# Patient Record
Sex: Female | Born: 1955
Health system: Southern US, Community
[De-identification: ages and names within clinical notes are randomized; demographics above are authoritative.]

## PROBLEM LIST (undated history)

## (undated) DIAGNOSIS — M109 Gout, unspecified: Secondary | ICD-10-CM

## (undated) DIAGNOSIS — D759 Disease of blood and blood-forming organs, unspecified: Secondary | ICD-10-CM

## (undated) DIAGNOSIS — G4730 Sleep apnea, unspecified: Secondary | ICD-10-CM

## (undated) DIAGNOSIS — M25559 Pain in unspecified hip: Secondary | ICD-10-CM

## (undated) DIAGNOSIS — N390 Urinary tract infection, site not specified: Secondary | ICD-10-CM

## (undated) DIAGNOSIS — I10 Essential (primary) hypertension: Secondary | ICD-10-CM

## (undated) DIAGNOSIS — E669 Obesity, unspecified: Secondary | ICD-10-CM

## (undated) DIAGNOSIS — E119 Type 2 diabetes mellitus without complications: Secondary | ICD-10-CM

## (undated) DIAGNOSIS — Z9989 Dependence on other enabling machines and devices: Secondary | ICD-10-CM

## (undated) DIAGNOSIS — M25569 Pain in unspecified knee: Secondary | ICD-10-CM

## (undated) DIAGNOSIS — J189 Pneumonia, unspecified organism: Secondary | ICD-10-CM

## (undated) DIAGNOSIS — M1990 Unspecified osteoarthritis, unspecified site: Secondary | ICD-10-CM

## (undated) DIAGNOSIS — T7840XA Allergy, unspecified, initial encounter: Secondary | ICD-10-CM

## (undated) DIAGNOSIS — K760 Fatty (change of) liver, not elsewhere classified: Secondary | ICD-10-CM

## (undated) HISTORY — DX: Pain in unspecified knee: M25.569

## (undated) HISTORY — DX: Allergy, unspecified, initial encounter: T78.40XA

## (undated) HISTORY — DX: Pain in unspecified hip: M25.559

## (undated) HISTORY — DX: Urinary tract infection, site not specified: N39.0

## (undated) HISTORY — PX: DILATION AND CURETTAGE OF UTERUS: SHX78

## (undated) HISTORY — DX: Essential (primary) hypertension: I10

## (undated) HISTORY — DX: Fatty (change of) liver, not elsewhere classified: K76.0

## (undated) HISTORY — DX: Type 2 diabetes mellitus without complications: E11.9

## (undated) HISTORY — DX: Dependence on other enabling machines and devices: Z99.89

## (undated) HISTORY — DX: Obesity, unspecified: E66.9

## (undated) HISTORY — DX: Gout, unspecified: M10.9

## (undated) HISTORY — DX: Unspecified osteoarthritis, unspecified site: M19.90

---

## 1975-02-07 HISTORY — PX: APPENDECTOMY: SHX54

## 1975-02-07 HISTORY — PX: CHOLECYSTECTOMY: SHX55

## 1983-02-07 HISTORY — PX: REDUCTION MAMMAPLASTY: SUR839

## 1983-02-07 HISTORY — PX: OTHER SURGICAL HISTORY: SHX169

## 2001-05-09 ENCOUNTER — Encounter: Payer: Self-pay | Admitting: Family Medicine

## 2006-05-08 ENCOUNTER — Encounter: Payer: Self-pay | Admitting: Family Medicine

## 2006-05-08 DIAGNOSIS — K760 Fatty (change of) liver, not elsewhere classified: Secondary | ICD-10-CM

## 2006-05-08 HISTORY — DX: Fatty (change of) liver, not elsewhere classified: K76.0

## 2006-05-08 LAB — CONVERTED CEMR LAB
ALT: 41 units/L
AST: 49 units/L
Hep A IgM: NEGATIVE
Hep B C IgM: NEGATIVE
Hepatitis B Surface Ag: NEGATIVE
Hgb A1c MFr Bld: 9 %

## 2006-05-24 ENCOUNTER — Encounter: Payer: Self-pay | Admitting: Family Medicine

## 2006-09-05 ENCOUNTER — Ambulatory Visit: Payer: Self-pay | Admitting: Family Medicine

## 2006-09-05 DIAGNOSIS — E7800 Pure hypercholesterolemia, unspecified: Secondary | ICD-10-CM | POA: Insufficient documentation

## 2006-09-05 DIAGNOSIS — D682 Hereditary deficiency of other clotting factors: Secondary | ICD-10-CM | POA: Insufficient documentation

## 2006-09-07 ENCOUNTER — Encounter: Payer: Self-pay | Admitting: Family Medicine

## 2006-09-07 ENCOUNTER — Telehealth (INDEPENDENT_AMBULATORY_CARE_PROVIDER_SITE_OTHER): Payer: Self-pay | Admitting: *Deleted

## 2006-09-07 LAB — CONVERTED CEMR LAB
ALT: 27 units/L (ref 0–35)
Bilirubin, Direct: 0.1 mg/dL (ref 0.0–0.3)
Cholesterol, target level: 200 mg/dL
Cholesterol: 156 mg/dL (ref 0–200)
LDL Goal: 100 mg/dL
Total CHOL/HDL Ratio: 4.3
Total Protein: 7.2 g/dL (ref 6.0–8.3)

## 2006-09-10 ENCOUNTER — Encounter: Payer: Self-pay | Admitting: Family Medicine

## 2006-09-11 ENCOUNTER — Encounter: Payer: Self-pay | Admitting: Family Medicine

## 2006-09-11 ENCOUNTER — Ambulatory Visit: Payer: Self-pay | Admitting: Internal Medicine

## 2006-09-30 ENCOUNTER — Emergency Department (HOSPITAL_COMMUNITY): Admission: EM | Admit: 2006-09-30 | Discharge: 2006-09-30 | Payer: Self-pay | Admitting: Emergency Medicine

## 2006-10-18 ENCOUNTER — Ambulatory Visit: Payer: Self-pay | Admitting: Internal Medicine

## 2006-10-18 ENCOUNTER — Encounter: Payer: Self-pay | Admitting: Family Medicine

## 2006-10-18 DIAGNOSIS — K5730 Diverticulosis of large intestine without perforation or abscess without bleeding: Secondary | ICD-10-CM | POA: Insufficient documentation

## 2006-10-18 LAB — HM COLONOSCOPY: HM Colonoscopy: NORMAL

## 2006-11-06 ENCOUNTER — Emergency Department (HOSPITAL_COMMUNITY): Admission: EM | Admit: 2006-11-06 | Discharge: 2006-11-06 | Payer: Self-pay | Admitting: Emergency Medicine

## 2006-11-12 ENCOUNTER — Encounter: Payer: Self-pay | Admitting: Family Medicine

## 2006-11-13 ENCOUNTER — Ambulatory Visit: Payer: Self-pay | Admitting: Family Medicine

## 2006-11-13 LAB — CONVERTED CEMR LAB
Glucose, Urine, Semiquant: NEGATIVE
pH: 5.5

## 2007-04-12 ENCOUNTER — Ambulatory Visit: Payer: Self-pay | Admitting: Family Medicine

## 2007-04-12 ENCOUNTER — Encounter: Admission: RE | Admit: 2007-04-12 | Discharge: 2007-04-12 | Payer: Self-pay | Admitting: Family Medicine

## 2007-04-12 LAB — CONVERTED CEMR LAB
Nitrite: POSITIVE
Protein, U semiquant: 30
Specific Gravity, Urine: 1.025
WBC Urine, dipstick: NEGATIVE

## 2007-04-12 IMAGING — MG MM DIGITAL SCREENING
5 series · 5 of 5 positions shown · non-contrast
Comparison: none

DG SCREEN MAMMOGRAM BILATERAL
Bilateral CC and MLO view(s) were taken.
Technologist: LEI

DIGITAL SCREENING MAMMOGRAM WITH CAD:
There are scattered fibroglandular densities.  No masses or malignant type calcifications are 
identified.

[R CC]
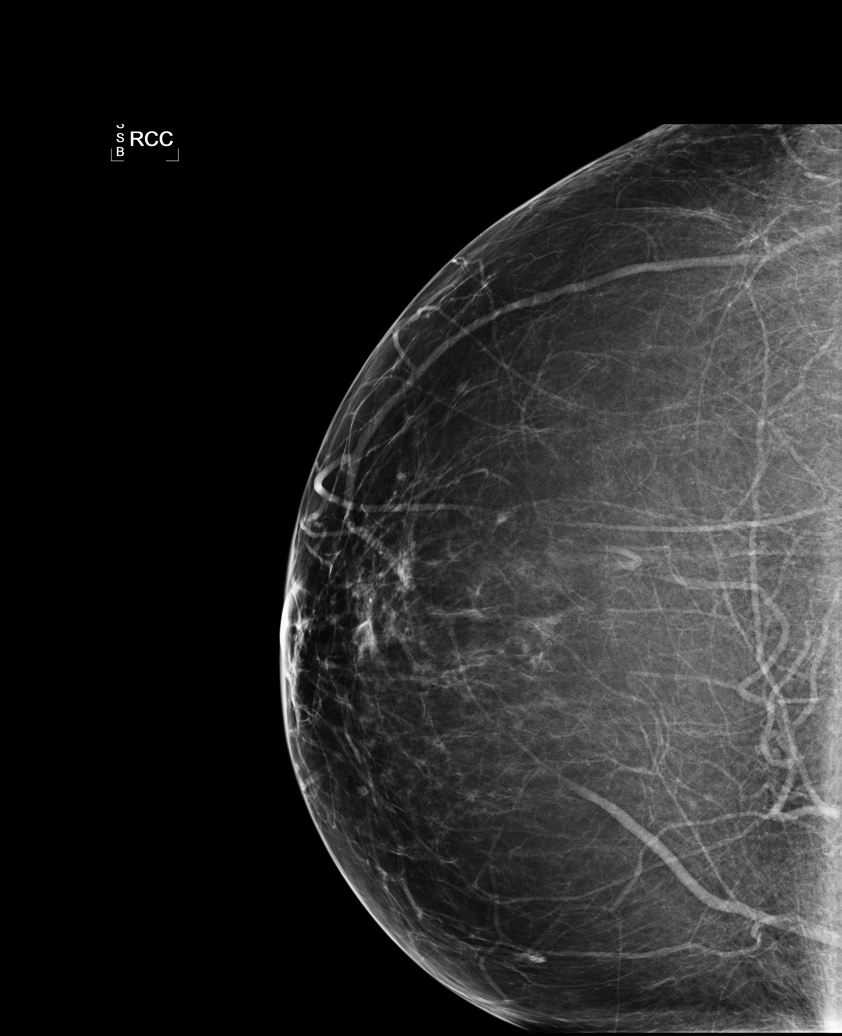

[L CC]
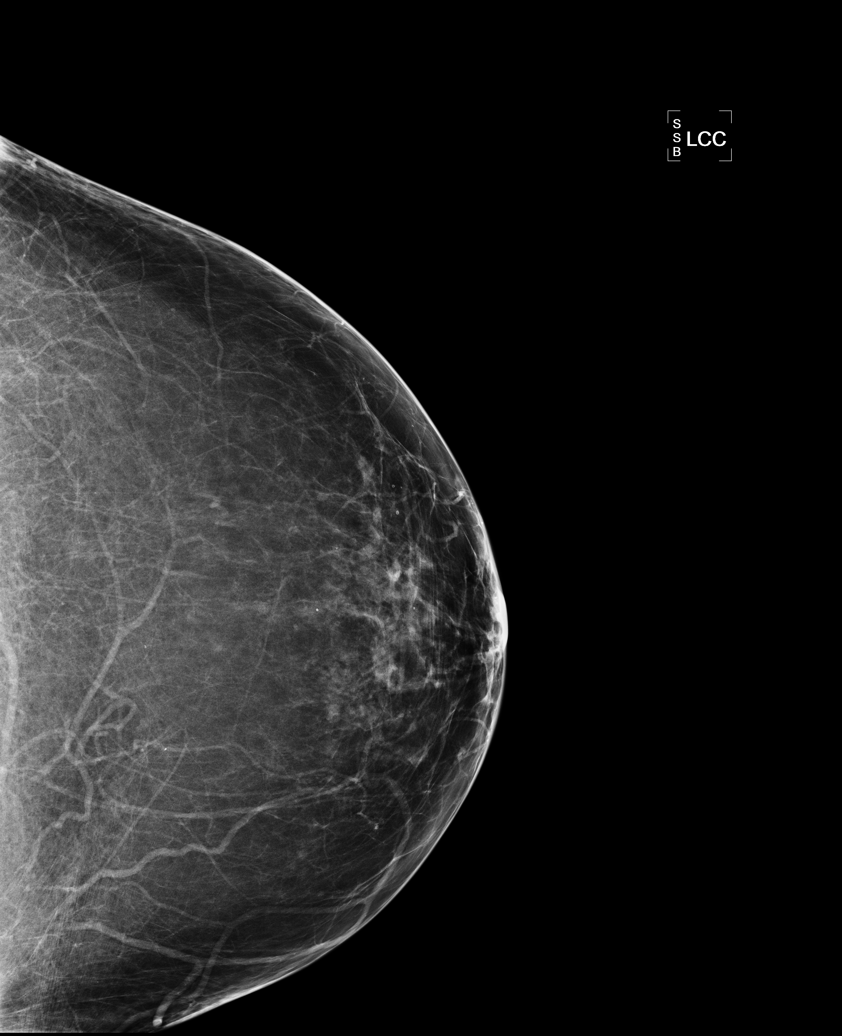

[L MLO]
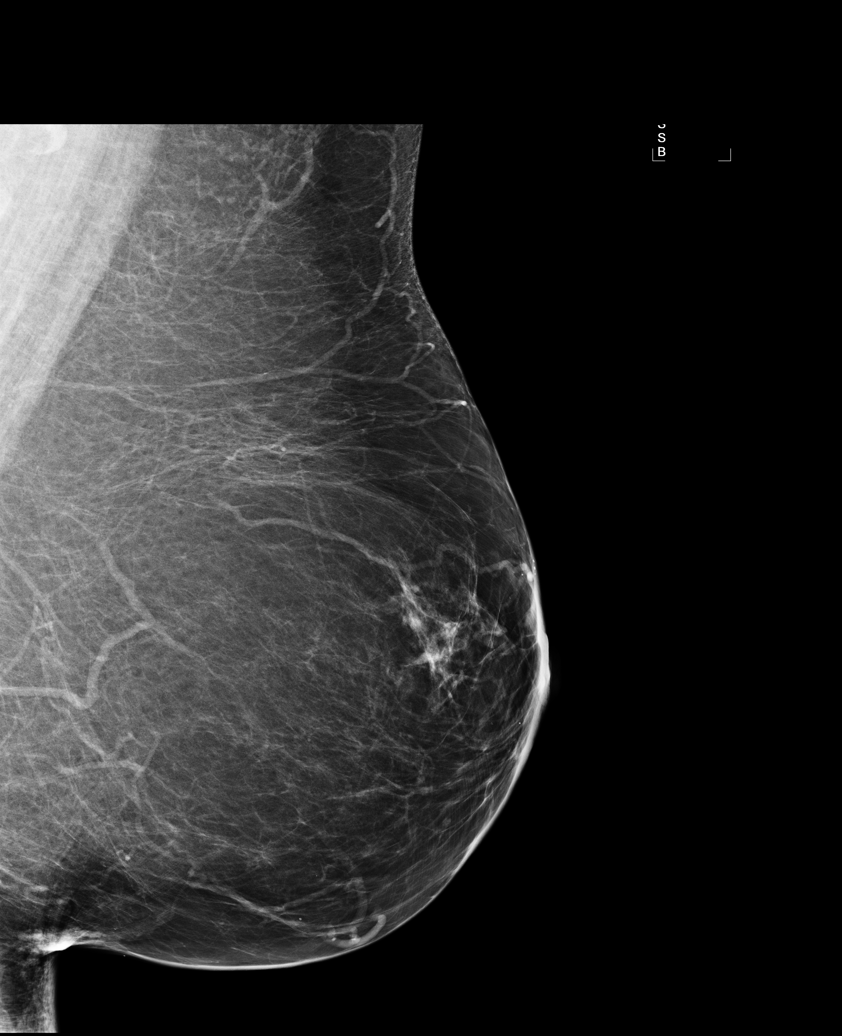

[R MLO]
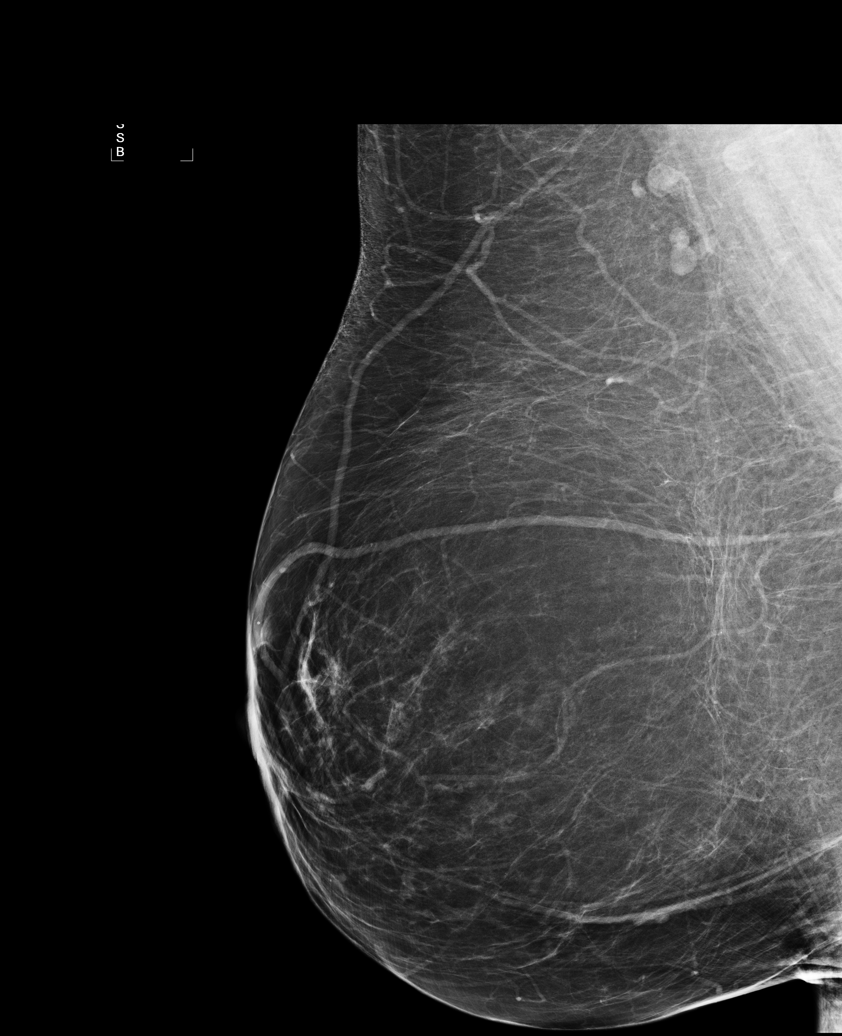

[R CV]
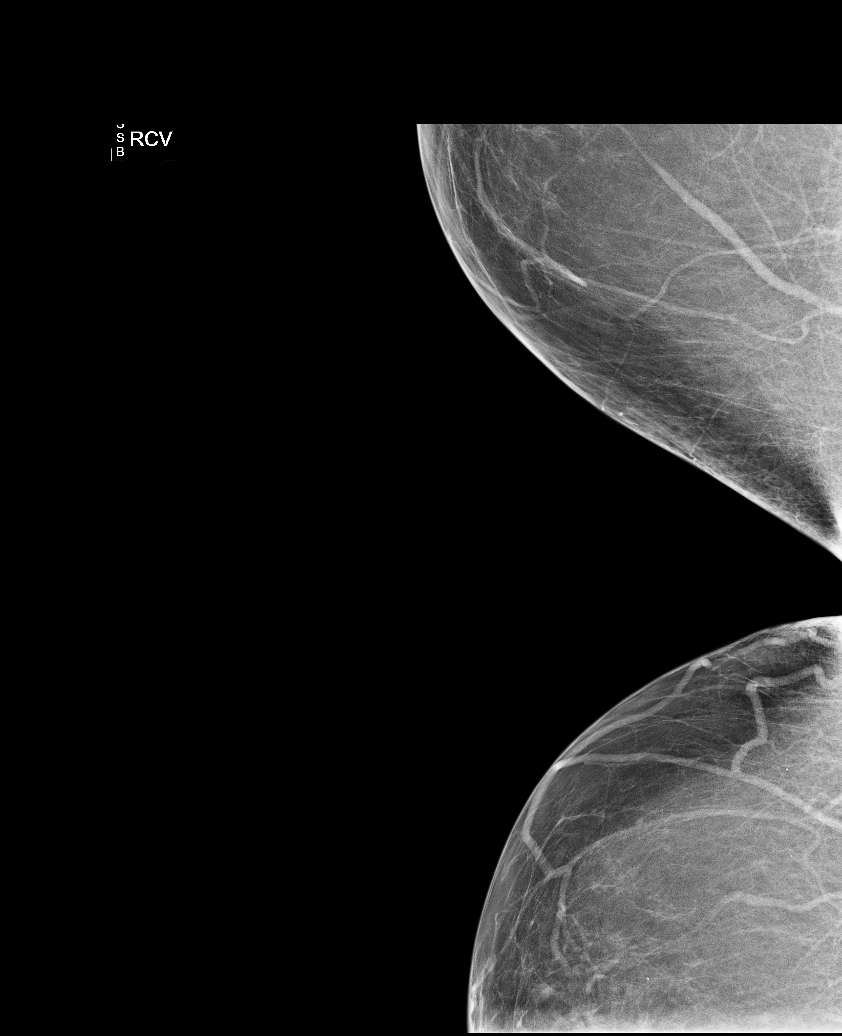

[5 of 5 positions shown; findings below may reference images not displayed]

IMPRESSION: No specific mammographic evidence of malignancy.  Next screening mammogram is recommended in one 
year.

ASSESSMENT: Negative - BI-RADS 1

Screening mammogram in 1 year.
THIS WAS ANALAYZED BY COMPUTER AIDED DETECTION. , THIS PROCEDURE WAS A DIGITAL MAMMOGRAM.

## 2007-04-15 LAB — HM MAMMOGRAPHY: HM Mammogram: NORMAL

## 2007-04-19 ENCOUNTER — Encounter: Payer: Self-pay | Admitting: Family Medicine

## 2007-04-22 LAB — CONVERTED CEMR LAB
Albumin: 4.1 g/dL (ref 3.5–5.2)
Alkaline Phosphatase: 56 units/L (ref 39–117)
CO2: 21 meq/L (ref 19–32)
Calcium: 9.4 mg/dL (ref 8.4–10.5)
Chloride: 105 meq/L (ref 96–112)
HDL: 46 mg/dL (ref >39)
LDL Cholesterol: 98 mg/dL (ref 0–99)
Sodium: 144 meq/L (ref 135–145)
Total Bilirubin: 0.5 mg/dL (ref 0.3–1.2)
Total CHOL/HDL Ratio: 3.9
Triglycerides: 185 mg/dL — ABNORMAL HIGH (ref <150)

## 2007-04-26 ENCOUNTER — Ambulatory Visit: Payer: Self-pay | Admitting: Family Medicine

## 2007-05-16 DIAGNOSIS — I10 Essential (primary) hypertension: Secondary | ICD-10-CM | POA: Insufficient documentation

## 2007-05-16 DIAGNOSIS — M129 Arthropathy, unspecified: Secondary | ICD-10-CM | POA: Insufficient documentation

## 2007-06-08 ENCOUNTER — Emergency Department (HOSPITAL_COMMUNITY): Admission: EM | Admit: 2007-06-08 | Discharge: 2007-06-08 | Payer: Self-pay | Admitting: Family Medicine

## 2007-06-14 ENCOUNTER — Encounter: Payer: Self-pay | Admitting: Family Medicine

## 2007-06-14 ENCOUNTER — Telehealth: Payer: Self-pay | Admitting: Family Medicine

## 2007-06-17 ENCOUNTER — Encounter: Payer: Self-pay | Admitting: Family Medicine

## 2007-06-17 ENCOUNTER — Telehealth (INDEPENDENT_AMBULATORY_CARE_PROVIDER_SITE_OTHER): Payer: Self-pay | Admitting: *Deleted

## 2007-06-17 LAB — CONVERTED CEMR LAB
CO2: 26 meq/L (ref 19–32)
Creatinine, Ser: 1.37 mg/dL — ABNORMAL HIGH (ref 0.40–1.20)
Glucose, Bld: 101 mg/dL — ABNORMAL HIGH (ref 70–99)
HCT: 37.2 % (ref 36.0–46.0)
MCV: 91 fL (ref 78.0–100.0)
RBC: 4.09 M/uL (ref 3.87–5.11)
Total Bilirubin: 0.5 mg/dL (ref 0.3–1.2)
Vitamin B-12: 493 pg/mL (ref 211–911)
WBC: 11.6 10*3/uL — ABNORMAL HIGH (ref 4.0–10.5)

## 2007-06-18 LAB — CONVERTED CEMR LAB
HCV Ab: NEGATIVE
Hep A IgM: NEGATIVE
Hep B C IgM: NEGATIVE
Hepatitis B Surface Ag: NEGATIVE

## 2007-07-04 ENCOUNTER — Telehealth: Payer: Self-pay | Admitting: Family Medicine

## 2007-09-12 ENCOUNTER — Encounter: Payer: Self-pay | Admitting: Family Medicine

## 2007-09-12 ENCOUNTER — Ambulatory Visit: Payer: Self-pay | Admitting: Family Medicine

## 2007-09-12 ENCOUNTER — Other Ambulatory Visit: Admission: RE | Admit: 2007-09-12 | Discharge: 2007-09-12 | Payer: Self-pay | Admitting: Family Medicine

## 2007-09-12 DIAGNOSIS — K7689 Other specified diseases of liver: Secondary | ICD-10-CM | POA: Insufficient documentation

## 2007-09-12 LAB — CONVERTED CEMR LAB
Nitrite: NEGATIVE
Specific Gravity, Urine: 1.025

## 2007-09-16 ENCOUNTER — Encounter: Payer: Self-pay | Admitting: Family Medicine

## 2007-09-20 ENCOUNTER — Encounter: Payer: Self-pay | Admitting: Family Medicine

## 2007-09-23 ENCOUNTER — Encounter: Payer: Self-pay | Admitting: Family Medicine

## 2007-10-18 ENCOUNTER — Encounter: Payer: Self-pay | Admitting: Family Medicine

## 2007-10-25 DIAGNOSIS — G4730 Sleep apnea, unspecified: Secondary | ICD-10-CM | POA: Insufficient documentation

## 2007-11-28 ENCOUNTER — Encounter: Payer: Self-pay | Admitting: Family Medicine

## 2007-12-18 ENCOUNTER — Telehealth: Payer: Self-pay | Admitting: Family Medicine

## 2008-03-03 ENCOUNTER — Ambulatory Visit: Payer: Self-pay | Admitting: Occupational Medicine

## 2008-03-03 LAB — CONVERTED CEMR LAB
Ketones, urine, test strip: NEGATIVE
Specific Gravity, Urine: 1.02

## 2008-06-18 ENCOUNTER — Ambulatory Visit: Payer: Self-pay | Admitting: Family Medicine

## 2008-07-07 ENCOUNTER — Ambulatory Visit: Payer: Self-pay | Admitting: Family Medicine

## 2008-07-08 ENCOUNTER — Encounter: Payer: Self-pay | Admitting: Family Medicine

## 2008-07-08 LAB — CONVERTED CEMR LAB
HCT: 39 % (ref 36.0–46.0)
MCV: 89 fL (ref 78.0–100.0)
Platelets: 256 10*3/uL (ref 150–400)
WBC: 13.1 10*3/uL — ABNORMAL HIGH (ref 4.0–10.5)

## 2008-09-11 ENCOUNTER — Telehealth (INDEPENDENT_AMBULATORY_CARE_PROVIDER_SITE_OTHER): Payer: Self-pay | Admitting: *Deleted

## 2008-12-07 ENCOUNTER — Ambulatory Visit: Payer: Self-pay | Admitting: Family Medicine

## 2008-12-07 LAB — CONVERTED CEMR LAB
Creatinine,U: 200 mg/dL
Microalbumin U total vol: 30 mg/L

## 2008-12-11 LAB — CONVERTED CEMR LAB
ALT: 60 units/L — ABNORMAL HIGH (ref 0–35)
AST: 62 units/L — ABNORMAL HIGH (ref 0–37)
Albumin: 4.4 g/dL (ref 3.5–5.2)
Alkaline Phosphatase: 55 units/L (ref 39–117)
BUN: 20 mg/dL (ref 6–23)
CO2: 22 meq/L (ref 19–32)
Calcium: 10.4 mg/dL (ref 8.4–10.5)
Chloride: 105 meq/L (ref 96–112)
Cholesterol: 179 mg/dL (ref 0–200)
Creatinine, Ser: 1.04 mg/dL (ref 0.40–1.20)
Glucose, Bld: 147 mg/dL — ABNORMAL HIGH (ref 70–99)
HDL: 43 mg/dL (ref >39)
LDL Cholesterol: 78 mg/dL (ref 0–99)
Potassium: 4.6 meq/L (ref 3.5–5.3)
Sodium: 141 meq/L (ref 135–145)
Total Bilirubin: 0.3 mg/dL (ref 0.3–1.2)
Total CHOL/HDL Ratio: 4.2
Total Protein: 7.4 g/dL (ref 6.0–8.3)
Triglycerides: 292 mg/dL — ABNORMAL HIGH (ref <150)
VLDL: 58 mg/dL — ABNORMAL HIGH (ref 0–40)

## 2009-02-08 ENCOUNTER — Telehealth: Payer: Self-pay | Admitting: Family Medicine

## 2009-02-15 ENCOUNTER — Encounter: Payer: Self-pay | Admitting: Family Medicine

## 2009-02-15 LAB — HM DIABETES EYE EXAM: HM Diabetic Eye Exam: NORMAL

## 2009-04-01 ENCOUNTER — Ambulatory Visit: Payer: Self-pay | Admitting: Family Medicine

## 2009-04-01 LAB — CONVERTED CEMR LAB: Hgb A1c MFr Bld: 7.4 %

## 2009-05-04 ENCOUNTER — Telehealth: Payer: Self-pay | Admitting: Family Medicine

## 2009-05-27 ENCOUNTER — Telehealth: Payer: Self-pay | Admitting: Family Medicine

## 2009-06-21 ENCOUNTER — Ambulatory Visit: Payer: Self-pay | Admitting: Family Medicine

## 2009-06-21 DIAGNOSIS — K5900 Constipation, unspecified: Secondary | ICD-10-CM | POA: Insufficient documentation

## 2009-06-21 LAB — HM DIABETES FOOT EXAM

## 2009-07-17 ENCOUNTER — Ambulatory Visit: Payer: Self-pay | Admitting: Family Medicine

## 2009-07-17 DIAGNOSIS — R252 Cramp and spasm: Secondary | ICD-10-CM | POA: Insufficient documentation

## 2009-07-17 DIAGNOSIS — N3000 Acute cystitis without hematuria: Secondary | ICD-10-CM | POA: Insufficient documentation

## 2009-07-17 LAB — CONVERTED CEMR LAB
Calcium: 10 mg/dL (ref 8.4–10.5)
Glucose, Urine, Semiquant: NEGATIVE
Nitrite: POSITIVE
Potassium: 4.7 meq/L (ref 3.5–5.3)
Sodium: 140 meq/L (ref 135–145)
TSH: 1.364 microintl units/mL (ref 0.350–4.500)
Urobilinogen, UA: 1

## 2009-07-19 ENCOUNTER — Encounter: Payer: Self-pay | Admitting: Family Medicine

## 2009-10-26 ENCOUNTER — Ambulatory Visit: Payer: Self-pay | Admitting: Family Medicine

## 2009-10-26 LAB — CONVERTED CEMR LAB
Bilirubin Urine: NEGATIVE
Glucose, Urine, Semiquant: NEGATIVE
Nitrite: POSITIVE
Urobilinogen, UA: 0.2
pH: 5

## 2009-10-28 ENCOUNTER — Encounter: Payer: Self-pay | Admitting: Family Medicine

## 2009-11-11 ENCOUNTER — Ambulatory Visit: Payer: Self-pay | Admitting: Family Medicine

## 2009-11-11 LAB — CONVERTED CEMR LAB
Bilirubin Urine: NEGATIVE
Blood in Urine, dipstick: NEGATIVE
Nitrite: NEGATIVE
Specific Gravity, Urine: 1.025
pH: 5

## 2009-11-19 ENCOUNTER — Encounter: Payer: Self-pay | Admitting: Family Medicine

## 2010-01-05 ENCOUNTER — Encounter: Payer: Self-pay | Admitting: Family Medicine

## 2010-03-09 NOTE — Assessment & Plan Note (Signed)
Summary: FREQUENT URINATION/TJ   Vital Signs:  Patient Profile:   55 Years Old Female CC:      Polyuria x 2 weeks worse last 2 days, constipation x 1 month Height:     68 inches Weight:      280 pounds O2 Sat:      95 % O2 treatment:    Room Air Temp:     97.9 degrees F oral Pulse rate:   81 / minute Pulse rhythm:   regular Resp:     18 per minute BP sitting:   125 / 70  (right arm) Cuff size:   large  Vitals Entered By: Emilio Math (July 17, 2009 12:40 PM)                  Current Allergies (reviewed today): ! PCN ! MORPHINE ! SULFAHistory of Present Illness Chief Complaint: Polyuria x 2 weeks worse last 2 days, constipation x 1 month History of Present Illness: Subjective:  Patient presents with three complaints: 1)  Increased urinary urgency, frequency for one month now worse for one week with development of nocturia.  She has also developed a mild low back ache and lower abdominal pressure.  No fevers, chills, and sweats.   2)  She complains of constipation for about a month, with stools harder and less frequent despite taking Colace.  She had a negative colonoscopy about 2 years ago. 3)  Recurring lower leg cramps for about 2 weeks.  No pain with walking.  Current Meds METFORMIN HCL 1000 MG TABS (METFORMIN HCL) Take 1 tablet by mouth two times a day CELEBREX 200 MG  CAPS (CELECOXIB) Take one tablet by mouth once aday AMLODIPINE BESYLATE 5 MG TABS (AMLODIPINE BESYLATE) Take 1 tablet by mouth once a day HYDROCHLOROTHIAZIDE 25 MG  TABS (HYDROCHLOROTHIAZIDE) Tak eone tablet by mouth once a day LANTUS 100 UNIT/ML  SOLN (INSULIN GLARGINE) Inject 80  units Subcutaneously in the evenings ASPIRIN 325 MG  TBEC (ASPIRIN) Take one tablet by mouth once a day * INSULIN SYRINGES 100UNITS SIZE, TEST STRIPS, LANCETS, NEEDLE Uses Freestyle machine Tests 3x a day ALLOPURINOL 100 MG TABS (ALLOPURINOL) Take 1 tablet by mouth once a day COLCHICINE 0.6 MG TABS (COLCHICINE) 2 tabs by mouth  at onset of pain, then one tabs 1 hour later, then daily until gout attack resolves. TRICOR 145 MG TABS (FENOFIBRATE) Take 1 tablet by mouth once a day at bedtime CENTRUM SILVER ULTRA WOMENS  TABS (MULTIPLE VITAMINS-MINERALS) take one tab by mouth once daily BENAZEPRIL HCL 20 MG TABS (BENAZEPRIL HCL) Take 1 tablet by mouth once a day ONGLYZA 5 MG TABS (SAXAGLIPTIN HCL) Take 1 tablet by mouth once a day * TEST STRIPS FOR GLUCOMETER. Dx 250.00 Tests two times a day MACROBID 100 MG CAPS (NITROFURANTOIN MONOHYD MACRO) 1 by mouth q12hr with food  REVIEW OF SYSTEMS Constitutional Symptoms      Denies fever, chills, night sweats, weight loss, weight gain, and fatigue.  Eyes       Denies change in vision, eye pain, eye discharge, glasses, contact lenses, and eye surgery. Ear/Nose/Throat/Mouth       Denies hearing loss/aids, change in hearing, ear pain, ear discharge, dizziness, frequent runny nose, frequent nose bleeds, sinus problems, sore throat, hoarseness, and tooth pain or bleeding.  Respiratory       Denies dry cough, productive cough, wheezing, shortness of breath, asthma, bronchitis, and emphysema/COPD.  Cardiovascular       Denies murmurs, chest pain, and  tires easily with exhertion.    Gastrointestinal       Complains of constipation.      Denies stomach pain, nausea/vomiting, diarrhea, blood in bowel movements, and indigestion. Genitourniary       Complains of painful urination.      Denies kidney stones and loss of urinary control. Neurological       Denies paralysis, seizures, and fainting/blackouts. Musculoskeletal       Denies muscle pain, joint pain, joint stiffness, decreased range of motion, redness, swelling, muscle weakness, and gout.  Skin       Denies bruising, unusual mles/lumps or sores, and hair/skin or nail changes.  Psych       Denies mood changes, temper/anger issues, anxiety/stress, speech problems, depression, and sleep problems.  Past History:  Past Medical  History: Reviewed history from 06/21/2009 and no changes required. Recurrent UTI Fatty Liver by [place] on 05-2006 Factor V Leiden: single mutation [id number] mutation identified (heterozygote) 05-16-06 CPAP 8 cm water with heated humidifier.  Obese Knee/ hip pain  Past Surgical History: Reviewed history from 05/16/2007 and no changes required. reduction mammoplasty (1985) D & C (1987, 1988) c/sec (1989) Cholecystectomy (1977) Appendectomy (1977)  Family History: Reviewed history from 12/07/2008 and no changes required. MGF alcoholism Father MI, HTN, arthiritis and parkinson's, gout.  Mother DM< HTN, Factor V Leiden & Lynnell Chad, partial mastectomy for breast Ca in her 85s  Social History: Reviewed history from 09/12/2007 and no changes required. 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  Married to [place] with 5 kids (1 set of twins).  Mother lives in [place], [place] Former Smoker Alcohol use-no Drug use-no Regular exercise-no   Objective:  Obese middle aged female in no distress Mouth:  moist mucous membranes  Neck:  No adenopathy or thyromegaly Lungs:  Clear to auscultation.  Breath sounds are equal.  Heart:  Regular rate and rhythm without murmurs, rubs, or gallops.  Abdomen:  Nontender without masses or hepatosplenomegaly.  Bowel sounds are present.  No CVA or flank tenderness.  urinalysis (dipstick):  small blood, positive nit, large leuks Assessment New Problems: LEG CRAMPS (ICD-729.82) CONSTIPATION, CHRONIC (ICD-564.09) ACUTE CYSTITIS (ICD-595.0)   Plan New Medications/Changes: MACROBID 100 MG CAPS (NITROFURANTOIN MONOHYD MACRO) 1 by mouth q12hr with food  #14 x 0, 07/17/2009, Donna Christen MD  New Orders: Urinalysis [81003-65000] T-Culture, Urine [[id number]] T-Basic Metabolic Panel [[id number]] T-TSH [[id number]] Est. Patient Level IV [10272] Planning Comments:   Urine culture pending.  Begin Macrobid. For history of constipation, check TSH.  Recommend adding  fiber product such as Citrucel. For history of leg cramps, check BMP Follow-up with PCP   The patient and/or caregiver has been counseled thoroughly with regard to medications prescribed including dosage, schedule, interactions, rationale for use, and possible side effects and they verbalize understanding.  Diagnoses and expected course of recovery discussed and will return if not improved as expected or if the condition worsens. Patient and/or caregiver verbalized understanding.  Prescriptions: MACROBID 100 MG CAPS (NITROFURANTOIN MONOHYD MACRO) 1 by mouth q12hr with food  #14 x 0   Entered and Authorized by:   Donna Christen MD   Signed by:   Donna Christen MD on 07/17/2009   Method used:   Print then Give to Patient   RxID:   [id number]   Orders Added: 1)  Urinalysis [81003-65000] 2)  T-Culture, Urine [[id number]] 3)  T-Basic Metabolic Panel [[id number]] 4)  T-TSH [30160-10932] 5)  Est. Patient Level IV [35573]  Laboratory Results   Urine  Tests  Date/Time Received: July 17, 2009 12:50 PM  Date/Time Reported: July 17, 2009 12:50 PM   Routine Urinalysis   Color: orange Appearance: Hazy Glucose: negative   (Normal Range: Negative) Bilirubin: negative   (Normal Range: Negative) Ketone: negative   (Normal Range: Negative) Spec. Gravity: 1.015   (Normal Range: 1.003-1.035) Blood: small   (Normal Range: Negative) pH: 5.0   (Normal Range: 5.0-8.0) Protein: 30   (Normal Range: Negative) Urobilinogen: 1.0   (Normal Range: 0-1) Nitrite: positive   (Normal Range: Negative) Leukocyte Esterace: large   (Normal Range: Negative)

## 2010-03-09 NOTE — Assessment & Plan Note (Signed)
Summary: FU UTI, constipation   Vital Signs:  Patient profile:   55 year old female Height:      68 inches Weight:      273 pounds Pulse rate:   83 / minute BP sitting:   123 / 68  (right arm) Cuff size:   large  Vitals Entered By: Avon Gully CMA, Duncan Dull) (November 11, 2009 2:18 PM) CC: f/u still hasd some constipation issues,still has uti sx   Primary Care [place] Pruden:  Nani Gasser MD  CC:  f/u still hasd some constipation issues and still has uti sx.  History of Present Illness: f/u still hasd some constipation issues,still has uti signs. Still noticing some dysuria, and frequency. Did feel better for a few days on the Cipro. Still having urinary incontinence. Says always feels like has a low grade UTI.  Also notices that if leans in different directions while urinating that she will actually empty more urine.  Using miralax two times a day and still having BM every 3 days but not impacted. Stools are moving much more easily.   Current Medications (verified): 1)  Metformin Hcl 1000 Mg Tabs (Metformin Hcl) .... Take 1 Tablet By Mouth Two Times A Day 2)  Celebrex 200 Mg  Caps (Celecoxib) .... Take One Tablet By Mouth Once Aday 3)  Amlodipine Besylate 5 Mg Tabs (Amlodipine Besylate) .... Take 1 Tablet By Mouth Once A Day 4)  Hydrochlorothiazide 25 Mg  Tabs (Hydrochlorothiazide) .... Tak Eone Tablet By Mouth Once A Day 5)  Lantus 100 Unit/ml  Soln (Insulin Glargine) .... Inject 80  Units Subcutaneously in The Evenings 6)  Aspirin 325 Mg  Tbec (Aspirin) .... Take One Tablet By Mouth Once A Day 7)  Insulin Syringes 100units Size, Test Strips, Lancets, Needle .... Uses Freestyle Machine Tests 3x A Day 8)  Allopurinol 100 Mg Tabs (Allopurinol) .... Take 1 Tablet By Mouth Once A Day 9)  Colchicine 0.6 Mg Tabs (Colchicine) .... 2 Tabs By Mouth At Onset of Pain, Then One Tabs 1 Hour Later, Then Daily Until Gout Attack Resolves. 10)  Tricor 145 Mg Tabs (Fenofibrate) .... Take 1 Tablet  By Mouth Once A Day At Bedtime 11)  Centrum Silver Ultra Womens  Tabs (Multiple Vitamins-Minerals) .... Take One Tab By Mouth Once Daily 12)  Benazepril Hcl 20 Mg Tabs (Benazepril Hcl) .... Take 1 Tablet By Mouth Once A Day 13)  Onglyza 5 Mg Tabs (Saxagliptin Hcl) .... Take 1 Tablet By Mouth Once A Day 14)  Test Strips For Glucometer. .... Dx 250.00 Tests Two Times A Day  Allergies (verified): 1)  ! Pcn 2)  ! Morphine 3)  ! Sulfa  Comments:  Nurse/Medical Assistant: The patient's medications and allergies were reviewed with the patient and were updated in the Medication and Allergy Lists. Avon Gully CMA, Duncan Dull) (November 11, 2009 2:22 PM)  Physical Exam  General:  Well-developed,well-nourished,in no acute distress; alert,appropriate and cooperative throughout examination   Impression & Recommendations:  Problem # 1:  ACUTE CYSTITIS (ICD-595.0)  UA today looks good. Will refer to Urology for further evaluation of her recurrent infection and for her incontinence. She may have prolapse which may be contributing to her sxs.   The following medications were removed from the medication list:    Cipro 500 Mg Tabs (Ciprofloxacin hcl) .Marland Kitchen... Take 1 tablet by mouth two times a day for 3 days.  Orders: UA Dipstick w/o Micro (automated)  (81003)  Problem # 2:  CONSTIPATION,  MILD (ICD-564.00) Continue the miralax. The trilipix certainly makes her constipation worse so could consider going to [place]. She says she has taken it in the past. Her highest TG with me is 437, not over 500. Though she thinks in the past her TG have been over 500. This is important for insurance to cover the med. Conisder chaning in Jan to [place] and see if better coverage.   Complete Medication List: 1)  Metformin Hcl 1000 Mg Tabs (Metformin hcl) .... Take 1 tablet by mouth two times a day 2)  Celebrex 200 Mg Caps (Celecoxib) .... Take one tablet by mouth once aday 3)  Amlodipine Besylate 5 Mg Tabs (Amlodipine  besylate) .... Take 1 tablet by mouth once a day 4)  Hydrochlorothiazide 25 Mg Tabs (Hydrochlorothiazide) .... Tak eone tablet by mouth once a day 5)  Lantus 100 Unit/ml Soln (Insulin glargine) .... Inject 80  units subcutaneously in the evenings 6)  Aspirin 325 Mg Tbec (Aspirin) .... Take one tablet by mouth once a day 7)  Insulin Syringes 100units Size, Test Strips, Lancets, Needle  .... Uses freestyle machine tests 3x a day 8)  Allopurinol 100 Mg Tabs (Allopurinol) .... Take 1 tablet by mouth once a day 9)  Colchicine 0.6 Mg Tabs (Colchicine) .... 2 tabs by mouth at onset of pain, then one tabs 1 hour later, then daily until gout attack resolves. 10)  Tricor 145 Mg Tabs (Fenofibrate) .... Take 1 tablet by mouth once a day at bedtime 11)  Centrum Silver Ultra Womens Tabs (Multiple vitamins-minerals) .... Take one tab by mouth once daily 12)  Benazepril Hcl 20 Mg Tabs (Benazepril hcl) .... Take 1 tablet by mouth once a day 13)  Onglyza 5 Mg Tabs (Saxagliptin hcl) .... Take 1 tablet by mouth once a day 14)  Test Strips For Glucometer.  .... Dx 250.00 tests two times a day  Other Orders: Urology Referral (Urology)  Patient Instructions: 1)  We will call you with the Urology referral.   Laboratory Results   Urine Tests  Date/Time Received: 11/11/09 Date/Time Reported: 11/11/09  Routine Urinalysis   Color: yellow Appearance: Clear Glucose: negative   (Normal Range: Negative) Bilirubin: negative   (Normal Range: Negative) Ketone: negative   (Normal Range: Negative) Spec. Gravity: 1.025   (Normal Range: 1.003-1.035) Blood: negative   (Normal Range: Negative) pH: 5.0   (Normal Range: 5.0-8.0) Protein: negative   (Normal Range: Negative) Urobilinogen: 0.2   (Normal Range: 0-1) Nitrite: negative   (Normal Range: Negative) Leukocyte Esterace: negative   (Normal Range: Negative)

## 2010-03-09 NOTE — Progress Notes (Signed)
Summary: [employer] fast weight loss program  Phone Note Call from Patient Call back at Home Phone [phone]   Caller: Patient Call For: Nani Gasser MD Summary of Call: Pt called and said she wanted to do the [employer] fast weight loss program for 4 months just to jump start then go over to weight watchers. [place] if doctor recommends it thinks insurance will help pay for the program. Plans to start in April and has appt with you in February and wanted to know if you would do this for her Initial call taken by: Kathlene November,  February 08, 2009 12:41 PM  Follow-up for Phone Call        That sounds good. I will be happy to do her Exam for [facility] if she brings in her paperwork. As far as insuranc... I really don't know. She will have to call her plan for specifics on coverage.  Follow-up by: Nani Gasser MD,  February 08, 2009 12:52 PM  Additional Follow-up for Phone Call Additional follow up Details #1::        Pt notified of above  Additional Follow-up by: Kathlene November,  February 08, 2009 1:19 PM

## 2010-03-09 NOTE — Assessment & Plan Note (Signed)
Summary: 4 mo. f/u DM, constipation, bursitis   Vital Signs:  Patient profile:   55 year old female Height:      68 inches Weight:      273 pounds Pulse rate:   87 / minute BP sitting:   128 / 79  (right arm) Cuff size:   large  Vitals Entered By: Avon Gully CMA, Duncan Dull) (October 26, 2009 4:47 PM) CC: f/u DM, UTI sx   Primary Care Provider:  Nani Gasser MD  CC:  f/u DM and UTI sx.  History of Present Illness: Has lost about 7 lbs. Working long hours.   Hx of bursitis and put on celebrex a few years ago. She is still taking that and does feel it helps, but now having pain in her right outer hip. Feels like a knife poking in that area that is worse wiht going up steps. BEtter at rest. Has been applying heat.  Says she knows her weight is contributing.   Feels has a chronic UTI. Going frequently. Waking up every 2 hours at night to uriante. + dysuria. No hematuria. .    Used to have more loose stools. Lately has had more chronic constipation for several months.  Started since she begain the trilipix and the onglyza.  Occ having to disimpact herself. Using 3 colace a day ans still having to strain.  Feels her Memory Argue sxs have been worse she has been constipated.    Diabetes Management History:      The patient is a 55 years old female who comes in for evaluation of DM Type 2.  She is (or has been) enrolled in the "Diabetic Education Program".  She is not checking home blood sugars.  She says that she is not exercising regularly.    Allergies: 1)  ! Pcn 2)  ! Morphine 3)  ! Sulfa  Family History: MGF alcoholism Father MI, HTN, arthiritis and parkinson's, gout.  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  Married to [place] with 5 kids (1 set of twins).  Mother lives in [place], [place] Former Smoker Alcohol use-no Drug use-no Regular  exercise-no  Physical Exam  General:  Well-developed,well-nourished,in no acute distress; alert,appropriate and cooperative throughout examination Head:  Normocephalic and atraumatic without obvious abnormalities. No apparent alopecia or balding. Lungs:  Normal respiratory effort, chest expands symmetrically. Lungs are clear to auscultation, no crackles or wheezes. Heart:  Normal rate and regular rhythm. S1 and S2 normal without gallop, murmur, click, rub or other extra sounds. Abdomen:  Bowel sounds positive,abdomen soft and non-tender without masses, organomegaly or hernias noted. Msk:  Right hip with NROM. Tender over teh greater trochanter.  Strength 5/5.  Extremities:  No LE edema.  Neurologic:  alert & oriented X3.   Skin:  no rashes.   Psych:  Cognition and judgment appear intact. Alert and cooperative with normal attention span and concentration. No apparent delusions, illusions, hallucinations   Impression & Recommendations:  Problem # 1:  DIABETES-TYPE 2 (ICD-250.00) Assessment Improved Improved. Looks gret today. BP well controlled. Tolerating the onglyza well.   Congratulated her on her weight loss. Strongly encourage her to start an exercise routine.  Will get flu vac at work.  Her updated medication list for this problem includes:    Metformin Hcl 1000 Mg Tabs (Metformin hcl) .Marland Kitchen... Take 1 tablet by mouth two times a day  Lantus 100 Unit/ml Soln (Insulin glargine) ..... Inject 80  units subcutaneously in the evenings    Aspirin 325 Mg Tbec (Aspirin) .Marland Kitchen... Take one tablet by mouth once a day    Benazepril Hcl 20 Mg Tabs (Benazepril hcl) .Marland Kitchen... Take 1 tablet by mouth once a day    Onglyza 5 Mg Tabs (Saxagliptin hcl) .Marland Kitchen... Take 1 tablet by mouth once a day  Labs Reviewed: Creat: 1.43 (07/17/2009)   Microalbumin: 30 (10/26/2009)  Last Eye Exam: normal (02/15/2009) Reviewed HgBA1c results: 7.4 (04/01/2009)  6.9 (09/12/2007)  Problem # 2:  CONSTIPATION, MILD  (ICD-564.00) Likely from teh trilipix. Add miralax to her regime two times a day adn then dec to once a day once BMs are consistant.  With weight loss may be able to discontinue the trilipix.    Problem # 3:  ACUTE CYSTITIS (ICD-595.0) UA looks +. will tx with cipro adn send a culture.  Will call with results. Consider possible dx of IC. Want to see if any improvement of her sxs as her constipatoin improves.  If not will refer to Urology for IC evaluation.  The following medications were removed from the medication list:    Macrobid 100 Mg Caps (Nitrofurantoin monohyd macro) .Marland Kitchen... 1 by mouth q12hr with food Her updated medication list for this problem includes:    Cipro 500 Mg Tabs (Ciprofloxacin hcl) .Marland Kitchen... Take 1 tablet by mouth two times a day for 3 days.  Orders: Creatinine  (16109) UA Dipstick w/o Micro (manual) (81002) Urine Microalbumin (60454) T-Urine Culture (Spectrum Order) [phone])  Problem # 4:  HYPERTENSION (ICD-401.9) Assessment: Improved  Her updated medication list for this problem includes:    Amlodipine Besylate 5 Mg Tabs (Amlodipine besylate) .Marland Kitchen... Take 1 tablet by mouth once a day    Hydrochlorothiazide 25 Mg Tabs (Hydrochlorothiazide) .Marland Kitchen... Tak eone tablet by mouth once a day    Benazepril Hcl 20 Mg Tabs (Benazepril hcl) .Marland Kitchen... Take 1 tablet by mouth once a day  BP today: 128/79 Prior BP: 125/70 (07/17/2009)  Prior 10 Yr Risk Heart Disease: Not enough information (09/07/2006)  Labs Reviewed: K+: 4.7 (07/17/2009) Creat: : 1.43 (07/17/2009)   Chol: 179 (12/07/2008)   HDL: 43 (12/07/2008)   LDL: 78 (12/07/2008)   TG: 292 (12/07/2008)  Problem # 5:  TROCHANTERIC BURSITIS (ICD-726.5) Discussed dx.  H.O given on trochanteric bursitis.  ICe the area. If nto improving in the next 3 weeks then consider PT or an injection.   Complete Medication List: 1)  Metformin Hcl 1000 Mg Tabs (Metformin hcl) .... Take 1 tablet by mouth two times a day 2)  Celebrex 200 Mg  Caps (Celecoxib) .... Take one tablet by mouth once aday 3)  Amlodipine Besylate 5 Mg Tabs (Amlodipine besylate) .... Take 1 tablet by mouth once a day 4)  Hydrochlorothiazide 25 Mg Tabs (Hydrochlorothiazide) .... Tak eone tablet by mouth once a day 5)  Lantus 100 Unit/ml Soln (Insulin glargine) .... Inject 80  units subcutaneously in the evenings 6)  Aspirin 325 Mg Tbec (Aspirin) .... Take one tablet by mouth once a day 7)  Insulin Syringes 100units Size, Test Strips, Lancets, Needle  .... Uses freestyle machine tests 3x a day 8)  Allopurinol 100 Mg Tabs (Allopurinol) .... Take 1 tablet by mouth once a day 9)  Colchicine 0.6 Mg Tabs (Colchicine) .... 2 tabs by mouth at onset of pain, then one tabs 1 hour later, then daily until gout attack resolves. 10)  Tricor 145 Mg Tabs (  Fenofibrate) .... Take 1 tablet by mouth once a day at bedtime 11)  Centrum Silver Ultra Womens Tabs (Multiple vitamins-minerals) .... Take one tab by mouth once daily 12)  Benazepril Hcl 20 Mg Tabs (Benazepril hcl) .... Take 1 tablet by mouth once a day 13)  Onglyza 5 Mg Tabs (Saxagliptin hcl) .... Take 1 tablet by mouth once a day 14)  Test Strips For Glucometer.  .... Dx 250.00 tests two times a day 15)  Cipro 500 Mg Tabs (Ciprofloxacin hcl) .... Take 1 tablet by mouth two times a day for 3 days.  Diabetes Management Assessment/Plan:      The following lipid goals have been established for the patient: Total cholesterol goal of 200; LDL cholesterol goal of 100; HDL cholesterol goal of 40; Triglyceride goal of 150.  Her blood pressure goal is < 130/80.    Contraindications/Deferment of Procedures/Staging:    Test/Procedure: FLU VAX    Reason for deferment: patient declined   Patient Instructions: 1)  Miralax two times a day and then drop to once day once getting normal bowel movements.  2)  Please schedule a follow-up appointment in 2-3 weeks.  Prescriptions: CIPRO 500 MG TABS (CIPROFLOXACIN HCL) Take 1 tablet by  mouth two times a day for 3 days.  #6 x 0   Entered and Authorized by:   Nani Gasser MD   Signed by:   Nani Gasser MD on 10/26/2009   Method used:   Electronically to        [facility]* (retail)       [address] [place], [place]  [address]       Ph: [phone]       Fax: [phone]   RxID:   [id number]   Laboratory Results   Urine Tests  Date/Time Received: 10/26/09 Date/Time Reported: 10/26/09  Routine Urinalysis   Color: yellow Appearance: Clear Glucose: negative   (Normal Range: Negative) Bilirubin: negative   (Normal Range: Negative) Ketone: negative   (Normal Range: Negative) Spec. Gravity: 1.025   (Normal Range: 1.003-1.035) Blood: moderate   (Normal Range: Negative) pH: 5.0   (Normal Range: 5.0-8.0) Protein: negative   (Normal Range: Negative) Urobilinogen: 0.2   (Normal Range: 0-1) Nitrite: positive   (Normal Range: Negative) Leukocyte Esterace: large   (Normal Range: Negative)  Microalbumin (urine): 30 mg/L Creatinine: 300mg /dL  A:C Ratio 30mg /g

## 2010-03-09 NOTE — Letter (Signed)
Summary: [facility]  [facility]   Imported By: Lanelle Bal 03/06/2009 11:18:30  _____________________________________________________________________  External Attachment:    Type:   Image     Comment:   External Document

## 2010-03-09 NOTE — Progress Notes (Signed)
Summary: FBS readings and med refill  Phone Note Call from Patient Call back at Home Phone [phone]   Caller: Patient Call For: Amanda Gasser MD Summary of Call: Pt calls and given samples of Ongliza at last office visit. FBS have been in range of 124-135- pt doesn't feel it has helped much but either needs rx called into [facility] or if you want to change send the changed med and let her know Initial call taken by: Kathlene November,  May 04, 2009 9:53 AM  Follow-up for Phone Call        Lets stay on this for now. Rx sent to pharm. REally work on exercise and diet until f/u.  Follow-up by: Amanda Gasser MD,  May 04, 2009 11:20 AM    New/Updated Medications: ONGLYZA 5 MG TABS (SAXAGLIPTIN HCL) Take 1 tablet by mouth once a day Prescriptions: ONGLYZA 5 MG TABS (SAXAGLIPTIN HCL) Take 1 tablet by mouth once a day  #90 x 1   Entered by:   Kathlene November   Authorized by:   Amanda Gasser MD   Signed by:   Kathlene November on 05/04/2009   Method used:   Electronically to        [facility]* (retail)       [address]       [address] Shipping/mailing       [place], [place]  [address]       Ph: [phone]       Fax: [phone]   RxID:   [id number] ONGLYZA 5 MG TABS (SAXAGLIPTIN HCL) Take 1 tablet by mouth once a day  #30 x 1   Entered and Authorized by:   Amanda Gasser MD   Signed by:   Amanda Gasser MD on 05/04/2009   Method used:   Electronically to        [facility]* (retail)       [address]       [address] Shipping/mailing       [place], [place]  [address]       Ph: [phone]       Fax: [phone]   RxID:   [id number]

## 2010-03-09 NOTE — Consult Note (Signed)
[facility]   Imported By: Lanelle Bal 12/02/2009 10:02:26  _____________________________________________________________________  External Attachment:    Type:   Image     Comment:   External Document

## 2010-03-09 NOTE — Letter (Signed)
[facility]: [facility]   Imported By: Maryln Gottron 01/13/2010 15:55:32  _____________________________________________________________________  External Attachment:    Type:   Image     Comment:   External Document

## 2010-03-09 NOTE — Assessment & Plan Note (Signed)
Summary: FU DM, HTN   Vital Signs:  Patient profile:   55 year old female Weight:      280 pounds Temp:     98.1 degrees F oral BP sitting:   134 / 88  (left arm) Cuff size:   large  Vitals Entered By: Kern Reap CMA Duncan Dull) (April 01, 2009 8:20 AM)  Reason for Visit folllow up office visit CC: follow-up visit DM Is Patient Diabetic? Yes Did you bring your meter with you today? No   Primary Care Provider:  Linford Arnold, C  CC:  follow-up visit DM.  History of Present Illness: Has lost 7 pounds since November. Starting back to weight watcher next week.   Diabetes Management History:      The patient is a 55 years old female who comes in for evaluation of DM Type 2.  She is (or has been) enrolled in the "Diabetic Education Program".  She states understanding of dietary principles and is following her diet appropriately.  She is not checking home blood sugars.  She says that she is not exercising regularly.        Frequency of hypoglycemic symptoms are reported to be occasionally.  No hyperglycemic symptoms are reported.  Other comments include: using between 40-100 units of lantus. .        There are no symptoms to suggest diabetic complications.  Since her last visit, no infections have occurred.  No changes have been made to her treatment plan since last visit.    Allergies: 1)  ! Pcn 2)  ! Morphine 3)  ! Sulfa  Physical Exam  General:  Well-developed,well-nourished,in no acute distress; alert,appropriate and cooperative throughout examination Lungs:  Normal respiratory effort, chest expands symmetrically. Lungs are clear to auscultation, no crackles or wheezes. Heart:  Normal rate and regular rhythm. S1 and S2 normal without gallop, murmur, click, rub or other extra sounds. Skin:  no rashes.   Psych:  Cognition and judgment appear intact. Alert and cooperative with normal attention span and concentration. No apparent delusions, illusions, hallucinations   Impression &  Recommendations:  Problem # 1:  DIABETES-TYPE 2 (ICD-250.00)  Will D/C glipizie because of weight concerns and change to onglyza. Given sample  for 3 weeks to see if improve her post meal sugars, helps with weight loss, and may even lowe her need for Lantus.  Overall she is doing well.  Just has a hard time when she is unable to eat at work.l  Her updated medication list for this problem includes:    Metformin Hcl 1000 Mg Tabs (Metformin hcl) .Marland Kitchen... Take 1 tablet by mouth two times a day    Lantus 100 Unit/ml Soln (Insulin glargine) ..... Inject 80  units subcutaneously in the evenings    Aspirin 325 Mg Tbec (Aspirin) .Marland Kitchen... Take one tablet by mouth once a day    Benazepril Hcl 20 Mg Tabs (Benazepril hcl) .Marland Kitchen... Take 1 tablet by mouth once a day    Onglyza 5 Mg Tabs (Saxagliptin hcl) .Marland Kitchen... Take 1 tablet by mouth once a day  Orders: Fingerstick (36416) Hemoglobin A1C (78295)  Problem # 2:  HYPERTENSION (ICD-401.9)  Will separate out the amlipidine and benzapril since can get these for free if separate.   Her updated medication list for this problem includes:    Amlodipine Besylate 5 Mg Tabs (Amlodipine besylate) .Marland Kitchen... Take 1 tablet by mouth once a day    Hydrochlorothiazide 25 Mg Tabs (Hydrochlorothiazide) .Marland Kitchen... Tak eone tablet by mouth once  a day    Benazepril Hcl 20 Mg Tabs (Benazepril hcl) .Marland Kitchen... Take 1 tablet by mouth once a day  BP today: 134/88 Prior BP: 123/75 (12/07/2008)  Prior 10 Yr Risk Heart Disease: Not enough information (09/07/2006)  Labs Reviewed: K+: 4.6 (12/07/2008) Creat: : 1.04 (12/07/2008)   Chol: 179 (12/07/2008)   HDL: 43 (12/07/2008)   LDL: 78 (12/07/2008)   TG: 292 (12/07/2008)  Complete Medication List: 1)  Metformin Hcl 1000 Mg Tabs (Metformin hcl) .... Take 1 tablet by mouth two times a day 2)  Celebrex 200 Mg Caps (Celecoxib) .... Take one tablet by mouth once aday 3)  Amlodipine Besylate 5 Mg Tabs (Amlodipine besylate) .... Take 1 tablet by mouth once a  day 4)  Hydrochlorothiazide 25 Mg Tabs (Hydrochlorothiazide) .... Tak eone tablet by mouth once a day 5)  Lantus 100 Unit/ml Soln (Insulin glargine) .... Inject 80  units subcutaneously in the evenings 6)  Aspirin 325 Mg Tbec (Aspirin) .... Take one tablet by mouth once a day 7)  Insulin Syringes 100units Size, Test Strips, Lancets, Needle  .... Uses freestyle machine tests 3x a day 8)  Allopurinol 100 Mg Tabs (Allopurinol) .... Take 1 tablet by mouth once a day 9)  Colchicine 0.6 Mg Tabs (Colchicine) .... 2 tabs by mouth at onset of pain, then one tabs 1 hour later, then daily until gout attack resolves. 10)  Tricor 145 Mg Tabs (Fenofibrate) .... Take 1 tablet by mouth once a day at bedtime 11)  Centrum Silver Ultra Womens Tabs (Multiple vitamins-minerals) .... Take one tab by mouth once daily 12)  Benazepril Hcl 20 Mg Tabs (Benazepril hcl) .... Take 1 tablet by mouth once a day 13)  Onglyza 5 Mg Tabs (Saxagliptin hcl) .... Take 1 tablet by mouth once a day  Diabetes Management Assessment/Plan:      The following lipid goals have been established for the patient: Total cholesterol goal of 200; LDL cholesterol goal of 100; HDL cholesterol goal of 40; Triglyceride goal of 150.  Her blood pressure goal is < 130/80.    Patient Instructions: 1)  Please schedule a follow-up appointment in 3 months .  Prescriptions: METFORMIN HCL 1000 MG TABS (METFORMIN HCL) Take 1 tablet by mouth two times a day  #180 x 1   Entered and Authorized by:   Nani Gasser MD   Signed by:   Nani Gasser MD on 04/01/2009   Method used:   Electronically to        [facility]* (retail)       [address]       [address] Shipping/mailing       [place], [place]  [address]       Ph: [phone]       Fax: [phone]   RxID:   [id number] BENAZEPRIL HCL 20 MG TABS (BENAZEPRIL HCL) Take 1 tablet by mouth once a day  #90 x 3   Entered and Authorized by:   Nani Gasser MD    Signed by:   Nani Gasser MD on 04/01/2009   Method used:   Electronically to        [facility]* (retail)       [address]       [address] Shipping/mailing       [place], [place]  [address]       Ph: [phone]       Fax: [phone]   RxID:  [id number] AMLODIPINE BESYLATE 5 MG TABS (AMLODIPINE BESYLATE) Take 1 tablet by mouth once a day  #90 x 3   Entered and Authorized by:   Nani Gasser MD   Signed by:   Nani Gasser MD on 04/01/2009   Method used:   Electronically to        [facility]* (retail)       [address]       [address] Shipping/mailing       [place], [place]  [address]       Ph: [phone]       Fax: [phone]   RxID:   [id number] METFORMIN HCL 500 MG TABS (METFORMIN HCL) Take 1 tablet by mouth two times a day  #180 x 1   Entered and Authorized by:   Nani Gasser MD   Signed by:   Nani Gasser MD on 04/01/2009   Method used:   Electronically to        [facility]* (retail)       [address]       [address] Shipping/mailing       [place], [place]  [address]       Ph: [phone]       Fax: [phone]   RxID:   [id number]     Laboratory Results   Blood Tests     HGBA1C: 7.4%   (Normal Range: Non-Diabetic - 3-6%   Control Diabetic - 6-8%)

## 2010-03-09 NOTE — Assessment & Plan Note (Signed)
Summary: f/u T2DM   Vital Signs:  Patient profile:   55 year old female Height:      68 inches Weight:      273 pounds BMI:     41.66 O2 Sat:      97 % on Room air Pulse rate:   77 / minute BP sitting:   112 / 66  (left arm) Cuff size:   large  Vitals Entered By: Payton Spark CMA (Jun 21, 2009 8:40 AM)  O2 Flow:  Room air CC: F/U DM. Sugars have been 81-110 AM fasting.   Primary Care Provider:  Nani Gasser MD  CC:  F/U DM. Sugars have been 81-110 AM fasting.Marland Kitchen  History of Present Illness: 55 yo WF presents for f/u T2DM.  She lost 7 lbs in the last 2 mos with [employer].  She is not exercising.  She is on Lantus 100 units in the evening.  She also takes Metformin and Onglyza.  Her AM fastings are running 80s to 120s.  She is not checking postprandials.  She feels 'low' at lunch if she is late to eat.  Her labs, Umicro and eye exam are UTD.  Monofilament exam is due.    She did have a colonoscopy at 50.  She is having more constipation.  Used to have more diarrhea.  She is eating Activia Light and eating more salad.  Denies rectal bleeding, nightsweats or abd pain.      Current Medications (verified): 1)  Metformin Hcl 1000 Mg Tabs (Metformin Hcl) .... Take 1 Tablet By Mouth Two Times A Day 2)  Celebrex 200 Mg  Caps (Celecoxib) .... Take One Tablet By Mouth Once Aday 3)  Amlodipine Besylate 5 Mg Tabs (Amlodipine Besylate) .... Take 1 Tablet By Mouth Once A Day 4)  Hydrochlorothiazide 25 Mg  Tabs (Hydrochlorothiazide) .... Tak Eone Tablet By Mouth Once A Day 5)  Lantus 100 Unit/ml  Soln (Insulin Glargine) .... Inject 80  Units Subcutaneously in The Evenings 6)  Aspirin 325 Mg  Tbec (Aspirin) .... Take One Tablet By Mouth Once A Day 7)  Insulin Syringes 100units Size, Test Strips, Lancets, Needle .... Uses Freestyle Machine Tests 3x A Day 8)  Allopurinol 100 Mg Tabs (Allopurinol) .... Take 1 Tablet By Mouth Once A Day 9)  Colchicine 0.6 Mg Tabs (Colchicine) .... 2 Tabs By  Mouth At Onset of Pain, Then One Tabs 1 Hour Later, Then Daily Until Gout Attack Resolves. 10)  Tricor 145 Mg Tabs (Fenofibrate) .... Take 1 Tablet By Mouth Once A Day At Bedtime 11)  Centrum Silver Ultra Womens  Tabs (Multiple Vitamins-Minerals) .... Take One Tab By Mouth Once Daily 12)  Benazepril Hcl 20 Mg Tabs (Benazepril Hcl) .... Take 1 Tablet By Mouth Once A Day 13)  Onglyza 5 Mg Tabs (Saxagliptin Hcl) .... Take 1 Tablet By Mouth Once A Day 14)  Test Strips For Glucometer. .... Dx 250.00 Tests Two Times A Day  Allergies (verified): 1)  ! Pcn 2)  ! Morphine 3)  ! Sulfa  Past History:  Past Medical History: Recurrent UTI Fatty Liver by [place] on 05-2006 Factor V Leiden: single mutation [id number] mutation identified (heterozygote) 05-16-06 CPAP 8 cm water with heated humidifier.  Obese Knee/ hip pain  Past Surgical History: Reviewed history from 05/16/2007 and no changes required. reduction mammoplasty (1985) D & C (1987, 1988) c/sec (1989) Cholecystectomy (1610) Appendectomy (1977)  Social History: Reviewed history from 09/12/2007 and no changes required. 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  Married to Trezevant with 5 kids (1 set of twins).  Mother lives in [place], [place] Former Smoker Alcohol use-no Drug use-no Regular exercise-no  Review of Systems      See HPI  Physical Exam  General:  alert, well-developed, well-nourished, and well-hydrated.   Head:  normocephalic and atraumatic.   Eyes:  PERRLA Mouth:  good dentition and pharynx pink and moist.   Neck:  no masses.   Lungs:  Normal respiratory effort, chest expands symmetrically. Lungs are clear to auscultation, no crackles or wheezes. Heart:  Normal rate and regular rhythm. S1 and S2 normal without gallop, murmur, click, rub or other extra sounds. Extremities:  trace ankle edema bilat Skin:  no rashes.  vitilgo Psych:  good eye contact, not anxious appearing, and not depressed appearing.     Impression &  Recommendations:  Problem # 1:  DIABETES-TYPE 2 (ICD-250.00) Not due for A1C today.  Lab order printed to do later this month.  Continue current meds.  Needs to work on improving her diet, exercise and wt loss.  Home sugars at goal.  Monofilament normal today.  Umicro and eye exam are UTD. Her updated medication list for this problem includes:    Metformin Hcl 1000 Mg Tabs (Metformin hcl) .Marland Kitchen... Take 1 tablet by mouth two times a day    Lantus 100 Unit/ml Soln (Insulin glargine) ..... Inject 80  units subcutaneously in the evenings    Aspirin 325 Mg Tbec (Aspirin) .Marland Kitchen... Take one tablet by mouth once a day    Benazepril Hcl 20 Mg Tabs (Benazepril hcl) .Marland Kitchen... Take 1 tablet by mouth once a day    Onglyza 5 Mg Tabs (Saxagliptin hcl) .Marland Kitchen... Take 1 tablet by mouth once a day  Orders: T-Hemoglobin A1C (57846)  Problem # 2:  HYPERTENSION (ICD-401.9) BP at goal.  Continue current meds.  Labs are UTD. Her updated medication list for this problem includes:    Amlodipine Besylate 5 Mg Tabs (Amlodipine besylate) .Marland Kitchen... Take 1 tablet by mouth once a day    Hydrochlorothiazide 25 Mg Tabs (Hydrochlorothiazide) .Marland Kitchen... Tak eone tablet by mouth once a day    Benazepril Hcl 20 Mg Tabs (Benazepril hcl) .Marland Kitchen... Take 1 tablet by mouth once a day  BP today: 112/66 Prior BP: 134/88 (04/01/2009)  Prior 10 Yr Risk Heart Disease: Not enough information (09/07/2006)  Labs Reviewed: K+: 4.6 (12/07/2008) Creat: : 1.04 (12/07/2008)   Chol: 179 (12/07/2008)   HDL: 43 (12/07/2008)   LDL: 78 (12/07/2008)   TG: 292 (12/07/2008)  Problem # 3:  PAIN IN JOINT, MULTIPLE SITES (ICD-719.49) Having hip and knee pain, weight related iwth BMI 41 c/w class III obesity.  Continue Celebrex.  Ultimately needs wt loss.  Problem # 4:  CONSTIPATION, MILD (ICD-564.00) Was having diarrhea, now having constipation with ? previous dx of IBS.  She is under stress since planning to change jobs soon. [employer] updated < 3 yrs ago.  Suggested use  of Benefiber daily, high fiber diet, Align.  Complete Medication List: 1)  Metformin Hcl 1000 Mg Tabs (Metformin hcl) .... Take 1 tablet by mouth two times a day 2)  Celebrex 200 Mg Caps (Celecoxib) .... Take one tablet by mouth once aday 3)  Amlodipine Besylate 5 Mg Tabs (Amlodipine besylate) .... Take 1 tablet by mouth once a day 4)  Hydrochlorothiazide 25 Mg Tabs (Hydrochlorothiazide) .... Tak eone tablet by mouth once a day 5)  Lantus 100 Unit/ml Soln (Insulin glargine) .... Inject  80  units subcutaneously in the evenings 6)  Aspirin 325 Mg Tbec (Aspirin) .... Take one tablet by mouth once a day 7)  Insulin Syringes 100units Size, Test Strips, Lancets, Needle  .... Uses freestyle machine tests 3x a day 8)  Allopurinol 100 Mg Tabs (Allopurinol) .... Take 1 tablet by mouth once a day 9)  Colchicine 0.6 Mg Tabs (Colchicine) .... 2 tabs by mouth at onset of pain, then one tabs 1 hour later, then daily until gout attack resolves. 10)  Tricor 145 Mg Tabs (Fenofibrate) .... Take 1 tablet by mouth once a day at bedtime 11)  Centrum Silver Ultra Womens Tabs (Multiple vitamins-minerals) .... Take one tab by mouth once daily 12)  Benazepril Hcl 20 Mg Tabs (Benazepril hcl) .... Take 1 tablet by mouth once a day 13)  Onglyza 5 Mg Tabs (Saxagliptin hcl) .... Take 1 tablet by mouth once a day 14)  Test Strips For Glucometer.  .... Dx 250.00 tests two times a day  Patient Instructions: 1)  Stay on current meds. 2)  Keep up the good work with [employer]. 3)  Add regular exercise 30+ min most days/ wk. 4)  Update A1C after 5-24. 5)  Will call you w/ results. 6)  Take Activia daily + Benefiber daily to regulate bowels. 7)  REturn for f/u in 4 mos. Prescriptions: TEST STRIPS FOR GLUCOMETER. Dx 250.00 Tests two times a day  #60 x PRN   Entered and Authorized by:   Seymour Bars DO   Signed by:   Seymour Bars DO on 06/21/2009   Method used:   Printed then faxed to ...       [facility]* (retail)       [address]       [address] Shipping/mailing       [place], [place]  [address]       Ph: [phone]       Fax: [phone]   RxID:   [id number] ONGLYZA 5 MG TABS (SAXAGLIPTIN HCL) Take 1 tablet by mouth once a day  #90 x 1   Entered and Authorized by:   Seymour Bars DO   Signed by:   Seymour Bars DO on 06/21/2009   Method used:   Electronically to        [facility]* (retail)       [address]       [address] [place] [address] Shipping/mailing       [place], [place]  [address]       Ph: [phone]       Fax: [phone]   RxID:   [id number] BENAZEPRIL HCL 20 MG TABS (BENAZEPRIL HCL) Take 1 tablet by mouth once a day  #90 x 1   Entered and Authorized by:   Seymour Bars DO   Signed by:   Seymour Bars DO on 06/21/2009   Method used:   Electronically to        [facility]* (retail)       [address]       [address] Shipping/mailing       [place], [place]  [address]       Ph: [phone]       Fax: [phone]   RxID:   [id number] TRICOR 145 MG TABS (FENOFIBRATE) Take 1 tablet by mouth once a day at bedtime  #90 x 1   Entered and Authorized by:  Seymour Bars DO   Signed by:   Seymour Bars DO on 06/21/2009   Method used:   Electronically to        [facility]* (retail)       [address]       [address] Shipping/mailing       [place], [place]  [address]       Ph: [phone]       Fax: [phone]   RxID:   [id number] ALLOPURINOL 100 MG TABS (ALLOPURINOL) Take 1 tablet by mouth once a day  #90 Tablet x 1   Entered and Authorized by:   Seymour Bars DO   Signed by:   Seymour Bars DO on 06/21/2009   Method used:   Electronically to        [facility]* (retail)       [address]       [address] Shipping/mailing       [place], [place]  [address]       Ph: [phone]       Fax: [phone]   RxID:   [id number] INSULIN SYRINGES 100UNITS SIZE, TEST  STRIPS, LANCETS, NEEDLE Uses Freestyle machine Tests 3x a day  #90 day suppl x PRN   Entered and Authorized by:   Seymour Bars DO   Signed by:   Seymour Bars DO on 06/21/2009   Method used:   Printed then faxed to ...       [facility]* (retail)       [address]       [address] Shipping/mailing       [place], [place]  [address]       Ph: [phone]       Fax: [phone]   RxID:   [id number] LANTUS 100 UNIT/ML  SOLN (INSULIN GLARGINE) Inject 80  units Subcutaneously in the evenings  #6 vials x 1   Entered and Authorized by:   Seymour Bars DO   Signed by:   Seymour Bars DO on 06/21/2009   Method used:   Electronically to        [facility]* (retail)       [address]       [address] Shipping/mailing       [place], [place]  [address]       Ph: [phone]       Fax: [phone]   RxID:   [id number] HYDROCHLOROTHIAZIDE 25 MG  TABS (HYDROCHLOROTHIAZIDE) Tak eone tablet by mouth once a day  #90 x 1   Entered and Authorized by:   Seymour Bars DO   Signed by:   Seymour Bars DO on 06/21/2009   Method used:   Electronically to        [facility]* (retail)       [address]       [address] Shipping/mailing       [place], [place]  [address]       Ph: [phone]       Fax: [phone]   RxID:   [id number] AMLODIPINE BESYLATE 5 MG TABS (AMLODIPINE BESYLATE) Take 1 tablet by mouth once a day  #90 x 1   Entered and Authorized by:   Seymour Bars DO   Signed by:   Seymour Bars DO on 06/21/2009   Method used:   Electronically to  [facility]* (retail)       [address]       [address] Shipping/mailing       [place], [place]  [address]       Ph: [phone]       Fax: [phone]   RxID:   [id number] CELEBREX 200 MG  CAPS (CELECOXIB) Take one tablet by mouth once aday  #90 x 1   Entered and Authorized by:   Seymour Bars DO   Signed by:   Seymour Bars DO on 06/21/2009    Method used:   Electronically to        [facility]* (retail)       [address]       [address] Shipping/mailing       [place], [place]  [address]       Ph: [phone]       Fax: [phone]   RxID:   [id number] METFORMIN HCL 1000 MG TABS (METFORMIN HCL) Take 1 tablet by mouth two times a day  #180 x 1   Entered and Authorized by:   Seymour Bars DO   Signed by:   Seymour Bars DO on 06/21/2009   Method used:   Electronically to        [facility]* (retail)       [address]       [address] Shipping/mailing       [place], [place]  [address]       Ph: [phone]       Fax: [phone]   RxID:   [id number]   Appended Document: f/u T2DM      Allergies: 1)  ! Pcn 2)  ! Morphine 3)  ! Sulfa  Diabetes Management Exam:    Foot Exam (with socks and/or shoes not present):       Sensory-Pinprick/Light touch:          Left medial foot (L-4): normal          Left dorsal foot (L-5): normal          Left lateral foot (S-1): normal          Right medial foot (L-4): normal          Right dorsal foot (L-5): normal          Right lateral foot (S-1): normal       Sensory-Monofilament:          Left foot: normal          Right foot: normal       Inspection:          Left foot: normal          Right foot: normal       Nails:          Left foot: normal          Right foot: normal   Complete Medication List: 1)  Metformin Hcl 1000 Mg Tabs (Metformin hcl) .... Take 1 tablet by mouth two times a day 2)  Celebrex 200 Mg Caps (Celecoxib) .... Take one tablet by mouth once aday 3)  Amlodipine Besylate 5 Mg Tabs (Amlodipine besylate) .... Take 1 tablet by mouth once a day 4)  Hydrochlorothiazide 25 Mg Tabs (Hydrochlorothiazide) .... Tak eone tablet by mouth once a day 5)  Lantus 100 Unit/ml Soln (Insulin glargine) .... Inject 80  units subcutaneously in the evenings  6)  Aspirin 325 Mg Tbec (Aspirin) .... Take one tablet by mouth  once a day 7)  Insulin Syringes 100units Size, Test Strips, Lancets, Needle  .... Uses freestyle machine tests 3x a day 8)  Allopurinol 100 Mg Tabs (Allopurinol) .... Take 1 tablet by mouth once a day 9)  Colchicine 0.6 Mg Tabs (Colchicine) .... 2 tabs by mouth at onset of pain, then one tabs 1 hour later, then daily until gout attack resolves. 10)  Tricor 145 Mg Tabs (Fenofibrate) .... Take 1 tablet by mouth once a day at bedtime 11)  Centrum Silver Ultra Womens Tabs (Multiple vitamins-minerals) .... Take one tab by mouth once daily 12)  Benazepril Hcl 20 Mg Tabs (Benazepril hcl) .... Take 1 tablet by mouth once a day 13)  Onglyza 5 Mg Tabs (Saxagliptin hcl) .... Take 1 tablet by mouth once a day 14)  Test Strips For Glucometer.  .... Dx 250.00 tests two times a day

## 2010-03-09 NOTE — Progress Notes (Signed)
Summary: UTI   med. and needs CBG test Strips called in  Phone Note Call from Patient   Caller: Patient Summary of Call: Pt. is sure she has a UTI and would like for you to call her in something for that to [facility] and also she needs some CBG test strips called in. Any questions call patient at [phone] Initial call taken by: Michaelle Copas,  May 27, 2009 1:11 PM  Follow-up for Phone Call        Has to have appt. We don't call in ABX without being seen. Can put on the nurse schedule to check a urine. Will send rx for glucometer strips Follow-up by: Nani Gasser MD,  May 27, 2009 1:16 PM  Additional Follow-up for Phone Call Additional follow up Details #1::        Left message on machine to return call.  Mervin Kung CMA  May 27, 2009 1:33 PM   Advised pt. strips sent to pharmacy and she would need to be seen for UTI symptoms. Pt states she is at work today. I advised pt. she could go to our urgent care this weekend. Pt voices understanding.  Nicki Guadalajara Fergerson CMA  May 27, 2009 3:10 PM     New/Updated Medications: * TEST STRIPS FOR GLUCOMETER. Dx 250.00 Tests two times a day Prescriptions: TEST STRIPS FOR GLUCOMETER. Dx 250.00 Tests two times a day  #60 x PRN   Entered and Authorized by:   Nani Gasser MD   Signed by:   Nani Gasser MD on 05/27/2009   Method used:   Printed then faxed to ...       [facility]* (retail)       [address]       [address] Shipping/mailing       [place], [place]  [address]       Ph: [phone]       Fax: [phone]   RxID:   [id number]

## 2010-03-30 ENCOUNTER — Telehealth: Payer: Self-pay | Admitting: Family Medicine

## 2010-04-05 NOTE — Progress Notes (Signed)
Summary: [facility]-New Glucometer  Phone Note Call from Patient Call back at Home Phone [phone]   Caller: Patient Call For: Nani Gasser MD Summary of Call: Pt's glucometer died and she needs new machine.  Message left for pt to call me witht he name of her machine so we can call in new rx. Initial call taken by: Francee Piccolo CMA Duncan Dull),  March 30, 2010 9:19 AM  Follow-up for Phone Call        RC from pt.  She is using a [employer].  She will need meter and strips.  She is OK on lancets.  [facility] Follow-up by: Francee Piccolo CMA Duncan Dull),  March 30, 2010 10:17 AM  Additional Follow-up for Phone Call Additional follow up Details #1::        OK to put in and fax over.  Additional Follow-up by: Nani Gasser MD,  March 30, 2010 12:15 PM    Additional Follow-up for Phone Call Additional follow up Details #2::    RX telephoned to [facility] [place].  Pt notified. Follow-up by: Francee Piccolo CMA Duncan Dull),  March 31, 2010 4:42 PM  New/Updated Medications: * FREESTYLE LITE GLLUCOMETER DX 250.00 USE AS DIRECTED Prescriptions: FREESTYLE LITE GLLUCOMETER DX 250.00 USE AS DIRECTED  #1 x 0   Entered by:   Francee Piccolo CMA (AAMA)   Authorized by:   Nani Gasser MD   Signed by:   Francee Piccolo CMA (AAMA) on 03/31/2010   Method used:   Telephoned to ...       [facility]* (retail)       [address] [place], [place]  [address]       Ph: [phone]       Fax: [phone]   RxID:   [id number] TEST STRIPS FOR GLUCOMETER. Dx 250.00 Tests two times a day  #60 x prn   Entered by:   Francee Piccolo CMA (AAMA)   Authorized by:   Nani Gasser MD   Signed by:   Francee Piccolo CMA (AAMA) on 03/31/2010   Method used:   Telephoned to ...       [facility]* (retail)       [address] [place], [place]  [address]       Ph: [phone]       Fax: [phone]   RxID:   [id number]

## 2010-05-09 ENCOUNTER — Other Ambulatory Visit: Payer: Self-pay | Admitting: Family Medicine

## 2010-05-20 ENCOUNTER — Encounter: Payer: Self-pay | Admitting: Family Medicine

## 2010-05-23 ENCOUNTER — Ambulatory Visit (INDEPENDENT_AMBULATORY_CARE_PROVIDER_SITE_OTHER): Payer: Commercial Indemnity | Admitting: Family Medicine

## 2010-05-23 ENCOUNTER — Encounter: Payer: Self-pay | Admitting: Family Medicine

## 2010-05-23 DIAGNOSIS — E785 Hyperlipidemia, unspecified: Secondary | ICD-10-CM

## 2010-05-23 DIAGNOSIS — E669 Obesity, unspecified: Secondary | ICD-10-CM

## 2010-05-23 DIAGNOSIS — E119 Type 2 diabetes mellitus without complications: Secondary | ICD-10-CM

## 2010-05-23 DIAGNOSIS — E7800 Pure hypercholesterolemia, unspecified: Secondary | ICD-10-CM

## 2010-05-23 LAB — COMPLETE METABOLIC PANEL WITH GFR
BUN: 22 mg/dL (ref 6–23)
CO2: 24 mEq/L (ref 19–32)
Creat: 1.16 mg/dL (ref 0.40–1.20)
GFR, Est African American: 59 mL/min — ABNORMAL LOW (ref >60)
GFR, Est Non African American: 49 mL/min — ABNORMAL LOW (ref >60)
Glucose, Bld: 186 mg/dL — ABNORMAL HIGH (ref 70–99)
Total Bilirubin: 0.4 mg/dL (ref 0.3–1.2)
Total Protein: 7.3 g/dL (ref 6.0–8.3)

## 2010-05-23 LAB — LIPID PANEL
Cholesterol: 159 mg/dL (ref 0–200)
HDL: 39 mg/dL — ABNORMAL LOW (ref >39)
Total CHOL/HDL Ratio: 4.1 Ratio
Triglycerides: 389 mg/dL — ABNORMAL HIGH (ref <150)

## 2010-05-23 LAB — POCT GLYCOSYLATED HEMOGLOBIN (HGB A1C): Hemoglobin A1C: 7.4

## 2010-05-23 MED ORDER — AMLODIPINE BESYLATE 5 MG PO TABS: 5.0000 mg | ORAL_TABLET | Freq: Every day | ORAL | Status: DC

## 2010-05-23 MED ORDER — ALLOPURINOL 100 MG PO TABS: 100.0000 mg | ORAL_TABLET | Freq: Every day | ORAL | Status: DC

## 2010-05-23 MED ORDER — FREESTYLE LANCETS MISC: Status: AC

## 2010-05-23 MED ORDER — CELECOXIB 200 MG PO CAPS: 200.0000 mg | ORAL_CAPSULE | Freq: Every day | ORAL | Status: DC

## 2010-05-23 MED ORDER — BENAZEPRIL HCL 20 MG PO TABS: 20.0000 mg | ORAL_TABLET | Freq: Every day | ORAL | Status: DC

## 2010-05-23 MED ORDER — GLUCOSE BLOOD VI STRP: ORAL_STRIP | Status: DC

## 2010-05-23 MED ORDER — HYDROCHLOROTHIAZIDE 25 MG PO TABS: 25.0000 mg | ORAL_TABLET | Freq: Every day | ORAL | Status: DC

## 2010-05-23 MED ORDER — COLCHICINE 0.6 MG PO TABS: 0.6000 mg | ORAL_TABLET | ORAL | Status: DC

## 2010-05-23 MED ORDER — "INSULIN SYRINGE-NEEDLE U-100 28G X 1/2"" 1 ML MISC": Status: DC

## 2010-05-23 MED ORDER — INSULIN GLARGINE 100 UNIT/ML ~~LOC~~ SOLN: 100.0000 [IU] | Freq: Every day | SUBCUTANEOUS | Status: DC

## 2010-05-23 MED ORDER — SAXAGLIPTIN-METFORMIN ER 5-1000 MG PO TB24: 5.0000 mg | ORAL_TABLET | Freq: Every day | ORAL | Status: DC

## 2010-05-23 NOTE — Progress Notes (Signed)
Addended by: Nani Gasser on: 05/23/2010 12:25 PM   Modules accepted: Orders

## 2010-05-23 NOTE — Progress Notes (Signed)
  Subjective:    Patient ID: Amanda [name], female    DOB: [birth year], 55 y.o.   MRN: [id number]  Diabetes She presents for her follow-up diabetic visit. She has type 2 diabetes mellitus. There are no hypoglycemic associated symptoms. Pertinent negatives for diabetes include no blurred vision, no fatigue, no foot paresthesias, no polydipsia and no polyuria. There are no hypoglycemic complications. Symptoms are worsening. There are no diabetic complications. Her home blood glucose trend is fluctuating minimally. Her breakfast blood glucose range is generally 110-130 mg/dl. An ACE inhibitor/angiotensin II receptor blocker is being taken. Eye exam is current.    She quit taking the  TriCor as it was causing severe constipation.  Review of Systems  Constitutional: Negative for fatigue.  Eyes: Negative for blurred vision.  Genitourinary: Negative for polyuria.  Hematological: Negative for polydipsia.       Objective:   Physical Exam  Constitutional: She appears well-developed and well-nourished.       Obese  HENT:  Head: Normocephalic.  Neck:       No carotid bruits  Cardiovascular: Normal rate, regular rhythm and normal heart sounds.   Pulmonary/Chest: Effort normal and breath sounds normal.  Skin: Skin is warm and dry.  Psychiatric: She has a normal mood and affect.          Assessment & Plan:

## 2010-05-23 NOTE — Assessment & Plan Note (Signed)
She is actually considering weight loss surgery. I recommended that she could with the seminars helpless and took her last surgery to learn more about it. The seminars are free. She is aware of how to call and get registered for the seminar. We discussed some pros and cons. We also discussed the importance of being motivated to exercise and still control her diet even post surgery.

## 2010-05-23 NOTE — Assessment & Plan Note (Signed)
We are due to recheck her labs. She has not made any significant changes in her diet or her exercise level. She is off the TriCor to 2 constipation.

## 2010-05-23 NOTE — Assessment & Plan Note (Signed)
We had a discussion of the options for her diabetes. At this point I would recommend adding mealtime insulin for her largest meal of the day. She is at 7.4 with her A1c today. She says she really wants to work on diet and exercise for the next 3 months and see if she can get this down without having to add a second insulin injection. That note she is up to 100 units of Lantus. I had that she previously was on 80 units. Also she discussed considering weight loss surgery. Certainly this could be the very helpful for controlling her diabetes.

## 2010-05-24 ENCOUNTER — Telehealth: Payer: Self-pay | Admitting: Family Medicine

## 2010-05-24 MED ORDER — OMEGA-3-ACID ETHYL ESTERS 1 G PO CAPS: 2.0000 g | ORAL_CAPSULE | Freq: Two times a day (BID) | ORAL | Status: DC

## 2010-05-24 NOTE — Telephone Encounter (Signed)
LMOM

## 2010-05-24 NOTE — Telephone Encounter (Signed)
Call patient: Triglycerides significantly elevated. We definitely need to consider something since she didn't tolerate the TriCor. Your HDL has actually dropped down it actually looked better at the last check up. Kidney function is a little bit better than last time but it is still persistently elevated which shows that there is some decline in kidney function but otherwise is stable. Vitamin D is still borderline low at 30 today. I recommend taking vitamin D 2000 IUs once a day. We can recheck vitamin D in 4 months. Liver enzymes are actually little bit better at this time. I would like to see if we could get Lovaza covered by your insurance.

## 2010-05-31 ENCOUNTER — Telehealth: Payer: Self-pay | Admitting: *Deleted

## 2010-06-07 NOTE — Telephone Encounter (Signed)
Error

## 2010-06-21 NOTE — Assessment & Plan Note (Signed)
[facility]                         GASTROENTEROLOGY OFFICE NOTE   NAME:CROWLEYAscencion, Stegner                       MRN:          [id number]  DATE:09/11/2006                            DOB:          1955-12-20    REASON FOR CONSULTATION:  Abdominal cramping, loose stools, and  colonoscopy.   HISTORY:  This is a pleasant 55 year old white female with a history of  hypertension, type 2 diabetes mellitus, obesity, sleep apnea, factor V  Leiden deficiency, and remote cholecystectomy. She is referred through  the courtesy of Dr. Linford Arnold regarding chronic abdominal complaints,  diarrhea, and the need for colonoscopy. The patient reports a many year  history of intermittent problems with post prandial abdominal cramping,  urgency, and loose stools. These symptoms have been worse over the past  6 months. Her pattern typically was to avoid breakfast. She would eat  lunch and 30 minutes after lunch develop severe lower abdominal cramping  associated with urgency and explosive diarrhea, needing to use the  bathroom between 2 and 4 times before symptoms resolved. She seemed to  be ok after supper. Symptoms would occur generally for 3 to 7 days, then  resolve for 3 to 7 days. Her symptoms were predictably improved after  defecation. No nocturnal component. No nausea, weight loss, or bleeding.  She has made some dietary changes recently which she thinks has been  helpful. She has noticed some superficial blood on the tissue paper  only. She recently turned 50 and is interested in screening colonoscopy.  No prior history of GI problems, though she has had an upper endoscopy  remotely because of some reflux symptoms.   PAST MEDICAL HISTORY:  1. Hypertension.  2. Type 2 diabetes mellitus.  3. Hyperlipidemia.  4. Osteoarthritis.  5. Sleep apnea.  6. Factor V Leiden deficiency.   PAST SURGICAL HISTORY:  1. Cholecystectomy.  2. Breast reduction surgery.  3.  Appendectomy.   ALLERGIES:  1. PENICILLIN.  2. SULFA.  3. MORPHINE.   CURRENT MEDICATIONS:  1. Lantus insulin 32 units subcu at bedtime.  2. Aspirin 325 mg daily.  3. Amlodipine/benazepril 5/20 mg daily.  4. Hydrochlorothiazide 25 daily.  5. Glipizide/metformin 5/500 two b.i.d.  6. Celebrex 200 mg q.a.m.  7. Fish oil supplements 1000 mg b.i.d.   FAMILY HISTORY:  No family history of gastrointestinal malignancy,  mother with breast cancer and diabetes, as well as factor V deficiency.   SOCIAL HISTORY:  Patient is married with 5 children. 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 She and her family relocated from [place] to  [place] about 2 and a half years ago. She does not smoke or use  alcohol.   REVIEW OF SYSTEMS:  Per diagnostic evaluation form.   PHYSICAL EXAMINATION:  Well-appearing female in no acute distress. Blood  pressure is 90/66, heart rate is 86, weight is 274.6 pounds, she is 5  feet 8 inches in height.  HEENT: Sclera are anicteric. Conjunctivae are pink. Oral mucosa is  intact. There is no adenopathy.  LUNGS: Clear.  HEART: Regular.  ABDOMEN: Soft, nontender, nondistended with good bowel sounds. No  organomegaly, mass, or hernia. Previous surgical incision is well-  healed.  EXTREMITIES: Without edema.   IMPRESSION:  1. Chronic intermittent lower abdominal complaints, manifested by      postprandial cramping, urgency, and loose stools most consistent      with irritable bowel syndrome.  2. Screening colonoscopy. The patient is an appropriate candidate      without contraindication.   RECOMMENDATIONS:  Colonoscopy, with polypectomy if needed. The nature of  the procedure as well as the risks, benefits, and alternatives have been  reviewed. She understood and agreed to proceed. With regards to her  diabetes, we have asked her to take one half of her Lantus insulin the  evening prior  to the exam and hold her oral diabetic medications the day  of the exam.     Wilhemina Bonito. Marina Goodell, MD  Electronically Signed    JNP/MedQ  DD: 09/11/2006  DT: 09/11/2006  Job #: [id number]   cc:   Nani Gasser, M.D.

## 2010-07-05 ENCOUNTER — Telehealth: Payer: Self-pay | Admitting: *Deleted

## 2010-07-05 NOTE — Telephone Encounter (Addendum)
Pt called and states she is not sure how to take her diabetes medication. It seems to her that according to what was rx'ed to her at the last visit her medication has been reduced. She used to take metformin 1000 bid and now takes one pill for the metformin. Wanted to know if she is to continue with the onglyza.

## 2010-07-05 NOTE — Telephone Encounter (Signed)
Pt.notified

## 2010-07-05 NOTE — Telephone Encounter (Signed)
The combo med she is on is an extended release version so even though it says 1000 it is the same as taking 1000 bid.

## 2010-08-24 ENCOUNTER — Ambulatory Visit (INDEPENDENT_AMBULATORY_CARE_PROVIDER_SITE_OTHER): Payer: Commercial Indemnity | Admitting: Family Medicine

## 2010-08-24 ENCOUNTER — Encounter: Payer: Self-pay | Admitting: Family Medicine

## 2010-08-24 DIAGNOSIS — E119 Type 2 diabetes mellitus without complications: Secondary | ICD-10-CM

## 2010-08-24 DIAGNOSIS — E781 Pure hyperglyceridemia: Secondary | ICD-10-CM

## 2010-08-24 DIAGNOSIS — I10 Essential (primary) hypertension: Secondary | ICD-10-CM

## 2010-08-24 MED ORDER — OLMESARTAN-AMLODIPINE-HCTZ 40-5-25 MG PO TABS: 1.0000 | ORAL_TABLET | Freq: Every day | ORAL | Status: DC

## 2010-08-24 NOTE — Progress Notes (Signed)
  Subjective:    Patient ID: Amanda [name], female    DOB: 03-Apr-1955, 55 y.o.   MRN: [id number]  Diabetes She presents for her follow-up diabetic visit. She has type 2 diabetes mellitus. Her disease course has been stable. There are no hypoglycemic associated symptoms. Pertinent negatives for diabetes include no blurred vision, no chest pain, no foot ulcerations, no polydipsia and no polyuria. There are no hypoglycemic complications. Symptoms are stable. There are no diabetic complications. Risk factors for coronary artery disease include hypertension. Her home blood glucose trend is increasing steadily.  Hypertension This is a chronic problem. The current episode started more than 1 year ago. The problem is unchanged. The problem is controlled. Pertinent negatives include no blurred vision or chest pain. Agents associated with hypertension include NSAIDs. Risk factors for coronary artery disease include no known risk factors. Past treatments include diuretics, ACE inhibitors and calcium channel blockers.  Has had a couple of UTI since her last OV.      Review of Systems  Eyes: Negative for blurred vision.  Cardiovascular: Negative for chest pain.  Genitourinary: Negative for polyuria.  Hematological: Negative for polydipsia.       Objective:   Physical Exam  Constitutional: She is oriented to person, place, and time. She appears well-developed and well-nourished.       She is obese.  HENT:  Head: Normocephalic and atraumatic.  Cardiovascular: Normal rate, regular rhythm and normal heart sounds.   Pulmonary/Chest: Effort normal and breath sounds normal.  Neurological: She is alert and oriented to person, place, and time.  Skin: Skin is warm and dry.  Psychiatric: She has a normal mood and affect. Her behavior is normal.          Assessment & Plan:

## 2010-08-24 NOTE — Assessment & Plan Note (Addendum)
Elevated today. Increase medication. Followup in 6 weeks. Continue with exercise and diet and hopefully weight loss. We will change to Tribenzor to simplify her medication regimen.

## 2010-08-24 NOTE — Assessment & Plan Note (Addendum)
Her A1c is still not at goal at 7.5. She recently started an exercise program and has been working out every day for the last week. At this point in time I will plan on adjusting her medication and we will give her one month to continue to work out and make some changes. The nuclear portion was will come down. Followup in one month. She says she is due for eye exam this summer and so she will call to make an appointment.

## 2010-08-25 ENCOUNTER — Other Ambulatory Visit: Payer: Self-pay | Admitting: Family Medicine

## 2010-09-27 ENCOUNTER — Encounter: Payer: Self-pay | Admitting: Family Medicine

## 2010-10-07 ENCOUNTER — Encounter: Payer: Self-pay | Admitting: Family Medicine

## 2010-10-11 ENCOUNTER — Other Ambulatory Visit: Payer: Self-pay | Admitting: Family Medicine

## 2010-10-12 ENCOUNTER — Telehealth: Payer: Self-pay | Admitting: Family Medicine

## 2010-10-12 DIAGNOSIS — I10 Essential (primary) hypertension: Secondary | ICD-10-CM

## 2010-10-12 LAB — COMPLETE METABOLIC PANEL WITH GFR
ALT: 38 U/L — ABNORMAL HIGH (ref 0–35)
Alkaline Phosphatase: 52 U/L (ref 39–117)
CO2: 26 mEq/L (ref 19–32)
Creat: 1.42 mg/dL — ABNORMAL HIGH (ref 0.50–1.10)
GFR, Est African American: 47 mL/min — ABNORMAL LOW (ref >60)
GFR, Est Non African American: 39 mL/min — ABNORMAL LOW (ref >60)
Total Bilirubin: 0.5 mg/dL (ref 0.3–1.2)

## 2010-10-12 MED ORDER — OLMESARTAN-AMLODIPINE-HCTZ 40-10-12.5 MG PO TABS: 1.0000 | ORAL_TABLET | Freq: Every day | ORAL | Status: DC

## 2010-10-12 MED ORDER — METOPROLOL SUCCINATE ER 25 MG PO TB24: 25.0000 mg | ORAL_TABLET | Freq: Every day | ORAL | Status: DC

## 2010-10-12 NOTE — Telephone Encounter (Signed)
Called patient: Triglycerides are still elevated but are much better than last time. Her kidneys appeared to dry. That means we need to lower her diuretic dose in her Tribenzor. I will send a new prescription for Tribenzor and I will add Toprol-XL once a day in place of the higher dose fluid pill to help better control her blood pressure. Keep scheduled followup appointment in 2-3 months or sooner.

## 2010-10-12 NOTE — Telephone Encounter (Signed)
Pt notified pt has appt tomorrow

## 2010-10-13 ENCOUNTER — Ambulatory Visit (INDEPENDENT_AMBULATORY_CARE_PROVIDER_SITE_OTHER): Payer: Commercial Indemnity | Admitting: Family Medicine

## 2010-10-13 ENCOUNTER — Encounter: Payer: Self-pay | Admitting: Family Medicine

## 2010-10-13 VITALS — BP 107/72 | HR 73 | Wt 266.0 lb

## 2010-10-13 DIAGNOSIS — E119 Type 2 diabetes mellitus without complications: Secondary | ICD-10-CM

## 2010-10-13 DIAGNOSIS — M79673 Pain in unspecified foot: Secondary | ICD-10-CM

## 2010-10-13 DIAGNOSIS — R5383 Other fatigue: Secondary | ICD-10-CM

## 2010-10-13 DIAGNOSIS — N390 Urinary tract infection, site not specified: Secondary | ICD-10-CM

## 2010-10-13 DIAGNOSIS — I10 Essential (primary) hypertension: Secondary | ICD-10-CM

## 2010-10-13 DIAGNOSIS — R5381 Other malaise: Secondary | ICD-10-CM

## 2010-10-13 DIAGNOSIS — M79609 Pain in unspecified limb: Secondary | ICD-10-CM

## 2010-10-13 LAB — POCT URINALYSIS DIPSTICK
Ketones, UA: NEGATIVE
Leukocytes, UA: NEGATIVE
Protein, UA: NEGATIVE
Urobilinogen, UA: 0.2

## 2010-10-13 MED ORDER — OLMESARTAN-AMLODIPINE-HCTZ 40-10-12.5 MG PO TABS: 1.0000 | ORAL_TABLET | Freq: Every day | ORAL | Status: DC

## 2010-10-13 NOTE — Progress Notes (Signed)
  Subjective:    Patient ID: Amanda [name], female    DOB: 1955-11-09, 55 y.o.   MRN: [id number]  HPI  Her bp is a little low and feeling some washed out. She denies any other SE.  We checked her labs earlier this week. Call in new med for lower diuretic for her trienzor to her pharm.   Had recent UTI - Given 5 days of cipro.  Finished it yesterday AM.  Still having some urgency and dysuria. Sees urologist. Taking OTC pyridium. No fever with it.    DM- Sugars have been 83-103 at home. She is doing much better.  Highest post meal is 121.   Review of Systems     Objective:   Physical Exam  Constitutional: She is oriented to person, place, and time. She appears well-developed and well-nourished.  HENT:  Head: Normocephalic and atraumatic.  Cardiovascular: Normal rate, regular rhythm and normal heart sounds.   Pulmonary/Chest: Effort normal and breath sounds normal.  Neurological: She is alert and oriented to person, place, and time.  Skin: Skin is warm and dry.  Psychiatric: She has a normal mood and affect. Her behavior is normal.          Assessment & Plan:  Dysuria. UA is neg. Will send for culture. Jut completed cipro yesterday.   DM- Her home BPs have been well controlled. Keep up the good work. Has done a fantastic job on weight loss. Has lost 8 lbs in the last 8 weeks.     HTN- Start new tribenzor with lower dose diuretic. Dont' fill the metoprolol that was sent over to the pharmacy.    Also c/o of weird sensation in the left foot last 2 digits. Denies numbness. Started recently. Will will check a Vit B12 level since has been low in the past and she is on metformin which does put her at risk .  Will check lab today.

## 2010-10-13 NOTE — Patient Instructions (Signed)
Don't buy the metoprolol.

## 2010-10-14 ENCOUNTER — Telehealth: Payer: Self-pay | Admitting: Family Medicine

## 2010-10-14 LAB — VITAMIN B12: Vitamin B-12: 593 pg/mL (ref 211–911)

## 2010-10-14 NOTE — Telephone Encounter (Signed)
Call pt: B12 looks great!

## 2010-10-14 NOTE — Telephone Encounter (Signed)
Pt aware.

## 2010-10-16 LAB — URINE CULTURE
Colony Count: NO GROWTH
Organism ID, Bacteria: NO GROWTH

## 2010-10-17 ENCOUNTER — Telehealth: Payer: Self-pay | Admitting: Family Medicine

## 2010-10-17 NOTE — Telephone Encounter (Signed)
Pt.notified

## 2010-10-17 NOTE — Telephone Encounter (Signed)
Call patient: Urine culture was negative. This means the original infection should have cleared.

## 2010-10-24 ENCOUNTER — Telehealth: Payer: Self-pay | Admitting: Family Medicine

## 2010-10-24 NOTE — Telephone Encounter (Signed)
Call pt: Please call patient. Normal mammogram.  Repeat in 1 year.

## 2010-10-24 NOTE — Telephone Encounter (Signed)
Pt notififed of results. KJ LPN

## 2010-12-13 ENCOUNTER — Ambulatory Visit: Payer: Commercial Indemnity | Admitting: Family Medicine

## 2010-12-27 ENCOUNTER — Encounter: Payer: Self-pay | Admitting: Family Medicine

## 2010-12-27 ENCOUNTER — Ambulatory Visit (INDEPENDENT_AMBULATORY_CARE_PROVIDER_SITE_OTHER): Payer: Commercial Indemnity | Admitting: Family Medicine

## 2010-12-27 ENCOUNTER — Telehealth: Payer: Self-pay | Admitting: Family Medicine

## 2010-12-27 DIAGNOSIS — R209 Unspecified disturbances of skin sensation: Secondary | ICD-10-CM

## 2010-12-27 DIAGNOSIS — E119 Type 2 diabetes mellitus without complications: Secondary | ICD-10-CM

## 2010-12-27 DIAGNOSIS — R200 Anesthesia of skin: Secondary | ICD-10-CM

## 2010-12-27 DIAGNOSIS — D649 Anemia, unspecified: Secondary | ICD-10-CM

## 2010-12-27 NOTE — Progress Notes (Signed)
  Subjective:    Patient ID: Amanda [name], female    DOB: 12-20-1955, 55 y.o.   MRN: [id number]  HPI DM- Went to see Dr. Cathey Endow for bariatrics.  She has decreased her Lantus to 80-90units. Has gained some weight though. Has had a lot of pain in both hips iwht her bursitis. She recently got bilateral injections. She has been trying to make some dietary changes. Doing well on the kombiglyze. She recently made an appointment to see Dr. Cathey Endow he is now doing bariatric medicine to help her lose weight. She denies any hypoglycemic events. Her blood pressures well controlled she does take an ARB. She also takes her Kombiglyze regularly. She notes she has started getting a little bit of decreased sensation in her left foot. Is not completely known but she notices slight different compared to her right.  Review of Systems     Objective:   Physical Exam  Constitutional: She is oriented to person, place, and time. She appears well-developed and well-nourished.  HENT:  Head: Normocephalic and atraumatic.  Cardiovascular: Normal rate, regular rhythm and normal heart sounds.   Pulmonary/Chest: Effort normal and breath sounds normal.  Musculoskeletal: She exhibits no edema.  Neurological: She is alert and oriented to person, place, and time.  Skin: Skin is warm and dry.  Psychiatric: She has a normal mood and affect. Her behavior is normal.          Assessment & Plan:  DM- Looks great today.  Due for foot exam today-it is normal..  She has already dec her lanuts to 80-90 units on her own. She is doing well. She has started seeing a bariatrician and making changes. Plans on starting to exercise again. We discussed considering Byetta or Victoza. Urine microbial and is negative today. Since she is noticing decreased sensation in one of her feet this is extremely poor and to control her A1c well. Also consider testing for vitamin B12 deficiency which can sometimes happen with metformin. Lab Results    Component Value Date   HGBA1C 6.3 12/27/2010

## 2010-12-27 NOTE — Telephone Encounter (Signed)
Please call her and let her know that I think it might be worthwhile checking her vitamin B12 level she complained of some decreased sensation of one of her feet. Sometimes being on metformin for a long period can cause B12 deficiency. I can send a lab slip downstairs and she can do any time she would like.

## 2010-12-27 NOTE — Patient Instructions (Signed)
We can consider starting Victoza or Byetta.

## 2010-12-28 NOTE — Telephone Encounter (Signed)
LMOM of instructions. Instructed pt to call back if wants to get done. KJ LPN

## 2010-12-28 NOTE — Telephone Encounter (Signed)
Pt called back and will get B12 drawn. Please fax order down to lab

## 2011-01-01 ENCOUNTER — Other Ambulatory Visit: Payer: Self-pay | Admitting: Family Medicine

## 2011-01-02 ENCOUNTER — Other Ambulatory Visit: Payer: Self-pay | Admitting: Family Medicine

## 2011-04-03 ENCOUNTER — Other Ambulatory Visit: Payer: Self-pay | Admitting: Family Medicine

## 2011-07-02 ENCOUNTER — Other Ambulatory Visit: Payer: Self-pay | Admitting: Family Medicine

## 2011-08-24 ENCOUNTER — Other Ambulatory Visit: Payer: Self-pay | Admitting: Family Medicine

## 2011-09-01 ENCOUNTER — Other Ambulatory Visit: Payer: Self-pay | Admitting: Family Medicine

## 2011-09-06 ENCOUNTER — Other Ambulatory Visit: Payer: Self-pay | Admitting: Family Medicine

## 2011-10-15 ENCOUNTER — Other Ambulatory Visit: Payer: Self-pay | Admitting: Family Medicine

## 2012-02-07 HISTORY — PX: ROUX-EN-Y GASTRIC BYPASS: SHX1104

## 2012-08-15 ENCOUNTER — Other Ambulatory Visit: Payer: Self-pay

## 2016-11-13 ENCOUNTER — Encounter: Payer: Self-pay | Admitting: Internal Medicine

## 2017-03-26 ENCOUNTER — Encounter: Payer: Self-pay | Admitting: Internal Medicine

## 2017-03-26 DIAGNOSIS — E041 Nontoxic single thyroid nodule: Secondary | ICD-10-CM | POA: Diagnosis not present

## 2017-03-26 DIAGNOSIS — E119 Type 2 diabetes mellitus without complications: Secondary | ICD-10-CM | POA: Diagnosis not present

## 2017-03-26 DIAGNOSIS — M810 Age-related osteoporosis without current pathological fracture: Secondary | ICD-10-CM | POA: Diagnosis not present

## 2017-03-26 DIAGNOSIS — I10 Essential (primary) hypertension: Secondary | ICD-10-CM | POA: Diagnosis not present

## 2017-03-26 DIAGNOSIS — E210 Primary hyperparathyroidism: Secondary | ICD-10-CM | POA: Diagnosis not present

## 2017-03-26 DIAGNOSIS — M109 Gout, unspecified: Secondary | ICD-10-CM | POA: Diagnosis not present

## 2017-03-26 DIAGNOSIS — Z1211 Encounter for screening for malignant neoplasm of colon: Secondary | ICD-10-CM | POA: Diagnosis not present

## 2017-04-09 DIAGNOSIS — N289 Disorder of kidney and ureter, unspecified: Secondary | ICD-10-CM | POA: Diagnosis not present

## 2017-04-09 DIAGNOSIS — E870 Hyperosmolality and hypernatremia: Secondary | ICD-10-CM | POA: Diagnosis not present

## 2017-04-23 DIAGNOSIS — H5213 Myopia, bilateral: Secondary | ICD-10-CM | POA: Diagnosis not present

## 2017-04-23 DIAGNOSIS — H524 Presbyopia: Secondary | ICD-10-CM | POA: Diagnosis not present

## 2017-04-24 MED FILL — SPIRONOLACTONE 25 MG TABLET: 25 | 90 days supply | Qty: 90 | Fill #0

## 2017-04-24 MED FILL — LOSARTAN POTASSIUM 50 MG TA: 50 | 90 days supply | Qty: 180 | Fill #0

## 2017-05-04 ENCOUNTER — Encounter: Payer: Self-pay | Admitting: Internal Medicine

## 2017-05-04 ENCOUNTER — Ambulatory Visit (AMBULATORY_SURGERY_CENTER): Payer: Self-pay

## 2017-05-04 VITALS — Ht 68.0 in | Wt 203.4 lb

## 2017-05-04 DIAGNOSIS — Z1211 Encounter for screening for malignant neoplasm of colon: Secondary | ICD-10-CM

## 2017-05-04 MED ORDER — NA SULFATE-K SULFATE-MG SULF 17.5-3.13-1.6 GM/177ML PO SOLN: 1.0000 | Freq: Once | ORAL | 0 refills | Status: AC

## 2017-05-04 MED FILL — SUPREP BOWEL PREP KIT: 17.5-3.13-1 | 1 days supply | Qty: 354 | Fill #0

## 2017-05-04 NOTE — Progress Notes (Signed)
Per pt, no allergies to soy or egg products.Pt not taking any weight loss meds or using  O2 at home.  Pt refused emmi video. 

## 2017-05-07 DIAGNOSIS — J310 Chronic rhinitis: Secondary | ICD-10-CM | POA: Diagnosis not present

## 2017-05-07 MED FILL — FLUTICASONE PROP 50 MCG SPR: 50 | 30 days supply | Qty: 16 | Fill #0

## 2017-05-11 ENCOUNTER — Telehealth: Payer: Self-pay | Admitting: Internal Medicine

## 2017-05-11 NOTE — Telephone Encounter (Signed)
Noted  

## 2017-05-14 ENCOUNTER — Encounter: Payer: Commercial Indemnity | Admitting: Internal Medicine

## 2017-05-16 ENCOUNTER — Encounter: Payer: Commercial Indemnity | Admitting: Internal Medicine

## 2017-05-28 MED FILL — ALLOPURINOL 100 MG TABS: 100 | 90 days supply | Qty: 180 | Fill #0

## 2017-05-28 MED FILL — FERREX 150 CAPSULE: 150 | 90 days supply | Qty: 90 | Fill #0

## 2017-07-05 DIAGNOSIS — M545 Low back pain: Secondary | ICD-10-CM | POA: Diagnosis not present

## 2017-07-05 DIAGNOSIS — M25551 Pain in right hip: Secondary | ICD-10-CM | POA: Diagnosis not present

## 2017-07-16 ENCOUNTER — Ambulatory Visit: Payer: 59 | Admitting: Rehabilitative and Restorative Service Providers"

## 2017-07-20 ENCOUNTER — Encounter: Payer: Self-pay | Admitting: Rehabilitative and Restorative Service Providers"

## 2017-07-20 ENCOUNTER — Ambulatory Visit: Payer: 59 | Admitting: Rehabilitative and Restorative Service Providers"

## 2017-07-20 DIAGNOSIS — M6281 Muscle weakness (generalized): Secondary | ICD-10-CM | POA: Diagnosis not present

## 2017-07-20 DIAGNOSIS — R293 Abnormal posture: Secondary | ICD-10-CM | POA: Diagnosis not present

## 2017-07-20 DIAGNOSIS — J300 Vasomotor rhinitis: Secondary | ICD-10-CM | POA: Diagnosis not present

## 2017-07-20 DIAGNOSIS — M545 Low back pain: Secondary | ICD-10-CM | POA: Diagnosis not present

## 2017-07-20 DIAGNOSIS — M25551 Pain in right hip: Secondary | ICD-10-CM

## 2017-07-20 DIAGNOSIS — G8929 Other chronic pain: Secondary | ICD-10-CM | POA: Diagnosis not present

## 2017-07-20 DIAGNOSIS — E210 Primary hyperparathyroidism: Secondary | ICD-10-CM | POA: Diagnosis not present

## 2017-07-20 DIAGNOSIS — R29898 Other symptoms and signs involving the musculoskeletal system: Secondary | ICD-10-CM

## 2017-07-20 MED FILL — IPRATROPIUM 0.06% SPRAY: 0.06 | 13 days supply | Qty: 15 | Fill #0

## 2017-07-20 MED FILL — SHIPPING COST: 1 days supply | Qty: 1 | Fill #0

## 2017-07-20 NOTE — Therapy (Signed)
[facility] [place], [place], [address] Phone: [phone]   Fax:  [phone]  Physical Therapy Evaluation  Patient Details  Name: Amanda [name] MRN: [id number] Date of Birth: 04/02/1955 Referring Provider: Danae Orleans PA-C   Encounter Date: 07/20/2017  PT End of Session - 07/20/17 1340    Visit Number  1    Number of Visits  12    Date for PT Re-Evaluation  08/31/17    PT Start Time  1340    PT Stop Time  1442    PT Time Calculation (min)  62 min    Activity Tolerance  Patient tolerated treatment well       Past Medical History:  Diagnosis Date  . Allergy    seasonal  . Arthritis    all over  . CPAP (continuous positive airway pressure) dependence    8 cm water w/ heated humidifier  . Fatty liver 4-08   by [place]  . Gout   . Hip pain   . Hypertension   . Knee pain   . Obesity   . Recurrent UTI     Past Surgical History:  Procedure Laterality Date  . APPENDECTOMY  1977  . [place]   one  . CHOLECYSTECTOMY  1977  . [place]  . reduction mammoplasty  1985  . ROUX-EN-Y GASTRIC BYPASS  2014    There were no vitals filed for this visit.   Subjective Assessment - 07/20/17 1350    Subjective  Patient reports LBP for ~ 10 years with episodic flare ups on a frequent basis - weekly lasting 2-3 days in the past year.     Pertinent History  arthritis; knee pain; hip pain; spine; gastric bypass 5 yrs ago; gall bladder 1976; c-sectioin 1989; brest reduction 1989; D&C in the 1980's; fx Lt patella 6/18 adn fx Rt wrist 4/1`7    How long can you sit comfortably?  no limit    How long can you stand comfortably?  1-2 min     How long can you walk comfortably?  20 min     Diagnostic tests  xrays     Patient Stated Goals  avoid surgery; strengthen core    Currently in Pain?  Yes    Pain Score  4     Pain Location  Back    Pain Orientation   Right;Lower    Pain Descriptors / Indicators  Dull;Aching    Pain Type  Chronic pain    Pain Radiating Towards  into the Rt hip - starts in the hip and moves to the back     Pain Onset  More than a month ago    Pain Frequency  Intermittent    Aggravating Factors   prolonged standing; lifting; pulling patients up in bed     Pain Relieving Factors  heat; OTC meds     Multiple Pain Sites  Yes    Pain Score  8 when painful     Pain Location  Hip    Pain Orientation  Right    Pain Descriptors / Indicators  Sharp    Pain Type  Chronic pain    Pain Radiating Towards  into the back     Pain Onset  More than a month ago    Pain Frequency  Intermittent    Aggravating Factors   lifting    Pain Relieving Factors  rest; time; OTC meds          [facility] PT Assessment - 07/20/17 0001      Assessment   Medical Diagnosis  LBP; Rt hip pain     Referring Provider  Danae Orleans PA-C    Onset Date/Surgical Date  07/10/17    Hand Dominance  Right    Next MD Visit  as needed     Prior Therapy  chiropractic care ~ 10 yrs ago       Precautions   Precautions  None      Balance Screen   Has the patient fallen in the past 6 months  No    Has the patient had a decrease in activity level because of a fear of falling?   No    Is the patient reluctant to leave their home because of a fear of falling?   No      Home Environment   Additional Comments  single level home       Prior Function   Level of Independence  Independent    Vocation  Full time employment    Loss adjuster, chartered weekends - direct pt care - lifting; bending; pulling; pushing; etc      Leisure  cares for 20 mo old granddaughter; household chores; cooking otherwise sedentary       Observation/Other Assessments   Focus on Therapeutic Outcomes (FOTO)   44% limitation       Sensation   Additional Comments  WFL's per pt report       Posture/Postural Control   Posture Comments  head forward; shoudlers rounded; flexed forward  at hips; Rt hemipelvis elevatd; Rt knee flexed       AROM   Right/Left Hip  -- tight hip extension bilat     Right Knee Extension  -10    Right Knee Flexion  123    Left Knee Extension  0    Left Knee Flexion  123    Lumbar Flexion  60%    Lumbar Extension  45%    Lumbar - Right Side Bend  65%    Lumbar - Left Side Bend  60%    Lumbar - Right Rotation  45%    Lumbar - Left Rotation  45%      Strength   Right Hip Extension  4+/5    Right Hip ABduction  4+/5    Left Hip Extension  4+/5    Right/Left Knee  -- 5/5 bilat knee flex/ext       Flexibility   Hamstrings  tight bilat ~ 60-65 deg     Quadriceps  tight bilat at 90 deg     ITB  tight Rt > Lt     Piriformis  tight Rt > Lt       Palpation   Spinal mobility  hypomobiliity lumbar spine with CPA mobs     SI assessment   elevated Rt hemipelvis in standing     Palpation comment  muscular tightness Rt QL; piriformis; hip abductors                 Objective measurements completed on examination: See above findings.      [facility] Adult PT Treatment/Exercise - 07/20/17 0001      Self-Care   Self-Care  -- added 3/8 in heel lift Lt shoe       Lumbar Exercises: Stretches   Passive Hamstring Stretch  Right;Left;2 reps;30 seconds supine with strap  Hip Flexor Stretch  Right;Left;2 reps;30 seconds supine thomas x 2  - sitting x 2 each    Quad Stretch  Right;Left;2 reps;30 seconds prone with strap     ITB Stretch  Right;Left;2 reps;30 seconds supine with strap       Moist Heat Therapy   Number Minutes Moist Heat  20 Minutes    Moist Heat Location  Lumbar Spine;Hip Rt LB and Rt hip       Electrical Stimulation   Electrical Stimulation Location  Rt QL; Rt piriformis    Electrical Stimulation Action  IFC    Electrical Stimulation Parameters  to tolerance    Electrical Stimulation Goals  Pain;Tone             PT Education - 07/20/17 1438    Education Details  HEP TENS     Person(s) Educated  Patient     Methods  Explanation;Demonstration;Tactile cues;Verbal cues;Handout    Comprehension  Verbalized understanding;Returned demonstration;Verbal cues required;Tactile cues required          PT Long Term Goals - 07/20/17 1626      PT LONG TERM GOAL #1   Title  Improve Rt knee extension to (-) 3 to 5 degrees 08/31/17    Time  6    Period  Weeks    Status  New      PT LONG TERM GOAL #2   Title  Increase strength bilat hips to 5/5 in hip extension and abduction 08/31/17    Time  6    Period  Weeks    Status  New      PT LONG TERM GOAL #3   Title  Increase core stability allowing patient to preform functional activities and appropriate exercise program with minimal pain (no more than 2/10 to 3/10) 08/31/17    Time  6    Period  Weeks    Status  New      PT LONG TERM GOAL #4   Title  Independent in HEP 08/31/17    Time  6    Period  Weeks    Status  New      PT LONG TERM GOAL #5   Title  Improve FOTO to </= 36% limitation 08/31/17    Time  6    Period  Weeks    Status  New             Plan - 07/20/17 1618    Clinical Impression Statement  Denice Paradise presents with chronic Rt LB and hip pain over the past 10 years with no known injury. Pain is episodic in nature usually lasting 2-3 days and has occurred more frequently in the past year - since she has been caring for her 40 month old granddaughter 3 days a week. Patient has poor posture in standing with asymmetry noted through the pelvis - higher on right woth Rt knee in flexion in standing. She has (-) 10 degrees Rt knee extension and stands with Lt > Rt knees in valgus position. Patient has limited ROM in lumbar spine and LE's; LE weakness; muscular tightness to palpatioin and pain with functional activities. Patient will bennefit from PT to address problems identified.    Clinical Presentation  Evolving    Clinical Decision Making  Low    Rehab Potential  Good    Clinical Impairments Affecting Rehab Potential  chronic nature of pain;  sedentary lifestyle; pelvic asymmtry; Rt knee flexion contraction; LE weakness; pain with functional activities  PT Frequency  2x / week    PT Duration  6 weeks    PT Treatment/Interventions  Patient/family education;ADLs/Self Care Home Management;Cryotherapy;Electrical Stimulation;Iontophoresis 4mg /ml Dexamethasone;Moist Heat;Ultrasound;Dry needling;Manual techniques;Neuromuscular re-education;Therapeutic activities;Therapeutic exercise    PT Next Visit Plan  review HEP; deep tissue work vs DN Rt posterior hip; myofacial ball release posterior hip; begin core stabilization; stretch Rt knee into extension; assess response to heel lift Lt shoe; modalities as indicated     Consulted and Agree with Plan of Care  Patient       Patient will benefit from skilled therapeutic intervention in order to improve the following deficits and impairments:  Postural dysfunction, Improper body mechanics, Pain, Increased fascial restricitons, Increased muscle spasms, Hypomobility, Decreased mobility, Decreased range of motion, Decreased strength, Decreased activity tolerance  Visit Diagnosis: Chronic left-sided low back pain, with sciatica presence unspecified - Plan: PT plan of care cert/re-cert  Pain in right hip - Plan: PT plan of care cert/re-cert  Muscle weakness (generalized) - Plan: PT plan of care cert/re-cert  Other symptoms and signs involving the musculoskeletal system - Plan: PT plan of care cert/re-cert  Abnormal posture - Plan: PT plan of care cert/re-cert     Problem List Patient Active Problem List   Diagnosis Date Noted  . Obesity 05/23/2010  . LEG CRAMPS 07/17/2009  . CONSTIPATION, MILD 06/21/2009  . SLEEP APNEA 10/25/2007  . FATTY LIVER DISEASE 09/12/2007  . HYPERTENSION 05/16/2007  . ARTHRITIS 05/16/2007  . DIVERTICULOSIS, COLON 10/18/2006  . DIABETES-TYPE 2 09/05/2006  . HYPERCHOLESTEROLEMIA 09/05/2006  . FACTOR V DEFICIENCY 09/05/2006    Locke Barrell Nilda Simmer PT, MPH   07/20/2017, 4:31 PM  [facility] [place] [address] [place], [place], [address] Phone: [phone]   Fax:  [phone]  Name: Aerin Delany MRN: [id number] Date of Birth: February 09, 1955

## 2017-07-20 NOTE — Patient Instructions (Addendum)
Stretch out strap   HIP: Hamstrings - Supine  Place strap around foot. Raise leg up, keeping knee straight.  Bend opposite knee to protect back if indicated. Hold 30 seconds. 3 reps per set, 2-3 sets per day  Outer Hip Stretch: Reclined IT Band Stretch (Strap)   Strap around one foot, pull leg across body until you feel a pull or stretch in the outside of your hip, with shoulders on mat. Hold for 30 seconds. Repeat 3 times each leg. 2-3 times/day.  Piriformis Stretch   Lying on back, pull right knee toward opposite shoulder. Hold 30 seconds. Repeat 3 times. Do 2-3 sessions per day.   Quads / HF, Supine   Lie near edge of bed, pull both knees up toward chest. Hold one knee as you drop the other leg off the edge of the bed.  Relax hanging knee/can bend knee back if indicated. Hold 30 seconds. Repeat 3 times per session. Do 2-3 sessions per day.  Quads / HF, Prone KNEE: Quadriceps - Prone    Place strap around ankle. Bring ankle toward buttocks. Press hip into surface. Hold 30 seconds. Repeat 3 times per session. Do 2-3 sessions per day.    TENS UNIT: This is helpful for muscle pain and spasm.   Search and Purchase a TENS 7000 2nd edition at www.tenspros.com. It should be less than $30.     TENS unit instructions: Do not shower or bathe with the unit on Turn the unit off before removing electrodes or batteries If the electrodes lose stickiness add a drop of water to the electrodes after they are disconnected from the unit and place on plastic sheet. If you continued to have difficulty, call the TENS unit company to purchase more electrodes. Do not apply lotion on the skin area prior to use. Make sure the skin is clean and dry as this will help prolong the life of the electrodes. After use, always check skin for unusual red areas, rash or other skin difficulties. If there are any skin problems, does not apply electrodes to the same area. Never remove the electrodes from  the unit by pulling the wires. Do not use the TENS unit or electrodes other than as directed. Do not change electrode placement without consultating your therapist or physician. Keep 2 fingers with between each electrode.    Trigger Point Dry Needling  . What is Trigger Point Dry Needling (DN)? o DN is a physical therapy technique used to treat muscle pain and dysfunction. Specifically, DN helps deactivate muscle trigger points (muscle knots).  o A thin filiform needle is used to penetrate the skin and stimulate the underlying trigger point. The goal is for a local twitch response (LTR) to occur and for the trigger point to relax. No medication of any kind is injected during the procedure.   . What Does Trigger Point Dry Needling Feel Like?  o The procedure feels different for each individual patient. Some patients report that they do not actually feel the needle enter the skin and overall the process is not painful. Very mild bleeding may occur. However, many patients feel a deep cramping in the muscle in which the needle was inserted. This is the local twitch response.   Marland Kitchen How Will I feel after the treatment? o Soreness is normal, and the onset of soreness may not occur for a few hours. Typically this soreness does not last longer than two days.  o Bruising is uncommon, however; ice can be used  to decrease any possible bruising.  o In rare cases feeling tired or nauseous after the treatment is normal. In addition, your symptoms may get worse before they get better, this period will typically not last longer than 24 hours.   . What Can I do After My Treatment? o Increase your hydration by drinking more water for the next 24 hours. o You may place ice or heat on the areas treated that have become sore, however, do not use heat on inflamed or bruised areas. Heat often brings more relief post needling. o You can continue your regular activities, but vigorous activity is not recommended  initially after the treatment for 24 hours. o DN is best combined with other physical therapy such as strengthening, stretching, and other therapies.

## 2017-07-24 ENCOUNTER — Encounter: Payer: Self-pay | Admitting: Internal Medicine

## 2017-07-24 MED FILL — SHIPPING COST: 1 days supply | Qty: 1 | Fill #1

## 2017-07-24 MED FILL — SPIRONOLACTONE 25 MG TABLET: 25 | 90 days supply | Qty: 90 | Fill #0

## 2017-07-25 ENCOUNTER — Ambulatory Visit: Payer: 59 | Admitting: Rehabilitative and Restorative Service Providers"

## 2017-07-25 ENCOUNTER — Encounter: Payer: Self-pay | Admitting: Rehabilitative and Restorative Service Providers"

## 2017-07-25 DIAGNOSIS — G8929 Other chronic pain: Secondary | ICD-10-CM

## 2017-07-25 DIAGNOSIS — R29898 Other symptoms and signs involving the musculoskeletal system: Secondary | ICD-10-CM | POA: Diagnosis not present

## 2017-07-25 DIAGNOSIS — M6281 Muscle weakness (generalized): Secondary | ICD-10-CM

## 2017-07-25 DIAGNOSIS — M25551 Pain in right hip: Secondary | ICD-10-CM

## 2017-07-25 DIAGNOSIS — M545 Low back pain: Secondary | ICD-10-CM

## 2017-07-25 DIAGNOSIS — R293 Abnormal posture: Secondary | ICD-10-CM | POA: Diagnosis not present

## 2017-07-25 NOTE — Patient Instructions (Addendum)
Abdominal Bracing With Pelvic Floor (Hook-Lying)    With neutral spine, tighten pelvic floor and abdominals sucking belly button to back bone; tighten muscles in the low back at waist.  Can exhale slowly to kick in the diaphragm.  Hold 10 sec  Repeat _10 __ times. Do __several_ times a day. Progress to do this in sitting standing walking and with functional activities   Pelvic floor specialist - [facility]    Extremity Flexion (Hook-Lying)    Tighten stomach and slowly lower right arm over head until back begins to arch. Keep trunk rigid. Repeat __10__ times per set. Do __1-3__ sets per session. Do _1-2___ sessions per day.     Bent Leg Lift (Hook-Lying)    Tighten core and slowly raise right leg __8-10__ inches from floor. Keep trunk rigid. Hold __1-2__ seconds. Repeat __10__ times per set. Do __1-3__ sets per session. Do __1-2__ sessions per day.

## 2017-07-25 NOTE — Therapy (Addendum)
[facility] [address] [place] [place], [place], [address] Phone: [phone]   Fax:  [phone]  Physical Therapy Treatment  Patient Details  Name: Amanda [name] MRN: [id number] Date of Birth: 1955/12/04 Referring Provider: Danae Orleans PA-C   Encounter Date: 07/25/2017  PT End of Session - 07/25/17 1410    Visit Number  2    Number of Visits  12    Date for PT Re-Evaluation  08/31/17    PT Start Time  1402    PT Stop Time  1504    PT Time Calculation (min)  62 min    Activity Tolerance  Patient tolerated treatment well       Past Medical History:  Diagnosis Date  . Allergy    seasonal  . Arthritis    all over  . CPAP (continuous positive airway pressure) dependence    8 cm water w/ heated humidifier  . Fatty liver 4-08   by [place]  . Gout   . Hip pain   . Hypertension   . Knee pain   . Obesity   . Recurrent UTI     Past Surgical History:  Procedure Laterality Date  . APPENDECTOMY  1977  . [place]   one  . CHOLECYSTECTOMY  1977  . [place]  . reduction mammoplasty  1985  . ROUX-EN-Y GASTRIC BYPASS  2014    There were no vitals filed for this visit.  Subjective Assessment - 07/25/17 1411    Subjective  Thinks the LB and hip feel a little better. She had less pain in the LB and hip at work. Heel lift Lt shoe did seem to help at work. Notices some pain in the lateral Rt knee area this week up to 7/10 pain at times. Working on her exercises at home has a dog leash to use for stretches     Currently in Pain?  Yes    Pain Score  0-No pain    Pain Location  Back    Pain Orientation  Right;Lower    Pain Score  0                       [facility] Adult PT Treatment/Exercise - 07/25/17 0001      Lumbar Exercises: Stretches   Passive Hamstring Stretch  Right;Left;2 reps;30 seconds supine with strap    Hip Flexor Stretch  Right;Left;2 reps;30  seconds sitting x 2 each    Quad Stretch  Right;Left;2 reps;30 seconds prone with strap     ITB Stretch  Right;Left;2 reps;30 seconds supine with strap       Lumbar Exercises: Supine   Bent Knee Raise  10 reps with core engaged    Other Supine Lumbar Exercises  3 part core 10 sec x 10 reps     Other Supine Lumbar Exercises  alternate shoulder flexioin with core engaged x 10 reps each UE       Moist Heat Therapy   Number Minutes Moist Heat  20 Minutes    Moist Heat Location  Lumbar Spine;Hip Rt LB and Rt hip       Electrical Stimulation   Electrical Stimulation Location  Bilat QL; bilat piriformis    Electrical Stimulation Action  IFC    Electrical Stimulation Parameters  to tolerance    Electrical Stimulation Goals  Pain;Tone      Manual Therapy   Manual  therapy comments  pt prone     Joint Mobilization  PA glides bilat hips     Soft tissue mobilization  deep tissue work through bilat lumbar and posterior hip musculature     Myofascial Release  lumbar spine              PT Education - 07/25/17 1443    Education Details  HEP     Person(s) Educated  Patient    Methods  Explanation;Demonstration;Tactile cues;Verbal cues;Handout    Comprehension  Verbalized understanding;Returned demonstration;Verbal cues required;Tactile cues required          PT Long Term Goals - 07/25/17 1417      PT LONG TERM GOAL #1   Title  Improve Rt knee extension to (-) 3 to 5 degrees 08/31/17    Time  6    Period  Weeks    Status  On-going      PT LONG TERM GOAL #2   Title  Increase strength bilat hips to 5/5 in hip extension and abduction 08/31/17    Time  6    Period  Weeks    Status  On-going      PT LONG TERM GOAL #3   Title  Increase core stability allowing patient to preform functional activities and appropriate exercise program with minimal pain (no more than 2/10 to 3/10) 08/31/17    Time  6    Period  Weeks    Status  On-going      PT LONG TERM GOAL #4   Title  Independent  in HEP 08/31/17    Time  6    Period  Weeks    Status  On-going      PT LONG TERM GOAL #5   Title  Improve FOTO to </= 36% limitation 08/31/17    Time  6    Period  Weeks    Status  On-going            Plan - 07/25/17 1418    Clinical Impression Statement  Good response to initial treatment with less back and hip pain reported. Denice Paradise does have some pain in the lateral Rt knee area which is likely related to efforts to get Rt knee into better extension. Reviewed exercises with some correction. Added core stabilization exercise today without difficulty. Some progress toward goals of decreasing pain.     Rehab Potential  Good    PT Frequency  2x / week    PT Duration  6 weeks    PT Treatment/Interventions  Patient/family education;ADLs/Self Care Home Management;Cryotherapy;Electrical Stimulation;Iontophoresis 57m/ml Dexamethasone;Moist Heat;Ultrasound;Dry needling;Manual techniques;Neuromuscular re-education;Therapeutic activities;Therapeutic exercise    PT Next Visit Plan  review HEP; deep tissue work vs DN Rt posterior hip; myofacial ball release posterior hip; progress with core stabilization; stretch Rt knee into extension; continue heel lift Lt shoe; modalities as indicated     Consulted and Agree with Plan of Care  Patient       Patient will benefit from skilled therapeutic intervention in order to improve the following deficits and impairments:  Postural dysfunction, Improper body mechanics, Pain, Increased fascial restricitons, Increased muscle spasms, Hypomobility, Decreased mobility, Decreased range of motion, Decreased strength, Decreased activity tolerance  Visit Diagnosis: Chronic left-sided low back pain, with sciatica presence unspecified  Pain in right hip  Muscle weakness (generalized)  Other symptoms and signs involving the musculoskeletal system  Abnormal posture     Problem List Patient Active Problem List   Diagnosis Date Noted  .  Obesity 05/23/2010  .  LEG CRAMPS 07/17/2009  . CONSTIPATION, MILD 06/21/2009  . SLEEP APNEA 10/25/2007  . FATTY LIVER DISEASE 09/12/2007  . HYPERTENSION 05/16/2007  . ARTHRITIS 05/16/2007  . DIVERTICULOSIS, COLON 10/18/2006  . DIABETES-TYPE 2 09/05/2006  . HYPERCHOLESTEROLEMIA 09/05/2006  . FACTOR V DEFICIENCY 09/05/2006    Anicia Leuthold Nilda Simmer PT, MPH 07/25/2017, 3:09 PM  [facility] [address] [place], [place], [address] Phone: [phone]   Fax:  [phone]  Name: Coni Homesley MRN: [id number] Date of Birth: 12/01/1955  PHYSICAL THERAPY DISCHARGE SUMMARY  Visits from Start of Care: 2  Current functional level related to goals / functional outcomes: See progress note for discharge status    Remaining deficits: Unknown    Education / Equipment: HEP  Plan: Patient agrees to discharge.  Patient goals were not met. Patient is being discharged due to not returning since the last visit.  ?????     Kianni Lheureux P. Helene Kelp PT, MPH 09/13/17 4:02 PM

## 2017-07-27 ENCOUNTER — Encounter: Payer: 59 | Admitting: Physical Therapy

## 2017-08-01 MED FILL — LOSARTAN POTASSIUM 50 MG TA: 50 | 60 days supply | Qty: 120 | Fill #1

## 2017-08-01 MED FILL — SHIPPING COST: 1 days supply | Qty: 1 | Fill #2

## 2017-08-30 MED FILL — ALLOPURINOL 100 MG TABLET: 100 | 90 days supply | Qty: 180 | Fill #1

## 2017-08-30 MED FILL — FERREX 150 CAPSULE: 150 | 90 days supply | Qty: 90 | Fill #1

## 2017-08-30 MED FILL — SHIPPING COST: 1 days supply | Qty: 1 | Fill #3

## 2017-09-04 ENCOUNTER — Ambulatory Visit (AMBULATORY_SURGERY_CENTER): Payer: Self-pay

## 2017-09-04 VITALS — Ht 69.0 in | Wt 206.2 lb

## 2017-09-04 DIAGNOSIS — Z1211 Encounter for screening for malignant neoplasm of colon: Secondary | ICD-10-CM

## 2017-09-04 MED ORDER — NA SULFATE-K SULFATE-MG SULF 17.5-3.13-1.6 GM/177ML PO SOLN: 1.0000 | Freq: Once | ORAL | 0 refills | Status: AC

## 2017-09-04 NOTE — Progress Notes (Signed)
Denies allergies to eggs or soy products. Denies complication of anesthesia or sedation. Denies use of weight loss medication. Denies use of O2.   Emmi instructions declined.   Patient already has her suprep bowel prep. Patient had to reschedule.

## 2017-09-07 ENCOUNTER — Encounter: Payer: Self-pay | Admitting: Internal Medicine

## 2017-09-18 ENCOUNTER — Encounter: Payer: Self-pay | Admitting: Internal Medicine

## 2017-09-18 ENCOUNTER — Ambulatory Visit (AMBULATORY_SURGERY_CENTER): Payer: 59 | Admitting: Internal Medicine

## 2017-09-18 VITALS — BP 148/70 | HR 60 | Temp 97.7°F | Resp 12 | Ht 69.0 in | Wt 206.0 lb

## 2017-09-18 DIAGNOSIS — K635 Polyp of colon: Secondary | ICD-10-CM

## 2017-09-18 DIAGNOSIS — Z1211 Encounter for screening for malignant neoplasm of colon: Secondary | ICD-10-CM

## 2017-09-18 DIAGNOSIS — K219 Gastro-esophageal reflux disease without esophagitis: Secondary | ICD-10-CM | POA: Diagnosis not present

## 2017-09-18 DIAGNOSIS — D123 Benign neoplasm of transverse colon: Secondary | ICD-10-CM

## 2017-09-18 DIAGNOSIS — R197 Diarrhea, unspecified: Secondary | ICD-10-CM | POA: Diagnosis not present

## 2017-09-18 DIAGNOSIS — I10 Essential (primary) hypertension: Secondary | ICD-10-CM | POA: Diagnosis not present

## 2017-09-18 MED ORDER — SODIUM CHLORIDE 0.9 % IV SOLN: 500.0000 mL | Freq: Once | INTRAVENOUS | Status: DC

## 2017-09-18 NOTE — Progress Notes (Signed)
Called to room to assist during endoscopic procedure.  Patient ID and intended procedure confirmed with present staff. Received instructions for my participation in the procedure from the performing physician.  

## 2017-09-18 NOTE — Progress Notes (Signed)
Pt's states no medical or surgical changes since previsit or office visit. 

## 2017-09-18 NOTE — Patient Instructions (Signed)
Handouts given on polyps and diverticulosis. Random biopsies were taken today.  YOU HAD AN ENDOSCOPIC PROCEDURE TODAY AT [facility]:   Refer to the procedure report that was given to you for any specific questions about what was found during the examination.  If the procedure report does not answer your questions, please call your gastroenterologist to clarify.  If you requested that your care partner not be given the details of your procedure findings, then the procedure report has been included in a sealed envelope for you to review at your convenience later.  YOU SHOULD EXPECT: Some feelings of bloating in the abdomen. Passage of more gas than usual.  Walking can help get rid of the air that was put into your GI tract during the procedure and reduce the bloating. If you had a lower endoscopy (such as a colonoscopy or flexible sigmoidoscopy) you may notice spotting of blood in your stool or on the toilet paper. If you underwent a bowel prep for your procedure, you may not have a normal bowel movement for a few days.  Please Note:  You might notice some irritation and congestion in your nose or some drainage.  This is from the oxygen used during your procedure.  There is no need for concern and it should clear up in a day or so.  SYMPTOMS TO REPORT IMMEDIATELY:   Following lower endoscopy (colonoscopy or flexible sigmoidoscopy):  Excessive amounts of blood in the stool  Significant tenderness or worsening of abdominal pains  Swelling of the abdomen that is new, acute  Fever of 100F or higher   For urgent or emergent issues, a gastroenterologist can be reached at any hour by calling [phone].   DIET:  We do recommend a small meal at first, but then you may proceed to your regular diet.  Drink plenty of fluids but you should avoid alcoholic beverages for 24 hours.  ACTIVITY:  You should plan to take it easy for the rest of today and you should NOT DRIVE or use heavy  machinery until tomorrow (because of the sedation medicines used during the test).    FOLLOW UP: Our staff will call the number listed on your records the next business day following your procedure to check on you and address any questions or concerns that you may have regarding the information given to you following your procedure. If we do not reach you, we will leave a message.  However, if you are feeling well and you are not experiencing any problems, there is no need to return our call.  We will assume that you have returned to your regular daily activities without incident.  If any biopsies were taken you will be contacted by phone or by letter within the next 1-3 weeks.  Please call us at [phone] if you have not heard about the biopsies in 3 weeks.    SIGNATURES/CONFIDENTIALITY: You and/or your care partner have signed paperwork which will be entered into your electronic medical record.  These signatures attest to the fact that that the information above on your After Visit Summary has been reviewed and is understood.  Full responsibility of the confidentiality of this discharge information lies with you and/or your care-partner.

## 2017-09-18 NOTE — Progress Notes (Signed)
Report to PACU, RN, vss, BBS= Clear.  

## 2017-09-18 NOTE — Op Note (Signed)
[facility] Patient Name: Amanda [name] Procedure Date: 09/18/2017 2:57 PM MRN: [id number] Endoscopist: Docia Chuck. Henrene Pastor , MD Age: 62 Referring MD:  Date of Birth: 1955/10/31 Gender: Female Account #: [id number] Procedure:                Colonoscopy, with biopsies and cold snare                            polypectomy x 1 Indications:              Screening for colorectal malignant neoplasm,                            Incidental diarrhea noted. Negative index exam 2008 Medicines:                Monitored Anesthesia Care Procedure:                Pre-Anesthesia Assessment:                           - Prior to the procedure, a History and Physical                            was performed, and patient medications and                            allergies were reviewed. The patient's tolerance of                            previous anesthesia was also reviewed. The risks                            and benefits of the procedure and the sedation                            options and risks were discussed with the patient.                            All questions were answered, and informed consent                            was obtained. Prior Anticoagulants: The patient has                            taken no previous anticoagulant or antiplatelet                            agents. ASA Grade Assessment: II - A patient with                            mild systemic disease. After reviewing the risks                            and benefits, the patient was deemed in  satisfactory condition to undergo the procedure.                           After obtaining informed consent, the colonoscope                            was passed under direct vision. Throughout the                            procedure, the patient's blood pressure, pulse, and                            oxygen saturations were monitored continuously. The                            Model CF-HQ190L  [id number]) scope was introduced                            through the anus and advanced to the the cecum,                            identified by appendiceal orifice and ileocecal                            valve. The ileocecal valve, appendiceal orifice,                            and rectum were photographed. The quality of the                            bowel preparation was excellent. The colonoscopy                            was performed without difficulty. The patient                            tolerated the procedure well. The bowel preparation                            used was SUPREP. Scope In: 3:06:12 PM Scope Out: 3:19:39 PM Scope Withdrawal Time: 0 hours 8 minutes 47 seconds  Total Procedure Duration: 0 hours 13 minutes 27 seconds  Findings:                 A 4 mm polyp was found in the transverse colon. The                            polyp was removed with a cold snare. Resection and                            retrieval were complete.                           Diverticula were found in the left colon.  The entire examined colon appeared otherwise normal                            on direct and retroflexion views. Biopsies for                            histology were taken with a cold forceps from the                            entire colon for evaluation of microscopic colitis. Complications:            No immediate complications. Estimated blood loss:                            None. Estimated Blood Loss:     Estimated blood loss: none. Impression:               - One 4 mm polyp in the transverse colon, removed                            with a cold snare. Resected and retrieved.                           - Diverticulosis in the left colon.                           - The entire examined colon is otherwise normal on                            direct and retroflexion views. Recommendation:           - Repeat colonoscopy in 5 or 10 years for                             surveillance, based on final pathology result.                           - Patient has a contact number available for                            emergencies. The signs and symptoms of potential                            delayed complications were discussed with the                            patient. Return to normal activities tomorrow.                            Written discharge instructions were provided to the                            patient.                           -  Resume previous diet.                           - Continue present medications.                           - Await pathology results. Docia Chuck. Henrene Pastor, MD 09/18/2017 3:24:25 PM This report has been signed electronically.

## 2017-09-19 ENCOUNTER — Telehealth: Payer: Self-pay

## 2017-09-19 NOTE — Telephone Encounter (Signed)
  Follow up Call-  Call back number 09/18/2017  Post procedure Call Back phone  # [phone] cell  Permission to leave phone message Yes  Some recent data might be hidden     Patient questions:  Do you have a fever, pain , or abdominal swelling? No. Pain Score  0 *  Have you tolerated food without any problems? Yes.    Have you been able to return to your normal activities? Yes.    Do you have any questions about your discharge instructions: Diet   No. Medications  No. Follow up visit  No.  Do you have questions or concerns about your Care? No.  Actions: * If pain score is 4 or above: No action needed, pain <4.  Patient states she passed a large amount of gas and some mucous in the night, but otherwise is doing well.  Advised that this is normal.

## 2017-09-25 ENCOUNTER — Encounter: Payer: Self-pay | Admitting: Internal Medicine

## 2017-10-01 DIAGNOSIS — E119 Type 2 diabetes mellitus without complications: Secondary | ICD-10-CM | POA: Diagnosis not present

## 2017-10-01 DIAGNOSIS — R82998 Other abnormal findings in urine: Secondary | ICD-10-CM | POA: Diagnosis not present

## 2017-10-01 DIAGNOSIS — Z1231 Encounter for screening mammogram for malignant neoplasm of breast: Secondary | ICD-10-CM | POA: Diagnosis not present

## 2017-10-01 DIAGNOSIS — Z0000 Encounter for general adult medical examination without abnormal findings: Secondary | ICD-10-CM | POA: Diagnosis not present

## 2017-10-01 DIAGNOSIS — I10 Essential (primary) hypertension: Secondary | ICD-10-CM | POA: Diagnosis not present

## 2017-10-01 DIAGNOSIS — R300 Dysuria: Secondary | ICD-10-CM | POA: Diagnosis not present

## 2017-10-01 DIAGNOSIS — Z79899 Other long term (current) drug therapy: Secondary | ICD-10-CM | POA: Diagnosis not present

## 2017-10-09 MED FILL — SHIPPING COST: 1 days supply | Qty: 1 | Fill #4

## 2017-10-09 MED FILL — LOSARTAN POTASSIUM 50 MG TA: 50 | 30 days supply | Qty: 60 | Fill #0

## 2017-10-09 MED FILL — IPRATROPIUM 0.06% SPRAY: 0.06 | 13 days supply | Qty: 15 | Fill #1

## 2017-10-18 DIAGNOSIS — N289 Disorder of kidney and ureter, unspecified: Secondary | ICD-10-CM | POA: Diagnosis not present

## 2017-10-18 DIAGNOSIS — R82998 Other abnormal findings in urine: Secondary | ICD-10-CM | POA: Diagnosis not present

## 2017-10-18 DIAGNOSIS — D649 Anemia, unspecified: Secondary | ICD-10-CM | POA: Diagnosis not present

## 2017-10-26 MED FILL — SHIPPING COST: 1 days supply | Qty: 1 | Fill #5

## 2017-10-26 MED FILL — SPIRONOLACTONE 25 MG TABS: 25 | 90 days supply | Qty: 90 | Fill #0

## 2017-11-01 DIAGNOSIS — Z1231 Encounter for screening mammogram for malignant neoplasm of breast: Secondary | ICD-10-CM | POA: Diagnosis not present

## 2017-11-12 MED FILL — SHIPPING COST: 1 days supply | Qty: 1 | Fill #6

## 2017-11-12 MED FILL — LOSARTAN POTASSIUM 50 MG TA: 50 | 60 days supply | Qty: 120 | Fill #1

## 2017-12-04 MED FILL — ALLOPURINOL 100 MG TABLET: 100 | 90 days supply | Qty: 180 | Fill #0

## 2017-12-04 MED FILL — SHIPPING COST: 1 days supply | Qty: 1 | Fill #7

## 2018-01-11 MED FILL — LOSARTAN POTASSIUM 50 MG TA: 50 | 90 days supply | Qty: 180 | Fill #0

## 2018-01-11 MED FILL — SHIPPING COST: 1 days supply | Qty: 1 | Fill #8

## 2018-02-01 DIAGNOSIS — M170 Bilateral primary osteoarthritis of knee: Secondary | ICD-10-CM | POA: Diagnosis not present

## 2018-02-04 MED FILL — SHIPPING COST: 1 days supply | Qty: 1 | Fill #9

## 2018-02-04 MED FILL — SPIRONOLACTONE 25 MG TABS: 25 | 90 days supply | Qty: 90 | Fill #1

## 2018-03-04 ENCOUNTER — Encounter: Payer: Self-pay | Admitting: Physician Assistant

## 2018-03-04 ENCOUNTER — Ambulatory Visit (INDEPENDENT_AMBULATORY_CARE_PROVIDER_SITE_OTHER): Payer: No Typology Code available for payment source | Admitting: Physician Assistant

## 2018-03-04 VITALS — BP 109/68 | HR 75 | Temp 97.9°F | Wt 219.0 lb

## 2018-03-04 DIAGNOSIS — I10 Essential (primary) hypertension: Secondary | ICD-10-CM

## 2018-03-04 DIAGNOSIS — M170 Bilateral primary osteoarthritis of knee: Secondary | ICD-10-CM | POA: Diagnosis not present

## 2018-03-04 DIAGNOSIS — G8929 Other chronic pain: Secondary | ICD-10-CM

## 2018-03-04 DIAGNOSIS — M25561 Pain in right knee: Secondary | ICD-10-CM | POA: Diagnosis not present

## 2018-03-04 DIAGNOSIS — M25562 Pain in left knee: Secondary | ICD-10-CM

## 2018-03-04 DIAGNOSIS — Z9884 Bariatric surgery status: Secondary | ICD-10-CM

## 2018-03-04 DIAGNOSIS — M1A4720 Other secondary chronic gout, left ankle and foot, without tophus (tophi): Secondary | ICD-10-CM

## 2018-03-04 MED ORDER — ALLOPURINOL 100 MG PO TABS: 100.0000 mg | ORAL_TABLET | Freq: Every day | ORAL | 4 refills | Status: DC

## 2018-03-04 MED FILL — SHIPPING COST: 1 days supply | Qty: 1 | Fill #10

## 2018-03-04 MED FILL — ALLOPURINOL 100 MG TABS: 100 | 90 days supply | Qty: 90 | Fill #0

## 2018-03-04 NOTE — Progress Notes (Signed)
New Patient Office Visit  Subjective:  Patient ID: Amanda [name], female    DOB: 1955-12-16  Age: 63 y.o. MRN: [id number]  CC:  Chief Complaint  Patient presents with  . Establish Care    HPI Amanda [name] presents to establish care and get referrals through the focus plan.   Pt does not need refills today except on allopurinol.   She sees [facility] and needs referral for the new year due to her bilateral chronic knee pain due to OA. Her last injection was December 27th. Her next appt is feb 14th. Cannot take NSAID due to gastric bypass. Tried voltaren gel and did not help.     Past Medical History:  Diagnosis Date  . Allergy    seasonal  . Arthritis    all over  . CPAP (continuous positive airway pressure) dependence    8 cm water w/ heated humidifier  . Diabetes mellitus without complication ([place])    Pre surgery - Gastric sx 2014 Roun-Y no diabetes since  . Fatty liver 4-08   by [place]  . Gout   . Hip pain   . Hypertension   . Knee pain   . Obesity   . Recurrent UTI     Past Surgical History:  Procedure Laterality Date  . APPENDECTOMY  1977  . [place]   one  . CHOLECYSTECTOMY  1977  . [facility]  . reduction mammoplasty  1985  . ROUX-EN-Y GASTRIC BYPASS  2014    Family History  Problem Relation Age of Onset  . Diabetes Mother   . Hypertension Mother   . Cancer Mother        breast- partial mastectomy- in her 34's  . Heart attack Father   . Hypertension Father   . Arthritis Father   . Gout Father   . Parkinsonism Father   . Alcohol abuse Maternal Grandfather   . Colon cancer Neg Hx   . Esophageal cancer Neg Hx   . Stomach cancer Neg Hx   . Rectal cancer Neg Hx     Social History   Socioeconomic History  . Marital status: Married    Spouse name: Not on file  . Number of children: Not on file  . Years of education: Not on file  . Highest education level: Not on file  Occupational  History  . Not on file  Social Needs  . Financial resource strain: Not on file  . Food insecurity:    Worry: Not on file    Inability: Not on file  . Transportation needs:    Medical: Not on file    Non-medical: Not on file  Tobacco Use  . Smoking status: Former Smoker    Types: Cigarettes    Last attempt to quit: 2001    Years since quitting: 19.0  . Smokeless tobacco: Never Used  Substance and Sexual Activity  . Alcohol use: Yes    Comment: occasional  . Drug use: No  . Sexual activity: Not on file  Lifestyle  . Physical activity:    Days per week: Not on file    Minutes per session: Not on file  . Stress: Not on file  Relationships  . Social connections:    Talks on phone: Not on file    Gets together: Not on file    Attends religious service: Not on file    Active member of club or  organization: Not on file    Attends meetings of clubs or organizations: Not on file    Relationship status: Not on file  . Intimate partner violence:    Fear of current or ex partner: Not on file    Emotionally abused: Not on file    Physically abused: Not on file    Forced sexual activity: Not on file  Other Topics Concern  . Not on file  Social History Narrative  . Not on file    ROS Review of Systems  Objective:   Today's Vitals: BP 109/68   Pulse 75   Temp 97.9 F (36.6 C) (Oral)   Wt 219 lb (99.3 kg)   BMI 32.34 kg/m   Physical Exam Vitals signs reviewed.  Constitutional:      Appearance: Normal appearance.  HENT:     Head: Normocephalic and atraumatic.  Cardiovascular:     Rate and Rhythm: Normal rate and regular rhythm.     Pulses: Normal pulses.  Pulmonary:     Effort: Pulmonary effort is normal.     Breath sounds: Normal breath sounds.  Neurological:     General: No focal deficit present.     Mental Status: She is alert and oriented to person, place, and time.  Psychiatric:        Mood and Affect: Mood normal.        Behavior: Behavior normal.      Assessment & Plan:   Problem List Items Addressed This Visit      Unprioritized   Essential hypertension - Primary   Status post laparoscopic sleeve gastrectomy    Other Visit Diagnoses    Chronic pain of both knees          Outpatient Encounter Medications as of 03/04/2018  Medication Sig  . allopurinol (ZYLOPRIM) 100 MG tablet Take 1 tablet (100 mg total) by mouth daily.  . Biotin 10000 MCG TABS Take by mouth daily.  . calcium citrate (CALCITRATE - DOSED IN MG ELEMENTAL CALCIUM) 950 MG tablet Take 200 mg of elemental calcium by mouth daily.  . cholecalciferol (VITAMIN D) 1000 units tablet Take 2,000 Units by mouth daily.  Marland Kitchen ibuprofen (ADVIL,MOTRIN) 200 MG tablet Take 200 mg by mouth as needed.  Marland Kitchen losartan (COZAAR) 50 MG tablet Take 50 mg by mouth 2 (two) times daily.  . Multiple Vitamins-Minerals (CENTRUM SILVER ULTRA WOMENS) TABS Take 1 tablet by mouth 2 (two) times daily.   Marland Kitchen omeprazole (PRILOSEC) 20 MG capsule Take 20 mg by mouth 2 (two) times daily before a meal.  . Polysaccharide Iron Complex (POLY-IRON 150 PO) Take by mouth daily at 6 (six) AM.  . spironolactone (ALDACTONE) 25 MG tablet Take 25 mg by mouth daily.  . [DISCONTINUED] allopurinol (ZYLOPRIM) 100 MG tablet TAKE 1 TABLET BY MOUTH ONCE DAILY   No facility-administered encounter medications on file as of 03/04/2018.    Marland KitchenMechele [name] was seen today for establish care.  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   Pt has up to date labs.  Consider sample of pennsaid and see if helps  better than voltaren. If so can get rx.   Need  to get copies of Tdap and mammogram/pap.   Follow-up: Return in about 6 months (around 09/02/2018), or if symptoms worsen or fail to improve, for Needs CPE.   Iran Planas, PA-C

## 2018-03-05 ENCOUNTER — Telehealth: Payer: Self-pay | Admitting: Physician Assistant

## 2018-03-05 DIAGNOSIS — M25561 Pain in right knee: Secondary | ICD-10-CM

## 2018-03-05 DIAGNOSIS — M109 Gout, unspecified: Secondary | ICD-10-CM | POA: Insufficient documentation

## 2018-03-05 DIAGNOSIS — M25562 Pain in left knee: Secondary | ICD-10-CM

## 2018-03-05 DIAGNOSIS — M170 Bilateral primary osteoarthritis of knee: Secondary | ICD-10-CM | POA: Insufficient documentation

## 2018-03-05 DIAGNOSIS — G8929 Other chronic pain: Secondary | ICD-10-CM | POA: Insufficient documentation

## 2018-03-05 NOTE — Telephone Encounter (Signed)
I don't think I gave her sample of pennsaid. Can call and see if she wants to pick up one box. Check first to see if T has one box left at his desk. If she like it we could get her some.

## 2018-03-05 NOTE — Telephone Encounter (Signed)
Pt interested in samples.   2 boxes placed up front for pt.

## 2018-04-17 ENCOUNTER — Other Ambulatory Visit: Payer: Self-pay

## 2018-04-17 MED ORDER — LOSARTAN POTASSIUM 50 MG PO TABS: 50.0000 mg | ORAL_TABLET | Freq: Two times a day (BID) | ORAL | 0 refills | Status: DC

## 2018-04-17 MED FILL — LOSARTAN POTASSIUM 50 MG TA: 50 | 30 days supply | Qty: 60 | Fill #0

## 2018-04-17 MED FILL — SHIPPING COST: 1 days supply | Qty: 1 | Fill #11

## 2018-04-17 NOTE — Telephone Encounter (Signed)
Pharmacy requesting RF be sent for:  Losartan Potassium 50 mg tabs, 1 tab PO BID   RX previously written by Dr Glenda Chroman pended for provider review. Please send if appropriate

## 2018-05-01 MED FILL — SPIRONOLACTONE 25 MG TABLET: 25 | 90 days supply | Qty: 90 | Fill #0

## 2018-05-16 ENCOUNTER — Other Ambulatory Visit: Payer: Self-pay | Admitting: Physician Assistant

## 2018-05-16 MED FILL — LOSARTAN POTASSIUM 50 MG TA: 50 | 30 days supply | Qty: 60 | Fill #0

## 2018-06-07 ENCOUNTER — Telehealth: Payer: Self-pay | Admitting: Physician Assistant

## 2018-06-07 ENCOUNTER — Encounter: Payer: Self-pay | Admitting: Physician Assistant

## 2018-06-07 ENCOUNTER — Ambulatory Visit (INDEPENDENT_AMBULATORY_CARE_PROVIDER_SITE_OTHER): Payer: No Typology Code available for payment source | Admitting: Physician Assistant

## 2018-06-07 VITALS — BP 137/76 | HR 66 | Wt 202.0 lb

## 2018-06-07 DIAGNOSIS — R5383 Other fatigue: Secondary | ICD-10-CM

## 2018-06-07 DIAGNOSIS — R531 Weakness: Secondary | ICD-10-CM

## 2018-06-07 NOTE — Telephone Encounter (Signed)
Can we triage this? See if she has a BP cuff at home. She if she is eating and drinking enough? New meds started etc. I have some openings this morning.

## 2018-06-07 NOTE — Progress Notes (Signed)
Patient ID: Amanda [name], female   DOB: [birth year], 63 y.o.   MRN: [id number] .Marland KitchenVirtual Visit via Telephone Note  I connected with Dione Plover on 06/07/18 at 11:30 AM EDT by telephone and verified that I am speaking with the correct person using two identifiers.  Location: Patient: home Provider: home   I discussed the limitations, risks, security and privacy concerns of performing an evaluation and management service by telephone and the availability of in person appointments. I also discussed with the patient that there may be a patient responsible charge related to this service. The patient expressed understanding and agreed to proceed.   History of Present Illness: Pt is a 63 yo female with HTN and status post gastric bypass rolux En Y years ago who calls into the clinic to discuss fatigue and weakness. She has noticed this for the last 2 weeks. She denies any depression. Denies any sOB, cough, swelling. 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 Fatigue and weakness is not constant but rather intermittent. She has had no medication changes but did start melatonin 3 months ago to help with sleep. She is sleeping great. No fever, chills, body aches.   .. Active Ambulatory Problems    Diagnosis Date Noted  . HYPERCHOLESTEROLEMIA 09/05/2006  . FACTOR V DEFICIENCY 09/05/2006  . Essential hypertension 05/16/2007  . DIVERTICULOSIS, COLON 10/18/2006  . CONSTIPATION, MILD 06/21/2009  . FATTY LIVER DISEASE 09/12/2007  . ARTHRITIS 05/16/2007  . LEG CRAMPS 07/17/2009  . Obesity 05/23/2010  . Status post laparoscopic sleeve gastrectomy 03/04/2018  . Bilateral primary osteoarthritis of knee 03/05/2018  . Gout 03/05/2018  . Chronic pain of both knees 03/05/2018   Resolved Ambulatory Problems    Diagnosis Date Noted  . Acute cystitis 07/17/2009  . SLEEP APNEA 10/25/2007   Past Medical History:  Diagnosis Date  .  Allergy   . Arthritis   . CPAP (continuous positive airway pressure) dependence   . Diabetes mellitus without complication ([place])   . Fatty liver 4-08  . Hip pain   . Hypertension   . Knee pain   . Recurrent UTI    Reviewed med, allergy, problem list.    Observations/Objective: No acute distress.  Normal mood.  Normal breathing.   .. Today's Vitals   06/07/18 1135  BP: 137/76  Pulse: 66  Weight: 202 lb (91.6 kg)   Body mass index is 29.83 kg/m.   Assessment and Plan: Marland KitchenMarland KitchenDiagnoses and all orders for this visit:  Weakness -     TSH -     COMPLETE METABOLIC PANEL WITH GFR -     CBC with Differential/Platelet -     Ferritin -     B12 and Folate Panel -     VITAMIN D 25 Hydroxy (Vit-D Deficiency, Fractures)  No energy -     TSH -     COMPLETE METABOLIC PANEL WITH GFR -     CBC with Differential/Platelet -     Ferritin -     B12 and Folate Panel -     VITAMIN D 25 Hydroxy (Vit-D Deficiency, Fractures)   Will start with labs to look at fatigue panel. Unclear etiology at this point but seems to be fairly sudden. Does not appear to be mood related like depression. Certainly vitamins could be off due to gastric bypass. Will follow up after labs. No signs of symptoms of infection.   Follow Up  Instructions:    I discussed the assessment and treatment plan with the patient. The patient was provided an opportunity to ask questions and all were answered. The patient agreed with the plan and demonstrated an understanding of the instructions.   The patient was advised to call back or seek an in-person evaluation if the symptoms worsen or if the condition fails to improve as anticipated.  I provided 25 minutes of non-face-to-face time during this encounter.   Iran Planas, PA-C

## 2018-06-07 NOTE — Telephone Encounter (Signed)
Pt called. She's been experiencing some incontinence but more importanly she has no energy and when she standing up to wash dishes she feels light headed and dizzy

## 2018-06-07 NOTE — Telephone Encounter (Signed)
SX started 1-2 weeks ago  Lethargic, fatigues, hot/cold flashes but no fever  Lightheaded when standing up, in past this was due to low iron  Has gained about 50 pounds over last couple years and want to discuss with her  Hard time sleeping, added Melatonin QHS  Patient has been added today for virtual visit with Jade. See appt for additional details

## 2018-06-07 NOTE — Progress Notes (Signed)
SX started 1-2 weeks ago  Lethargic, fatigues, hot/cold flashes but no fever  Lightheaded when standing up, in past this was due to low iron  Has gained about 50 pounds over last couple years and want to discuss with her  Hard time sleeping, added Melatonin QHS

## 2018-06-08 LAB — COMPLETE METABOLIC PANEL WITH GFR
AG Ratio: 1.5 (calc) (ref 1.0–2.5)
ALT: 16 U/L (ref 6–29)
AST: 16 U/L (ref 10–35)
Albumin: 4.3 g/dL (ref 3.6–5.1)
Alkaline phosphatase (APISO): 79 U/L (ref 37–153)
BUN: 21 mg/dL (ref 7–25)
CO2: 28 mmol/L (ref 20–32)
Calcium: 10.7 mg/dL — ABNORMAL HIGH (ref 8.6–10.4)
Chloride: 104 mmol/L (ref 98–110)
Creat: 0.97 mg/dL (ref 0.50–0.99)
GFR, Est African American: 73 mL/min/{1.73_m2} (ref >=60)
GFR, Est Non African American: 63 mL/min/{1.73_m2} (ref >=60)
Globulin: 2.9 g/dL (calc) (ref 1.9–3.7)
Glucose, Bld: 98 mg/dL (ref 65–99)
Potassium: 5.1 mmol/L (ref 3.5–5.3)
Sodium: 140 mmol/L (ref 135–146)
Total Bilirubin: 0.5 mg/dL (ref 0.2–1.2)
Total Protein: 7.2 g/dL (ref 6.1–8.1)

## 2018-06-08 LAB — CBC WITH DIFFERENTIAL/PLATELET
Absolute Monocytes: 791 cells/uL (ref 200–950)
Basophils Absolute: 80 cells/uL (ref 0–200)
Basophils Relative: 0.6 %
Eosinophils Absolute: 94 cells/uL (ref 15–500)
Eosinophils Relative: 0.7 %
HCT: 42.1 % (ref 35.0–45.0)
Hemoglobin: 14.3 g/dL (ref 11.7–15.5)
Lymphs Abs: 5695 cells/uL — ABNORMAL HIGH (ref 850–3900)
MCH: 31.3 pg (ref 27.0–33.0)
MCHC: 34 g/dL (ref 32.0–36.0)
MCV: 92.1 fL (ref 80.0–100.0)
MPV: 10.8 fL (ref 7.5–12.5)
Monocytes Relative: 5.9 %
Neutro Abs: 6740 cells/uL (ref 1500–7800)
Neutrophils Relative %: 50.3 %
Platelets: 245 10*3/uL (ref 140–400)
RBC: 4.57 10*6/uL (ref 3.80–5.10)
RDW: 12.8 % (ref 11.0–15.0)
Total Lymphocyte: 42.5 %
WBC: 13.4 10*3/uL — ABNORMAL HIGH (ref 3.8–10.8)

## 2018-06-08 LAB — VITAMIN D 25 HYDROXY (VIT D DEFICIENCY, FRACTURES): Vit D, 25-Hydroxy: 34 ng/mL (ref 30–100)

## 2018-06-08 LAB — B12 AND FOLATE PANEL
Folate: >24 ng/mL
Vitamin B-12: 1054 pg/mL (ref 200–1100)

## 2018-06-08 LAB — FERRITIN: Ferritin: 43 ng/mL (ref 16–288)

## 2018-06-08 LAB — TSH: TSH: 1.19 mIU/L (ref 0.40–4.50)

## 2018-06-10 ENCOUNTER — Encounter: Payer: Self-pay | Admitting: Physician Assistant

## 2018-06-10 NOTE — Progress Notes (Signed)
Call pt: thyroid is perfect. Sugars great. Kidney function wonderful. Calcium the same and stable. Liver enzymes great. Iron stores good. b12 wonderful. Vitamin D normal range and good. WBC a little high with elevated lymphocytes. You could be fighting a viral infection causing ongoing fatigue. No anemia or other causes for fatigue. Labs look really good. Would you like to do testing for CMV and EBV for further work up?

## 2018-06-13 MED FILL — ALLOPURINOL 100 MG TABS: 100 | 90 days supply | Qty: 90 | Fill #1

## 2018-06-19 ENCOUNTER — Encounter: Payer: Self-pay | Admitting: Physician Assistant

## 2018-06-27 ENCOUNTER — Encounter: Payer: Self-pay | Admitting: Physician Assistant

## 2018-06-28 MED ORDER — CLARITHROMYCIN 500 MG PO TABS: 500.0000 mg | ORAL_TABLET | Freq: Two times a day (BID) | ORAL | 0 refills | Status: DC

## 2018-06-28 MED ORDER — OMEPRAZOLE 40 MG PO CPDR: 40.0000 mg | DELAYED_RELEASE_CAPSULE | Freq: Every day | ORAL | 0 refills | Status: DC

## 2018-06-28 MED ORDER — METRONIDAZOLE 500 MG PO TABS: 500.0000 mg | ORAL_TABLET | Freq: Two times a day (BID) | ORAL | 0 refills | Status: DC

## 2018-06-28 MED FILL — CLARITHROMYCIN 500 MG TAB: 500 | 14 days supply | Qty: 28 | Fill #0

## 2018-06-28 MED FILL — OMEPRAZOLE 40 MG CPDR: 40 | 28 days supply | Qty: 28 | Fill #0

## 2018-06-28 MED FILL — METRONIDAZOLE 500 MG TABS: 500 | 14 days supply | Qty: 28 | Fill #0

## 2018-07-04 MED FILL — LOSARTAN POTASSIUM 50 MG TA: 50 | 30 days supply | Qty: 60 | Fill #1

## 2018-07-12 MED FILL — SPIRONOLACTONE 25 MG TABS: 25 | 90 days supply | Qty: 90 | Fill #1

## 2018-07-22 ENCOUNTER — Ambulatory Visit (INDEPENDENT_AMBULATORY_CARE_PROVIDER_SITE_OTHER): Payer: No Typology Code available for payment source | Admitting: Physician Assistant

## 2018-07-22 ENCOUNTER — Encounter: Payer: Self-pay | Admitting: Physician Assistant

## 2018-07-22 VITALS — BP 114/72 | HR 68 | Temp 97.7°F | Ht 67.0 in | Wt 207.0 lb

## 2018-07-22 DIAGNOSIS — B351 Tinea unguium: Secondary | ICD-10-CM | POA: Diagnosis not present

## 2018-07-22 DIAGNOSIS — Z23 Encounter for immunization: Secondary | ICD-10-CM

## 2018-07-22 DIAGNOSIS — R1013 Epigastric pain: Secondary | ICD-10-CM

## 2018-07-22 DIAGNOSIS — M2042 Other hammer toe(s) (acquired), left foot: Secondary | ICD-10-CM | POA: Diagnosis not present

## 2018-07-22 MED ORDER — CICLOPIROX 8 % EX SOLN: Freq: Every day | CUTANEOUS | 0 refills | Status: DC

## 2018-07-22 NOTE — Progress Notes (Signed)
Subjective:    Patient ID: Amanda [name], female    DOB: 20-Nov-1955, 63 y.o.   MRN: [id number]  HPI  Pt is a 63 yo female hx of gastric sleeve surgery presents to the clinic to follow up on epigastric pain. She called in virtually during pandemic. Due to sudden severe nature treated empirically for h.pylori. she does not have a hx of GERD. After treatment most all of her symptoms have resolved. hgb and lipase were both good when checked. She denies any melena or hematochezia. No nausea. Denies any increase in alcohol. She has been using NSAIDs a little more regularly with her back hurting more recently. She denies any family or personal hx of esophageal, pancreatic, stomach cancer. She has been off all medications for the last 12 days.   She does mention her left second toe bothering her more and more over the last 6 months. She has noticed her ugly hammer toe for years.no new trauma. She mentions her bilateral great toenail thick and yellow.    Pt request letter with my suggestion of her working with COVID patients due to her close exposure with husband.   .. Active Ambulatory Problems    Diagnosis Date Noted  . HYPERCHOLESTEROLEMIA 09/05/2006  . FACTOR V DEFICIENCY 09/05/2006  . Essential hypertension 05/16/2007  . DIVERTICULOSIS, COLON 10/18/2006  . CONSTIPATION, MILD 06/21/2009  . FATTY LIVER DISEASE 09/12/2007  . ARTHRITIS 05/16/2007  . LEG CRAMPS 07/17/2009  . Obesity 05/23/2010  . Status post laparoscopic sleeve gastrectomy 03/04/2018  . Bilateral primary osteoarthritis of knee 03/05/2018  . Gout 03/05/2018  . Chronic pain of both knees 03/05/2018  . Hammer toe of left foot 07/23/2018  . Epigastric pain 07/23/2018  . Onychomycosis 07/23/2018   Resolved Ambulatory Problems    Diagnosis Date Noted  . Acute cystitis 07/17/2009  . SLEEP APNEA 10/25/2007   Past Medical History:  Diagnosis Date  . Allergy   . Arthritis   . CPAP (continuous positive airway pressure)  dependence   . Diabetes mellitus without complication ([place])   . Fatty liver 4-08  . Hip pain   . Hypertension   . Knee pain   . Recurrent UTI     Review of Systems See HPI.     Objective:   Physical Exam Vitals signs reviewed.  Constitutional:      Appearance: Normal appearance.  Neck:     Musculoskeletal: Normal range of motion.  Cardiovascular:     Rate and Rhythm: Normal rate and regular rhythm.  Pulmonary:     Effort: Pulmonary effort is normal.     Breath sounds: Normal breath sounds.  Abdominal:     General: There is no distension.     Palpations: Abdomen is soft.     Tenderness: There is abdominal tenderness. There is no guarding.     Hernia: No hernia is present.     Comments: Very mild tenderness over epigastric area.   Musculoskeletal:     Comments: Left 2nd hammer toe.  Bilateral great toenails thick, yellow and area where appears to be pulling from nailbed.   Neurological:     General: No focal deficit present.     Mental Status: She is alert and oriented to person, place, and time.  Psychiatric:        Mood and Affect: Mood normal.        Behavior: Behavior normal.           Assessment & Plan:  .Marland KitchenCatarina was  seen today for gastroesophageal reflux.  Diagnoses and all orders for this visit:  Epigastric pain -     H. pylori breath test  Hammer toe of left foot -     Ambulatory referral to Podiatry  Need for vaccine for Td (tetanus-diphtheria) -     Tdap vaccine greater than or equal to 7yo IM  Need for shingles vaccine -     Varicella-zoster vaccine IM (Shingrix)  Onychomycosis -     ciclopirox (PENLAC) 8 % solution; Apply topically at bedtime. Apply over nail and surrounding skin. Apply daily over previous coat. After seven (7) days, may remove with alcohol and continue cycle.   Most of epigastric pain has resolved. She has been off PPI for almost 2 weeks. Will confirm no h.pylori after treatment. If GERD/epigastric pain persist suggest CT  of abdomen and/or referral to GI and going back on PPI. Avoid NSAID use. Use tylenol instead.   Referral placed for podiatry. Start penlac for toenails.   Vaccines given today.  Follow up in 2 months for last shingrix.

## 2018-07-23 ENCOUNTER — Encounter: Payer: Self-pay | Admitting: Physician Assistant

## 2018-07-23 DIAGNOSIS — M2042 Other hammer toe(s) (acquired), left foot: Secondary | ICD-10-CM | POA: Insufficient documentation

## 2018-07-23 DIAGNOSIS — B351 Tinea unguium: Secondary | ICD-10-CM | POA: Insufficient documentation

## 2018-07-23 DIAGNOSIS — R1013 Epigastric pain: Secondary | ICD-10-CM | POA: Insufficient documentation

## 2018-07-23 LAB — H. PYLORI BREATH TEST: H. pylori Breath Test: NOT DETECTED

## 2018-07-23 NOTE — Progress Notes (Signed)
Breath test negative for h.pylori.

## 2018-07-29 ENCOUNTER — Encounter: Payer: Self-pay | Admitting: Physician Assistant

## 2018-07-30 NOTE — Telephone Encounter (Signed)
Printed for patient, she will come pick up tomorrow

## 2018-08-01 ENCOUNTER — Ambulatory Visit (INDEPENDENT_AMBULATORY_CARE_PROVIDER_SITE_OTHER): Payer: No Typology Code available for payment source

## 2018-08-01 ENCOUNTER — Other Ambulatory Visit: Payer: Self-pay

## 2018-08-01 ENCOUNTER — Ambulatory Visit: Payer: No Typology Code available for payment source | Admitting: Podiatry

## 2018-08-01 VITALS — Temp 97.8°F

## 2018-08-01 DIAGNOSIS — M2042 Other hammer toe(s) (acquired), left foot: Secondary | ICD-10-CM

## 2018-08-01 DIAGNOSIS — M21962 Unspecified acquired deformity of left lower leg: Secondary | ICD-10-CM | POA: Diagnosis not present

## 2018-08-01 DIAGNOSIS — M7752 Other enthesopathy of left foot: Secondary | ICD-10-CM

## 2018-08-01 DIAGNOSIS — M205X2 Other deformities of toe(s) (acquired), left foot: Secondary | ICD-10-CM | POA: Diagnosis not present

## 2018-08-01 NOTE — Patient Instructions (Signed)
Pre-Operative Instructions  Congratulations, you have decided to take an important step towards improving your quality of life.  You can be assured that the doctors and staff at [facility] will be with you every step of the way.  Here are some important things you should know:  1. Plan to be at the surgery center/hospital at least 1 (one) hour prior to your scheduled time, unless otherwise directed by the surgical center/hospital staff.  You must have a responsible adult accompany you, remain during the surgery and drive you home.  Make sure you have directions to the surgical center/hospital to ensure you arrive on time. 2. If you are having surgery at [facility] or [facility] hospitals, you will need a copy of your medical history and physical form from your family physician within one month prior to the date of surgery. We will give you a form for your primary physician to complete.  3. We make every effort to accommodate the date you request for surgery.  However, there are times where surgery dates or times have to be moved.  We will contact you as soon as possible if a change in schedule is required.   4. No aspirin/ibuprofen for one week before surgery.  If you are on aspirin, any non-steroidal anti-inflammatory medications (Mobic, Aleve, Ibuprofen) should not be taken seven (7) days prior to your surgery.  You make take Tylenol for pain prior to surgery.  5. Medications - If you are taking daily heart and blood pressure medications, seizure, reflux, allergy, asthma, anxiety, pain or diabetes medications, make sure you notify the surgery center/hospital before the day of surgery so they can tell you which medications you should take or avoid the day of surgery. 6. No food or drink after midnight the night before surgery unless directed otherwise by surgical center/hospital staff. 7. No alcoholic beverages 24-hours prior to surgery.  No smoking 24-hours prior or 24-hours after  surgery. 8. Wear loose pants or shorts. They should be loose enough to fit over bandages, boots, and casts. 9. Don't wear slip-on shoes. Sneakers are preferred. 10. Bring your boot with you to the surgery center/hospital.  Also bring crutches or a walker if your physician has prescribed it for you.  If you do not have this equipment, it will be provided for you after surgery. 11. If you have not been contacted by the surgery center/hospital by the day before your surgery, call to confirm the date and time of your surgery. 12. Leave-time from work may vary depending on the type of surgery you have.  Appropriate arrangements should be made prior to surgery with your employer. 13. Prescriptions will be provided immediately following surgery by your doctor.  Fill these as soon as possible after surgery and take the medication as directed. Pain medications will not be refilled on weekends and must be approved by the doctor. 14. Remove nail polish on the operative foot and avoid getting pedicures prior to surgery. 15. Wash the night before surgery.  The night before surgery wash the foot and leg well with water and the antibacterial soap provided. Be sure to pay special attention to beneath the toenails and in between the toes.  Wash for at least three (3) minutes. Rinse thoroughly with water and dry well with a towel.  Perform this wash unless told not to do so by your physician.  Enclosed: 1 Ice pack (please put in freezer the night before surgery)   1 Hibiclens skin cleaner     Pre-op instructions  If you have any questions regarding the instructions, please do not hesitate to call our office.  [place]: [address], [place], [place] [address] -- [phone]  [place]: [address] [place], [place] [address] -- [phone]  [place]: [address]  [place], [place] [address] -- [phone]  [place]: [address], [address], [place],  [address] -- [phone]  Website:  [email or web address] 

## 2018-08-01 NOTE — Progress Notes (Signed)
Subjective:  Patient ID: Amanda [name], female    DOB: 29-Dec-1955,  MRN: [id number]  Chief Complaint  Patient presents with  . Hammer Toe    Pt states left foot hammertoe 2,3,4, pain mostly in 2nd digit, rubs on shoes. Pt also states plantar forefoot pain underneath digits 2,3,4.  . Nail Problem    Bilateral 1st nails discolored and detached from nail bed. Pt states 3 yr duration, occurred after using a product called "baby soft feet"    63 y.o. female presents with the above complaint. Hx as above.  Review of Systems: Negative except as noted in the HPI. Denies N/V/F/Ch.  Past Medical History:  Diagnosis Date  . Allergy    seasonal  . Arthritis    all over  . CPAP (continuous positive airway pressure) dependence    8 cm water w/ heated humidifier  . Diabetes mellitus without complication ([place])    Pre surgery - Gastric sx 2014 Roun-Y no diabetes since  . Fatty liver 4-08   by [place]  . Gout   . Hip pain   . Hypertension   . Knee pain   . Obesity   . Recurrent UTI     Current Outpatient Medications:  .  allopurinol (ZYLOPRIM) 100 MG tablet, Take 1 tablet (100 mg total) by mouth daily., Disp: 90 tablet, Rfl: 4 .  Biotin 10000 MCG TABS, Take by mouth daily., Disp: , Rfl:  .  calcium citrate (CALCITRATE - DOSED IN MG ELEMENTAL CALCIUM) 950 MG tablet, Take 200 mg of elemental calcium by mouth daily., Disp: , Rfl:  .  cholecalciferol (VITAMIN D) 1000 units tablet, Take 2,000 Units by mouth daily., Disp: , Rfl:  .  ciclopirox (PENLAC) 8 % solution, Apply topically at bedtime. Apply over nail and surrounding skin. Apply daily over previous coat. After seven (7) days, may remove with alcohol and continue cycle., Disp: 6.6 mL, Rfl: 0 .  ibuprofen (ADVIL,MOTRIN) 200 MG tablet, Take 200 mg by mouth as needed., Disp: , Rfl:  .  losartan (COZAAR) 50 MG tablet, Take 1 tablet (50 mg total) by mouth 2 (two) times daily., Disp: 180 tablet, Rfl: 0 .  Multiple Vitamins-Minerals (CENTRUM  SILVER ULTRA WOMENS) TABS, Take 1 tablet by mouth 2 (two) times daily. , Disp: , Rfl:  .  omeprazole (PRILOSEC) 40 MG capsule, Take 1 capsule (40 mg total) by mouth daily. For 14 days., Disp: 28 capsule, Rfl: 0 .  Polysaccharide Iron Complex (POLY-IRON 150 PO), Take by mouth daily at 6 (six) AM., Disp: , Rfl:  .  spironolactone (ALDACTONE) 25 MG tablet, Take 25 mg by mouth daily., Disp: , Rfl:   Social History   Tobacco Use  Smoking Status Former Smoker  . Types: Cigarettes  . Quit date: 2001  . Years since quitting: 19.5  Smokeless Tobacco Never Used    Allergies  Allergen Reactions  . Fenofibrate Other (See Comments)    Constipation  . Morphine     REACTION: vomiting  . Penicillins     Unsure of reaction  . Sulfonamide Derivatives     Unsure of reaction   Objective:   Vitals:   08/01/18 1416  Temp: 97.8 F (36.6 C)   There is no height or weight on file to calculate BMI. Constitutional Well developed. Well nourished.  Vascular Dorsalis pedis pulses palpable bilaterally. Posterior tibial pulses palpable bilaterally. Capillary refill normal to all digits.  No cyanosis or clubbing noted. Pedal hair growth normal.  Neurologic  Normal speech. Oriented to person, place, and time. Epicritic sensation to light touch grossly present bilaterally.  Dermatologic Nails well groomed and normal in appearance. No open wounds. No skin lesions.  Orthopedic: Hammertoe formation 2nd/3rd toes, POP 2nd/3rd PIPJ, 2nd/3rd MPJ, prominent extensor tendon. Hallux interphalangeus deformity.   Radiographs: Taken and reviewed severe digital contracutures, halux interphalangeus, elongated 2nd/3rd metatarsals. No acute fractures. Assessment:   1. Hammertoe of left foot   2. Contracture of toe, left   3. Deformity of metatarsal bone of left foot   4. Capsulitis of metatarsophalangeal (MTP) joint of left foot    Plan:  Patient was evaluated and treated and all questions answered.   Hammertoes Left Foot, Extensor Tendon Contracture, Metatarsal Deformity -XR reviewed as above -Discussed shoe gear changes, padding. Crest pads dispensed. Discused surgical intervention should these fail. Discussed surgical plan as below.  -Surgical consent forms reviewed with patient. All risks, benefits, and alternatives discussed with patient. No guarantees given. Consent reviewed and signed by patient. -Planned procedures: Left 2nd/3rd toes correction of hammertoes, 2nd and 3rd metatarsal shortening osteotomies, possible tenotomy and capsulotomy/ capsulotendon balancing, possible great toe interphalangeal fusion.  No follow-ups on file.

## 2018-08-09 ENCOUNTER — Other Ambulatory Visit: Payer: Self-pay | Admitting: Physician Assistant

## 2018-08-09 MED FILL — LOSARTAN POTASSIUM 50 MG TA: 50 | 30 days supply | Qty: 60 | Fill #2

## 2018-08-12 MED FILL — OMEPRAZOLE 40 MG CPDR: 40 | 14 days supply | Qty: 14 | Fill #0

## 2018-09-24 ENCOUNTER — Ambulatory Visit: Payer: No Typology Code available for payment source

## 2018-10-02 ENCOUNTER — Other Ambulatory Visit: Payer: Self-pay

## 2018-10-02 ENCOUNTER — Ambulatory Visit (INDEPENDENT_AMBULATORY_CARE_PROVIDER_SITE_OTHER): Payer: No Typology Code available for payment source | Admitting: Physician Assistant

## 2018-10-02 VITALS — BP 117/77 | HR 63 | Temp 98.2°F | Ht 68.0 in | Wt 207.0 lb

## 2018-10-02 DIAGNOSIS — Z23 Encounter for immunization: Secondary | ICD-10-CM

## 2018-10-02 NOTE — Progress Notes (Signed)
Acute Office Visit  Subjective:    Patient ID: Amanda [name], female    DOB: 11-18-1955, 63 y.o.   MRN: [id number]  Chief Complaint  Patient presents with  . Immunizations    HPI Patient is in today for] shingles vaccine.   Past Medical History:  Diagnosis Date  . Allergy    seasonal  . Arthritis    all over  . CPAP (continuous positive airway pressure) dependence    8 cm water w/ heated humidifier  . Diabetes mellitus without complication ([place])    Pre surgery - Gastric sx 2014 Roun-Y no diabetes since  . Fatty liver 4-08   by [place]  . Gout   . Hip pain   . Hypertension   . Knee pain   . Obesity   . Recurrent UTI     Past Surgical History:  Procedure Laterality Date  . APPENDECTOMY  1977  . Kite   one  . CHOLECYSTECTOMY  1977  . [place]  . reduction mammoplasty  1985  . ROUX-EN-Y GASTRIC BYPASS  2014    Family History  Problem Relation Age of Onset  . Diabetes Mother   . Hypertension Mother   . Cancer Mother        breast- partial mastectomy- in her 42's  . Heart attack Father   . Hypertension Father   . Arthritis Father   . Gout Father   . Parkinsonism Father   . Alcohol abuse Maternal Grandfather   . Colon cancer Neg Hx   . Esophageal cancer Neg Hx   . Stomach cancer Neg Hx   . Rectal cancer Neg Hx     Social History   Socioeconomic History  . Marital status: Married    Spouse name: Not on file  . Number of children: Not on file  . Years of education: Not on file  . Highest education level: Not on file  Occupational History  . Not on file  Social Needs  . Financial resource strain: Not on file  . Food insecurity    Worry: Not on file    Inability: Not on file  . Transportation needs    Medical: Not on file    Non-medical: Not on file  Tobacco Use  . Smoking status: Former Smoker    Types: Cigarettes    Quit date: 2001    Years since quitting: 19.6  . Smokeless tobacco:  Never Used  Substance and Sexual Activity  . Alcohol use: Yes    Comment: occasional  . Drug use: No  . Sexual activity: Not on file  Lifestyle  . Physical activity    Days per week: Not on file    Minutes per session: Not on file  . Stress: Not on file  Relationships  . Social Herbalist on phone: Not on file    Gets together: Not on file    Attends religious service: Not on file    Active member of club or organization: Not on file    Attends meetings of clubs or organizations: Not on file    Relationship status: Not on file  . Intimate partner violence    Fear of current or ex partner: Not on file    Emotionally abused: Not on file    Physically abused: Not on file    Forced sexual activity: Not on file  Other Topics Concern  . Not  on file  Social History Narrative  . Not on file    Outpatient Medications Prior to Visit  Medication Sig Dispense Refill  . allopurinol (ZYLOPRIM) 100 MG tablet Take 1 tablet (100 mg total) by mouth daily. 90 tablet 4  . Biotin 10000 MCG TABS Take by mouth daily.    . calcium citrate (CALCITRATE - DOSED IN MG ELEMENTAL CALCIUM) 950 MG tablet Take 200 mg of elemental calcium by mouth daily.    . cholecalciferol (VITAMIN D) 1000 units tablet Take 2,000 Units by mouth daily.    . ciclopirox (PENLAC) 8 % solution Apply topically at bedtime. Apply over nail and surrounding skin. Apply daily over previous coat. After seven (7) days, may remove with alcohol and continue cycle. 6.6 mL 0  . ibuprofen (ADVIL,MOTRIN) 200 MG tablet Take 200 mg by mouth as needed.    Marland Kitchen losartan (COZAAR) 50 MG tablet Take 1 tablet (50 mg total) by mouth 2 (two) times daily. 180 tablet 0  . Multiple Vitamins-Minerals (CENTRUM SILVER ULTRA WOMENS) TABS Take 1 tablet by mouth 2 (two) times daily.     Marland Kitchen omeprazole (PRILOSEC) 40 MG capsule TAKE 1 CAPSULE BY MOUTH DAILY FOR 14 DAYS. 28 capsule 1  . Polysaccharide Iron Complex (POLY-IRON 150 PO) Take by mouth daily at 6  (six) AM.    . spironolactone (ALDACTONE) 25 MG tablet Take 25 mg by mouth daily.     No facility-administered medications prior to visit.     Allergies  Allergen Reactions  . Fenofibrate Other (See Comments)    Constipation  . Morphine     REACTION: vomiting  . Penicillins     Unsure of reaction  . Sulfonamide Derivatives     Unsure of reaction    ROS     Objective:    Physical Exam  BP 117/77   Pulse 63   Temp 98.2 F (36.8 C) (Oral)   Ht 5\' 8"  (1.727 m)   Wt 207 lb (93.9 kg)   BMI 31.47 kg/m  Wt Readings from Last 3 Encounters:  10/02/18 207 lb (93.9 kg)  07/22/18 207 lb (93.9 kg)  06/07/18 202 lb (91.6 kg)    Health Maintenance Due  Topic Date Due  . PAP SMEAR-Modifier  06/29/2015  . INFLUENZA VACCINE  09/07/2018    There are no preventive care reminders to display for this patient.   Lab Results  Component Value Date   TSH 1.19 06/07/2018   Lab Results  Component Value Date   WBC 13.4 (H) 06/07/2018   HGB 14.3 06/07/2018   HCT 42.1 06/07/2018   MCV 92.1 06/07/2018   PLT 245 06/07/2018   Lab Results  Component Value Date   NA 140 06/07/2018   K 5.1 06/07/2018   CO2 28 06/07/2018   GLUCOSE 98 06/07/2018   BUN 21 06/07/2018   CREATININE 0.97 06/07/2018   BILITOT 0.5 06/07/2018   ALKPHOS 52 10/11/2010   AST 16 06/07/2018   ALT 16 06/07/2018   PROT 7.2 06/07/2018   ALBUMIN 4.4 10/11/2010   CALCIUM 10.7 (H) 06/07/2018   Lab Results  Component Value Date   CHOL 159 05/23/2010   Lab Results  Component Value Date   HDL 39 (L) 05/23/2010   Lab Results  Component Value Date   LDLCALC 42 05/23/2010   Lab Results  Component Value Date   TRIG 226 (H) 10/11/2010   Lab Results  Component Value Date   CHOLHDL 4.1 05/23/2010   Lab  Results  Component Value Date   HGBA1C 6.3 12/27/2010       Assessment & Plan:   Problem List Items Addressed This Visit    None    Visit Diagnoses    Need for shingles vaccine    -  Primary      Patient tolerated injection without any complications.  No orders of the defined types were placed in this encounter.    Bo Mcclintock, [place]

## 2018-10-15 ENCOUNTER — Other Ambulatory Visit: Payer: Self-pay | Admitting: Physician Assistant

## 2018-10-15 MED FILL — LOSARTAN POTASSIUM 50 MG TA: 50 | 30 days supply | Qty: 60 | Fill #0

## 2018-10-15 MED FILL — SPIRONOLACTONE 25 MG TABS: 25 | 90 days supply | Qty: 90 | Fill #0

## 2018-12-18 MED FILL — ALLOPURINOL 100 MG TABS: 100 | 90 days supply | Qty: 90 | Fill #2

## 2018-12-18 MED FILL — LOSARTAN POTASSIUM 50 MG TA: 50 | 30 days supply | Qty: 60 | Fill #1

## 2019-01-15 MED FILL — SPIRONOLACTONE 25 MG TABS: 25 | 90 days supply | Qty: 90 | Fill #1

## 2019-01-22 ENCOUNTER — Ambulatory Visit (INDEPENDENT_AMBULATORY_CARE_PROVIDER_SITE_OTHER): Payer: No Typology Code available for payment source | Admitting: Physician Assistant

## 2019-01-22 VITALS — BP 120/78 | Temp 98.6°F | Ht 68.0 in | Wt 207.0 lb

## 2019-01-22 DIAGNOSIS — Z79891 Long term (current) use of opiate analgesic: Secondary | ICD-10-CM

## 2019-01-22 DIAGNOSIS — G8929 Other chronic pain: Secondary | ICD-10-CM

## 2019-01-22 DIAGNOSIS — M170 Bilateral primary osteoarthritis of knee: Secondary | ICD-10-CM | POA: Diagnosis not present

## 2019-01-22 MED ORDER — TRAMADOL HCL 50 MG PO TABS: ORAL_TABLET | ORAL | 0 refills | Status: DC

## 2019-01-22 MED FILL — traMADol HCL 50 MG TABS: 50 | 5 days supply | Qty: 40 | Fill #0

## 2019-01-22 NOTE — Progress Notes (Deleted)
3-4 months has gotten worse: Low back pain - just right of midline, doesn't travel down legs Bilateral knee pain Broke knee cap (left) 2 years ago Left over tramadol from injury only thing that helps pain Asking for refill of medication  Has appt to see Dr. Wynelle Link for injections in knees 02/20/2019

## 2019-01-22 NOTE — Progress Notes (Signed)
Patient ID: Amanda [name], female   DOB: 03/24/1955, 63 y.o.   MRN: [id number] .Marland KitchenVirtual Visit via Telephone Note  I connected with Amanda [name] on 01/22/19 at  8:50 AM EST by telephone and verified that I am speaking with the correct person using two identifiers.  Location: Patient: home Provider: home   I discussed the limitations, risks, security and privacy concerns of performing an evaluation and management service by telephone and the availability of in person appointments. I also discussed with the patient that there may be a patient responsible charge related to this service. The patient expressed understanding and agreed to proceed.   History of Present Illness: Patient is a 63 year old female with bilateral osteoarthritis of both knees and chronic knee pain who comes into the clinic for pain medication.  She is currently under the care of an orthopedic has been given her intermittent steroid injections.  Insurance would not pay for the viscous injections. She knows she needs knee replacement.  She is taking ibuprofen regularly for her knees which does help.  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  She would like to discuss intermittent FMLA so that she can miss days if she absolutely had to.  She would also like a refill of tramadol.  Is the only thing that helps her knees when they get real bad.  She only uses this sparingly.  She does report that she will be on continous FMLA to help take care of her daughters twins in a few weeks. She will not need intermittent until that runs out.   .. Active Ambulatory Problems    Diagnosis Date Noted  . HYPERCHOLESTEROLEMIA 09/05/2006  . FACTOR V DEFICIENCY 09/05/2006  . Essential hypertension 05/16/2007  . DIVERTICULOSIS, COLON 10/18/2006  . CONSTIPATION, MILD 06/21/2009  . FATTY LIVER DISEASE 09/12/2007  . ARTHRITIS 05/16/2007  . LEG CRAMPS 07/17/2009  . Obesity 05/23/2010  . Status post laparoscopic sleeve  gastrectomy 03/04/2018  . Bilateral primary osteoarthritis of knee 03/05/2018  . Gout 03/05/2018  . Chronic pain of both knees 03/05/2018  . Hammer toe of left foot 07/23/2018  . Epigastric pain 07/23/2018  . Onychomycosis 07/23/2018   Resolved Ambulatory Problems    Diagnosis Date Noted  . Acute cystitis 07/17/2009  . SLEEP APNEA 10/25/2007   Past Medical History:  Diagnosis Date  . Allergy   . Arthritis   . CPAP (continuous positive airway pressure) dependence   . Diabetes mellitus without complication ([place])   . Fatty liver 4-08  . Hip pain   . Hypertension   . Knee pain   . Recurrent UTI    Reviewed med, allergy, problem list.     Observations/Objective: No acute distress.  .. Today's Vitals   01/22/19 0838  BP: 120/78  Temp: 98.6 F (37 C)  TempSrc: Oral  Weight: 207 lb (93.9 kg)  Height: 5\' 8"  (1.727 m)   Body mass index is 31.47 kg/m.    Assessment and Plan: .Marland KitchenLovene was seen today for knee pain.  Diagnoses and all orders for this visit:  Chronic pain of both knees -     traMADol (ULTRAM) 50 MG tablet; Take 1-2 tablet every 6 hours for moderate to severe bilateral knee pain.  Bilateral primary osteoarthritis of knee   Will get continuous FMLA while she is helping her daughter with the new babies. If she needs intermittent leave for worsening OA and pain of both knees she can follow up  before continuous leave ends.  Marland KitchenMarland KitchenPDMP reviewed during this encounter. No concerns.  Refilled tramadol.  Discussed dependency with overuse. Follow up in 3 months or as needed.  Continue to keep follow up with orthopedics.     Follow Up Instructions:    I discussed the assessment and treatment plan with the patient. The patient was provided an opportunity to ask questions and all were answered. The patient agreed with the plan and demonstrated an understanding of the instructions.   The patient was advised to call back or seek an in-person evaluation if the  symptoms worsen or if the condition fails to improve as anticipated.  I provided 64minutes of non-face-to-face time during this encounter for current care and reviewing chart and treatment plan.    Iran Planas, PA-C

## 2019-01-27 ENCOUNTER — Encounter: Payer: Self-pay | Admitting: Physician Assistant

## 2019-02-13 MED FILL — LOSARTAN POTASSIUM 50 MG TA: 50 | 30 days supply | Qty: 60 | Fill #2

## 2019-03-27 ENCOUNTER — Other Ambulatory Visit: Payer: Self-pay | Admitting: Physician Assistant

## 2019-03-27 DIAGNOSIS — M1A4720 Other secondary chronic gout, left ankle and foot, without tophus (tophi): Secondary | ICD-10-CM

## 2019-03-27 MED FILL — ALLOPURINOL 100 MG TABS: 100 | 90 days supply | Qty: 90 | Fill #0

## 2019-04-19 MED FILL — SPIRONOLACTONE 25 MG TABS: 25 | 90 days supply | Qty: 90 | Fill #0

## 2019-04-22 ENCOUNTER — Encounter: Payer: Self-pay | Admitting: Physician Assistant

## 2019-04-22 ENCOUNTER — Telehealth (INDEPENDENT_AMBULATORY_CARE_PROVIDER_SITE_OTHER): Payer: No Typology Code available for payment source | Admitting: Physician Assistant

## 2019-04-22 VITALS — BP 120/77 | Temp 97.5°F | Ht 68.0 in | Wt 207.0 lb

## 2019-04-22 DIAGNOSIS — I10 Essential (primary) hypertension: Secondary | ICD-10-CM | POA: Diagnosis not present

## 2019-04-22 DIAGNOSIS — M1A4720 Other secondary chronic gout, left ankle and foot, without tophus (tophi): Secondary | ICD-10-CM

## 2019-04-22 DIAGNOSIS — M170 Bilateral primary osteoarthritis of knee: Secondary | ICD-10-CM | POA: Diagnosis not present

## 2019-04-22 DIAGNOSIS — M25562 Pain in left knee: Secondary | ICD-10-CM

## 2019-04-22 DIAGNOSIS — M25561 Pain in right knee: Secondary | ICD-10-CM | POA: Diagnosis not present

## 2019-04-22 DIAGNOSIS — Z13220 Encounter for screening for lipoid disorders: Secondary | ICD-10-CM

## 2019-04-22 DIAGNOSIS — R7303 Prediabetes: Secondary | ICD-10-CM

## 2019-04-22 DIAGNOSIS — G8929 Other chronic pain: Secondary | ICD-10-CM

## 2019-04-22 MED ORDER — SPIRONOLACTONE 25 MG PO TABS: 25.0000 mg | ORAL_TABLET | Freq: Every day | ORAL | 1 refills | Status: DC

## 2019-04-22 MED ORDER — ALLOPURINOL 100 MG PO TABS: 100.0000 mg | ORAL_TABLET | Freq: Every day | ORAL | 1 refills | Status: DC

## 2019-04-22 MED ORDER — LOSARTAN POTASSIUM 50 MG PO TABS: 50.0000 mg | ORAL_TABLET | Freq: Two times a day (BID) | ORAL | 1 refills | Status: DC

## 2019-04-22 MED ORDER — TRAMADOL HCL 50 MG PO TABS: ORAL_TABLET | ORAL | 0 refills | Status: DC

## 2019-04-22 MED FILL — traMADol HCL 50 MG TABS: 50 | 5 days supply | Qty: 40 | Fill #0

## 2019-04-22 MED FILL — LOSARTAN POTASSIUM 50 MG TA: 50 | 90 days supply | Qty: 180 | Fill #0

## 2019-04-22 NOTE — Progress Notes (Signed)
Patient ID: Amanda [name], female   DOB: 1955/10/18, 64 y.o.   MRN: [id number] .Marland KitchenVirtual Visit via Video Note  I connected with Amanda [name] on 04/22/19 at  8:50 AM EDT by a video enabled telemedicine application and verified that I am speaking with the correct person using two identifiers.  Location: Patient: home Provider: clinic   I discussed the limitations of evaluation and management by telemedicine and the availability of in person appointments. The patient expressed understanding and agreed to proceed.  History of Present Illness: Pt is a 64 yo female with bilateral OA of both knees, gout, HTN who calls in for medication refill.   She is doing pretty good. She denies any CP, palpitations, headaches or vision changes. She is taking her BP medication daily.   She has not had any gout attacks on allopurinol.   She takes tramadol as needed for primary OA pain. She has orthopedist. She hopes to have surgery in June on one knee.   .. Active Ambulatory Problems    Diagnosis Date Noted  . HYPERCHOLESTEROLEMIA 09/05/2006  . FACTOR V DEFICIENCY 09/05/2006  . Essential hypertension 05/16/2007  . DIVERTICULOSIS, COLON 10/18/2006  . CONSTIPATION, MILD 06/21/2009  . FATTY LIVER DISEASE 09/12/2007  . ARTHRITIS 05/16/2007  . LEG CRAMPS 07/17/2009  . Obesity 05/23/2010  . Status post laparoscopic sleeve gastrectomy 03/04/2018  . Bilateral primary osteoarthritis of knee 03/05/2018  . Gout 03/05/2018  . Chronic pain of both knees 03/05/2018  . Hammer toe of left foot 07/23/2018  . Epigastric pain 07/23/2018  . Onychomycosis 07/23/2018   Resolved Ambulatory Problems    Diagnosis Date Noted  . Acute cystitis 07/17/2009  . SLEEP APNEA 10/25/2007   Past Medical History:  Diagnosis Date  . Allergy   . Arthritis   . CPAP (continuous positive airway pressure) dependence   . Diabetes mellitus without complication ([place])   . Fatty liver 4-08  . Hip pain   . Hypertension   .  Knee pain   . Recurrent UTI    Reviewed med, allergies and problem list.     Observations/Objective: No acute distress.  Normal mood and appearance.   .. Today's Vitals   04/22/19 0839  BP: 120/77  Temp: (!) 97.5 F (36.4 C)  TempSrc: Oral  Weight: 207 lb (93.9 kg)  Height: 5\' 8"  (1.727 m)   Body mass index is 31.47 kg/m.     Assessment and Plan: Marland KitchenMarland KitchenDiagnoses and all orders for this visit:  Essential hypertension -     losartan (COZAAR) 50 MG tablet; Take 1 tablet (50 mg total) by mouth 2 (two) times daily. -     spironolactone (ALDACTONE) 25 MG tablet; Take 1 tablet (25 mg total) by mouth daily. -     COMPLETE METABOLIC PANEL WITH GFR -     CBC  Chronic pain of both knees -     traMADol (ULTRAM) 50 MG tablet; Take 1-2 tablet every 6 hours for moderate to severe bilateral knee pain. 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  Bilateral primary osteoarthritis of knee  Screening for lipid disorders -     Lipid Panel w/reflex Direct LDL  Pre-diabetes -     Hemoglobin A1c   Need for labs. Ordered today.  Refilled medications.  BP to goal.  .Marland Kitchen  PDMP reviewed during this encounter. No concerns.  Tramadol refilled.   Follow up in 6 months.    Follow Up Instructions:    I discussed the assessment and treatment plan with the patient. The patient was provided an opportunity to ask questions and all were answered. The patient agreed with the plan and demonstrated an understanding of the instructions.   The patient was advised to call back or seek an in-person evaluation if the symptoms worsen or if the condition fails to improve as anticipated.  I provided 20 minutes of non-face-to-face time during this encounter.   Iran Planas, PA-C

## 2019-04-22 NOTE — Progress Notes (Signed)
Seeing Amanda [name] about knee and may have possible surgery in June  Needs refills of pended medications

## 2019-05-14 ENCOUNTER — Encounter: Payer: Self-pay | Admitting: Physician Assistant

## 2019-05-26 ENCOUNTER — Telehealth: Payer: Self-pay | Admitting: Neurology

## 2019-05-26 NOTE — Telephone Encounter (Signed)
Left message on machine for patient to call back about surgical clearance. Needs appt. Awaiting call back to discuss if wants to wait for Elainah Rhyne to get back in the office.

## 2019-05-26 NOTE — Telephone Encounter (Signed)
Didn't want to wait for Amanda [name] to come back. Her surgery is scheduled in June. Appt made for next week.

## 2019-06-04 ENCOUNTER — Ambulatory Visit: Payer: No Typology Code available for payment source | Admitting: Medical-Surgical

## 2019-06-06 ENCOUNTER — Encounter: Payer: Self-pay | Admitting: Medical-Surgical

## 2019-06-06 ENCOUNTER — Other Ambulatory Visit: Payer: Self-pay

## 2019-06-06 ENCOUNTER — Ambulatory Visit (INDEPENDENT_AMBULATORY_CARE_PROVIDER_SITE_OTHER): Payer: No Typology Code available for payment source | Admitting: Medical-Surgical

## 2019-06-06 VITALS — BP 126/78 | HR 69 | Temp 97.8°F | Ht 68.0 in | Wt 212.0 lb

## 2019-06-06 DIAGNOSIS — I10 Essential (primary) hypertension: Secondary | ICD-10-CM | POA: Diagnosis not present

## 2019-06-06 DIAGNOSIS — R1013 Epigastric pain: Secondary | ICD-10-CM

## 2019-06-06 DIAGNOSIS — M1A4720 Other secondary chronic gout, left ankle and foot, without tophus (tophi): Secondary | ICD-10-CM

## 2019-06-06 DIAGNOSIS — D682 Hereditary deficiency of other clotting factors: Secondary | ICD-10-CM | POA: Diagnosis not present

## 2019-06-06 DIAGNOSIS — M170 Bilateral primary osteoarthritis of knee: Secondary | ICD-10-CM

## 2019-06-06 DIAGNOSIS — Z01818 Encounter for other preprocedural examination: Secondary | ICD-10-CM | POA: Diagnosis not present

## 2019-06-06 DIAGNOSIS — K5900 Constipation, unspecified: Secondary | ICD-10-CM

## 2019-06-06 NOTE — Progress Notes (Signed)
HPI: Amanda [name] is a 64 y.o. female who  has a past medical history of Allergy, Arthritis, CPAP (continuous positive airway pressure) dependence, Diabetes mellitus without complication ([place]), Fatty liver (4-08), Gout, Hip pain, Hypertension, Knee pain, Obesity, and Recurrent UTI.  she presents to [facility] today, 06/06/19,  for chief complaint of: Preoperative clearance  Essential hypertension-taking losartan 50 mg twice daily and spironolactone 25 mg daily.  Blood pressure well controlled at 126/78 today.  Denies chest pain, shortness of breath, lower extremity edema, headaches, dizziness, and palpitations.  Denies exercise intolerance or chest pain with cardiovascular exercise.  Factor V deficiency-reports family history of this and prior testing as a child where she was positive.  She reports she has never had any symptoms and does not have any problem with prolonged bleeding.  Epigastric pain-has had some intermittent epigastric/umbilical pain recently.  History of gastric sleeve surgery.  After surgery was told to avoid coffee but has increased her and take of this lately.  She feels that the coffee is a trigger for the discomfort and has since stopped drinking it.  Noted improvement in epigastric pain since stopping coffee.  Also has started taking omeprazole daily to help with symptoms.  Gout-taking allopurinol 100 mg daily.  No gout flares for approximately 6 or 7 years.  OA of bilateral knees-bilateral OA of knees hindering ability to do physical exercise due to pain.  Planning to have right total knee replacement in June.  Constipation-chronic constipation for years.  Reports that she has had some new onset incontinence after relieving constipation over the last several weeks.  She reports having stool incontinence 3 times while at work.  Episodes usually follow having a bowel movement after being constipated a couple of days. Past medical,  surgical, social and family history reviewed:  Patient Active Problem List   Diagnosis Date Noted  . Hammer toe of left foot 07/23/2018  . Epigastric pain 07/23/2018  . Onychomycosis 07/23/2018  . Bilateral primary osteoarthritis of knee 03/05/2018  . Gout 03/05/2018  . Chronic pain of both knees 03/05/2018  . Status post laparoscopic sleeve gastrectomy 03/04/2018  . Obesity 05/23/2010  . LEG CRAMPS 07/17/2009  . Constipation 06/21/2009  . FATTY LIVER DISEASE 09/12/2007  . Essential hypertension 05/16/2007  . ARTHRITIS 05/16/2007  . DIVERTICULOSIS, COLON 10/18/2006  . HYPERCHOLESTEROLEMIA 09/05/2006  . Factor V deficiency ([place]) 09/05/2006    Past Surgical History:  Procedure Laterality Date  . APPENDECTOMY  1977  . [place]   one  . CHOLECYSTECTOMY  1977  . [place]  . reduction mammoplasty  1985  . ROUX-EN-Y GASTRIC BYPASS  2014    Social History   Tobacco Use  . Smoking status: Former Smoker    Types: Cigarettes    Quit date: 2001    Years since quitting: 20.3  . Smokeless tobacco: Never Used  Substance Use Topics  . Alcohol use: Yes    Comment: occasional    Family History  Problem Relation Age of Onset  . Diabetes Mother   . Hypertension Mother   . Cancer Mother        breast- partial mastectomy- in her 48's  . Heart attack Father   . Hypertension Father   . Arthritis Father   . Gout Father   . Parkinsonism Father   . Alcohol abuse Maternal Grandfather   . Colon cancer Neg Hx   . Esophageal  cancer Neg Hx   . Stomach cancer Neg Hx   . Rectal cancer Neg Hx      Current medication list and allergy/intolerance information reviewed:    Current Outpatient Medications  Medication Sig Dispense Refill  . allopurinol (ZYLOPRIM) 100 MG tablet Take 1 tablet (100 mg total) by mouth daily. 90 tablet 1  . Biotin 10 MG CAPS Take by mouth.    Marland Kitchen CALCIUM PO Take 600 mg by mouth daily.     . cholecalciferol  (VITAMIN D3) 25 MCG (1000 UNIT) tablet Take 5,000 Units by mouth daily.    . ferrous sulfate 324 MG TBEC Take 324 mg by mouth.    . Fexofenadine HCl (ALLERGY 24-HR PO) Take 10 mg by mouth daily.    Marland Kitchen ibuprofen (ADVIL,MOTRIN) 200 MG tablet Take 200 mg by mouth as needed.    Marland Kitchen losartan (COZAAR) 50 MG tablet Take 1 tablet (50 mg total) by mouth 2 (two) times daily. 180 tablet 1  . Multiple Vitamins-Minerals (CENTRUM SILVER ULTRA WOMENS) TABS Take 1 tablet by mouth 2 (two) times daily.     Marland Kitchen spironolactone (ALDACTONE) 25 MG tablet Take 1 tablet (25 mg total) by mouth daily. 90 tablet 1  . traMADol (ULTRAM) 50 MG tablet Take 1-2 tablet every 6 hours for moderate to severe bilateral knee pain. 40 tablet 0   No current facility-administered medications for this visit.    Allergies  Allergen Reactions  . Fenofibrate Other (See Comments)    Constipation  . Morphine     REACTION: vomiting  . Penicillins     Unsure of reaction  . Sulfonamide Derivatives     Unsure of reaction      Review of Systems:  Constitutional:  No  fever, no chills, No recent illness, No unintentional weight changes. No significant fatigue.   HEENT: No  headache, no vision change, no hearing change, No sore throat, No  sinus pressure  Cardiac: No  chest pain, No  pressure, No palpitations, No  Orthopnea  Respiratory:  No  shortness of breath. No  Cough  Gastrointestinal: + intermittent epigastric abdominal pain, No  nausea, No  vomiting,  No  blood in stool, No  Diarrhea, + intermittent  constipation  Musculoskeletal: No new myalgia/arthralgia  Skin: No  Rash, No other wounds/concerning lesions  Genitourinary: + incontinence, No  abnormal genital bleeding, No abnormal genital discharge  Hem/Onc: + easy bruising, No  abnormal lymph node  Endocrine: No cold intolerance,  No heat intolerance. No polyuria/polydipsia/polyphagia   Neurologic: No  weakness, No  dizziness, No  slurred speech/focal weakness/facial  droop  Psychiatric: No  concerns with depression, No  concerns with anxiety, No sleep problems, No mood problems  Exam:  BP 126/78   Pulse 69   Temp 97.8 F (36.6 C) (Oral)   Ht 5\' 8"  (1.727 m)   Wt 212 lb (96.2 kg)   SpO2 96%   BMI 32.23 kg/m   Constitutional: VS see above. General Appearance: alert, well-developed, well-nourished, NAD  Eyes: Normal lids and conjunctive, non-icteric sclera  Ears, Nose, Mouth, Throat: MMM, Normal external inspection ears/nares/mouth/lips/gums. TM normal bilaterally. Pharynx/tonsils no erythema, no exudate. Nasal mucosa normal.   Neck: No masses, trachea midline. No thyroid enlargement. No tenderness/mass appreciated. No lymphadenopathy  Respiratory: Normal respiratory effort. no wheeze, no rhonchi, no rales  Cardiovascular: S1/S2 normal, no murmur, no rub/gallop auscultated. RRR. No lower extremity edema. Pedal pulse II/IV bilaterally DP and PT. No carotid bruit or JVD. No  abdominal aortic bruit.  Gastrointestinal: Nontender, no masses. No hepatomegaly, no splenomegaly. No hernia appreciated. Bowel sounds normal. Rectal exam deferred.   Musculoskeletal: Gait normal. No clubbing/cyanosis of digits.   Neurological: Normal balance/coordination. No tremor. No cranial nerve deficit on limited exam. Motor and sensation intact and symmetric. Cerebellar reflexes intact.   Skin: warm, dry, intact. No rash/ulcer. No concerning nevi or subq nodules on limited exam.    Psychiatric: Normal judgment/insight. Normal mood and affect. Oriented x3.   EKG performed in office-heart rate 64, normal sinus rhythm, left axis deviation  No results found for this or any previous visit (from the past 72 hour(s)).  No results found.   ASSESSMENT/PLAN:   1. Preoperative clearance EKG performed in office.  Obtaining CBC, CMP, albumin, and PT/INR, and hemoglobin A1c today per preoperative clearance form. - EKG 12-Lead - CBC - COMPLETE METABOLIC PANEL WITH GFR -  Albumin - INR/PT - Hemoglobin A1c  2. Essential hypertension Continue losartan 50 mg twice daily and spironolactone 25 mg daily.  3. Factor V deficiency ([place]) No symptoms or prolonged bleeding a factor V deficiency.  Do not feel this will affect tolerance of surgery based on patient's history.  Checking PT/INR today.  4. Epigastric pain Continue omeprazole.  Avoid dietary triggers including coffee as much as possible.  If no improvement in symptoms may benefit from evaluation for gastric ulcer.  5. 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  6. Bilateral primary osteoarthritis of knee Completing presurgical evaluation for total knee replacement in June.  7. Constipation, unspecified constipation type Discussed constipation prevention including starting a stool softener or using MiraLAX as needed.  Suspect fecal incontinence related to increased pressure related to constipation. Preventing constipation will likely help prevent the fecal incontinence episodes that she has been experiencing.   Orders Placed This Encounter  Procedures  . CBC  . COMPLETE METABOLIC PANEL WITH GFR  . Albumin  . INR/PT  . Hemoglobin A1c  . EKG 12-Lead    No orders of the defined types were placed in this encounter.   There are no Patient Instructions on file for this visit.  Follow-up plan: Return if symptoms worsen or fail to improve.  Clearnce Sorrel, DNP, APRN, FNP-BC [facility]

## 2019-06-07 LAB — CBC
HCT: 37.3 % (ref 35.0–45.0)
Hemoglobin: 12.4 g/dL (ref 11.7–15.5)
MCH: 32.7 pg (ref 27.0–33.0)
MCHC: 33.2 g/dL (ref 32.0–36.0)
MCV: 98.4 fL (ref 80.0–100.0)
MPV: 10.7 fL (ref 7.5–12.5)
Platelets: 179 10*3/uL (ref 140–400)
RBC: 3.79 10*6/uL — ABNORMAL LOW (ref 3.80–5.10)
RDW: 12.8 % (ref 11.0–15.0)
WBC: 10.1 10*3/uL (ref 3.8–10.8)

## 2019-06-07 LAB — COMPLETE METABOLIC PANEL WITH GFR
AG Ratio: 1.8 (calc) (ref 1.0–2.5)
ALT: 14 U/L (ref 6–29)
AST: 16 U/L (ref 10–35)
Albumin: 4.2 g/dL (ref 3.6–5.1)
Alkaline phosphatase (APISO): 78 U/L (ref 37–153)
BUN/Creatinine Ratio: 20 (calc) (ref 6–22)
BUN: 21 mg/dL (ref 7–25)
CO2: 24 mmol/L (ref 20–32)
Calcium: 9.8 mg/dL (ref 8.6–10.4)
Chloride: 108 mmol/L (ref 98–110)
Creat: 1.05 mg/dL — ABNORMAL HIGH (ref 0.50–0.99)
GFR, Est African American: 65 mL/min/{1.73_m2} (ref >=60)
GFR, Est Non African American: 56 mL/min/{1.73_m2} — ABNORMAL LOW (ref >=60)
Globulin: 2.3 g/dL (calc) (ref 1.9–3.7)
Glucose, Bld: 96 mg/dL (ref 65–99)
Potassium: 5.1 mmol/L (ref 3.5–5.3)
Sodium: 141 mmol/L (ref 135–146)
Total Bilirubin: 0.3 mg/dL (ref 0.2–1.2)
Total Protein: 6.5 g/dL (ref 6.1–8.1)

## 2019-06-07 LAB — PROTIME-INR
INR: 1
Prothrombin Time: 11 s (ref 9.0–11.5)

## 2019-06-07 LAB — HEMOGLOBIN A1C
Hgb A1c MFr Bld: 6 % of total Hgb — ABNORMAL HIGH (ref <5.7)
Mean Plasma Glucose: 126 (calc)
eAG (mmol/L): 7 (calc)

## 2019-06-19 ENCOUNTER — Other Ambulatory Visit: Payer: Self-pay

## 2019-06-19 ENCOUNTER — Other Ambulatory Visit: Payer: Self-pay | Admitting: Medical-Surgical

## 2019-06-19 DIAGNOSIS — Z01818 Encounter for other preprocedural examination: Secondary | ICD-10-CM

## 2019-06-19 DIAGNOSIS — N3000 Acute cystitis without hematuria: Secondary | ICD-10-CM

## 2019-06-19 LAB — POCT URINALYSIS DIP (CLINITEK)
Bilirubin, UA: NEGATIVE
Blood, UA: NEGATIVE
Glucose, UA: NEGATIVE mg/dL
Ketones, POC UA: NEGATIVE mg/dL
Nitrite, UA: POSITIVE — AB
POC PROTEIN,UA: NEGATIVE
Spec Grav, UA: 1.025 (ref 1.010–1.025)
Urobilinogen, UA: 0.2 E.U./dL
pH, UA: 5 (ref 5.0–8.0)

## 2019-06-19 MED ORDER — NITROFURANTOIN MONOHYD MACRO 100 MG PO CAPS: 100.0000 mg | ORAL_CAPSULE | Freq: Two times a day (BID) | ORAL | 0 refills | Status: DC

## 2019-06-20 ENCOUNTER — Other Ambulatory Visit: Payer: Self-pay

## 2019-06-20 DIAGNOSIS — N3000 Acute cystitis without hematuria: Secondary | ICD-10-CM

## 2019-06-21 LAB — URINE CULTURE
MICRO NUMBER:: 10475048
SPECIMEN QUALITY:: ADEQUATE

## 2019-06-22 ENCOUNTER — Other Ambulatory Visit: Payer: Self-pay | Admitting: Medical-Surgical

## 2019-06-22 MED ORDER — CIPROFLOXACIN HCL 500 MG PO TABS: 500.0000 mg | ORAL_TABLET | Freq: Two times a day (BID) | ORAL | 0 refills | Status: DC

## 2019-06-23 ENCOUNTER — Other Ambulatory Visit: Payer: Self-pay

## 2019-06-23 DIAGNOSIS — N3000 Acute cystitis without hematuria: Secondary | ICD-10-CM

## 2019-07-02 ENCOUNTER — Encounter (HOSPITAL_COMMUNITY): Payer: Self-pay

## 2019-07-02 ENCOUNTER — Other Ambulatory Visit: Payer: Self-pay

## 2019-07-02 DIAGNOSIS — N3000 Acute cystitis without hematuria: Secondary | ICD-10-CM

## 2019-07-02 NOTE — Patient Instructions (Addendum)
DUE TO COVID-19 ONLY ONE VISITOR IS ALLOWED TO COME WITH YOU AND STAY IN THE WAITING ROOM ONLY DURING PRE OP AND PROCEDURE DAY OF SURGERY. Two  VISITOR MAY VISIT WITH YOU AFTER SURGERY IN YOUR PRIVATE ROOM DURING VISITING HOURS ONLY!  YOU NEED TO HAVE A COVID 19 TEST ON___6-10-21____ @__8 :25_____, THIS TEST MUST BE DONE BEFORE SURGERY, COME  [place]  , [id number].  ([place]) ONCE YOUR COVID TEST IS COMPLETED, PLEASE BEGIN THE QUARANTINE INSTRUCTIONS AS OUTLINED IN YOUR HANDOUT.                Amanda [name]  07/02/2019   Your procedure is scheduled on: 07-21-19   Report to [facility] Main  Entrance   Report to admitting at       1220  PM     Call this number if you have problems the morning of surgery [phone]    Remember: NO SOLID FOOD AFTER MIDNIGHT THE NIGHT PRIOR TO SURGERY. NOTHING BY MOUTH EXCEPT CLEAR LIQUIDS UNTIL  11:50am . PLEASE FINISH G2  DRINK PER SURGEON ORDER  WHICH NEEDS TO BE COMPLETED AT    1150 am then nothing by mouth.    CLEAR LIQUID DIET until 1150 am then nothing by mouth   Foods Allowed                                                                         Foods Excluded  Coffee and tea, regular and decaf   No creamer                          liquids that you cannot  Plain Jell-O any favor except red or purple                                           see through such as: Fruit ices (not with fruit pulp)                                                    milk, soups, orange juice  Iced Popsicles                                                     All solid food Carbonated beverages, regular and diet                                    Cranberry, grape and apple juices Sports drinks like Gatorade Lightly seasoned clear broth or consume(fat free) Sugar, honey syrup   _____________________________________________________________________     BRUSH YOUR TEETH MORNING OF SURGERY AND RINSE YOUR MOUTH OUT,  NO CHEWING GUM CANDY OR MINTS.  Take these medicines the morning of surgery with A SIP OF WATER: fexofenadine, allopurinol                                 You may not have any metal on your body including hair pins and              piercings  Do not wear jewelry, make-up, lotions, powders or perfumes, deodorant             Do not wear nail polish on your fingernails.  Do not shave  48 hours prior to surgery.            Do not bring valuables to the hospital. [place].  Contacts, dentures or bridgework may not be worn into surgery.      Patients discharged the day of surgery will not be allowed to drive home. IF YOU ARE HAVING SURGERY AND GOING HOME THE SAME DAY, YOU MUST HAVE AN ADULT TO DRIVE YOU HOME AND BE WITH YOU FOR 24 HOURS. YOU MAY GO HOME BY TAXI OR UBER OR ORTHERWISE, BUT AN ADULT MUST ACCOMPANY YOU HOME AND STAY WITH YOU FOR 24 HOURS.  Name and phone number of your driver:  Special Instructions: N/A              Please read over the following fact sheets you were given: _____________________________________________________________________             [facility] - Preparing for Surgery Before surgery, you can play an important role.  Because skin is not sterile, your skin needs to be as free of germs as possible.  You can reduce the number of germs on your skin by washing with CHG (chlorahexidine gluconate) soap before surgery.  CHG is an antiseptic cleaner which kills germs and bonds with the skin to continue killing germs even after washing. Please DO NOT use if you have an allergy to CHG or antibacterial soaps.  If your skin becomes reddened/irritated stop using the CHG and inform your nurse when you arrive at Short Stay. Do not shave (including legs and underarms) for at least 48 hours prior to the first CHG shower.  You may shave your face/neck. Please follow these instructions carefully:  1.  Shower with CHG Soap  the night before surgery and the  morning of Surgery.  2.  If you choose to wash your hair, wash your hair first as usual with your  normal  shampoo.  3.  After you shampoo, rinse your hair and body thoroughly to remove the  shampoo.                           4.  Use CHG as you would any other liquid soap.  You can apply chg directly  to the skin and wash                       Gently with a scrungie or clean washcloth.  5.  Apply the CHG Soap to your body ONLY FROM THE NECK DOWN.   Do not use on face/ open  Wound or open sores. Avoid contact with eyes, ears mouth and genitals (private parts).                       Wash face,  Genitals (private parts) with your normal soap.             6.  Wash thoroughly, paying special attention to the area where your surgery  will be performed.  7.  Thoroughly rinse your body with warm water from the neck down.  8.  DO NOT shower/wash with your normal soap after using and rinsing off  the CHG Soap.                9.  Pat yourself dry with a clean towel.            10.  Wear clean pajamas.            11.  Place clean sheets on your bed the night of your first shower and do not  sleep with pets. Day of Surgery : Do not apply any lotions/deodorants the morning of surgery.  Please wear clean clothes to the hospital/surgery center.  FAILURE TO FOLLOW THESE INSTRUCTIONS MAY RESULT IN THE CANCELLATION OF YOUR SURGERY PATIENT SIGNATURE_________________________________  NURSE SIGNATURE__________________________________  ________________________________________________________________________   Amanda [name]  An incentive spirometer is a tool that can help keep your lungs clear and active. This tool measures how well you are filling your lungs with each breath. Taking long deep breaths may help reverse or decrease the chance of developing breathing (pulmonary) problems (especially infection) following:  A long period of time when  you are unable to move or be active. BEFORE THE PROCEDURE   If the spirometer includes an indicator to show your best effort, your nurse or respiratory therapist will set it to a desired goal.  If possible, sit up straight or lean slightly forward. Try not to slouch.  Hold the incentive spirometer in an upright position. INSTRUCTIONS FOR USE  1. Sit on the edge of your bed if possible, or sit up as far as you can in bed or on a chair. 2. Hold the incentive spirometer in an upright position. 3. Breathe out normally. 4. Place the mouthpiece in your mouth and seal your lips tightly around it. 5. Breathe in slowly and as deeply as possible, raising the piston or the ball toward the top of the column. 6. Hold your breath for 3-5 seconds or for as long as possible. Allow the piston or ball to fall to the bottom of the column. 7. Remove the mouthpiece from your mouth and breathe out normally. 8. Rest for a few seconds and repeat Steps 1 through 7 at least 10 times every 1-2 hours when you are awake. Take your time and take a few normal breaths between deep breaths. 9. The spirometer may include an indicator to show your best effort. Use the indicator as a goal to work toward during each repetition. 10. After each set of 10 deep breaths, practice coughing to be sure your lungs are clear. If you have an incision (the cut made at the time of surgery), support your incision when coughing by placing a pillow or rolled up towels firmly against it. Once you are able to get out of bed, walk around indoors and cough well. You may stop using the incentive spirometer when instructed by your caregiver.  RISKS AND COMPLICATIONS  Take your time so you do not get  dizzy or light-headed.  If you are in pain, you may need to take or ask for pain medication before doing incentive spirometry. It is harder to take a deep breath if you are having pain. AFTER USE  Rest and breathe slowly and easily.  It can be  helpful to keep track of a log of your progress. Your caregiver can provide you with a simple table to help with this. If you are using the spirometer at home, follow these instructions: [place] IF:   You are having difficultly using the spirometer.  You have trouble using the spirometer as often as instructed.  Your pain medication is not giving enough relief while using the spirometer.  You develop fever of 100.5 F (38.1 C) or higher. SEEK IMMEDIATE MEDICAL CARE IF:   You cough up bloody sputum that had not been present before.  You develop fever of 102 F (38.9 C) or greater.  You develop worsening pain at or near the incision site. MAKE SURE YOU:   Understand these instructions.  Will watch your condition.  Will get help right away if you are not doing well or get worse. Document Released: 06/05/2006 Document Revised: 04/17/2011 Document Reviewed: 08/06/2006 [facility] Patient Information 2014 [place], [place].   ________________________________________________________________________

## 2019-07-02 NOTE — Progress Notes (Addendum)
PCP - Samuel Bouche lov 06-06-19 epic for  Clearance  Clearance form on chart  Cardiologist -   Chest x-ray -  EKG - 06-06-19 epic Stress Test -  ECHO -  Cardiac Cath -  [id number] 06-06-19 6.0 epic Sleep Study -  CPAP - no longer needed since roux en y surgery  Fasting Blood Sugar -  Checks Blood Sugar _____ times a day  Blood Thinner Instructions: Aspirin Instructions: Last Dose:  Anesthesia review: urine culture negative, DM no meds ,htn  Patient denies shortness of breath, fever, cough and chest pain at PAT appointment   none   Patient verbalized understanding of instructions that were given to them at the PAT appointment. Patient was also instructed that they will need to review over the PAT instructions again at home before surgery.

## 2019-07-04 ENCOUNTER — Other Ambulatory Visit: Payer: Self-pay | Admitting: Medical-Surgical

## 2019-07-04 LAB — URINE CULTURE
MICRO NUMBER:: 10524015
SPECIMEN QUALITY:: ADEQUATE

## 2019-07-04 MED ORDER — NITROFURANTOIN MONOHYD MACRO 100 MG PO CAPS: 100.0000 mg | ORAL_CAPSULE | Freq: Two times a day (BID) | ORAL | 0 refills | Status: DC

## 2019-07-04 MED FILL — NITROFURANTOIN MONO-MCR 100: 100 | 7 days supply | Qty: 14 | Fill #0

## 2019-07-09 ENCOUNTER — Encounter (HOSPITAL_COMMUNITY)
Admission: RE | Admit: 2019-07-09 | Discharge: 2019-07-09 | Disposition: A | Discharge status: Home / Self Care | Payer: No Typology Code available for payment source | Source: Ambulatory Visit | Attending: Orthopedic Surgery | Admitting: Orthopedic Surgery

## 2019-07-09 ENCOUNTER — Encounter (HOSPITAL_COMMUNITY): Payer: Self-pay

## 2019-07-09 ENCOUNTER — Telehealth: Payer: Self-pay | Admitting: Physician Assistant

## 2019-07-09 ENCOUNTER — Other Ambulatory Visit: Payer: Self-pay

## 2019-07-09 DIAGNOSIS — Z01812 Encounter for preprocedural laboratory examination: Secondary | ICD-10-CM | POA: Insufficient documentation

## 2019-07-09 HISTORY — DX: Sleep apnea, unspecified: G47.30

## 2019-07-09 HISTORY — DX: Pneumonia, unspecified organism: J18.9

## 2019-07-09 HISTORY — DX: Disease of blood and blood-forming organs, unspecified: D75.9

## 2019-07-09 LAB — CBC
HCT: 39.8 % (ref 36.0–46.0)
Hemoglobin: 12.4 g/dL (ref 12.0–15.0)
MCH: 32.7 pg (ref 26.0–34.0)
MCHC: 31.2 g/dL (ref 30.0–36.0)
MCV: 105 fL — ABNORMAL HIGH (ref 80.0–100.0)
Platelets: 167 10*3/uL (ref 150–400)
RBC: 3.79 MIL/uL — ABNORMAL LOW (ref 3.87–5.11)
RDW: 13.2 % (ref 11.5–15.5)
WBC: 7.4 10*3/uL (ref 4.0–10.5)
nRBC: 0 % (ref 0.0–0.2)

## 2019-07-09 LAB — ABO/RH: ABO/RH(D): O POS

## 2019-07-09 LAB — SURGICAL PCR SCREEN
MRSA, PCR: NEGATIVE
Staphylococcus aureus: NEGATIVE

## 2019-07-09 LAB — COMPREHENSIVE METABOLIC PANEL
ALT: 19 U/L (ref 0–44)
AST: 21 U/L (ref 15–41)
Albumin: 4.3 g/dL (ref 3.5–5.0)
Alkaline Phosphatase: 72 U/L (ref 38–126)
Anion gap: 9 (ref 5–15)
BUN: 24 mg/dL — ABNORMAL HIGH (ref 8–23)
CO2: 26 mmol/L (ref 22–32)
Calcium: 9.9 mg/dL (ref 8.9–10.3)
Chloride: 109 mmol/L (ref 98–111)
Creatinine, Ser: 1.09 mg/dL — ABNORMAL HIGH (ref 0.44–1.00)
GFR calc Af Amer: >60 mL/min (ref >60)
GFR calc non Af Amer: 54 mL/min — ABNORMAL LOW (ref >60)
Glucose, Bld: 101 mg/dL — ABNORMAL HIGH (ref 70–99)
Potassium: 4.8 mmol/L (ref 3.5–5.1)
Sodium: 144 mmol/L (ref 135–145)
Total Bilirubin: 0.7 mg/dL (ref 0.3–1.2)
Total Protein: 7.1 g/dL (ref 6.5–8.1)

## 2019-07-09 LAB — PROTIME-INR
INR: 1.1 (ref 0.8–1.2)
Prothrombin Time: 13.5 seconds (ref 11.4–15.2)

## 2019-07-09 LAB — APTT: aPTT: 27 seconds (ref 24–36)

## 2019-07-09 LAB — GLUCOSE, CAPILLARY: Glucose-Capillary: 143 mg/dL — ABNORMAL HIGH (ref 70–99)

## 2019-07-09 NOTE — Telephone Encounter (Signed)
PT needs an updated urine sample for pre surgery. She works at [employer] and wants to know if the sample can be collected on that hospital side. Both tests have been done in our office before. Due to time restraints she isn't able to make it over there.   I asked the PT if it was through [facility] or a 3rd part collection system. She sadly didn't know.   Please advise.

## 2019-07-09 NOTE — Telephone Encounter (Signed)
Spoke with patient. She decided to bring a urine specimen to our office Monday morning to have retested after e. Coli infection.

## 2019-07-14 ENCOUNTER — Other Ambulatory Visit: Payer: Self-pay | Admitting: Physician Assistant

## 2019-07-14 DIAGNOSIS — Z01818 Encounter for other preprocedural examination: Secondary | ICD-10-CM

## 2019-07-14 DIAGNOSIS — R350 Frequency of micturition: Secondary | ICD-10-CM

## 2019-07-14 NOTE — Progress Notes (Signed)
Patient aware to take urine to lab.

## 2019-07-15 ENCOUNTER — Encounter: Payer: Self-pay | Admitting: Physician Assistant

## 2019-07-15 LAB — URINALYSIS
Bilirubin Urine: NEGATIVE
Glucose, UA: NEGATIVE
Hgb urine dipstick: NEGATIVE
Ketones, ur: NEGATIVE
Nitrite: POSITIVE — AB
Protein, ur: NEGATIVE
Specific Gravity, Urine: 1.018 (ref 1.001–1.03)
pH: <=5 (ref 5.0–8.0)

## 2019-07-15 LAB — URINE CULTURE
MICRO NUMBER:: 10562122
SPECIMEN QUALITY:: ADEQUATE

## 2019-07-15 MED ORDER — LEVOFLOXACIN 750 MG PO TABS
750.0000 mg | ORAL_TABLET | Freq: Every day | ORAL | 0 refills | Status: DC
Start: 2019-07-15 — End: 2019-09-05

## 2019-07-15 NOTE — Progress Notes (Signed)
JO,   It still appears like you have infection. Last abx was macrobid? We are culturing it to see the best antibiotic fit. Have you taken any other antibiotics.

## 2019-07-16 NOTE — H&P (Signed)
TOTAL KNEE ADMISSION H&P  Patient is being admitted for right total knee arthroplasty.  Subjective:  Chief Complaint: Right knee pain.  HPI: Amanda [name], 64 y.o. female has a history of pain and functional disability in the right knee due to arthritis and has failed non-surgical conservative treatments for greater than 12 weeks to include corticosteriod injections and activity modification. Onset of symptoms was gradual, starting several years ago with gradually worsening course since that time. The patient noted no past surgery on the right knee.  Patient currently rates pain in the right knee at 8 out of 10 with activity. Patient has worsening of pain with activity and weight bearing, pain that interferes with activities of daily living, crepitus and instability. Patient has evidence of tricompartmental bone-on-bone on the right knee by imaging studies. There is no active infection.  Patient Active Problem List   Diagnosis Date Noted  . Hammer toe of left foot 07/23/2018  . Epigastric pain 07/23/2018  . Onychomycosis 07/23/2018  . Bilateral primary osteoarthritis of knee 03/05/2018  . Gout 03/05/2018  . Chronic pain of both knees 03/05/2018  . Status post laparoscopic sleeve gastrectomy 03/04/2018  . Obesity 05/23/2010  . LEG CRAMPS 07/17/2009  . Constipation 06/21/2009  . FATTY LIVER DISEASE 09/12/2007  . Essential hypertension 05/16/2007  . ARTHRITIS 05/16/2007  . DIVERTICULOSIS, COLON 10/18/2006  . HYPERCHOLESTEROLEMIA 09/05/2006  . Factor V deficiency ([place]) 09/05/2006    Past Medical History:  Diagnosis Date  . Allergy    seasonal  . Arthritis    all over  . Blood dyscrasia    Carrys trait for Leiden Factor five never had any issues . Yhe only reason she was tested was because her mother had it  . CPAP (continuous positive airway pressure) dependence    8 cm water w/ heated humidifier  . Diabetes mellitus without complication ([place])    Pre surgery - Gastric sx 2014  Roun-Y no diabetes since  no meds  . Fatty liver 4-08   by [place]  . Gout   . Hip pain   . Hypertension   . Knee pain   . Obesity   . Pneumonia   . Recurrent UTI   . Sleep apnea    had roux en y lost 140lbs no longer needs cpap    Past Surgical History:  Procedure Laterality Date  . APPENDECTOMY  1977  . [place]   one  . CHOLECYSTECTOMY  1977  . [place]  . reduction mammoplasty  1985  . ROUX-EN-Y GASTRIC BYPASS  2014    Prior to Admission medications   Medication Sig Start Date End Date Taking? Authorizing Provider  allopurinol (ZYLOPRIM) 100 MG tablet Take 1 tablet (100 mg total) by mouth daily. 04/22/19  Yes Breeback, Jade L, PA-C  Biotin 10 MG CAPS Take 10 mg by mouth daily.    Yes [provider]  CALCIUM PO Take 600 mg by mouth daily.    Yes [provider]  Cholecalciferol (VITAMIN D) 125 MCG (5000 UT) CAPS Take 5,000 Units by mouth daily.    Yes [provider]  ferrous sulfate 324 MG TBEC Take 324 mg by mouth.   Yes [provider]  fexofenadine (ALLERGY 24-HR) 180 MG tablet Take 180 mg by mouth daily.    Yes [provider]  ibuprofen (ADVIL,MOTRIN) 200 MG tablet Take 400 mg by mouth every 6 (six) hours as needed for moderate  pain.    Yes [provider]  losartan (COZAAR) 50 MG tablet Take 1 tablet (50 mg total) by mouth 2 (two) times daily. 04/22/19  Yes Breeback, Jade L, PA-C  methocarbamol (ROBAXIN) 500 MG tablet Take 500 mg by mouth every 6 (six) hours as needed for muscle spasms. 06/26/19  Yes [provider]  Multiple Vitamins-Minerals (CENTRUM SILVER ULTRA WOMENS) TABS Take 1 tablet by mouth 2 (two) times daily.    Yes [provider]  spironolactone (ALDACTONE) 25 MG tablet Take 1 tablet (25 mg total) by mouth daily. 04/22/19  Yes Breeback, Jade L, PA-C  traMADol (ULTRAM) 50 MG tablet Take 1-2 tablet every 6 hours for moderate to severe bilateral  knee pain. Patient taking differently: Take 50-100 mg by mouth every 6 (six) hours as needed for moderate pain or severe pain.  04/22/19  Yes Breeback, Jade L, PA-C  ciprofloxacin (CIPRO) 500 MG tablet Take 1 tablet (500 mg total) by mouth 2 (two) times daily. Patient not taking: Reported on 07/02/2019 06/22/19   Samuel Bouche, NP  levofloxacin (LEVAQUIN) 750 MG tablet Take 1 tablet (750 mg total) by mouth daily. For 5 days. 07/15/19   Breeback, Royetta Car, PA-C  nitrofurantoin, macrocrystal-monohydrate, (MACROBID) 100 MG capsule Take 1 capsule (100 mg total) by mouth 2 (two) times daily. 07/04/19   Samuel Bouche, NP    Allergies  Allergen Reactions  . Fenofibrate Other (See Comments)    Constipation  . Morphine Nausea And Vomiting  . Penicillins     Unsure of reaction  . Sulfonamide Derivatives     Unsure of reaction    Social History   Socioeconomic History  . Marital status: Married    Spouse name: Not on file  . Number of children: Not on file  . Years of education: Not on file  . Highest education level: Not on file  Occupational History  . Not on file  Tobacco Use  . Smoking status: Former Smoker    Years: 15.00    Types: Cigarettes    Quit date: 2001    Years since quitting: 20.4  . Smokeless tobacco: Never Used  Substance and Sexual Activity  . Alcohol use: Yes    Comment: occasional  once a month  . Drug use: No  . Sexual activity: Not Currently    Birth control/protection: Post-menopausal  Other Topics Concern  . Not on file  Social History Narrative  . Not on file   Social Determinants of Health   Financial Resource Strain:   . Difficulty of Paying Living Expenses:   Food Insecurity:   . Worried About Charity fundraiser in the Last Year:   . Arboriculturist in the Last Year:   Transportation Needs:   . Film/video editor (Medical):   Marland Kitchen Lack of Transportation (Non-Medical):   Physical Activity:   . Days of Exercise per Week:   . Minutes of Exercise per  Session:   Stress:   . Feeling of Stress :   Social Connections:   . Frequency of Communication with Friends and Family:   . Frequency of Social Gatherings with Friends and Family:   . Attends Religious Services:   . Active Member of Clubs or Organizations:   . Attends Archivist Meetings:   Marland Kitchen Marital Status:   Intimate Partner Violence:   . Fear of Current or Ex-Partner:   . Emotionally Abused:   Marland Kitchen Physically Abused:   . Sexually Abused:  Tobacco Use: Medium Risk  . Smoking Tobacco Use: Former Smoker  . Smokeless Tobacco Use: Never Used   Social History   Substance and Sexual Activity  Alcohol Use Yes   Comment: occasional  once a month    Family History  Problem Relation Age of Onset  . Diabetes Mother   . Hypertension Mother   . Cancer Mother        breast- partial mastectomy- in her 18's  . Heart attack Father   . Hypertension Father   . Arthritis Father   . Gout Father   . Parkinsonism Father   . Alcohol abuse Maternal Grandfather   . Colon cancer Neg Hx   . Esophageal cancer Neg Hx   . Stomach cancer Neg Hx   . Rectal cancer Neg Hx     Review of Systems  Constitutional: Negative for chills and fever.  HENT: Negative for congestion, sore throat and tinnitus.   Eyes: Negative for double vision, photophobia and pain.  Respiratory: Negative for cough, shortness of breath and wheezing.   Cardiovascular: Negative for chest pain, palpitations and orthopnea.  Gastrointestinal: Negative for heartburn, nausea and vomiting.  Genitourinary: Negative for dysuria, frequency and urgency.  Musculoskeletal: Positive for joint pain.  Neurological: Negative for dizziness, weakness and headaches.    Objective:  Physical Exam: Well nourished and well developed.  General: Alert and oriented x3, cooperative and pleasant, no acute distress.  Head: normocephalic, atraumatic, neck supple.  Eyes: EOMI.  Respiratory: breath sounds clear in all fields, no  wheezing, rales, or rhonchi. Cardiovascular: Regular rate and rhythm, no murmurs, gallops or rubs.  Abdomen: non-tender to palpation and soft, normoactive bowel sounds. Musculoskeletal:  Right knee exam: No effusion. Range of motion is approximately 5 degrees to 125 degrees.  There is marked crepitus on range of motion. Tenderness lateral greater than medial. No instability.  Calves soft and nontender. Motor function intact in LE. Strength 5/5 LE bilaterally. Neuro: Distal pulses 2+. Sensation to light touch intact in LE.  Imaging Review Plain radiographs demonstrate severe degenerative joint disease of the right knee. The overall alignment is neutral. The bone quality appears to be adequate for age and reported activity level.  Assessment/Plan:  End stage arthritis, right knee   The patient history, physical examination, clinical judgment of the provider and imaging studies are consistent with end stage degenerative joint disease of the right knee and total knee arthroplasty is deemed medically necessary. The treatment options including medical management, injection therapy arthroscopy and arthroplasty were discussed at length. The risks and benefits of total knee arthroplasty were presented and reviewed. The risks due to aseptic loosening, infection, stiffness, patella tracking problems, thromboembolic complications and other imponderables were discussed. The patient acknowledged the explanation, agreed to proceed with the plan and consent was signed. Patient is being admitted for inpatient treatment for surgery, pain control, PT, OT, prophylactic antibiotics, VTE prophylaxis, progressive ambulation and ADLs and discharge planning. The patient is planning to be discharged home.   Patient's anticipated LOS is less than 2 midnights, meeting these requirements: - Younger than 36 - Lives within 1 hour of care - Has a competent adult at home to recover with post-op recover - NO history  of  - Chronic pain requiring opiods  - Diabetes  - Coronary Artery Disease  - Heart failure  - Heart attack  - Stroke  - DVT/VTE  - Cardiac arrhythmia  - Respiratory Failure/COPD  - Renal failure  - Anemia  - Advanced  Liver disease  Therapy Plans: Outpatient therapy at [facility] in [place] Disposition: Home with son Planned DVT Prophylaxis: Xarelto 10 mg QD  DME Needed: Gilford Rile, 3-in-1 PCP: Samuel Bouche, FNP TXA: IV Allergies: Morphine (nausea/vomiting) Anesthesia Concerns: None BMI: 31  Other:  - SDD - Patient has Factor V. No history of DVT/PE, will give Xarelto after surgery.  - Patient was instructed on what medications to stop prior to surgery. - Follow-up visit in 2 weeks with Dr. Wynelle Link - Begin physical therapy following surgery - Pre-operative lab work as pre-surgical testing - Prescriptions will be provided in hospital at time of discharge  Theresa Duty, PA-C Orthopedic Surgery [facility] [place]

## 2019-07-16 NOTE — Progress Notes (Signed)
Amanda [name],   Urine culture did not grow any one bacteria.if you are not having any urinary symptoms such as dysuria, fever, chills then ok to stop levaquin and proceed with surgery. Can surgeon see these results or do we need to send to him?

## 2019-07-17 ENCOUNTER — Other Ambulatory Visit (HOSPITAL_COMMUNITY)
Admission: RE | Admit: 2019-07-17 | Discharge: 2019-07-17 | Disposition: A | Discharge status: Home / Self Care | Payer: No Typology Code available for payment source | Source: Ambulatory Visit | Attending: Orthopedic Surgery | Admitting: Orthopedic Surgery

## 2019-07-17 DIAGNOSIS — Z01812 Encounter for preprocedural laboratory examination: Secondary | ICD-10-CM | POA: Diagnosis present

## 2019-07-17 DIAGNOSIS — Z20822 Contact with and (suspected) exposure to covid-19: Secondary | ICD-10-CM | POA: Insufficient documentation

## 2019-07-17 LAB — SARS CORONAVIRUS 2 (TAT 6-24 HRS): SARS Coronavirus 2: NEGATIVE

## 2019-07-20 MED ORDER — BUPIVACAINE LIPOSOME 1.3 % IJ SUSP
20.0000 mL | INTRAMUSCULAR | Status: DC
  Filled 2019-07-20: qty 20

## 2019-07-20 NOTE — Anesthesia Preprocedure Evaluation (Addendum)
Anesthesia Evaluation  Patient identified by MRN, date of birth, ID band Patient awake    Reviewed: Allergy & Precautions, NPO status , Patient's Chart, lab work & pertinent test results  Airway Mallampati: II  TM Distance: >3 FB Neck ROM: Full    Dental no notable dental hx. (+) Dental Advisory Given   Pulmonary former smoker,    Pulmonary exam normal        Cardiovascular hypertension, Pt. on medications Normal cardiovascular exam     Neuro/Psych negative neurological ROS     GI/Hepatic negative GI ROS, Neg liver ROS, S/P gastric bypass, no longer treated for OSA or DM   Endo/Other  negative endocrine ROS  Renal/GU negative Renal ROS     Musculoskeletal negative musculoskeletal ROS (+)   Abdominal   Peds  Hematology Factor 5 Leiden trait: normal coags   Anesthesia Other Findings   Reproductive/Obstetrics                            Anesthesia Physical Anesthesia Plan  ASA: II  Anesthesia Plan: Spinal   Post-op Pain Management:    Induction: Intravenous  PONV Risk Score and Plan: 3 and Ondansetron, Scopolamine patch - Pre-op and Midazolam  Airway Management Planned: Simple Face Mask  Additional Equipment:   Intra-op Plan:   Post-operative Plan:   Informed Consent: I have reviewed the patients History and Physical, chart, labs and discussed the procedure including the risks, benefits and alternatives for the proposed anesthesia with the patient or authorized representative who has indicated his/her understanding and acceptance.     Dental advisory given  Plan Discussed with: Anesthesiologist and CRNA  Anesthesia Plan Comments:         Anesthesia Quick Evaluation

## 2019-07-21 ENCOUNTER — Ambulatory Visit (HOSPITAL_COMMUNITY): Payer: No Typology Code available for payment source | Admitting: Anesthesiology

## 2019-07-21 ENCOUNTER — Ambulatory Visit (HOSPITAL_COMMUNITY)
Admission: RE | Admit: 2019-07-21 | Discharge: 2019-07-21 | Disposition: A | Discharge status: Home / Self Care | Payer: No Typology Code available for payment source | Attending: Orthopedic Surgery | Admitting: Orthopedic Surgery

## 2019-07-21 ENCOUNTER — Encounter (HOSPITAL_COMMUNITY): Payer: Self-pay | Admitting: Orthopedic Surgery

## 2019-07-21 ENCOUNTER — Encounter (HOSPITAL_COMMUNITY)
Admission: RE | Disposition: A | Discharge status: Home / Self Care | Payer: Self-pay | Source: Home / Self Care | Attending: Orthopedic Surgery

## 2019-07-21 DIAGNOSIS — D682 Hereditary deficiency of other clotting factors: Secondary | ICD-10-CM | POA: Diagnosis not present

## 2019-07-21 DIAGNOSIS — Z79890 Hormone replacement therapy (postmenopausal): Secondary | ICD-10-CM | POA: Insufficient documentation

## 2019-07-21 DIAGNOSIS — Z87891 Personal history of nicotine dependence: Secondary | ICD-10-CM | POA: Insufficient documentation

## 2019-07-21 DIAGNOSIS — Z79899 Other long term (current) drug therapy: Secondary | ICD-10-CM | POA: Insufficient documentation

## 2019-07-21 DIAGNOSIS — K5730 Diverticulosis of large intestine without perforation or abscess without bleeding: Secondary | ICD-10-CM | POA: Diagnosis not present

## 2019-07-21 DIAGNOSIS — M109 Gout, unspecified: Secondary | ICD-10-CM | POA: Diagnosis not present

## 2019-07-21 DIAGNOSIS — Z7901 Long term (current) use of anticoagulants: Secondary | ICD-10-CM | POA: Insufficient documentation

## 2019-07-21 DIAGNOSIS — E669 Obesity, unspecified: Secondary | ICD-10-CM | POA: Insufficient documentation

## 2019-07-21 DIAGNOSIS — M1710 Unilateral primary osteoarthritis, unspecified knee: Secondary | ICD-10-CM | POA: Diagnosis present

## 2019-07-21 DIAGNOSIS — K760 Fatty (change of) liver, not elsewhere classified: Secondary | ICD-10-CM | POA: Diagnosis not present

## 2019-07-21 DIAGNOSIS — G4730 Sleep apnea, unspecified: Secondary | ICD-10-CM | POA: Diagnosis not present

## 2019-07-21 DIAGNOSIS — Z9884 Bariatric surgery status: Secondary | ICD-10-CM | POA: Diagnosis not present

## 2019-07-21 DIAGNOSIS — Z6831 Body mass index (BMI) 31.0-31.9, adult: Secondary | ICD-10-CM | POA: Diagnosis not present

## 2019-07-21 DIAGNOSIS — I10 Essential (primary) hypertension: Secondary | ICD-10-CM | POA: Insufficient documentation

## 2019-07-21 DIAGNOSIS — E7800 Pure hypercholesterolemia, unspecified: Secondary | ICD-10-CM | POA: Diagnosis not present

## 2019-07-21 DIAGNOSIS — M1711 Unilateral primary osteoarthritis, right knee: Secondary | ICD-10-CM | POA: Insufficient documentation

## 2019-07-21 DIAGNOSIS — M179 Osteoarthritis of knee, unspecified: Secondary | ICD-10-CM | POA: Diagnosis present

## 2019-07-21 HISTORY — PX: TOTAL KNEE ARTHROPLASTY: SHX125

## 2019-07-21 LAB — GLUCOSE, CAPILLARY
Glucose-Capillary: 113 mg/dL — ABNORMAL HIGH (ref 70–99)
Glucose-Capillary: 120 mg/dL — ABNORMAL HIGH (ref 70–99)

## 2019-07-21 LAB — TYPE AND SCREEN
ABO/RH(D): O POS
Antibody Screen: NEGATIVE

## 2019-07-21 SURGERY — ARTHROPLASTY, KNEE, TOTAL
Anesthesia: Spinal | Site: Knee | Laterality: Right

## 2019-07-21 MED ORDER — TRAMADOL HCL 50 MG PO TABS: 50.0000 mg | ORAL_TABLET | Freq: Four times a day (QID) | ORAL | 0 refills | Status: DC | PRN

## 2019-07-21 MED ORDER — SODIUM CHLORIDE (PF) 0.9 % IJ SOLN
INTRAMUSCULAR | Status: AC
  Filled 2019-07-21: qty 50

## 2019-07-21 MED ORDER — OXYCODONE HCL 5 MG PO TABS
ORAL_TABLET | ORAL | Status: AC
  Filled 2019-07-21: qty 1

## 2019-07-21 MED ORDER — ONDANSETRON HCL 4 MG PO TABS
4.0000 mg | ORAL_TABLET | Freq: Four times a day (QID) | ORAL | Status: DC | PRN
  Filled 2019-07-21: qty 1

## 2019-07-21 MED ORDER — DEXAMETHASONE SODIUM PHOSPHATE 10 MG/ML IJ SOLN: 8.0000 mg | Freq: Once | INTRAMUSCULAR | Status: DC

## 2019-07-21 MED ORDER — ONDANSETRON HCL 4 MG/2ML IJ SOLN: 4.0000 mg | Freq: Four times a day (QID) | INTRAMUSCULAR | Status: DC | PRN

## 2019-07-21 MED ORDER — SODIUM CHLORIDE 0.9 % IR SOLN
Status: DC | PRN
  Administered 2019-07-21: 1000 mL

## 2019-07-21 MED ORDER — FENTANYL CITRATE (PF) 100 MCG/2ML IJ SOLN
INTRAMUSCULAR | Status: AC
  Administered 2019-07-21: 100 ug via INTRAVENOUS
  Filled 2019-07-21: qty 2

## 2019-07-21 MED ORDER — OXYCODONE HCL 5 MG PO TABS: 5.0000 mg | ORAL_TABLET | Freq: Four times a day (QID) | ORAL | 0 refills | Status: DC | PRN

## 2019-07-21 MED ORDER — METHOCARBAMOL 500 MG PO TABS
ORAL_TABLET | ORAL | Status: AC
  Filled 2019-07-21: qty 1

## 2019-07-21 MED ORDER — CHLORHEXIDINE GLUCONATE 0.12 % MT SOLN
15.0000 mL | Freq: Once | OROMUCOSAL | Status: AC
  Administered 2019-07-21: 15 mL via OROMUCOSAL

## 2019-07-21 MED ORDER — FENTANYL CITRATE (PF) 100 MCG/2ML IJ SOLN: 50.0000 ug | INTRAMUSCULAR | Status: AC

## 2019-07-21 MED ORDER — PROPOFOL 1000 MG/100ML IV EMUL
INTRAVENOUS | Status: AC
  Filled 2019-07-21: qty 300

## 2019-07-21 MED ORDER — METOCLOPRAMIDE HCL 5 MG PO TABS
5.0000 mg | ORAL_TABLET | Freq: Three times a day (TID) | ORAL | Status: DC | PRN
  Filled 2019-07-21: qty 2

## 2019-07-21 MED ORDER — TRANEXAMIC ACID-NACL 1000-0.7 MG/100ML-% IV SOLN
1000.0000 mg | INTRAVENOUS | Status: AC
  Administered 2019-07-21: 1000 mg via INTRAVENOUS
  Filled 2019-07-21: qty 100

## 2019-07-21 MED ORDER — LACTATED RINGERS IV BOLUS: 250.0000 mL | Freq: Once | INTRAVENOUS | Status: DC

## 2019-07-21 MED ORDER — ROPIVACAINE HCL 7.5 MG/ML IJ SOLN
INTRAMUSCULAR | Status: DC | PRN
Start: 2019-07-21 — End: 2019-07-21
  Administered 2019-07-21: 20 mL via PERINEURAL

## 2019-07-21 MED ORDER — PHENYLEPHRINE 40 MCG/ML (10ML) SYRINGE FOR IV PUSH (FOR BLOOD PRESSURE SUPPORT)
PREFILLED_SYRINGE | INTRAVENOUS | Status: AC
  Filled 2019-07-21: qty 10

## 2019-07-21 MED ORDER — RIVAROXABAN 10 MG PO TABS
10.0000 mg | ORAL_TABLET | Freq: Every day | ORAL | 0 refills | Status: DC
Start: 2019-07-21 — End: 2019-09-05

## 2019-07-21 MED ORDER — MIDAZOLAM HCL 2 MG/2ML IJ SOLN
INTRAMUSCULAR | Status: AC
  Administered 2019-07-21: 1 mg via INTRAVENOUS
  Filled 2019-07-21: qty 2

## 2019-07-21 MED ORDER — MEPIVACAINE HCL (PF) 2 % IJ SOLN
INTRAMUSCULAR | Status: DC | PRN
  Administered 2019-07-21: 60 mg via EPIDURAL

## 2019-07-21 MED ORDER — LACTATED RINGERS IV BOLUS
500.0000 mL | Freq: Once | INTRAVENOUS | Status: AC
  Administered 2019-07-21: 500 mL via INTRAVENOUS

## 2019-07-21 MED ORDER — GABAPENTIN 300 MG PO CAPS
ORAL_CAPSULE | ORAL | 0 refills | Status: DC
Start: 2019-07-21 — End: 2019-09-05

## 2019-07-21 MED ORDER — ACETAMINOPHEN 10 MG/ML IV SOLN
1000.0000 mg | Freq: Four times a day (QID) | INTRAVENOUS | Status: DC
  Administered 2019-07-21: 1000 mg via INTRAVENOUS

## 2019-07-21 MED ORDER — SODIUM CHLORIDE (PF) 0.9 % IJ SOLN
INTRAMUSCULAR | Status: DC | PRN
  Administered 2019-07-21: 60 mL

## 2019-07-21 MED ORDER — SODIUM CHLORIDE (PF) 0.9 % IJ SOLN
INTRAMUSCULAR | Status: AC
  Filled 2019-07-21: qty 10

## 2019-07-21 MED ORDER — ACETAMINOPHEN 500 MG PO TABS: 1000.0000 mg | ORAL_TABLET | Freq: Once | ORAL | Status: DC

## 2019-07-21 MED ORDER — PROPOFOL 500 MG/50ML IV EMUL
INTRAVENOUS | Status: AC
  Filled 2019-07-21: qty 50

## 2019-07-21 MED ORDER — CEFAZOLIN SODIUM-DEXTROSE 2-4 GM/100ML-% IV SOLN
2.0000 g | INTRAVENOUS | Status: AC
  Administered 2019-07-21: 2 g via INTRAVENOUS
  Filled 2019-07-21: qty 100

## 2019-07-21 MED ORDER — OXYCODONE HCL 5 MG PO TABS
5.0000 mg | ORAL_TABLET | ORAL | Status: DC | PRN
  Administered 2019-07-21: 5 mg via ORAL

## 2019-07-21 MED ORDER — ORAL CARE MOUTH RINSE: 15.0000 mL | Freq: Once | OROMUCOSAL | Status: AC

## 2019-07-21 MED ORDER — METOCLOPRAMIDE HCL 5 MG/ML IJ SOLN: 5.0000 mg | Freq: Three times a day (TID) | INTRAMUSCULAR | Status: DC | PRN

## 2019-07-21 MED ORDER — LACTATED RINGERS IV SOLN: INTRAVENOUS | Status: DC

## 2019-07-21 MED ORDER — EPHEDRINE SULFATE-NACL 50-0.9 MG/10ML-% IV SOSY
PREFILLED_SYRINGE | INTRAVENOUS | Status: DC | PRN
  Administered 2019-07-21: 10 mg via INTRAVENOUS
  Administered 2019-07-21: 15 mg via INTRAVENOUS

## 2019-07-21 MED ORDER — FENTANYL CITRATE (PF) 100 MCG/2ML IJ SOLN: 25.0000 ug | INTRAMUSCULAR | Status: DC | PRN

## 2019-07-21 MED ORDER — METHOCARBAMOL 500 MG PO TABS
500.0000 mg | ORAL_TABLET | Freq: Four times a day (QID) | ORAL | Status: DC | PRN
  Administered 2019-07-21: 500 mg via ORAL

## 2019-07-21 MED ORDER — ACETAMINOPHEN 10 MG/ML IV SOLN
INTRAVENOUS | Status: AC
  Filled 2019-07-21: qty 100

## 2019-07-21 MED ORDER — BUPIVACAINE LIPOSOME 1.3 % IJ SUSP
INTRAMUSCULAR | Status: DC | PRN
  Administered 2019-07-21: 20 mL

## 2019-07-21 MED ORDER — MIDAZOLAM HCL 2 MG/2ML IJ SOLN: 1.0000 mg | INTRAMUSCULAR | Status: AC

## 2019-07-21 MED ORDER — EPHEDRINE 5 MG/ML INJ
INTRAVENOUS | Status: AC
  Filled 2019-07-21: qty 10

## 2019-07-21 MED ORDER — PROPOFOL 1000 MG/100ML IV EMUL
INTRAVENOUS | Status: AC
  Filled 2019-07-21: qty 100

## 2019-07-21 MED ORDER — POVIDONE-IODINE 10 % EX SWAB
2.0000 "application " | Freq: Once | CUTANEOUS | Status: AC
  Administered 2019-07-21: 2 via TOPICAL

## 2019-07-21 MED ORDER — METHOCARBAMOL 500 MG PO TABS: 500.0000 mg | ORAL_TABLET | Freq: Four times a day (QID) | ORAL | 0 refills | Status: DC | PRN

## 2019-07-21 MED ORDER — METHOCARBAMOL 500 MG IVPB - SIMPLE MED: 500.0000 mg | Freq: Four times a day (QID) | INTRAVENOUS | Status: DC | PRN

## 2019-07-21 MED ORDER — PROPOFOL 500 MG/50ML IV EMUL
INTRAVENOUS | Status: DC | PRN
  Administered 2019-07-21: 75 ug/kg/min via INTRAVENOUS

## 2019-07-21 MED ORDER — ONDANSETRON HCL 4 MG/2ML IJ SOLN
INTRAMUSCULAR | Status: AC
  Filled 2019-07-21: qty 2

## 2019-07-21 MED ORDER — PROMETHAZINE HCL 25 MG/ML IJ SOLN: 6.2500 mg | INTRAMUSCULAR | Status: DC | PRN

## 2019-07-21 MED ORDER — STERILE WATER FOR IRRIGATION IR SOLN
Status: DC | PRN
  Administered 2019-07-21: 2000 mL

## 2019-07-21 SURGICAL SUPPLY — 54 items
ATTUNE PS FEM RT SZ 6 CEM KNEE (Femur) ×1 IMPLANT
ATTUNE PSRP INSR SZ6 8 KNEE (Insert) ×1 IMPLANT
BAG SPEC THK2 15X12 ZIP CLS (MISCELLANEOUS) ×1
BAG ZIPLOCK 12X15 (MISCELLANEOUS) ×2 IMPLANT
BASE TIBIA ATTUNE KNEE SYS SZ6 (Knees) IMPLANT
BLADE SAG 18X100X1.27 (BLADE) ×2 IMPLANT
BLADE SAW SGTL 11.0X1.19X90.0M (BLADE) ×2 IMPLANT
BLADE SURG SZ10 CARB STEEL (BLADE) ×4 IMPLANT
BNDG ELASTIC 6X5.8 VLCR STR LF (GAUZE/BANDAGES/DRESSINGS) ×2 IMPLANT
BOWL SMART MIX CTS (DISPOSABLE) ×2 IMPLANT
BSPLAT TIB 6 CMNT ROT PLAT STR (Knees) ×1 IMPLANT
CEMENT HV SMART SET (Cement) ×4 IMPLANT
COVER SURGICAL LIGHT HANDLE (MISCELLANEOUS) ×2 IMPLANT
COVER WAND RF STERILE (DRAPES) IMPLANT
CUFF TOURN SGL QUICK 34 (TOURNIQUET CUFF) ×2
CUFF TRNQT CYL 34X4.125X (TOURNIQUET CUFF) ×1 IMPLANT
DECANTER SPIKE VIAL GLASS SM (MISCELLANEOUS) ×2 IMPLANT
DRAPE U-SHAPE 47X51 STRL (DRAPES) ×2 IMPLANT
DRSG AQUACEL AG ADV 3.5X10 (GAUZE/BANDAGES/DRESSINGS) ×2 IMPLANT
DURAPREP 26ML APPLICATOR (WOUND CARE) ×2 IMPLANT
ELECT REM PT RETURN 15FT ADLT (MISCELLANEOUS) ×2 IMPLANT
GLOVE BIO SURGEON STRL SZ7 (GLOVE) ×2 IMPLANT
GLOVE BIO SURGEON STRL SZ8 (GLOVE) ×2 IMPLANT
GLOVE BIOGEL PI IND STRL 7.0 (GLOVE) ×1 IMPLANT
GLOVE BIOGEL PI IND STRL 8 (GLOVE) ×1 IMPLANT
GLOVE BIOGEL PI INDICATOR 7.0 (GLOVE) ×1
GLOVE BIOGEL PI INDICATOR 8 (GLOVE) ×1
GOWN STRL REUS W/TWL LRG LVL3 (GOWN DISPOSABLE) ×4 IMPLANT
HANDPIECE INTERPULSE COAX TIP (DISPOSABLE) ×2
HOLDER FOLEY CATH W/STRAP (MISCELLANEOUS) IMPLANT
IMMOBILIZER KNEE 20 (SOFTGOODS) ×2
IMMOBILIZER KNEE 20 THIGH 36 (SOFTGOODS) ×1 IMPLANT
KIT TURNOVER KIT A (KITS) IMPLANT
MANIFOLD NEPTUNE II (INSTRUMENTS) ×2 IMPLANT
NS IRRIG 1000ML POUR BTL (IV SOLUTION) ×2 IMPLANT
PACK TOTAL KNEE CUSTOM (KITS) ×2 IMPLANT
PADDING CAST COTTON 6X4 STRL (CAST SUPPLIES) ×4 IMPLANT
PATELLA MEDIAL ATTUN 35MM KNEE (Knees) ×1 IMPLANT
PENCIL SMOKE EVACUATOR (MISCELLANEOUS) IMPLANT
PIN DRILL FIX HALF THREAD (BIT) ×1 IMPLANT
PIN STEINMAN FIXATION KNEE (PIN) ×1 IMPLANT
PROTECTOR NERVE ULNAR (MISCELLANEOUS) ×2 IMPLANT
SET HNDPC FAN SPRY TIP SCT (DISPOSABLE) ×1 IMPLANT
STRIP CLOSURE SKIN 1/2X4 (GAUZE/BANDAGES/DRESSINGS) ×3 IMPLANT
SUT MNCRL AB 4-0 PS2 18 (SUTURE) ×2 IMPLANT
SUT STRATAFIX 0 PDS 27 VIOLET (SUTURE) ×2
SUT VIC AB 2-0 CT1 27 (SUTURE) ×6
SUT VIC AB 2-0 CT1 TAPERPNT 27 (SUTURE) ×3 IMPLANT
SUTURE STRATFX 0 PDS 27 VIOLET (SUTURE) ×1 IMPLANT
TIBIA ATTUNE KNEE SYS BASE SZ6 (Knees) ×2 IMPLANT
TRAY FOLEY MTR SLVR 16FR STAT (SET/KITS/TRAYS/PACK) ×2 IMPLANT
WATER STERILE IRR 1000ML POUR (IV SOLUTION) ×4 IMPLANT
WRAP KNEE MAXI GEL POST OP (GAUZE/BANDAGES/DRESSINGS) ×2 IMPLANT
YANKAUER SUCT BULB TIP 10FT TU (MISCELLANEOUS) ×2 IMPLANT

## 2019-07-21 NOTE — Anesthesia Procedure Notes (Signed)
Anesthesia Regional Block: Adductor canal block   Pre-Anesthetic Checklist: ,, timeout performed, Correct Patient, Correct Site, Correct Laterality, Correct Procedure, Correct Position, site marked, Risks and benefits discussed,  Surgical consent,  Pre-op evaluation,  At surgeon's request and post-op pain management  Laterality: Right  Prep: chloraprep       Needles:  Injection technique: Single-shot  Needle Type: Stimulator Needle - 80     Needle Length: 10cm  Needle Gauge: 21     Additional Needles:   Narrative:  Start time: 07/21/2019 8:26 AM End time: 07/21/2019 8:36 AM Injection made incrementally with aspirations every 5 mL.  Performed by: Personally

## 2019-07-21 NOTE — Evaluation (Signed)
Physical Therapy Evaluation Patient Details Name: Amanda [name] MRN: [id number] DOB: [birth year] Today's Date: 07/21/2019   History of Present Illness  Patient is 64 y.o. female s/p Rt TKA on 07/21/19 with PMH significant for HTN, gout, DM, OA, gastric bypass in 2014.  Clinical Impression  CHIANA WAMSER is a 64 y.o. female POD 0 s/p Rt TKA. Patient reports independence with mobility at baseline. Patient is now limited by functional impairments (see PT problem list below) and requires min guard/supervision for transfers and gait with RW. Patient was able to ambulate ~1x110', and 1x40' feet with RW and min guard/supervision and cues for safe walker management. Patient educated on safe sequencing for stair mobility and verbalized safe guarding position for people assisting with mobility. Patient's husband present for stair training and provided safe guarding and assist to ascend stair with reverse technique using RW. Patient instructed in exercises to facilitate ROM and circulation. Patient will benefit from continued skilled PT interventions to address impairments and progress towards PLOF. Patient's husband does have some balance impairments with mobility and uses SPC for unlevel surfaces; reports he has trouble with [place] distance but is able to negotiate home without difficulty. Patient/husband feel confident in ability to negotiate home with him providing guarding and assist as needed. This therapist encouraged pt/spouse to call on friend/family for additional help with equipment and stair mobility and pt/spouse verbalized understanding for increased support/safety with return home. Patient has met mobility goals at adequate level for discharge home; will continue to follow if pt continues acute stay to progress towards Mod I goals.     Follow Up Recommendations Outpatient PT;Follow surgeon's recommendation for DC plan and follow-up therapies    Equipment Recommendations  Rolling walker  with 5" wheels;3in1 (PT) (delivered in PACU)    Recommendations for Other Services       Precautions / Restrictions Precautions Precautions: Fall Restrictions Weight Bearing Restrictions: No      Mobility  Bed Mobility Overal bed mobility: Needs Assistance Bed Mobility: Supine to Sit;Sit to Supine     Supine to sit: Modified independent (Device/Increase time);Supervision Sit to supine: Modified independent (Device/Increase time);Supervision   General bed mobility comments: pt able to raise trunk upright and bring LE's off EOB  Transfers Overall transfer level: Needs assistance Equipment used: Rolling walker (2 wheeled) Transfers: Sit to/from Stand Sit to Stand: Min guard;Supervision         General transfer comment: cues for safe technique with rising, pt using bil UE's for power up. no assist required, guarding for safety.   Ambulation/Gait Ambulation/Gait assistance: Min guard;Supervision Gait Distance (Feet): 150 Feet (1x110, 1x40) Assistive device: Rolling walker (2 wheeled) Gait Pattern/deviations: Step-to pattern;Decreased stride length;Decreased stance time - right;Decreased weight shift to right Gait velocity: decreased   General Gait Details: cues for safe step pattern with RW and safe proximity to RW. pt required min assist initially for walker positioning and improved during gait. no buckling at Rt knee observed, no overt LOB observed. pt husband present during second bout of gait and provided guarding for pt with instruction from PT.   Stairs Stairs: Yes Stairs assistance: Min guard Stair Management: No rails;Step to pattern;Backwards;With walker Number of Stairs: 4 (2x2) General stair comments: pt's husband present for stair training. PT provided cues for safe step pattern "up with good/left, down with bad/right" and cues for spouse to guard. No overt LOB noted. Pt and husband completed second bout without cues from PT and negotiated stairs with walker  safely.  Wheelchair Mobility    Modified Rankin (Stroke Patients Only)       Balance Overall balance assessment: Needs assistance Sitting-balance support: Feet supported Sitting balance-Leahy Scale: Good     Standing balance support: During functional activity;Bilateral upper extremity supported Standing balance-Leahy Scale: Poor                Pertinent Vitals/Pain Pain Assessment: 0-10 Pain Score: 3  Pain Location: Rt knee Pain Descriptors / Indicators: Aching;Burning;Discomfort Pain Intervention(s): Limited activity within patient's tolerance;Monitored during session;Repositioned;Ice applied    Home Living Family/patient expects to be discharged to:: Private residence Living Arrangements: Spouse/significant other Available Help at Discharge: Family Type of Home: House Home Access: Stairs to enter Entrance Stairs-Rails: None Technical brewer of Steps: 1+1 Home Layout: One level Home Equipment: None      Prior Function Level of Independence: Independent         Comments: pt is an Therapist, 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    Extremity/Trunk Assessment   Upper Extremity Assessment Upper Extremity Assessment: Overall WFL for tasks assessed    Lower Extremity Assessment Lower Extremity Assessment: RLE deficits/detail RLE Deficits / Details: good quad activation, no extensor lag with SLR RLE Sensation: WNL RLE Coordination: WNL    Cervical / Trunk Assessment Cervical / Trunk Assessment: Normal  Communication   Communication: No difficulties  Cognition Arousal/Alertness: Awake/alert Behavior During Therapy: WFL for tasks assessed/performed Overall Cognitive Status: Within Functional Limits for tasks assessed                 General Comments General comments (skin integrity, edema, etc.): pt's spouse arrives to PACU in wheelchair. he is able to ambulate without SPC and uses SPC on unlevel surfaces. reports he has  difficulty with [place] distances. Pt/husband reports he has not had any falls and he is stable with walking to assist her. Pt's sposue provided safe guarding and assist with stairs and gait. PT educated pt/spouse on concerns for caregivers mobility and safety with patient. Pt/spouse verbalized understanding and remain confident in ability to mobilize safely together at home as demonstrated in therapy session.     Exercises Total Joint Exercises Ankle Circles/Pumps: AROM;Both;20 reps;Supine Quad Sets: AROM;Right;Other reps (comment);Supine (3) Towel Squeeze: AROM;Both;Other reps (comment);Supine (2) Short Arc Quad: Right;AROM;Other reps (comment);Supine (3) Heel Slides: AROM;Right;Other reps (comment);Supine (3) Hip ABduction/ADduction: AROM;Right;Other reps (comment);Supine (3) Straight Leg Raises: AROM;Right;Other reps (comment);Supine (2) Long Arc Quad: AROM;Right;Other reps (comment);Seated (3) Knee Flexion: AROM;AAROM;Right;Other reps (comment);Seated (3)   Assessment/Plan    PT Assessment Patient needs continued PT services  PT Problem List Decreased strength;Decreased range of motion;Decreased activity tolerance;Decreased balance;Decreased mobility;Decreased knowledge of use of DME;Pain;Decreased knowledge of precautions       PT Treatment Interventions DME instruction;Gait training;Stair training;Functional mobility training;Therapeutic activities;Therapeutic exercise;Balance training;Patient/family education    PT Goals (Current goals can be found in the Care Plan section)  Acute Rehab PT Goals Patient Stated Goal: get home today PT Goal Formulation: With patient Time For Goal Achievement: 07/25/19 Potential to Achieve Goals: Good    Frequency 7X/week   Barriers to discharge           AM-PAC PT "6 Clicks" Mobility  Outcome Measure Help needed turning from your back to your side while in a flat bed without using bedrails?: None Help needed moving from lying on  your back to sitting on the side of a flat bed without using bedrails?: None Help needed moving  to and from a bed to a chair (including a wheelchair)?: A Little Help needed standing up from a chair using your arms (e.g., wheelchair or bedside chair)?: A Little Help needed to walk in hospital room?: A Little Help needed climbing 3-5 steps with a railing? : A Little 6 Click Score: 20    End of Session Equipment Utilized During Treatment: Gait belt Activity Tolerance: Patient tolerated treatment well Patient left: in bed;with call bell/phone within reach;with family/visitor present Nurse Communication: Mobility status PT Visit Diagnosis: Muscle weakness (generalized) (M62.81);Difficulty in walking, not elsewhere classified (R26.2)    Time: 0211-1552 PT Time Calculation (min) (ACUTE ONLY): 66 min   Charges:   PT Evaluation $PT Eval Low Complexity: 1 Low PT Treatments $Gait Training: 23-37 mins $Therapeutic Exercise: 8-22 mins        Verner Mould, DPT Acute Rehabilitation Services  Office [phone] Pager [phone]  07/21/2019 3:07 PM

## 2019-07-21 NOTE — Anesthesia Procedure Notes (Signed)
Spinal  Patient location during procedure: OR Start time: 07/21/2019 9:12 AM End time: 07/21/2019 9:22 AM Staffing Performed: anesthesiologist  Anesthesiologist: Duane Boston, MD Preanesthetic Checklist Completed: patient identified, IV checked, risks and benefits discussed, surgical consent, monitors and equipment checked, pre-op evaluation and timeout performed Spinal Block Patient position: sitting Prep: DuraPrep Patient monitoring: cardiac monitor, continuous pulse ox and blood pressure Approach: midline Location: L2-3 Injection technique: single-shot Needle Needle type: Pencan  Needle gauge: 24 G Needle length: 9 cm Additional Notes Functioning IV was confirmed and monitors were applied. Sterile prep and drape, including hand hygiene and sterile gloves were used. The patient was positioned and the spine was prepped. The skin was anesthetized with lidocaine.  Free flow of clear CSF was obtained prior to injecting local anesthetic into the CSF.  The spinal needle aspirated freely following injection.  The needle was carefully withdrawn.  The patient tolerated the procedure well.

## 2019-07-21 NOTE — Anesthesia Procedure Notes (Deleted)
Spinal  Patient location during procedure: OR Start time: 07/21/2019 9:41 AM End time: 07/21/2019 9:41 AM Staffing Performed: anesthesiologist  Anesthesiologist: Duane Boston, MD Preanesthetic Checklist Completed: patient identified, IV checked, risks and benefits discussed, surgical consent, monitors and equipment checked, pre-op evaluation and timeout performed Spinal Block Patient position: sitting Prep: DuraPrep Patient monitoring: cardiac monitor, continuous pulse ox and blood pressure Approach: midline Injection technique: single-shot Needle Needle type: Pencan  Needle gauge: 24 G Needle length: 9 cm Additional Notes Functioning IV was confirmed and monitors were applied. Sterile prep and drape, including hand hygiene and sterile gloves were used. The patient was positioned and the spine was prepped. The skin was anesthetized with lidocaine.  Free flow of clear CSF was obtained prior to injecting local anesthetic into the CSF.  The spinal needle aspirated freely following injection.  The needle was carefully withdrawn.  The patient tolerated the procedure well.

## 2019-07-21 NOTE — Discharge Instructions (Addendum)
 Frank Aluisio, MD Total Joint Specialist [facility] [place] [address] [place], [place] [address] [phone]    TOTAL KNEE REPLACEMENT POSTOPERATIVE DIRECTIONS    Knee Rehabilitation, Guidelines Following Surgery  Results after knee surgery are often greatly improved when you follow the exercise, range of motion and muscle strengthening exercises prescribed by your doctor. Safety measures are also important to protect the knee from further injury. If any of these exercises cause you to have increased pain or swelling in your knee joint, decrease the amount until you are comfortable again and slowly increase them. If you have problems or questions, call your caregiver or physical therapist for advice.   BLOOD CLOT PREVENTION . Take a 10 mg Xarelto once a day for three weeks following surgery. Then take an 81 mg Aspirin once a day for three weeks. Then discontinue Aspirin. . You may resume your vitamins/supplements once you have discontinued the Xarelto. . Do not take any NSAIDs (Advil, Aleve, Ibuprofen, Meloxicam, etc.) until you have discontinued the Xarelto.   HOME CARE INSTRUCTIONS  . Remove items at home which could result in a fall. This includes throw rugs or furniture in walking pathways.   ICE to the affected knee as much as tolerated. Icing helps control swelling. If the swelling is well controlled you will be more comfortable and rehab easier. Continue to use ice on the knee for pain and swelling from surgery. You may notice swelling that will progress down to the foot and ankle. This is normal after surgery. Elevate the leg when you are not up walking on it.    Continue to use the breathing machine which will help keep your temperature down.  It is common for your temperature to cycle up and down following surgery, especially at night when you are not up moving around and exerting yourself.  The breathing machine keeps your lungs expanded and your  temperature down.  Do not place pillow under knee, focus on keeping the knee straight while resting  PAIN CONTROL Achieving adequate pain control can be challenging in the first 48-72 hours after the nerve block wears off. During this time it is best to stay ahead of the pain by taking your prescribed pain meds every 4-6 hours. Once the pain is well controlled (you will not be pain free but the goal is having it under control) you may begin slowly weaning off the medications  DIET You may resume your previous home diet once you are discharged from the hospital.  DRESSING / WOUND CARE / SHOWERING . Keep your bulky bandage on for 2 days. On the third post-operative day you may remove the Ace bandage and gauze. There is a waterproof adhesive bandage on your skin which will stay in place until your first follow-up appointment. Once you remove this you will not need to place another bandage . You may begin showering 3 days following surgery, but do not submerge the incision under water.  ACTIVITY For the first 5 days the key is rest and control of pain and swelling . You should rest, ice and elevate the leg for 50 minutes out of every hour. Get up and walk/stretch for 10 minutes per hour. After 5 days you can increase your activity slowly as tolerated . Walk with your walker as instructed. Use the walker until you are comfortable transitioning to a cane. Walk with the cane in the opposite hand of the operative leg. You may discontinue the cane once you   are comfortable. . You may discontinue the knee immobilizer once you are able to perform a straight leg raise while lying down . Avoid periods of inactivity such as sitting longer than an hour when not asleep. This helps prevent blood clots.  . Do your home exercises twice a day starting on post-operative day 3. On the days you go to physical therapy, just do the home exercises once that day. . Do not drive a car until released by your surgeon.  . Do  not drive while taking narcotics.  TED HOSE STOCKINGS Wear the elastic stockings on both legs for three weeks following surgery during the day. You may remove them at night for sleeping.  WEIGHT BEARING You may bear weight as tolerated on the operative leg.  POSTOPERATIVE CONSTIPATION PROTOCOL Constipation - defined medically as fewer than three stools per week and severe constipation as less than one stool per week.  One of the most common issues patients have following surgery is constipation.  Even if you have a regular bowel pattern at home, your normal regimen is likely to be disrupted due to multiple reasons following surgery.  Combination of anesthesia, postoperative narcotics, change in appetite and fluid intake all can affect your bowels.  In order to avoid complications following surgery, here are some recommendations in order to help you during your recovery period.  Colace (docusate) - Pick up an over-the-counter form of Colace or another stool softener and take twice a day as long as you are requiring postoperative pain medications.  Take with a full glass of water daily.  If you experience loose stools or diarrhea, hold the colace until you stool forms back up.  If your symptoms do not get better within 1 week or if they get worse, check with your doctor.  MiraLax (polyethylene glycol) - Pick up over-the-counter to have on hand.  MiraLax is a solution that will increase the amount of water in your bowels to assist with bowel movements.  Take as directed and can mix with a glass of water, juice, soda, coffee, or tea.  Take if you go more than two days without a movement. Do not use MiraLax more than once per day. Call your doctor if you are still constipated or irregular after using this medication for 7 days in a row.  If you continue to have problems with postoperative constipation, please contact the office for further assistance and recommendations.  If you experience "the worst  abdominal pain ever" or develop nausea or vomiting, please contact the office immediatly for further recommendations for treatment.  ITCHING  If you experience itching with your medications, try taking only a single pain pill, or even half a pain pill at a time.  You can also use Benadryl over the counter for itching or also to help with sleep.   MEDICATIONS See your medication summary on the "After Visit Summary" that the nursing staff will review with you prior to discharge.  You may have some home medications which will be placed on hold until you complete the course of blood thinner medication.  It is important for you to complete the blood thinner medication as prescribed by your surgeon.  Continue your approved medications as instructed at time of discharge.  PRECAUTIONS If you experience chest pain or shortness of breath - call 911 immediately for transfer to the hospital emergency department.  If you develop a fever greater that 101 F, purulent drainage from wound, increased redness or drainage from wound,   foul odor from the wound/dressing, or calf pain - CONTACT YOUR SURGEON.                                                   FOLLOW-UP APPOINTMENTS Make sure you keep all of your appointments after your operation with your surgeon and caregivers. You should call the office at the above phone number and make an appointment for approximately two weeks after the date of your surgery or on the date instructed by your surgeon outlined in the "After Visit Summary".  MAKE SURE YOU:  . Understand these instructions.  . Get help right away if you are not doing well or get worse.   DENTAL ANTIBIOTICS:  In most cases prophylactic antibiotics for Dental procdeures after total joint surgery are not necessary.  Exceptions are as follows:  1. History of prior total joint infection  2. Severely immunocompromised (Organ Transplant, cancer chemotherapy, Rheumatoid biologic meds such as Humera)  3.  Poorly controlled diabetes (A1C &gt; 8.0, blood glucose over 200)  If you have one of these conditions, contact your surgeon for an antibiotic prescription, prior to your dental procedure.    Pick up stool softner and laxative for home use following surgery while on pain medications. May shower starting three days after surgery. Please use a clean towel to pat the incision dry following showers. Continue to use ice for pain and swelling after surgery. Do not use any lotions or creams on the incision until instructed by your surgeon.  

## 2019-07-21 NOTE — Op Note (Signed)
OPERATIVE REPORT-TOTAL KNEE ARTHROPLASTY   Pre-operative diagnosis- Osteoarthritis  Right knee(s)  Post-operative diagnosis- Osteoarthritis Right knee(s)  Procedure-  Right  Total Knee Arthroplasty  Surgeon- Amanda Plover. Chanceler Pullin, MD  Assistant- Molli Barrows, PA-C   Anesthesia-  Adductor canal block and spinal  EBL-50 mL   Drains Hemovac  Tourniquet time-  Total Tourniquet Time Documented: Thigh (Right) - 35 minutes Total: Thigh (Right) - 35 minutes     Complications- None  Condition-PACU - hemodynamically stable.   Brief Clinical Note  Amanda [name] is a 64 y.o. year old female with end stage OA of her right knee with progressively worsening pain and dysfunction. She has constant pain, with activity and at rest and significant functional deficits with difficulties even with ADLs. She has had extensive non-op management including analgesics, injections of cortisone and viscosupplements, and home exercise program, but remains in significant pain with significant dysfunction.Radiographs show bone on bone arthritis lateral and patellofemoral. She presents now for right Total Knee Arthroplasty.    Procedure in detail---   The patient is brought into the operating room and positioned supine on the operating table. After successful administration of  Adductor canal block and spinal,   a tourniquet is placed high on the  Right thigh(s) and the lower extremity is prepped and draped in the usual sterile fashion. Time out is performed by the operating team and then the  Right lower extremity is wrapped in Esmarch, knee flexed and the tourniquet inflated to 300 mmHg.       A midline incision is made with a ten blade through the subcutaneous tissue to the level of the extensor mechanism. A fresh blade is used to make a medial parapatellar arthrotomy. Soft tissue over the proximal medial tibia is subperiosteally elevated to the joint line with a knife and into the semimembranosus bursa with a  Cobb elevator. Soft tissue over the proximal lateral tibia is elevated with attention being paid to avoiding the patellar tendon on the tibial tubercle. The patella is everted, knee flexed 90 degrees and the ACL and PCL are removed. Findings are bone on bone lateral and patellofemoral with large global osteophytes.        The drill is used to create a starting hole in the distal femur and the canal is thoroughly irrigated with sterile saline to remove the fatty contents. The 5 degree Right  valgus alignment guide is placed into the femoral canal and the distal femoral cutting block is pinned to remove 9 mm off the distal femur. Resection is made with an oscillating saw.      The tibia is subluxed forward and the menisci are removed. The extramedullary alignment guide is placed referencing proximally at the medial aspect of the tibial tubercle and distally along the second metatarsal axis and tibial crest. The block is pinned to remove 42mm off the more deficient lateral  side. Resection is made with an oscillating saw. Size 6is the most appropriate size for the tibia and the proximal tibia is prepared with the modular drill and keel punch for that size.      The femoral sizing guide is placed and size 6 is most appropriate. Rotation is marked off the epicondylar axis and confirmed by creating a rectangular flexion gap at 90 degrees. The size 6 cutting block is pinned in this rotation and the anterior, posterior and chamfer cuts are made with the oscillating saw. The intercondylar block is then placed and that cut is made.  Trial size 6 tibial component, trial size 6 posterior stabilized femur and a 8  mm posterior stabilized rotating platform insert trial is placed. Full extension is achieved with excellent varus/valgus and anterior/posterior balance throughout full range of motion. The patella is everted and thickness measured to be 22  mm. Free hand resection is taken to 12 mm, a 35 template is placed, lug  holes are drilled, trial patella is placed, and it tracks normally. Osteophytes are removed off the posterior femur with the trial in place. All trials are removed and the cut bone surfaces prepared with pulsatile lavage. Cement is mixed and once ready for implantation, the size 6 tibial implant, size  6 posterior stabilized femoral component, and the size 35 patella are cemented in place and the patella is held with the clamp. The trial insert is placed and the knee held in full extension. The Exparel (20 ml mixed with 60 ml saline) is injected into the extensor mechanism, posterior capsule, medial and lateral gutters and subcutaneous tissues.  All extruded cement is removed and once the cement is hard the permanent 8 mm posterior stabilized rotating platform insert is placed into the tibial tray.      The wound is copiously irrigated with saline solution and the extensor mechanism closed over a hemovac drain with #1 V-loc suture. The tourniquet is released for a total tourniquet time of 34  minutes. Flexion against gravity is 140 degrees and the patella tracks normally. Subcutaneous tissue is closed with 2.0 vicryl and subcuticular with running 4.0 Monocryl. The incision is cleaned and dried and steri-strips and a bulky sterile dressing are applied. The limb is placed into a knee immobilizer and the patient is awakened and transported to recovery in stable condition.      Please note that a surgical assistant was a medical necessity for this procedure in order to perform it in a safe and expeditious manner. Surgical assistant was necessary to retract the ligaments and vital neurovascular structures to prevent injury to them and also necessary for proper positioning of the limb to allow for anatomic placement of the prosthesis.   Amanda Plover Jaquawn Saffran, MD    07/21/2019, 10:19 AM

## 2019-07-21 NOTE — Transfer of Care (Signed)
Immediate Anesthesia Transfer of Care Note  Patient: Amanda [name]  Procedure(s) Performed: TOTAL KNEE ARTHROPLASTY (Right Knee)  Patient Location: PACU  Anesthesia Type:Epidural and MAC combined with regional for post-op pain  Level of Consciousness: awake and oriented  Airway & Oxygen Therapy: Patient Spontanous Breathing and Patient connected to face mask oxygen  Post-op Assessment: Report given to RN and Post -op Vital signs reviewed and stable  Post vital signs: Reviewed and stable  Last Vitals:  Vitals Value Taken Time  BP 109/62 07/21/19 1042  Temp    Pulse 77 07/21/19 1045  Resp 19 07/21/19 1045  SpO2 100 % 07/21/19 1045  Vitals shown include unvalidated device data.  Last Pain:  Vitals:   07/21/19 0900  TempSrc:   PainSc: 0-No pain      Patients Stated Pain Goal: 1 (47/84/12 8208)  Complications: No complications documented.

## 2019-07-21 NOTE — Progress Notes (Signed)
Assisted Dr. Singer with right, ultrasound guided, adductor canal block. Side rails up, monitors on throughout procedure. See vital signs in flow sheet. Tolerated Procedure well.  

## 2019-07-21 NOTE — Interval H&P Note (Signed)
History and Physical Interval Note:  07/21/2019 7:08 AM  Amanda [name]  has presented today for surgery, with the diagnosis of right knee osteoarthritis.  The various methods of treatment have been discussed with the patient and family. After consideration of risks, benefits and other options for treatment, the patient has consented to  Procedure(s) with comments: TOTAL KNEE ARTHROPLASTY (Right) - 7min as a surgical intervention.  The patient's history has been reviewed, patient examined, no change in status, stable for surgery.  I have reviewed the patient's chart and labs.  Questions were answered to the patient's satisfaction.     Pilar Plate Fredericka Bottcher

## 2019-07-21 NOTE — Anesthesia Postprocedure Evaluation (Signed)
Anesthesia Post Note  Patient: Amanda [name]  Procedure(s) Performed: TOTAL KNEE ARTHROPLASTY (Right Knee)     Patient location during evaluation: PACU Anesthesia Type: Spinal Level of consciousness: awake and alert Pain management: pain level controlled Vital Signs Assessment: post-procedure vital signs reviewed and stable Respiratory status: spontaneous breathing and respiratory function stable Cardiovascular status: blood pressure returned to baseline and stable Postop Assessment: spinal receding Anesthetic complications: no   No complications documented.  Last Vitals:  Vitals:   07/21/19 1115 07/21/19 1130  BP: 130/70 (!) 148/81  Pulse: 60 61  Resp: 16 13  Temp:  (!) 36.3 C  SpO2: 98% 98%    Last Pain:  Vitals:   07/21/19 1130  TempSrc:   PainSc: 0-No pain                 Huong Luthi DANIEL

## 2019-07-22 ENCOUNTER — Encounter (HOSPITAL_COMMUNITY): Payer: Self-pay | Admitting: Orthopedic Surgery

## 2019-07-24 ENCOUNTER — Other Ambulatory Visit: Payer: Self-pay

## 2019-07-24 ENCOUNTER — Ambulatory Visit (INDEPENDENT_AMBULATORY_CARE_PROVIDER_SITE_OTHER): Payer: No Typology Code available for payment source | Admitting: Rehabilitative and Restorative Service Providers"

## 2019-07-24 ENCOUNTER — Encounter: Payer: Self-pay | Admitting: Rehabilitative and Restorative Service Providers"

## 2019-07-24 DIAGNOSIS — M25561 Pain in right knee: Secondary | ICD-10-CM

## 2019-07-24 DIAGNOSIS — R269 Unspecified abnormalities of gait and mobility: Secondary | ICD-10-CM

## 2019-07-24 DIAGNOSIS — M6281 Muscle weakness (generalized): Secondary | ICD-10-CM

## 2019-07-24 DIAGNOSIS — R29898 Other symptoms and signs involving the musculoskeletal system: Secondary | ICD-10-CM | POA: Diagnosis not present

## 2019-07-24 NOTE — Therapy (Addendum)
[facility] [place], [place], [address] Phone: [phone]   Fax:  [phone]  Physical Therapy Evaluation  Patient Details  Name: Amanda [name] MRN: [id number] Date of Birth: 1955/02/24 Referring Provider (PT): Dr Gaynelle Arabian   Encounter Date: 07/24/2019   PT End of Session - 07/24/19 1159    Visit Number 1    Number of Visits 24    Date for PT Re-Evaluation 10/16/19    PT Start Time 9937    PT Stop Time 1108    PT Time Calculation (min) 53 min    Activity Tolerance Patient tolerated treatment well    Behavior During Therapy [facility] for tasks assessed/performed           Past Medical History:  Diagnosis Date  . Allergy    seasonal  . Arthritis    all over  . Blood dyscrasia    Carrys trait for Leiden Factor five never had any issues . Yhe only reason she was tested was because her mother had it  . CPAP (continuous positive airway pressure) dependence    8 cm water w/ heated humidifier  . Diabetes mellitus without complication ([place])    Pre surgery - Gastric sx 2014 Roun-Y no diabetes since  no meds  . Fatty liver 4-08   by [place]  . Gout   . Hip pain   . Hypertension   . Knee pain   . Obesity   . Pneumonia   . Recurrent UTI   . Sleep apnea    had roux en y lost 140lbs no longer needs cpap    Past Surgical History:  Procedure Laterality Date  . APPENDECTOMY  1977  . [place]   one  . CHOLECYSTECTOMY  1977  . [place]  . reduction mammoplasty  1985  . ROUX-EN-Y GASTRIC BYPASS  2014  . TOTAL KNEE ARTHROPLASTY Right 07/21/2019   Procedure: TOTAL KNEE ARTHROPLASTY;  Surgeon: Gaynelle Arabian, MD;  Location: [facility];  Service: Orthopedics;  Laterality: Right;  61min    There were no vitals filed for this visit.    Subjective Assessment - 07/24/19 1021    Subjective Patient reports Rt knee for several years. Underwent TKA 1/69/67 with no  complications.    Pertinent History arthritis; Fx Lt patella 2018; LBP and Rt hip pain; HTN    Diagnostic tests xrays    Patient Stated Goals rehab the knee and return to normal functional and work activities    Currently in Pain? Yes    Pain Score 5     Pain Location Knee    Pain Orientation Right    Pain Descriptors / Indicators Sharp;Aching    Pain Type Surgical pain;Chronic pain    Pain Radiating Towards into thigh    Pain Onset In the past 7 days    Pain Frequency Intermittent    Aggravating Factors  movement    Pain Relieving Factors sitting still; meds; ice              [facility] PT Assessment - 07/24/19 0001      Assessment   Medical Diagnosis Rt TKA    Referring Provider (PT) Dr Gaynelle Arabian    Onset Date/Surgical Date 07/21/19   arthritic changes x several years    Hand Dominance Right    Next MD Visit 08/05/19    Prior Therapy here for hip pain  Precautions   Precaution Comments TKA       Restrictions   Weight Bearing Restrictions No      Balance Screen   Has the patient fallen in the past 6 months Yes    How many times? 1   fell yesterday onto buttocks - no injury    Has the patient had a decrease in activity level because of a fear of falling?  No    Is the patient reluctant to leave their home because of a fear of falling?  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     Leisure household chores; grandchild care       Observation/Other Assessments   Skin Integrity bandage dry and in pace Rt knee     Focus on  Therapeutic Outcomes (FOTO)  79% limitation       Observation/Other Assessments-Edema    Edema --   moderate edema Rt knee into thigh and calf      Sensation   Additional Comments WFL's per pt report       Posture/Postural Control   Posture Comments flexed forward at hips       AROM   Right/Left Hip --   tight Rt hip    Right Knee Extension -19    Right Knee Flexion 90    Left Knee Extension -7    Left Knee Flexion 120    Right/Left Ankle --   WFL's      Strength   Left Hip Flexion 5/5    Left Knee Flexion 5/5    Left Knee Extension 5/5      Flexibility   Hamstrings tight Rt 45 deg     ITB tight Rt     Piriformis tight Rt       Ambulation/Gait   Gait Comments ambulates with rolling walker step through gait pattern limp Rt LE in Wt bearing Rt flexed forward posture                       Objective measurements completed on examination: See above findings.       [facility] Adult PT Treatment/Exercise - 07/24/19 0001      Knee/Hip Exercises: Stretches   Hip Flexor Stretch Right;5 reps;10 seconds   seated scooting forward for knee flexion    Hip Flexor Stretch Limitations supine hip and knee flexion with strap at thigh/towel under heel 10 sec hold x 5 reps       Knee/Hip Exercises: Supine   Quad Sets AROM;Strengthening;Right;10 reps   5 sec hold    Straight Leg Raises AROM;Strengthening;Right;5 reps   5 sec hold      Vasopneumatic   Number Minutes Vasopneumatic  10 minutes    Vasopnuematic Location  Knee   Rt   Vasopneumatic Pressure Medium    Vasopneumatic Temperature  34  PT Education - 07/24/19 1104    Education Details HEP POC    Person(s) Educated Patient    Methods Explanation;Demonstration;Tactile cues;Verbal cues;Handout    Comprehension Verbalized understanding;Returned demonstration;Verbal cues required;Tactile cues required;Need further instruction            PT Short Term Goals - 07/24/19 1209      PT SHORT  TERM GOAL #1   Title Patient to demonstrate improved gait pattern with walker progressing to cane as tolerated    Time 6    Period Weeks    Status New    Target Date 09/04/19      PT SHORT TERM GOAL #2   Title Increase AROM Rt knee to 110 deg flexion and (-) 5 deg extension or better    Time 6    Period Weeks    Status New    Target Date 09/04/19      PT SHORT TERM GOAL #3   Title Independent in initial HEP    Time 6    Period Weeks    Status New    Target Date 09/04/19             PT Long Term Goals - 07/24/19 1211      PT LONG TERM GOAL #1   Title Improve Rt knee extension to (-) 3 to 0 degrees and knee flexion to 120 deg    Time 12    Period Weeks    Status New    Target Date 10/16/19      PT LONG TERM GOAL #2   Title Increase strength bilat hips to 5/5 in hip extension and abduction    Time 12    Period Weeks    Status New    Target Date 10/16/19      PT LONG TERM GOAL #3   Title Independent in ambulation for community distances with no assistive device    Time 12    Period Weeks    Status New    Target Date 10/16/19      PT LONG TERM GOAL #4   Time 12    Period Weeks    Status New    Target Date 10/16/19      PT LONG TERM GOAL #5   Title Improve FOTO to </= 55% limitation    Time 12    Period Weeks    Status New    Target Date 10/16/19                  Plan - 07/24/19 1205    Clinical Impression Statement Patient presents s/p Rt TKA 07/21/19 with decreased ROM; strength; mobility; ambulatory status and ADL's/work ability. She has moderate edema and pain in the Rt LE. Patient will benefit from PT to address problems identified.    Stability/Clinical Decision Making Stable/Uncomplicated    Clinical Decision Making Low    Rehab Potential Good    PT Frequency 2x / week    PT Duration 12 weeks    PT Treatment/Interventions Patient/family education;ADLs/Self Care Home Management;Cryotherapy;Electrical Stimulation;Iontophoresis 4mg /ml  Dexamethasone;Moist Heat;Ultrasound;Gait training;Stair training;Functional mobility training;Therapeutic activities;Therapeutic exercise;Balance training;Neuromuscular re-education;Manual techniques;Passive range of motion;Dry needling;Taping    PT Next Visit Plan Review and progress HEP; work on ROM with focus on gaining full knee extension; passive stretch into knee extension during PT; strengthening; gait training; modalities and manual work as indicated    [place] and Agree with Plan of Care Patient  Patient will benefit from skilled therapeutic intervention in order to improve the following deficits and impairments:  Decreased range of motion, Difficulty walking, Abnormal gait, Pain, Decreased activity tolerance, Decreased balance, Impaired flexibility, Decreased mobility, Decreased strength, Increased edema  Visit Diagnosis: Acute pain of right knee - Plan: PT plan of care cert/re-cert  Muscle weakness (generalized) - Plan: PT plan of care cert/re-cert  Other symptoms and signs involving the musculoskeletal system - Plan: PT plan of care cert/re-cert  Abnormal gait - Plan: PT plan of care cert/re-cert     Problem List Patient Active Problem List   Diagnosis Date Noted  . OA (osteoarthritis) of knee 07/21/2019  . Hammer toe of left foot 07/23/2018  . Epigastric pain 07/23/2018  . Onychomycosis 07/23/2018  . Bilateral primary osteoarthritis of knee 03/05/2018  . Gout 03/05/2018  . Chronic pain of both knees 03/05/2018  . Status post laparoscopic sleeve gastrectomy 03/04/2018  . Obesity 05/23/2010  . LEG CRAMPS 07/17/2009  . Constipation 06/21/2009  . FATTY LIVER DISEASE 09/12/2007  . Essential hypertension 05/16/2007  . ARTHRITIS 05/16/2007  . DIVERTICULOSIS, COLON 10/18/2006  . HYPERCHOLESTEROLEMIA 09/05/2006  . Factor V deficiency ([place]) 09/05/2006    Esteen Delpriore Nilda Simmer PT, MPH  07/24/2019, 1:44 PM  [facility] [place], [place], [address] Phone: [phone]   Fax:  [phone]  Name: EIMAN MARET MRN: [id number] Date of Birth: [birth year]

## 2019-07-24 NOTE — Patient Instructions (Signed)
Access Code: [email or web address]: [email or web address]: 06/17/2021Prepared by: Elizjah Noblet HoltExercises  Supine Quad Set - 2 x daily - 7 x weekly - 1 sets - 10 reps - 5-10 sec hold  Small Range Straight Leg Raise - 2 x daily - 7 x weekly - 1 sets - 10 reps - 3-5sec hold  Supine Knee Flexion Self Mobilization - 2 x daily - 7 x weekly - 1 sets - 10 reps - 10 sec hold  Seated Knee Flexion Extension AROM - 2 x daily - 7 x weekly - 1 sets - 10 reps - 10 sec hold

## 2019-07-28 ENCOUNTER — Encounter: Payer: Self-pay | Admitting: Rehabilitative and Restorative Service Providers"

## 2019-07-28 ENCOUNTER — Ambulatory Visit (INDEPENDENT_AMBULATORY_CARE_PROVIDER_SITE_OTHER): Payer: No Typology Code available for payment source | Admitting: Rehabilitative and Restorative Service Providers"

## 2019-07-28 ENCOUNTER — Other Ambulatory Visit: Payer: Self-pay

## 2019-07-28 DIAGNOSIS — R269 Unspecified abnormalities of gait and mobility: Secondary | ICD-10-CM

## 2019-07-28 DIAGNOSIS — M25561 Pain in right knee: Secondary | ICD-10-CM | POA: Diagnosis not present

## 2019-07-28 DIAGNOSIS — R29898 Other symptoms and signs involving the musculoskeletal system: Secondary | ICD-10-CM | POA: Diagnosis not present

## 2019-07-28 DIAGNOSIS — M6281 Muscle weakness (generalized): Secondary | ICD-10-CM | POA: Diagnosis not present

## 2019-07-28 NOTE — Therapy (Signed)
[place], [place], [address] Phone: [phone]   Fax:  [phone]  Physical Therapy Treatment  Patient Details  Name: Amanda [name] MRN: [id number] Date of Birth: 08-10-1955 Referring Provider (PT): Dr Gaynelle Arabian   Encounter Date: 07/28/2019   PT End of Session - 07/28/19 1111    Visit Number 2    Number of Visits 24    Date for PT Re-Evaluation 10/16/19    PT Start Time 1105    PT Stop Time 9211    PT Time Calculation (min) 51 min    Activity Tolerance Patient tolerated treatment well           Past Medical History:  Diagnosis Date  . Allergy    seasonal  . Arthritis    all over  . Blood dyscrasia    Carrys trait for Leiden Factor five never had any issues . Yhe only reason she was tested was because her mother had it  . CPAP (continuous positive airway pressure) dependence    8 cm water w/ heated humidifier  . Diabetes mellitus without complication ([place])    Pre surgery - Gastric sx 2014 Roun-Y no diabetes since  no meds  . Fatty liver 4-08   by [place]  . Gout   . Hip pain   . Hypertension   . Knee pain   . Obesity   . Pneumonia   . Recurrent UTI   . Sleep apnea    had roux en y lost 140lbs no longer needs cpap    Past Surgical History:  Procedure Laterality Date  . APPENDECTOMY  1977  . [place]   one  . CHOLECYSTECTOMY  1977  . [place]  . reduction mammoplasty  1985  . ROUX-EN-Y GASTRIC BYPASS  2014  . TOTAL KNEE ARTHROPLASTY Right 07/21/2019   Procedure: TOTAL KNEE ARTHROPLASTY;  Surgeon: Gaynelle Arabian, MD;  Location: [facility];  Service: Orthopedics;  Laterality: Right;  59min    There were no vitals filed for this visit.   Subjective Assessment - 07/28/19 1112    Subjective Patient reports that she has ot had a weekend mentally. She has been confused and has not been able to do her exercises. Some better  today.    Currently in Pain? Yes    Pain Score 8     Pain Location Knee    Pain Orientation Right    Pain Descriptors / Indicators Aching;Dull    Pain Type Surgical pain;Chronic pain                             [facility] Adult PT Treatment/Exercise - 07/28/19 0001      Knee/Hip Exercises: Stretches   Hip Flexor Stretch Right   10 reps 5 sec hold using strap to assist with ROM    Hip Flexor Stretch Limitations supine hip and knee flexion with strap at thigh/towel under heel 10 sec hold x 5 reps       Knee/Hip Exercises: Aerobic   Nustep L4 x 6 min for ROM - UE at 10       Knee/Hip Exercises: Supine   Quad Sets AROM;Strengthening;Right;10 reps    Short Arc Quad Sets AROM;Strengthening;Right;10 reps   5 sec hold    Straight Leg Raises AROM;Strengthening;Right;5 reps   5 sec hold      Cryotherapy  Number Minutes Cryotherapy 10 Minutes    Cryotherapy Location Knee   Rt    Type of Cryotherapy Ice pack                  PT Education - 07/28/19 1140    Education Details HEP    Person(s) Educated Patient    Methods Explanation;Demonstration;Tactile cues;Verbal cues;Handout    Comprehension Verbalized understanding;Returned demonstration;Verbal cues required;Tactile cues required            PT Short Term Goals - 07/24/19 1209      PT SHORT TERM GOAL #1   Title Patient to demonstrate improved gait pattern with walker progressing to cane as tolerated    Time 6    Period Weeks    Status New    Target Date 09/04/19      PT SHORT TERM GOAL #2   Title Increase AROM Rt knee to 110 deg flexion and (-) 5 deg extension or better    Time 6    Period Weeks    Status New    Target Date 09/04/19      PT SHORT TERM GOAL #3   Title Independent in initial HEP    Time 6    Period Weeks    Status New    Target Date 09/04/19             PT Long Term Goals - 07/24/19 1211      PT LONG TERM GOAL #1   Title Improve Rt knee extension to (-) 3 to 0 degrees  and knee flexion to 120 deg    Time 12    Period Weeks    Status New    Target Date 10/16/19      PT LONG TERM GOAL #2   Title Increase strength bilat hips to 5/5 in hip extension and abduction    Time 12    Period Weeks    Status New    Target Date 10/16/19      PT LONG TERM GOAL #3   Title Independent in ambulation for community distances with no assistive device    Time 12    Period Weeks    Status New    Target Date 10/16/19      PT LONG TERM GOAL #4   Time 12    Period Weeks    Status New    Target Date 10/16/19      PT LONG TERM GOAL #5   Title Improve FOTO to </= 55% limitation    Time 12    Period Weeks    Status New    Target Date 10/16/19                 Plan - 07/28/19 1129    Clinical Impression Statement Patient reports decreased mental status over the weekend. She reports that she is feeling some better today - thinks mental changes could be related to pain meds. Patient has not down exercises since she was here. REviewed exercises, added SAQ and continued education re importance of exercises and maintaining correct form. Encouraged consistent HEP.    Rehab Potential Good    PT Frequency 2x / week    PT Duration 12 weeks    PT Treatment/Interventions Patient/family education;ADLs/Self Care Home Management;Cryotherapy;Electrical Stimulation;Iontophoresis 4mg /ml Dexamethasone;Moist Heat;Ultrasound;Gait training;Stair training;Functional mobility training;Therapeutic activities;Therapeutic exercise;Balance training;Neuromuscular re-education;Manual techniques;Passive range of motion;Dry needling;Taping    PT Home Exercise Plan [email or web address]    Consulted and Agree with Plan of  Care Patient           Patient will benefit from skilled therapeutic intervention in order to improve the following deficits and impairments:     Visit Diagnosis: Acute pain of right knee  Muscle weakness (generalized)  Other symptoms and signs involving the musculoskeletal  system  Abnormal gait     Problem List Patient Active Problem List   Diagnosis Date Noted  . OA (osteoarthritis) of knee 07/21/2019  . Hammer toe of left foot 07/23/2018  . Epigastric pain 07/23/2018  . Onychomycosis 07/23/2018  . Bilateral primary osteoarthritis of knee 03/05/2018  . Gout 03/05/2018  . Chronic pain of both knees 03/05/2018  . Status post laparoscopic sleeve gastrectomy 03/04/2018  . Obesity 05/23/2010  . LEG CRAMPS 07/17/2009  . Constipation 06/21/2009  . FATTY LIVER DISEASE 09/12/2007  . Essential hypertension 05/16/2007  . ARTHRITIS 05/16/2007  . DIVERTICULOSIS, COLON 10/18/2006  . HYPERCHOLESTEROLEMIA 09/05/2006  . Factor V deficiency ([place]) 09/05/2006    Tjay Velazquez Nilda Simmer PT, MPH  07/28/2019, 11:50 AM  [facility] [place], [place], [address] Phone: [phone]   Fax:  [phone]  Name: GALIT URICH MRN: [id number] Date of Birth: Nov 11, 1955

## 2019-07-28 NOTE — Patient Instructions (Signed)
Access Code: [email or web address]: [email or web address]: 06/21/2021Prepared by: Tarren Velardi HoltExercises  Supine Quad Set - 2 x daily - 7 x weekly - 1 sets - 10 reps - 5-10 sec hold  Small Range Straight Leg Raise - 2 x daily - 7 x weekly - 1 sets - 10 reps - 3-5sec hold  Supine Knee Flexion Self Mobilization - 2 x daily - 7 x weekly - 1 sets - 10 reps - 10 sec hold  Seated Knee Flexion Extension AROM - 2 x daily - 7 x weekly - 1 sets - 10 reps - 10 sec hold  Supine Knee Extension Strengthening - 2 x daily - 7 x weekly - 1 sets - 3 reps - 30 sec hold

## 2019-07-30 ENCOUNTER — Other Ambulatory Visit: Payer: Self-pay

## 2019-07-30 ENCOUNTER — Ambulatory Visit (INDEPENDENT_AMBULATORY_CARE_PROVIDER_SITE_OTHER): Payer: No Typology Code available for payment source | Admitting: Rehabilitative and Restorative Service Providers"

## 2019-07-30 DIAGNOSIS — R29898 Other symptoms and signs involving the musculoskeletal system: Secondary | ICD-10-CM

## 2019-07-30 DIAGNOSIS — R269 Unspecified abnormalities of gait and mobility: Secondary | ICD-10-CM

## 2019-07-30 DIAGNOSIS — M25561 Pain in right knee: Secondary | ICD-10-CM | POA: Diagnosis not present

## 2019-07-30 DIAGNOSIS — M6281 Muscle weakness (generalized): Secondary | ICD-10-CM | POA: Diagnosis not present

## 2019-07-30 NOTE — Therapy (Signed)
[place], [place], [address] Phone: [phone]   Fax:  [phone]  Physical Therapy Treatment  Patient Details  Name: Amanda [name] MRN: [id number] Date of Birth: 06/23/1955 Referring Provider (PT): Dr Gaynelle Arabian   Encounter Date: 07/30/2019   PT End of Session - 07/30/19 1129    Visit Number 3    Number of Visits 24    Date for PT Re-Evaluation 10/16/19    PT Start Time 1101    PT Stop Time 1150    PT Time Calculation (min) 49 min    Activity Tolerance Patient tolerated treatment well           Past Medical History:  Diagnosis Date  . Allergy    seasonal  . Arthritis    all over  . Blood dyscrasia    Carrys trait for Leiden Factor five never had any issues . Yhe only reason she was tested was because her mother had it  . CPAP (continuous positive airway pressure) dependence    8 cm water w/ heated humidifier  . Diabetes mellitus without complication ([place])    Pre surgery - Gastric sx 2014 Roun-Y no diabetes since  no meds  . Fatty liver 4-08   by [place]  . Gout   . Hip pain   . Hypertension   . Knee pain   . Obesity   . Pneumonia   . Recurrent UTI   . Sleep apnea    had roux en y lost 140lbs no longer needs cpap    Past Surgical History:  Procedure Laterality Date  . APPENDECTOMY  1977  . [place]   one  . CHOLECYSTECTOMY  1977  . [place]  . reduction mammoplasty  1985  . ROUX-EN-Y GASTRIC BYPASS  2014  . TOTAL KNEE ARTHROPLASTY Right 07/21/2019   Procedure: TOTAL KNEE ARTHROPLASTY;  Surgeon: Gaynelle Arabian, MD;  Location: [facility];  Service: Orthopedics;  Laterality: Right;  76min    There were no vitals filed for this visit.   Subjective Assessment - 07/30/19 1129    Subjective Less confused. Working on her exercises more consistently at home. Pain with ROM    Currently in Pain? Yes    Pain Score 2     Pain  Location Knee    Pain Orientation Right    Pain Descriptors / Indicators Aching;Dull              [facility] PT Assessment - 07/30/19 0001      Assessment   Medical Diagnosis Rt TKA    Referring Provider (PT) Dr Gaynelle Arabian    Onset Date/Surgical Date 07/21/19   arthritic changes x several years    Hand Dominance Right    Next MD Visit 08/05/19    Prior Therapy here for hip pain       AROM   Right Knee Extension -17    Right Knee Flexion 105    Left Knee Extension -7    Left Knee Flexion 120                         [facility] Adult PT Treatment/Exercise - 07/30/19 0001      Knee/Hip Exercises: Stretches   Passive Hamstring Stretch Right;3 reps;30 seconds   supine w/strap - pushing down at thigh to increase knee ext   Hip Flexor Stretch Right  10 reps 5 sec hold using strap to assist with ROM    Hip Flexor Stretch Limitations supine hip and knee flexion with strap at thigh/towel under heel 10 sec hold x 5 reps       Knee/Hip Exercises: Aerobic   Nustep L5 x 6 min for ROM - UE at 10       Knee/Hip Exercises: Seated   Knee/Hip Flexion seated scoot forward to incresae knee flexion 10 sec x 5 reps     Sit to Sand 10 reps;with UE support   working to move chest over knees up and down      Knee/Hip Exercises: Supine   Quad Sets AROM;Strengthening;Right;10 reps    Short Arc Quad Sets AROM;Strengthening;Right;10 reps   5 sec hold    Straight Leg Raises AROM;Strengthening;Right;5 reps   5 sec hold      Vasopneumatic   Number Minutes Vasopneumatic  10 minutes    Vasopnuematic Location  Knee   Rt    Vasopneumatic Pressure Medium    Vasopneumatic Temperature  34      Manual Therapy   Passive ROM PT assist pushing Rt knee into extension 10 sec hold x 2 reps                     PT Short Term Goals - 07/24/19 1209      PT SHORT TERM GOAL #1   Title Patient to demonstrate improved gait pattern with walker progressing to cane as tolerated    Time 6     Period Weeks    Status New    Target Date 09/04/19      PT SHORT TERM GOAL #2   Title Increase AROM Rt knee to 110 deg flexion and (-) 5 deg extension or better    Time 6    Period Weeks    Status New    Target Date 09/04/19      PT SHORT TERM GOAL #3   Title Independent in initial HEP    Time 6    Period Weeks    Status New    Target Date 09/04/19             PT Long Term Goals - 07/24/19 1211      PT LONG TERM GOAL #1   Title Improve Rt knee extension to (-) 3 to 0 degrees and knee flexion to 120 deg    Time 12    Period Weeks    Status New    Target Date 10/16/19      PT LONG TERM GOAL #2   Title Increase strength bilat hips to 5/5 in hip extension and abduction    Time 12    Period Weeks    Status New    Target Date 10/16/19      PT LONG TERM GOAL #3   Title Independent in ambulation for community distances with no assistive device    Time 12    Period Weeks    Status New    Target Date 10/16/19      PT LONG TERM GOAL #4   Time 12    Period Weeks    Status New    Target Date 10/16/19      PT LONG TERM GOAL #5   Title Improve FOTO to </= 55% limitation    Time 12    Period Weeks    Status New    Target Date 10/16/19  Plan - 07/30/19 1130    Clinical Impression Statement Improved mental status. Continues to have limited ROM; strength; functional activities. very limited knee extension. Encouraged patient to work on knee extension consistently at home.    PT Frequency 2x / week    PT Duration 12 weeks    PT Treatment/Interventions Patient/family education;ADLs/Self Care Home Management;Cryotherapy;Electrical Stimulation;Iontophoresis 4mg /ml Dexamethasone;Moist Heat;Ultrasound;Gait training;Stair training;Functional mobility training;Therapeutic activities;Therapeutic exercise;Balance training;Neuromuscular re-education;Manual techniques;Passive range of motion;Dry needling;Taping    PT Next Visit Plan Review and progress HEP;  work on ROM with focus on gaining full knee extension; passive stretch into knee extension during PT; strengthening; gait training; modalities and manual work as indicated    [place] and Agree with Plan of Care Patient           Patient will benefit from skilled therapeutic intervention in order to improve the following deficits and impairments:     Visit Diagnosis: Acute pain of right knee  Muscle weakness (generalized)  Other symptoms and signs involving the musculoskeletal system  Abnormal gait     Problem List Patient Active Problem List   Diagnosis Date Noted  . OA (osteoarthritis) of knee 07/21/2019  . Hammer toe of left foot 07/23/2018  . Epigastric pain 07/23/2018  . Onychomycosis 07/23/2018  . Bilateral primary osteoarthritis of knee 03/05/2018  . Gout 03/05/2018  . Chronic pain of both knees 03/05/2018  . Status post laparoscopic sleeve gastrectomy 03/04/2018  . Obesity 05/23/2010  . LEG CRAMPS 07/17/2009  . Constipation 06/21/2009  . FATTY LIVER DISEASE 09/12/2007  . Essential hypertension 05/16/2007  . ARTHRITIS 05/16/2007  . DIVERTICULOSIS, COLON 10/18/2006  . HYPERCHOLESTEROLEMIA 09/05/2006  . Factor V deficiency ([place]) 09/05/2006    Willella Harding Nilda Simmer PT, MPH  07/30/2019, 11:53 AM  [facility] [place], [place], [address] Phone: [phone]   Fax:  [phone]  Name: Amanda [name] MRN: [id number] Date of Birth: [birth year]

## 2019-07-31 MED FILL — LOSARTAN POTASSIUM 50 MG TA: 50 | 90 days supply | Qty: 180 | Fill #1

## 2019-08-04 ENCOUNTER — Ambulatory Visit (INDEPENDENT_AMBULATORY_CARE_PROVIDER_SITE_OTHER): Payer: No Typology Code available for payment source | Admitting: Physical Therapy

## 2019-08-04 ENCOUNTER — Other Ambulatory Visit: Payer: Self-pay

## 2019-08-04 DIAGNOSIS — M25561 Pain in right knee: Secondary | ICD-10-CM

## 2019-08-04 DIAGNOSIS — M6281 Muscle weakness (generalized): Secondary | ICD-10-CM | POA: Diagnosis not present

## 2019-08-04 DIAGNOSIS — R269 Unspecified abnormalities of gait and mobility: Secondary | ICD-10-CM | POA: Diagnosis not present

## 2019-08-04 DIAGNOSIS — R29898 Other symptoms and signs involving the musculoskeletal system: Secondary | ICD-10-CM | POA: Diagnosis not present

## 2019-08-04 NOTE — Therapy (Signed)
[place], [place], [address] Phone: [phone]   Fax:  [phone]  Physical Therapy Treatment  Patient Details  Name: Amanda [name] MRN: [id number] Date of Birth: [birth year] Referring Provider (PT): Dr Gaynelle Arabian   Encounter Date: 08/04/2019   PT End of Session - 08/04/19 0925    Visit Number 4    Number of Visits 24    Date for PT Re-Evaluation 10/16/19    PT Start Time 0849    PT Stop Time 0933    PT Time Calculation (min) 44 min    Activity Tolerance Patient tolerated treatment well           Past Medical History:  Diagnosis Date  . Allergy    seasonal  . Arthritis    all over  . Blood dyscrasia    Carrys trait for Leiden Factor five never had any issues . Yhe only reason she was tested was because her mother had it  . CPAP (continuous positive airway pressure) dependence    8 cm water w/ heated humidifier  . Diabetes mellitus without complication ([place])    Pre surgery - Gastric sx 2014 Roun-Y no diabetes since  no meds  . Fatty liver 4-08   by [place]  . Gout   . Hip pain   . Hypertension   . Knee pain   . Obesity   . Pneumonia   . Recurrent UTI   . Sleep apnea    had roux en y lost 140lbs no longer needs cpap    Past Surgical History:  Procedure Laterality Date  . APPENDECTOMY  1977  . [place]   one  . CHOLECYSTECTOMY  1977  . [place]  . reduction mammoplasty  1985  . ROUX-EN-Y GASTRIC BYPASS  2014  . TOTAL KNEE ARTHROPLASTY Right 07/21/2019   Procedure: TOTAL KNEE ARTHROPLASTY;  Surgeon: Gaynelle Arabian, MD;  Location: [facility];  Service: Orthopedics;  Laterality: Right;  62min    There were no vitals filed for this visit.   Subjective Assessment - 08/04/19 0853    Subjective "I think I took my pain medicine too late this morning". Pt took medicine 30 min prior to arrival. Pt reports she has been working hard on  her exercises this weekend.    Patient Stated Goals rehab the knee and return to normal functional and work activities    Currently in Pain? Yes    Pain Score 3     Pain Location Knee    Pain Orientation Right    Pain Descriptors / Indicators Aching;Dull    Aggravating Factors  sit too long    Pain Relieving Factors ice, medication,              [facility] PT Assessment - 08/04/19 0001      Assessment   Medical Diagnosis Rt TKA    Referring Provider (PT) Dr Gaynelle Arabian    Onset Date/Surgical Date 07/21/19   arthritic changes x several years    Hand Dominance Right    Next MD Visit 08/05/19    Prior Therapy here for hip pain       AROM   Right Knee Extension -17    Right Knee Flexion 105            [facility] Adult PT Treatment/Exercise - 08/04/19 0001      Knee/Hip Exercises: Stretches   Passive  Hamstring Stretch Right;3 reps   3 reps in sitting with overpressure from pt     Knee/Hip Exercises: Aerobic   Recumbent Bike partial to slow full revolutions x 5 min for ROM      Knee/Hip Exercises: Supine   Quad Sets AROM;Strengthening;Right;10 reps    Short Arc Quad Sets AROM;Strengthening;Right;15 reps   5 sec hold    Bridges 10 reps      Knee/Hip Exercises: Prone   Hamstring Curl 1 set;10 reps   in between prone hang sets   Prone Knee Hang 1 minute   2 reps   Other Prone Exercises TKE RLE x 5 sec x 10 reps      Modalities   Modalities Vasopneumatic      Vasopneumatic   Number Minutes Vasopneumatic  10 minutes    Vasopnuematic Location  Knee   Rt    Vasopneumatic Pressure Low    Vasopneumatic Temperature  34 deg      Manual Therapy   Passive ROM PTA assist pushing Rt knee into extension 10 sec hold x 4 reps                     PT Short Term Goals - 07/24/19 1209      PT SHORT TERM GOAL #1   Title Patient to demonstrate improved gait pattern with walker progressing to cane as tolerated    Time 6    Period Weeks    Status New    Target Date 09/04/19       PT SHORT TERM GOAL #2   Title Increase AROM Rt knee to 110 deg flexion and (-) 5 deg extension or better    Time 6    Period Weeks    Status New    Target Date 09/04/19      PT SHORT TERM GOAL #3   Title Independent in initial HEP    Time 6    Period Weeks    Status New    Target Date 09/04/19             PT Long Term Goals - 07/24/19 1211      PT LONG TERM GOAL #1   Title Improve Rt knee extension to (-) 3 to 0 degrees and knee flexion to 120 deg    Time 12    Period Weeks    Status New    Target Date 10/16/19      PT LONG TERM GOAL #2   Title Increase strength bilat hips to 5/5 in hip extension and abduction    Time 12    Period Weeks    Status New    Target Date 10/16/19      PT LONG TERM GOAL #3   Title Independent in ambulation for community distances with no assistive device    Time 12    Period Weeks    Status New    Target Date 10/16/19      PT LONG TERM GOAL #4   Time 12    Period Weeks    Status New    Target Date 10/16/19      PT LONG TERM GOAL #5   Title Improve FOTO to </= 55% limitation    Time 12    Period Weeks    Status New    Target Date 10/16/19                 Plan - 08/04/19 1329  Clinical Impression Statement Pt able to complete revolutions on bicycle today.  She has limited Rt knee ext ROM; continued encouragement to work on ROM exercises at home with emphasis on extension.  Pt progressing gradually towards goals.    PT Frequency 2x / week    PT Duration 12 weeks    PT Treatment/Interventions Patient/family education;ADLs/Self Care Home Management;Cryotherapy;Electrical Stimulation;Iontophoresis 4mg /ml Dexamethasone;Moist Heat;Ultrasound;Gait training;Stair training;Functional mobility training;Therapeutic activities;Therapeutic exercise;Balance training;Neuromuscular re-education;Manual techniques;Passive range of motion;Dry needling;Taping    PT Next Visit Plan Review and progress HEP; work on ROM with focus on  gaining full knee extension; passive stretch into knee extension; strengthening; gait training; modalities and manual work as indicated    [place] and Agree with Plan of Care Patient           Patient will benefit from skilled therapeutic intervention in order to improve the following deficits and impairments:     Visit Diagnosis: Acute pain of right knee  Muscle weakness (generalized)  Other symptoms and signs involving the musculoskeletal system  Abnormal gait     Problem List Patient Active Problem List   Diagnosis Date Noted  . OA (osteoarthritis) of knee 07/21/2019  . Hammer toe of left foot 07/23/2018  . Epigastric pain 07/23/2018  . Onychomycosis 07/23/2018  . Bilateral primary osteoarthritis of knee 03/05/2018  . Gout 03/05/2018  . Chronic pain of both knees 03/05/2018  . Status post laparoscopic sleeve gastrectomy 03/04/2018  . Obesity 05/23/2010  . LEG CRAMPS 07/17/2009  . Constipation 06/21/2009  . FATTY LIVER DISEASE 09/12/2007  . Essential hypertension 05/16/2007  . ARTHRITIS 05/16/2007  . DIVERTICULOSIS, COLON 10/18/2006  . HYPERCHOLESTEROLEMIA 09/05/2006  . Factor V deficiency ([place]) 09/05/2006   Kerin Perna, PTA 08/04/19 1:31 PM  [facility] [place], [place], [address] Phone: [phone]   Fax:  [phone]  Name: Amanda [name] MRN: [id number] Date of Birth: 12-Mar-1955

## 2019-08-06 ENCOUNTER — Other Ambulatory Visit: Payer: Self-pay

## 2019-08-06 ENCOUNTER — Encounter: Payer: Self-pay | Admitting: Physical Therapy

## 2019-08-06 ENCOUNTER — Ambulatory Visit (INDEPENDENT_AMBULATORY_CARE_PROVIDER_SITE_OTHER): Payer: No Typology Code available for payment source | Admitting: Physical Therapy

## 2019-08-06 VITALS — BP 91/60 | HR 89

## 2019-08-06 DIAGNOSIS — M25561 Pain in right knee: Secondary | ICD-10-CM

## 2019-08-06 DIAGNOSIS — M6281 Muscle weakness (generalized): Secondary | ICD-10-CM | POA: Diagnosis not present

## 2019-08-06 DIAGNOSIS — R29898 Other symptoms and signs involving the musculoskeletal system: Secondary | ICD-10-CM | POA: Diagnosis not present

## 2019-08-06 DIAGNOSIS — R269 Unspecified abnormalities of gait and mobility: Secondary | ICD-10-CM

## 2019-08-06 NOTE — Therapy (Signed)
[place], [place], [address] Phone: [phone]   Fax:  [phone]  Physical Therapy Treatment  Patient Details  Name: Amanda [name] MRN: [id number] Date of Birth: 01/16/1956 Referring Provider (PT): Dr Gaynelle Arabian   Encounter Date: 08/06/2019   PT End of Session - 08/06/19 0940    Visit Number 5    Number of Visits 24    Date for PT Re-Evaluation 10/16/19    PT Start Time 0930    PT Stop Time 1019    PT Time Calculation (min) 49 min    Activity Tolerance Patient tolerated treatment well    Behavior During Therapy [facility] for tasks assessed/performed           Past Medical History:  Diagnosis Date   Allergy    seasonal   Arthritis    all over   Blood dyscrasia    Carrys trait for Leiden Factor five never had any issues . Yhe only reason she was tested was because her mother had it   CPAP (continuous positive airway pressure) dependence    8 cm water w/ heated humidifier   Diabetes mellitus without complication ([place])    Pre surgery - Gastric sx 2014 Roun-Y no diabetes since  no meds   Fatty liver 4-08   by [place]   Gout    Hip pain    Hypertension    Knee pain    Obesity    Pneumonia    Recurrent UTI    Sleep apnea    had roux en y lost 140lbs no longer needs cpap    Past Surgical History:  Procedure Laterality Date   [place]   one   [place]   reduction mammoplasty  [place] GASTRIC BYPASS  2014   TOTAL KNEE ARTHROPLASTY Right 07/21/2019   Procedure: TOTAL KNEE ARTHROPLASTY;  Surgeon: Gaynelle Arabian, MD;  Location: [facility];  Service: Orthopedics;  Laterality: Right;  60min    Vitals:   08/06/19 0940 08/06/19 0958  BP: (!) 83/54 91/60  Pulse: 84 89     Subjective Assessment - 08/06/19 0940    Subjective Pt reports the exercise added to HEP  (prone hang) made her Rt knee sore.  She states she put her husband's boot on and let leg hang for 6 min. "I still haven't recovered from that".    Pertinent History arthritis; Fx Lt patella 2018; LBP and Rt hip pain; HTN    Patient Stated Goals rehab the knee and return to normal functional and work activities    Currently in Pain? Yes    Pain Score 5     Pain Location Knee    Pain Orientation Right    Pain Descriptors / Indicators [name] Assessment - 08/06/19 0001      Assessment   Medical Diagnosis Rt TKA    Referring Provider (PT) Dr Gaynelle Arabian    Onset Date/Surgical Date 07/21/19   arthritic changes x several years    Hand Dominance Right    Next MD Visit 08/26/19    Prior Therapy here for hip pain             [facility] Adult PT Treatment/Exercise - 08/06/19 0001      Knee/Hip  Exercises: Stretches   Passive Hamstring Stretch Right;1 rep;20 seconds      Knee/Hip Exercises: Aerobic   Recumbent Bike full slow revolutions x 5.5 min for ROM      Knee/Hip Exercises: Standing   Heel Raises Both;1 set;10 reps    Functional Squat 1 set;10 reps   mini, at sink   Other Standing Knee Exercises retro gait at counter (10ft) x 6 rep with unilateral support and cues for sequence      Knee/Hip Exercises: Seated   Sit to Sand 5 reps;without UE support   slowly lowering to table, RW in front     Knee/Hip Exercises: Supine   Short Arc Quad Sets AROM;Right;1 set;5 reps    Heel Slides AROM;Right;1 set;5 reps      Knee/Hip Exercises: Prone   Other Prone Exercises TKE RLE x 5 sec x 10 reps      Vasopneumatic   Number Minutes Vasopneumatic  10 minutes    Vasopnuematic Location  Knee   Rt    Vasopneumatic Pressure Medium    Vasopneumatic Temperature  34 deg                    PT Short Term Goals - 07/24/19 1209      PT SHORT TERM GOAL #1   Title Patient to demonstrate improved gait pattern with walker progressing to cane as tolerated    Time 6     Period Weeks    Status New    Target Date 09/04/19      PT SHORT TERM GOAL #2   Title Increase AROM Rt knee to 110 deg flexion and (-) 5 deg extension or better    Time 6    Period Weeks    Status New    Target Date 09/04/19      PT SHORT TERM GOAL #3   Title Independent in initial HEP    Time 6    Period Weeks    Status New    Target Date 09/04/19             PT Long Term Goals - 07/24/19 1211      PT LONG TERM GOAL #1   Title Improve Rt knee extension to (-) 3 to 0 degrees and knee flexion to 120 deg    Time 12    Period Weeks    Status New    Target Date 10/16/19      PT LONG TERM GOAL #2   Title Increase strength bilat hips to 5/5 in hip extension and abduction    Time 12    Period Weeks    Status New    Target Date 10/16/19      PT LONG TERM GOAL #3   Title Independent in ambulation for community distances with no assistive device    Time 12    Period Weeks    Status New    Target Date 10/16/19      PT LONG TERM GOAL #4   Time 12    Period Weeks    Status New    Target Date 10/16/19      PT LONG TERM GOAL #5   Title Improve FOTO to </= 55% limitation    Time 12    Period Weeks    Status New    Target Date 10/16/19                 Plan - 08/06/19 1001  Clinical Impression Statement Pt had one episode of light headed feeling when perfoming standing TKE against wall. Pt seated and BP checked; low readings (normally 120/70 per pt).  Rt knee extension continues to be very limited; encouraged pt to limit prone hang time until she is more accustomed to stretch.  Goals are ongoing.    PT Frequency 2x / week    PT Duration 12 weeks    PT Treatment/Interventions Patient/family education;ADLs/Self Care Home Management;Cryotherapy;Electrical Stimulation;Iontophoresis 4mg /ml Dexamethasone;Moist Heat;Ultrasound;Gait training;Stair training;Functional mobility training;Therapeutic activities;Therapeutic exercise;Balance training;Neuromuscular  re-education;Manual techniques;Passive range of motion;Dry needling;Taping    PT Next Visit Plan work on ROM with focus on gaining full knee extension; passive stretch into knee extension; strengthening; gait training; modalities and manual work as indicated    PT [place] and Agree with Plan of Care Patient           Patient will benefit from skilled therapeutic intervention in order to improve the following deficits and impairments:  Decreased range of motion, Difficulty walking, Abnormal gait, Pain, Decreased activity tolerance, Decreased balance, Impaired flexibility, Decreased mobility, Decreased strength, Increased edema  Visit Diagnosis: Acute pain of right knee  Muscle weakness (generalized)  Other symptoms and signs involving the musculoskeletal system  Abnormal gait     Problem List Patient Active Problem List   Diagnosis Date Noted   OA (osteoarthritis) of knee 07/21/2019   Hammer toe of left foot 07/23/2018   Epigastric pain 07/23/2018   Onychomycosis 07/23/2018   Bilateral primary osteoarthritis of knee 03/05/2018   Gout 03/05/2018   Chronic pain of both knees 03/05/2018   Status post laparoscopic sleeve gastrectomy 03/04/2018   Obesity 05/23/2010   LEG CRAMPS 07/17/2009   Constipation 06/21/2009   FATTY LIVER DISEASE 09/12/2007   Essential hypertension 05/16/2007   ARTHRITIS 05/16/2007   DIVERTICULOSIS, COLON 10/18/2006   HYPERCHOLESTEROLEMIA 09/05/2006   Factor V deficiency ([place]) 09/05/2006   Kerin Perna, PTA 08/06/19 10:16 AM  [place], [place], [address] Phone: [phone]   Fax:  [phone]  Name: Amanda [name] MRN: [id number] Date of Birth: 1955/05/29

## 2019-08-12 ENCOUNTER — Ambulatory Visit (INDEPENDENT_AMBULATORY_CARE_PROVIDER_SITE_OTHER): Payer: No Typology Code available for payment source | Admitting: Physical Therapy

## 2019-08-12 ENCOUNTER — Other Ambulatory Visit: Payer: Self-pay

## 2019-08-12 VITALS — BP 112/86 | HR 84

## 2019-08-12 DIAGNOSIS — M25561 Pain in right knee: Secondary | ICD-10-CM

## 2019-08-12 DIAGNOSIS — R29898 Other symptoms and signs involving the musculoskeletal system: Secondary | ICD-10-CM

## 2019-08-12 DIAGNOSIS — M6281 Muscle weakness (generalized): Secondary | ICD-10-CM | POA: Diagnosis not present

## 2019-08-12 NOTE — Therapy (Signed)
[facility] [place], [place], [address] Phone: [phone]   Fax:  [phone]  Physical Therapy Treatment  Patient Details  Name: Amanda [name] MRN: [id number] Date of Birth: 02-13-1955 Referring Provider (PT): Dr Gaynelle Arabian   Encounter Date: 08/12/2019   PT End of Session - 08/12/19 1047    Visit Number 6    Number of Visits 24    Date for PT Re-Evaluation 10/16/19    PT Start Time 1016    PT Stop Time 1100    PT Time Calculation (min) 44 min    Activity Tolerance Patient tolerated treatment well;Treatment limited secondary to medical complications (Comment)    Behavior During Therapy [facility] for tasks assessed/performed           Past Medical History:  Diagnosis Date  . Allergy    seasonal  . Arthritis    all over  . Blood dyscrasia    Carrys trait for Leiden Factor five never had any issues . Yhe only reason she was tested was because her mother had it  . CPAP (continuous positive airway pressure) dependence    8 cm water w/ heated humidifier  . Diabetes mellitus without complication ([place])    Pre surgery - Gastric sx 2014 Roun-Y no diabetes since  no meds  . Fatty liver 4-08   by [place]  . Gout   . Hip pain   . Hypertension   . Knee pain   . Obesity   . Pneumonia   . Recurrent UTI   . Sleep apnea    had roux en y lost 140lbs no longer needs cpap    Past Surgical History:  Procedure Laterality Date  . APPENDECTOMY  1977  . [place]   one  . CHOLECYSTECTOMY  1977  . [place]  . reduction mammoplasty  1985  . ROUX-EN-Y GASTRIC BYPASS  2014  . TOTAL KNEE ARTHROPLASTY Right 07/21/2019   Procedure: TOTAL KNEE ARTHROPLASTY;  Surgeon: Gaynelle Arabian, MD;  Location: [facility];  Service: Orthopedics;  Laterality: Right;  40min    Vitals:   08/12/19 1020 08/12/19 1038 08/12/19 1045  BP: (!) 159/117  112/86  Pulse: (!) 136 (!) 102 84      Subjective Assessment - 08/12/19 1020    Subjective "I've been working hard on the extension".  Pt reports she did a lot of walking over the weekend as well.    Pertinent History arthritis; Fx Lt patella 2018; LBP and Rt hip pain; HTN    Patient Stated Goals rehab the knee and return to normal functional and work activities    Currently in Pain? Yes    Pain Score 1     Pain Location Knee    Pain Orientation Right    Pain Descriptors / Indicators Dull    Aggravating Factors  prone hang    Pain Relieving Factors ice, medication              [facility] PT Assessment - 08/12/19 0001      Assessment   Medical Diagnosis Rt TKA    Referring Provider (PT) Dr Gaynelle Arabian    Onset Date/Surgical Date 07/21/19   arthritic changes x several years    Hand Dominance Right    Next MD Visit 08/26/19    Prior Therapy here for hip pain       AROM   Right Knee Extension -  15    Right Knee Flexion 108            [facility] Adult PT Treatment/Exercise - 08/12/19 0001      Knee/Hip Exercises: Stretches   Gastroc Stretch Both;2 reps;20 seconds   incline board, L2     Knee/Hip Exercises: Aerobic   Recumbent Bike L1: 5 min (full revolutions)      Knee/Hip Exercises: Standing   Terminal Knee Extension Right;1 set;10 reps   ball behind knee, 5 sec hold   Forward Step Up Right;1 set;10 reps;Step Height: 6";Hand Hold: 2    Step Down Left;Step Height: 4";Hand Hold: 2   3 steps   Wall Squat 1 set;10 reps   mini squat     Vasopneumatic   Number Minutes Vasopneumatic  10 minutes    Vasopnuematic Location  Knee   Rt    Vasopneumatic Pressure Low    Vasopneumatic Temperature  34 deg      Manual Therapy   Manual therapy comments pt supine.     Passive ROM PTA assist pushing Rt knee into ext x 30 sec x 3 reps, and again 10 sec hold x 3.                     PT Short Term Goals - 07/24/19 1209      PT SHORT TERM GOAL #1   Title Patient to demonstrate improved gait pattern with walker  progressing to cane as tolerated    Time 6    Period Weeks    Status New    Target Date 09/04/19      PT SHORT TERM GOAL #2   Title Increase AROM Rt knee to 110 deg flexion and (-) 5 deg extension or better    Time 6    Period Weeks    Status New    Target Date 09/04/19      PT SHORT TERM GOAL #3   Title Independent in initial HEP    Time 6    Period Weeks    Status New    Target Date 09/04/19             PT Long Term Goals - 07/24/19 1211      PT LONG TERM GOAL #1   Title Improve Rt knee extension to (-) 3 to 0 degrees and knee flexion to 120 deg    Time 12    Period Weeks    Status New    Target Date 10/16/19      PT LONG TERM GOAL #2   Title Increase strength bilat hips to 5/5 in hip extension and abduction    Time 12    Period Weeks    Status New    Target Date 10/16/19      PT LONG TERM GOAL #3   Title Independent in ambulation for community distances with no assistive device    Time 12    Period Weeks    Status New    Target Date 10/16/19      PT LONG TERM GOAL #4   Time 12    Period Weeks    Status New    Target Date 10/16/19      PT LONG TERM GOAL #5   Title Improve FOTO to </= 55% limitation    Time 12    Period Weeks    Status New    Target Date 10/16/19  Plan - 08/12/19 1039    Clinical Impression Statement Pt able to turn bicycle on today. Pt required short seated rest breaks in between standing strengthening exercises (stairs) due to overheating and increased nausea. BP and heartrate fluctuated today during session; 159/117 to to 112/86 (both taken in seated position).   Progressing well towards goals.    Rehab Potential Good    PT Frequency 2x / week    PT Duration 12 weeks    PT Treatment/Interventions Patient/family education;ADLs/Self Care Home Management;Cryotherapy;Electrical Stimulation;Iontophoresis 4mg /ml Dexamethasone;Moist Heat;Ultrasound;Gait training;Stair training;Functional mobility  training;Therapeutic activities;Therapeutic exercise;Balance training;Neuromuscular re-education;Manual techniques;Passive range of motion;Dry needling;Taping    PT Next Visit Plan work on ROM with focus on gaining full knee extension; passive stretch into knee extension; strengthening; gait training; modalities and manual work as indicated - trial cane.   PT Home Exercise Plan [email or web address]    Consulted and Agree with Plan of Care Patient           Patient will benefit from skilled therapeutic intervention in order to improve the following deficits and impairments:  Decreased range of motion, Difficulty walking, Abnormal gait, Pain, Decreased activity tolerance, Decreased balance, Impaired flexibility, Decreased mobility, Decreased strength, Increased edema  Visit Diagnosis: Acute pain of right knee  Muscle weakness (generalized)  Other symptoms and signs involving the musculoskeletal system     Problem List Patient Active Problem List   Diagnosis Date Noted  . OA (osteoarthritis) of knee 07/21/2019  . Hammer toe of left foot 07/23/2018  . Epigastric pain 07/23/2018  . Onychomycosis 07/23/2018  . Bilateral primary osteoarthritis of knee 03/05/2018  . Gout 03/05/2018  . Chronic pain of both knees 03/05/2018  . Status post laparoscopic sleeve gastrectomy 03/04/2018  . Obesity 05/23/2010  . LEG CRAMPS 07/17/2009  . Constipation 06/21/2009  . FATTY LIVER DISEASE 09/12/2007  . Essential hypertension 05/16/2007  . ARTHRITIS 05/16/2007  . DIVERTICULOSIS, COLON 10/18/2006  . HYPERCHOLESTEROLEMIA 09/05/2006  . Factor V deficiency ([place]) 09/05/2006   Kerin Perna, PTA 08/12/19 11:29 AM  [address] [place], [place], [address] Phone: [phone]   Fax:  [phone]  Name: SHAWONDA KERCE MRN: [id number] Date of Birth: Jun 29, 1955

## 2019-08-14 ENCOUNTER — Encounter: Payer: No Typology Code available for payment source | Admitting: Physical Therapy

## 2019-08-18 ENCOUNTER — Ambulatory Visit (INDEPENDENT_AMBULATORY_CARE_PROVIDER_SITE_OTHER): Payer: No Typology Code available for payment source | Admitting: Physical Therapy

## 2019-08-18 ENCOUNTER — Encounter: Payer: Self-pay | Admitting: Physical Therapy

## 2019-08-18 ENCOUNTER — Other Ambulatory Visit: Payer: Self-pay

## 2019-08-18 VITALS — BP 133/79 | HR 74

## 2019-08-18 DIAGNOSIS — R29898 Other symptoms and signs involving the musculoskeletal system: Secondary | ICD-10-CM | POA: Diagnosis not present

## 2019-08-18 DIAGNOSIS — M6281 Muscle weakness (generalized): Secondary | ICD-10-CM | POA: Diagnosis not present

## 2019-08-18 DIAGNOSIS — R269 Unspecified abnormalities of gait and mobility: Secondary | ICD-10-CM | POA: Diagnosis not present

## 2019-08-18 DIAGNOSIS — M25561 Pain in right knee: Secondary | ICD-10-CM | POA: Diagnosis not present

## 2019-08-18 NOTE — Therapy (Signed)
[facility], [place], [address] Phone: [phone]   Fax:  [phone]  Physical Therapy Treatment  Patient Details  Name: Amanda [name] MRN: [id number] Date of Birth: 06/01/1955 Referring Provider (PT): Dr Gaynelle Arabian   Encounter Date: 08/18/2019   PT End of Session - 08/18/19 1143    Visit Number 7    Number of Visits 24    Date for PT Re-Evaluation 10/16/19    PT Start Time 1147    PT Stop Time 1235    PT Time Calculation (min) 48 min    Activity Tolerance Patient tolerated treatment well;Treatment limited secondary to medical complications (Comment)    Behavior During Therapy [facility] for tasks assessed/performed           Past Medical History:  Diagnosis Date  . Allergy    seasonal  . Arthritis    all over  . Blood dyscrasia    Carrys trait for Leiden Factor five never had any issues . Yhe only reason she was tested was because her mother had it  . CPAP (continuous positive airway pressure) dependence    8 cm water w/ heated humidifier  . Diabetes mellitus without complication ([place])    Pre surgery - Gastric sx 2014 Roun-Y no diabetes since  no meds  . Fatty liver 4-08   by [place]  . Gout   . Hip pain   . Hypertension   . Knee pain   . Obesity   . Pneumonia   . Recurrent UTI   . Sleep apnea    had roux en y lost 140lbs no longer needs cpap    Past Surgical History:  Procedure Laterality Date  . APPENDECTOMY  1977  . [employer]   one  . CHOLECYSTECTOMY  1977  . [place]  . reduction mammoplasty  1985  . ROUX-EN-Y GASTRIC BYPASS  2014  . TOTAL KNEE ARTHROPLASTY Right 07/21/2019   Procedure: TOTAL KNEE ARTHROPLASTY;  Surgeon: Gaynelle Arabian, MD;  Location: [facility];  Service: Orthopedics;  Laterality: Right;  59mn    Vitals:   08/18/19 1147  BP: 133/79  Pulse: 74     Subjective Assessment - 08/18/19 1206    Subjective Pt  has brought her cane with today.  Continues to ambulate with RW in community, and sometimes furniture walks around home.  She did not take one of BP meds and pain med this morning.    Pertinent History arthritis; Fx Lt patella 2018; LBP and Rt hip pain; HTN    Currently in Pain? Yes    Pain Score 2     Pain Location Knee    Pain Orientation Right    Pain Descriptors / Indicators Dull    Aggravating Factors  over stretching    Pain Relieving Factors ice, medication              [facility] PT Assessment - 08/18/19 0001      Assessment   Medical Diagnosis Rt TKA    Referring Provider (PT) Dr FGaynelle Arabian   Onset Date/Surgical Date 07/21/19   arthritic changes x several years    Hand Dominance Right    Next MD Visit 08/26/19    Prior Therapy here for hip pain       AROM   Right Knee Extension -13    Right Knee Flexion 116  [facility] Adult PT Treatment/Exercise - 08/18/19 0001      Ambulation/Gait   Ambulation Distance (Feet) 200 Feet    Assistive device Straight cane    Gait Pattern Step-through pattern;Decreased arm swing - right;Decreased stance time - right;Decreased weight shift to right;Right flexed knee in stance    Ambulation Surface Level;Indoor    Gait Comments cues for cane placement and weight shift Rt .       Knee/Hip Exercises: Stretches   Programmer, applications reps;20 seconds   runner's stretch     Knee/Hip Exercises: Aerobic   Recumbent Bike L1: 5 min (full revolutions)      Knee/Hip Exercises: Standing   Lateral Step Up Right;1 set;10 reps;Hand Hold: 2;Step Height: 6"    Forward Step Up Right;1 set;10 reps;Step Height: 6";Hand Hold: 2    Step Down Left;1 set;5 reps;Hand Hold: 2;Step Height: 4"    Stairs reciprocal pattern up/down 3 reps 4" step with BUE on rail.       Knee/Hip Exercises: Seated   Knee/Hip Flexion seated scoot forward to incresae knee flexion 10 sec x 5 reps     Sit to Sand 5 reps;with UE support      Knee/Hip Exercises: Supine    Short Arc Quad Sets Strengthening;Right;1 set;5 reps      Vasopneumatic   Number Minutes Vasopneumatic  10 minutes    Vasopnuematic Location  Knee   Rt    Vasopneumatic Pressure Medium    Vasopneumatic Temperature  34 deg      Manual Therapy   Manual therapy comments pt supine.     Passive ROM PTA assist pushing Rt knee into ext x 30 sec x 3 reps, and again 10 sec hold x 3.                     PT Short Term Goals - 08/18/19 1315      PT SHORT TERM GOAL #1   Title Patient to demonstrate improved gait pattern with walker progressing to cane as tolerated    Time 6    Period Weeks    Status Achieved    Target Date 09/04/19      PT SHORT TERM GOAL #2   Title Increase AROM Rt knee to 110 deg flexion and (-) 5 deg extension or better    Time 6    Period Weeks    Status Partially Met    Target Date 09/04/19      PT SHORT TERM GOAL #3   Title Independent in initial HEP    Time 6    Period Weeks    Status On-going    Target Date 09/04/19             PT Long Term Goals - 07/24/19 1211      PT LONG TERM GOAL #1   Title Improve Rt knee extension to (-) 3 to 0 degrees and knee flexion to 120 deg    Time 12    Period Weeks    Status New    Target Date 10/16/19      PT LONG TERM GOAL #2   Title Increase strength bilat hips to 5/5 in hip extension and abduction    Time 12    Period Weeks    Status New    Target Date 10/16/19      PT LONG TERM GOAL #3   Title Independent in ambulation for community distances with no assistive device  Time 12    Period Weeks    Status New    Target Date 10/16/19      PT LONG TERM GOAL #4   Time 12    Period Weeks    Status New    Target Date 10/16/19      PT LONG TERM GOAL #5   Title Improve FOTO to </= 55% limitation    Time 12    Period Weeks    Status New    Target Date 10/16/19                 Plan - 08/18/19 1248    Clinical Impression Statement Pt had no issues with BP or nausea this visit.  She  ambulated with [facility] with supervision. She tolerated exercises well without increase in pain.  Mild increase in pain with passive stretches into ext.  Rt knee ROM gradually improving.    Rehab Potential Good    PT Frequency 2x / week    PT Duration 12 weeks    PT Treatment/Interventions Patient/family education;ADLs/Self Care Home Management;Cryotherapy;Electrical Stimulation;Iontophoresis 22m/ml Dexamethasone;Moist Heat;Ultrasound;Gait training;Stair training;Functional mobility training;Therapeutic activities;Therapeutic exercise;Balance training;Neuromuscular re-education;Manual techniques;Passive range of motion;Dry needling;Taping    PT Next Visit Plan work on ROM with focus on gaining full knee extension; passive stretch into knee extension; strengthening; gait training; modalities and manual work as indicated    PT [place] Agree with Plan of Care Patient           Patient will benefit from skilled therapeutic intervention in order to improve the following deficits and impairments:  Decreased range of motion, Difficulty walking, Abnormal gait, Pain, Decreased activity tolerance, Decreased balance, Impaired flexibility, Decreased mobility, Decreased strength, Increased edema  Visit Diagnosis: Acute pain of right knee  Muscle weakness (generalized)  Other symptoms and signs involving the musculoskeletal system  Abnormal gait     Problem List Patient Active Problem List   Diagnosis Date Noted  . OA (osteoarthritis) of knee 07/21/2019  . Hammer toe of left foot 07/23/2018  . Epigastric pain 07/23/2018  . Onychomycosis 07/23/2018  . Bilateral primary osteoarthritis of knee 03/05/2018  . Gout 03/05/2018  . Chronic pain of both knees 03/05/2018  . Status post laparoscopic sleeve gastrectomy 03/04/2018  . Obesity 05/23/2010  . LEG CRAMPS 07/17/2009  . Constipation 06/21/2009  . FATTY LIVER DISEASE 09/12/2007  . Essential hypertension  05/16/2007  . ARTHRITIS 05/16/2007  . DIVERTICULOSIS, COLON 10/18/2006  . HYPERCHOLESTEROLEMIA 09/05/2006  . Factor V deficiency ([place] 09/05/2006   JKerin Perna PTA 08/18/19 1:15 PM  [facility] [address] [address]Phone: [phone]  Fax:  [phone] Name: Amanda VILLASENORMRN: [id number] of Birth: [birth year]

## 2019-08-20 ENCOUNTER — Other Ambulatory Visit: Payer: Self-pay

## 2019-08-20 ENCOUNTER — Ambulatory Visit (INDEPENDENT_AMBULATORY_CARE_PROVIDER_SITE_OTHER): Payer: No Typology Code available for payment source | Admitting: Rehabilitative and Restorative Service Providers"

## 2019-08-20 ENCOUNTER — Encounter: Payer: Self-pay | Admitting: Rehabilitative and Restorative Service Providers"

## 2019-08-20 DIAGNOSIS — M6281 Muscle weakness (generalized): Secondary | ICD-10-CM

## 2019-08-20 DIAGNOSIS — M25561 Pain in right knee: Secondary | ICD-10-CM | POA: Diagnosis not present

## 2019-08-20 DIAGNOSIS — R29898 Other symptoms and signs involving the musculoskeletal system: Secondary | ICD-10-CM | POA: Diagnosis not present

## 2019-08-20 DIAGNOSIS — R269 Unspecified abnormalities of gait and mobility: Secondary | ICD-10-CM

## 2019-08-20 NOTE — Patient Instructions (Signed)
Access Code: [email or web address]: [email or web address]: 07/14/2021Prepared by: Silvia Hightower HoltExercises  Supine Quad Set - 2 x daily - 7 x weekly - 1 sets - 10 reps - 5-10 sec hold  Small Range Straight Leg Raise - 2 x daily - 7 x weekly - 1 sets - 10 reps - 3-5sec hold  Supine Knee Flexion Self Mobilization - 2 x daily - 7 x weekly - 1 sets - 10 reps - 10 sec hold  Seated Knee Flexion Extension AROM - 2 x daily - 7 x weekly - 1 sets - 10 reps - 10 sec hold  Supine Knee Extension Strengthening - 2 x daily - 7 x weekly - 1 sets - 5 reps - 10 sec hold  Standing Terminal Knee Extension with Resistance - 2 x daily - 7 x weekly - 1 sets - 5 reps - 10 sec hold  Standing Terminal Knee Extension at Wall with Ball - 2 x daily - 7 x weekly - 1 sets - 5 reps - 10 sec hold

## 2019-08-20 NOTE — Therapy (Signed)
[place], [place], [address] Phone: [phone]   Fax:  [phone]  Physical Therapy Treatment  Patient Details  Name: Amanda [name] MRN: [id number] Date of Birth: 02/04/1956 Referring Provider (PT): Dr Gaynelle Arabian   Encounter Date: 08/20/2019   PT End of Session - 08/20/19 1439    Visit Number 8    Number of Visits 24    Date for PT Re-Evaluation 10/16/19    PT Start Time 1430    PT Stop Time 1520    PT Time Calculation (min) 50 min    Activity Tolerance Patient tolerated treatment well;Treatment limited secondary to medical complications (Comment)           Past Medical History:  Diagnosis Date  . Allergy    seasonal  . Arthritis    all over  . Blood dyscrasia    Carrys trait for Leiden Factor five never had any issues . Yhe only reason she was tested was because her mother had it  . CPAP (continuous positive airway pressure) dependence    8 cm water w/ heated humidifier  . Diabetes mellitus without complication ([place])    Pre surgery - Gastric sx 2014 Roun-Y no diabetes since  no meds  . Fatty liver 4-08   by [place]  . Gout   . Hip pain   . Hypertension   . Knee pain   . Obesity   . Pneumonia   . Recurrent UTI   . Sleep apnea    had roux en y lost 140lbs no longer needs cpap    Past Surgical History:  Procedure Laterality Date  . APPENDECTOMY  1977  . [place]   one  . CHOLECYSTECTOMY  1977  . [place]  . reduction mammoplasty  1985  . ROUX-EN-Y GASTRIC BYPASS  2014  . TOTAL KNEE ARTHROPLASTY Right 07/21/2019   Procedure: TOTAL KNEE ARTHROPLASTY;  Surgeon: Gaynelle Arabian, MD;  Location: [facility];  Service: Orthopedics;  Laterality: Right;  43mn    There were no vitals filed for this visit.   Subjective Assessment - 08/20/19 1440    Subjective Patient is walking with cane and doing well. Exercising at home. [place] in from the parking lot.    Currently in Pain? No/denies    Pain Score 0-No pain                             [facility] Adult PT Treatment/Exercise - 08/20/19 0001      Ambulation/Gait   Ambulation Distance (Feet) 240 Feet    Assistive device Straight cane    Gait Pattern Step-through pattern;Decreased arm swing - right;Decreased stance time - right;Decreased weight shift to right;Right flexed knee in stance    Ambulation Surface Level;Indoor      Knee/Hip Exercises: Stretches   Passive Hamstring Stretch Right;3 reps;30 seconds   sitting hinged hip pressure down above knee    Passive Hamstring Stretch Limitations Rt 30 sec x 3 reps supine with strap     Other Knee/Hip Stretches prolonged stretch into knee flexion sitting w/ foot on stool 5 # wt above knee to assist with flexion       Knee/Hip Exercises: Aerobic   Recumbent Bike L2 x 7 min       Knee/Hip Exercises: Standing   Gait  Training added 3/8 in lift to Lt shoe to adjust for leg length difference - continued work on gait with SPC with improved gait pattern noted     Other Standing Knee Exercises TKE standing back to wall extending against black TB 5 sec hold x 5 reps; pressing into ball on wall 5 sec hold x 5 reps       Knee/Hip Exercises: Supine   Quad Sets AROM;Strengthening;Right;10 reps   5 sec hold    Short Arc Quad Sets Strengthening;Right;10 reps   5 sec hold    Straight Leg Raises Strengthening;Right;2 sets;5 reps   5 sec hold      Vasopneumatic   Number Minutes Vasopneumatic  10 minutes    Vasopnuematic Location  Knee   Rt    Vasopneumatic Pressure Medium    Vasopneumatic Temperature  34 deg      Manual Therapy   Manual therapy comments pt supine.     Passive ROM PT assist pushing Rt knee into ext x 10 sec x 3 reps, and again 10 sec hold x 3.                   PT Education - 08/20/19 1519    Education Details HEP    Person(s) Educated Patient     Methods Explanation;Demonstration;Tactile cues;Verbal cues;Handout    Comprehension Verbalized understanding;Returned demonstration;Verbal cues required;Tactile cues required            PT Short Term Goals - 08/18/19 1315      PT SHORT TERM GOAL #1   Title Patient to demonstrate improved gait pattern with walker progressing to cane as tolerated    Time 6    Period Weeks    Status Achieved    Target Date 09/04/19      PT SHORT TERM GOAL #2   Title Increase AROM Rt knee to 110 deg flexion and (-) 5 deg extension or better    Time 6    Period Weeks    Status Partially Met    Target Date 09/04/19      PT SHORT TERM GOAL #3   Title Independent in initial HEP    Time 6    Period Weeks    Status On-going    Target Date 09/04/19             PT Long Term Goals - 07/24/19 1211      PT LONG TERM GOAL #1   Title Improve Rt knee extension to (-) 3 to 0 degrees and knee flexion to 120 deg    Time 12    Period Weeks    Status New    Target Date 10/16/19      PT LONG TERM GOAL #2   Title Increase strength bilat hips to 5/5 in hip extension and abduction    Time 12    Period Weeks    Status New    Target Date 10/16/19      PT LONG TERM GOAL #3   Title Independent in ambulation for community distances with no assistive device    Time 12    Period Weeks    Status New    Target Date 10/16/19      PT LONG TERM GOAL #4   Time 12    Period Weeks    Status New    Target Date 10/16/19      PT LONG TERM GOAL #5   Title Improve FOTO to </= 55% limitation  Time 12    Period Weeks    Status New    Target Date 10/16/19                 Plan - 08/20/19 1443    Clinical Impression Statement Patient reports that she is doing well. She has progressed to a cane for ambulation and went back to social event outside the home last night and did well. Continued work on stretching; strengthening; gait. Noted functional leg length difference with Lt LE shorter than Rt - added  3/8 inch heel lift Lt shoe with improvement in gait reported and observed.  Progressing gradually with Rt TKA rehab    Rehab Potential Good    PT Frequency 2x / week    PT Duration 12 weeks    PT Treatment/Interventions Patient/family education;ADLs/Self Care Home Management;Cryotherapy;Electrical Stimulation;Iontophoresis 75m/ml Dexamethasone;Moist Heat;Ultrasound;Gait training;Stair training;Functional mobility training;Therapeutic activities;Therapeutic exercise;Balance training;Neuromuscular re-education;Manual techniques;Passive range of motion;Dry needling;Taping    PT Next Visit Plan work on ROM with focus on gaining full knee extension; passive stretch into knee extension; strengthening; gait training; modalities and manual work as indicated    PT [place]and Agree with Plan of Care Patient           Patient will benefit from skilled therapeutic intervention in order to improve the following deficits and impairments:     Visit Diagnosis: Acute pain of right knee  Muscle weakness (generalized)  Other symptoms and signs involving the musculoskeletal system  Abnormal gait     Problem List Patient Active Problem List   Diagnosis Date Noted  . OA (osteoarthritis) of knee 07/21/2019  . Hammer toe of left foot 07/23/2018  . Epigastric pain 07/23/2018  . Onychomycosis 07/23/2018  . Bilateral primary osteoarthritis of knee 03/05/2018  . Gout 03/05/2018  . Chronic pain of both knees 03/05/2018  . Status post laparoscopic sleeve gastrectomy 03/04/2018  . Obesity 05/23/2010  . LEG CRAMPS 07/17/2009  . Constipation 06/21/2009  . FATTY LIVER DISEASE 09/12/2007  . Essential hypertension 05/16/2007  . ARTHRITIS 05/16/2007  . DIVERTICULOSIS, COLON 10/18/2006  . HYPERCHOLESTEROLEMIA 09/05/2006  . Factor V deficiency ([place] 09/05/2006    Cozy Veale P Holtn PT, MPH  08/20/2019, 3:27 PM  [facility] [address]Phone: [phone]  Fax:  [phone] Name: Amanda RYEMRN: [id number] of Birth: 112/22/1957

## 2019-08-25 ENCOUNTER — Ambulatory Visit (INDEPENDENT_AMBULATORY_CARE_PROVIDER_SITE_OTHER): Payer: No Typology Code available for payment source | Admitting: Physical Therapy

## 2019-08-25 DIAGNOSIS — M6281 Muscle weakness (generalized): Secondary | ICD-10-CM | POA: Diagnosis not present

## 2019-08-25 DIAGNOSIS — M25561 Pain in right knee: Secondary | ICD-10-CM | POA: Diagnosis not present

## 2019-08-25 DIAGNOSIS — R29898 Other symptoms and signs involving the musculoskeletal system: Secondary | ICD-10-CM | POA: Diagnosis not present

## 2019-08-25 DIAGNOSIS — R269 Unspecified abnormalities of gait and mobility: Secondary | ICD-10-CM

## 2019-08-25 NOTE — Therapy (Signed)
[place], [place], [address] Phone: [phone]   Fax:  [phone]  Physical Therapy Treatment  Patient Details  Name: Amanda [name] MRN: [id number] Date of Birth: 08-18-1955 Referring Provider (PT): Dr Gaynelle Arabian   Encounter Date: 08/25/2019   PT End of Session - 08/25/19 1156    Visit Number 9    Number of Visits 24    Date for PT Re-Evaluation 10/16/19    PT Start Time 1148    PT Stop Time 1233    PT Time Calculation (min) 45 min    Activity Tolerance Patient tolerated treatment well           Past Medical History:  Diagnosis Date  . Allergy    seasonal  . Arthritis    all over  . Blood dyscrasia    Carrys trait for Leiden Factor five never had any issues . Yhe only reason she was tested was because her mother had it  . CPAP (continuous positive airway pressure) dependence    8 cm water w/ heated humidifier  . Diabetes mellitus without complication ([place])    Pre surgery - Gastric sx 2014 Roun-Y no diabetes since  no meds  . Fatty liver 4-08   by [place]  . Gout   . Hip pain   . Hypertension   . Knee pain   . Obesity   . Pneumonia   . Recurrent UTI   . Sleep apnea    had roux en y lost 140lbs no longer needs cpap    Past Surgical History:  Procedure Laterality Date  . APPENDECTOMY  1977  . [place]   one  . CHOLECYSTECTOMY  1977  . [place]  . reduction mammoplasty  1985  . ROUX-EN-Y GASTRIC BYPASS  2014  . TOTAL KNEE ARTHROPLASTY Right 07/21/2019   Procedure: TOTAL KNEE ARTHROPLASTY;  Surgeon: Gaynelle Arabian, MD;  Location: [facility];  Service: Orthopedics;  Laterality: Right;  55mn    There were no vitals filed for this visit.   Subjective Assessment - 08/25/19 1157    Subjective Pt reports she is been in a great deal of pain since last session, "it was too much".  She is having difficulty getting comfortable during  day and night. Keeping ice on regularly.    Patient Stated Goals rehab the knee and return to normal functional and work activities    Currently in Pain? Yes    Pain Score 5     Pain Location Knee    Pain Orientation Right    Pain Descriptors / Indicators Sore    Aggravating Factors  over stretching    Pain Relieving Factors ice, medication              [facility] PT Assessment - 08/25/19 0001      Assessment   Medical Diagnosis Rt TKA    Referring Provider (PT) Dr FGaynelle Arabian   Onset Date/Surgical Date 07/21/19   arthritic changes x several years    Hand Dominance Right    Next MD Visit 08/26/19    Prior Therapy here for hip pain       Observation/Other Assessments   Focus on Therapeutic Outcomes (FOTO)  48% limitation      AROM   Right Knee Extension -12    Right Knee Flexion 112  [place] Adult PT Treatment/Exercise - 08/25/19 0001      Knee/Hip Exercises: Stretches   Passive Hamstring Stretch Right;20 seconds;4 reps   with overpressure into knee ext   Hip Flexor Stretch Right;3 reps;30 seconds   Thomas position   [employer] Both;2 reps;30 seconds    Other Knee/Hip Stretches seated scoot x 10 sec x 5 reps (to increase Rt knee flexion); Rt forward lunge with foot on 12" step x 20 sec hold.       Knee/Hip Exercises: Aerobic   Recumbent Bike L1 x 5.5 min       Knee/Hip Exercises: Standing   Lateral Step Up Right;1 set;Step Height: 4"   6 reps, with use of cane   Forward Step Up Right;10 reps;Hand Hold: 2;Step Height: 4";5 reps;Step Height: 6"    Step Down Left;1 set;10 reps;Hand Hold: 2;Step Height: 4"      Knee/Hip Exercises: Supine   Quad Sets Right;1 set;5 reps      Modalities   Modalities Psychologist, educational Location Rt ant knee    Electrical Stimulation Action IFC    Electrical Stimulation Parameters intensity to tolerance, during vaso    Electrical Stimulation Goals Pain        Vasopneumatic   Number Minutes Vasopneumatic  10 minutes    Vasopnuematic Location  Knee   Rt    Vasopneumatic Pressure Medium    Vasopneumatic Temperature  34 deg      Manual Therapy   Passive ROM PT assist pushing Rt knee into ext x 10 sec x 3 reps                    PT Short Term Goals - 08/25/19 1222      PT SHORT TERM GOAL #1   Title Patient to demonstrate improved gait pattern with walker progressing to cane as tolerated    Time 6    Period Weeks    Status Achieved    Target Date 09/04/19      PT SHORT TERM GOAL #2   Title Increase AROM Rt knee to 110 deg flexion and (-) 5 deg extension or better    Time 6    Period Weeks    Status Partially Met    Target Date 09/04/19      PT SHORT TERM GOAL #3   Title Independent in initial HEP    Time 6    Period Weeks    Status Achieved    Target Date 09/04/19             PT Long Term Goals - 08/25/19 1223      PT LONG TERM GOAL #1   Title Improve Rt knee extension to (-) 3 to 0 degrees and knee flexion to 120 deg    Time 12    Period Weeks    Status On-going      PT LONG TERM GOAL #2   Title Increase strength bilat hips to 5/5 in hip extension and abduction    Time 12    Period Weeks    Status Partially Met      PT LONG TERM GOAL #3   Title Independent in ambulation for community distances with no assistive device    Time 12    Period Weeks    Status On-going      PT LONG TERM GOAL #4   Time 12    Period Weeks  Status On-going      PT LONG TERM GOAL #5   Title Improve FOTO to </= 55% limitation    Time 12    Period Weeks    Status Achieved                 Plan - 08/25/19 1301    Clinical Impression Statement Pt reports good response to heel lift in shoe issued last session.  She had difficulty tolerating passive ext stretches last session; presents to therapy with increased pain.  Pt able to tolerate stairs well, although requires bilat UE support on rail/cane.  Pt has met  her FOTO goal.  Rt knee ROM gradually progressing.  Pt would benefit from continued PT intervention to maximize functional mobility and assist with return to work.    Rehab Potential Good    PT Frequency 2x / week    PT Duration 12 weeks    PT Treatment/Interventions Patient/family education;ADLs/Self Care Home Management;Cryotherapy;Electrical Stimulation;Iontophoresis 53m/ml Dexamethasone;Moist Heat;Ultrasound;Gait training;Stair training;Functional mobility training;Therapeutic activities;Therapeutic exercise;Balance training;Neuromuscular re-education;Manual techniques;Passive range of motion;Dry needling;Taping    PT Next Visit Plan continue progress Rt knee ROM with focus on knee ext.  functional strengthening and gait.    PT Home Exercise Plan [id number]   Consulted and Agree with Plan of Care Patient           Patient will benefit from skilled therapeutic intervention in order to improve the following deficits and impairments:     Visit Diagnosis: Acute pain of right knee  Muscle weakness (generalized)  Other symptoms and signs involving the musculoskeletal system  Abnormal gait     Problem List Patient Active Problem List   Diagnosis Date Noted  . OA (osteoarthritis) of knee 07/21/2019  . Hammer toe of left foot 07/23/2018  . Epigastric pain 07/23/2018  . Onychomycosis 07/23/2018  . Bilateral primary osteoarthritis of knee 03/05/2018  . Gout 03/05/2018  . Chronic pain of both knees 03/05/2018  . Status post laparoscopic sleeve gastrectomy 03/04/2018  . Obesity 05/23/2010  . LEG CRAMPS 07/17/2009  . Constipation 06/21/2009  . FATTY LIVER DISEASE 09/12/2007  . Essential hypertension 05/16/2007  . ARTHRITIS 05/16/2007  . DIVERTICULOSIS, COLON 10/18/2006  . HYPERCHOLESTEROLEMIA 09/05/2006  . Factor V deficiency ([place] 09/05/2006   JKerin Perna PTA 08/25/19 1:06 PM  [place] [address]Phone: [phone]  Fax:  [phone] Name: JNYKIA TURKOMRN: [id number] of Birth: [birth year]

## 2019-08-25 NOTE — Patient Instructions (Signed)

## 2019-08-28 ENCOUNTER — Ambulatory Visit (INDEPENDENT_AMBULATORY_CARE_PROVIDER_SITE_OTHER): Payer: No Typology Code available for payment source | Admitting: Physical Therapy

## 2019-08-28 ENCOUNTER — Encounter: Payer: Self-pay | Admitting: Physical Therapy

## 2019-08-28 ENCOUNTER — Other Ambulatory Visit: Payer: Self-pay

## 2019-08-28 DIAGNOSIS — M6281 Muscle weakness (generalized): Secondary | ICD-10-CM

## 2019-08-28 DIAGNOSIS — R269 Unspecified abnormalities of gait and mobility: Secondary | ICD-10-CM | POA: Diagnosis not present

## 2019-08-28 DIAGNOSIS — M25561 Pain in right knee: Secondary | ICD-10-CM

## 2019-08-28 DIAGNOSIS — R29898 Other symptoms and signs involving the musculoskeletal system: Secondary | ICD-10-CM | POA: Diagnosis not present

## 2019-08-28 NOTE — Therapy (Signed)
[place], [place], [address] Phone: [phone]   Fax:  [phone]  Physical Therapy Treatment  Patient Details  Name: Amanda [name] MRN: [id number] Date of Birth: [birth year] Referring Provider (PT): Dr Gaynelle Arabian   Encounter Date: 08/28/2019   PT End of Session - 08/28/19 1153    Visit Number 10    Number of Visits 24    Date for PT Re-Evaluation 10/16/19    PT Start Time 1148    PT Stop Time 1230    PT Time Calculation (min) 42 min    Activity Tolerance Patient tolerated treatment well    Behavior During Therapy [facility] for tasks assessed/performed           Past Medical History:  Diagnosis Date  . Allergy    seasonal  . Arthritis    all over  . Blood dyscrasia    Carrys trait for Leiden Factor five never had any issues . Yhe only reason she was tested was because her mother had it  . CPAP (continuous positive airway pressure) dependence    8 cm water w/ heated humidifier  . Diabetes mellitus without complication ([place])    Pre surgery - Gastric sx 2014 Roun-Y no diabetes since  no meds  . Fatty liver 4-08   by [place]  . Gout   . Hip pain   . Hypertension   . Knee pain   . Obesity   . Pneumonia   . Recurrent UTI   . Sleep apnea    had roux en y lost 140lbs no longer needs cpap    Past Surgical History:  Procedure Laterality Date  . APPENDECTOMY  1977  . [place]   one  . CHOLECYSTECTOMY  1977  . [place]  . reduction mammoplasty  1985  . ROUX-EN-Y GASTRIC BYPASS  2014  . TOTAL KNEE ARTHROPLASTY Right 07/21/2019   Procedure: TOTAL KNEE ARTHROPLASTY;  Surgeon: Gaynelle Arabian, MD;  Location: [facility];  Service: Orthopedics;  Laterality: Right;  38mn    There were no vitals filed for this visit.   Subjective Assessment - 08/28/19 1153    Subjective Pt reports her surgeon is pleased with progress and extension.  He said she  can come to therapy for 2 additional weeks.  She states she walked a mile on Tuesday, with cane.  Knee swelled some afterwards.    Patient Stated Goals rehab the knee and return to normal functional and work activities    Currently in Pain? Yes    Pain Score 2     Pain Location Knee    Pain Orientation Right    Pain Descriptors / Indicators Sore    Aggravating Factors  over stretching    Pain Relieving Factors ice, medication              [facility] PT Assessment - 08/28/19 0001      Assessment   Medical Diagnosis Rt TKA    Referring Provider (PT) Dr FGaynelle Arabian   Onset Date/Surgical Date 07/21/19   arthritic changes x several years    Hand Dominance Right    Next MD Visit 08/26/19    Prior Therapy here for hip pain            [facility] Adult PT Treatment/Exercise - 08/28/19 0001      Knee/Hip Exercises: Stretches   Gastroc Stretch Both;2 reps;30 seconds  Other Knee/Hip Stretches forward lunge (RLE) with foot on 12" step x 15 sec, then hamstring stretch with overpressure from pt x 15 sec x 6 reps      Knee/Hip Exercises: Aerobic   Recumbent Bike L1-2: 5 min     Other Aerobic Single laps in between exercises in between exercises to decrease stiffness.       Knee/Hip Exercises: Standing   Heel Raises Both;1 set;10 reps    Terminal Knee Extension Right;1 set;10 reps    Theraband Level (Terminal Knee Extension) Level 3 (Green)    Hip Abduction Stengthening;Right;Left;1 set;10 reps    Hip Extension Stengthening;Right;Left;1 set;Knee straight;10 reps    Forward Step Up Right;1 set;15 reps;Hand Hold: 2;Step Height: 6"    SLS Rt SLS x 3 reps (up to 6 sec)    Other Standing Knee Exercises tandem stance with intermittent UE support to steady x 20 sec 2 reps      Vasopneumatic   Number Minutes Vasopneumatic  10 minutes    Vasopnuematic Location  Knee   Rt    Vasopneumatic Pressure Medium    Vasopneumatic Temperature  34 deg                    PT Short Term Goals -  08/25/19 1222      PT SHORT TERM GOAL #1   Title Patient to demonstrate improved gait pattern with walker progressing to cane as tolerated    Time 6    Period Weeks    Status Achieved    Target Date 09/04/19      PT SHORT TERM GOAL #2   Title Increase AROM Rt knee to 110 deg flexion and (-) 5 deg extension or better    Time 6    Period Weeks    Status Partially Met    Target Date 09/04/19      PT SHORT TERM GOAL #3   Title Independent in initial HEP    Time 6    Period Weeks    Status Achieved    Target Date 09/04/19             PT Long Term Goals - 08/25/19 1223      PT LONG TERM GOAL #1   Title Improve Rt knee extension to (-) 3 to 0 degrees and knee flexion to 120 deg    Time 12    Period Weeks    Status On-going      PT LONG TERM GOAL #2   Title Increase strength bilat hips to 5/5 in hip extension and abduction    Time 12    Period Weeks    Status Partially Met      PT LONG TERM GOAL #3   Title Independent in ambulation for community distances with no assistive device    Time 12    Period Weeks    Status On-going      PT LONG TERM GOAL #4   Time 12    Period Weeks    Status On-going      PT LONG TERM GOAL #5   Title Improve FOTO to </= 55% limitation    Time 12    Period Weeks    Status Achieved                 Plan - 08/28/19 1224    Clinical Impression Statement Pt demonstrated improved exercise and standing tolerance this visit.  Verbally added SLS to HEP to assist  with improved balance with gait.  Pt tolerated session with minimal to no increase in pain.  Progressing towards LTGs.    Rehab Potential Good    PT Frequency 2x / week    PT Duration 12 weeks    PT Treatment/Interventions Patient/family education;ADLs/Self Care Home Management;Cryotherapy;Electrical Stimulation;Iontophoresis 30m/ml Dexamethasone;Moist Heat;Ultrasound;Gait training;Stair training;Functional mobility training;Therapeutic activities;Therapeutic exercise;Balance  training;Neuromuscular re-education;Manual techniques;Passive range of motion;Dry needling;Taping    PT Next Visit Plan continue progress Rt knee ROM with focus on knee ext.  functional strengthening and gait.    PT Home Exercise Plan [id number]    Consulted and Agree with Plan of Care Patient           Patient will benefit from skilled therapeutic intervention in order to improve the following deficits and impairments:  Decreased range of motion, Difficulty walking, Abnormal gait, Pain, Decreased activity tolerance, Decreased balance, Impaired flexibility, Decreased mobility, Decreased strength, Increased edema  Visit Diagnosis: Acute pain of right knee  Muscle weakness (generalized)  Other symptoms and signs involving the musculoskeletal system  Abnormal gait     Problem List Patient Active Problem List   Diagnosis Date Noted  . OA (osteoarthritis) of knee 07/21/2019  . Hammer toe of left foot 07/23/2018  . Epigastric pain 07/23/2018  . Onychomycosis 07/23/2018  . Bilateral primary osteoarthritis of knee 03/05/2018  . Gout 03/05/2018  . Chronic pain of both knees 03/05/2018  . Status post laparoscopic sleeve gastrectomy 03/04/2018  . Obesity 05/23/2010  . LEG CRAMPS 07/17/2009  . Constipation 06/21/2009  . FATTY LIVER DISEASE 09/12/2007  . Essential hypertension 05/16/2007  . ARTHRITIS 05/16/2007  . DIVERTICULOSIS, COLON 10/18/2006  . HYPERCHOLESTEROLEMIA 09/05/2006  . Factor V deficiency ([place] 09/05/2006   JKerin Perna PTA 08/28/19 12:27 PM  [place] [address]Phone: [phone]  Fax:  [phone] Name: JLANYAH SPENGLERMRN: [id number] of Birth: [birth year]

## 2019-08-29 ENCOUNTER — Telehealth: Payer: Self-pay | Admitting: Neurology

## 2019-08-29 NOTE — Telephone Encounter (Signed)
Sure

## 2019-08-29 NOTE — Telephone Encounter (Signed)
That's okay with me. 

## 2019-08-29 NOTE — Telephone Encounter (Signed)
Physical Deadline falls in July ( focus) Would it be ok to schedule a "Virtual" Physical with Amanda [name] on 09/05/19. There is another physical that morning. Are we able to squeeze that in? If not I will call the PT back to reschedule!!

## 2019-08-29 NOTE — Telephone Encounter (Signed)
Patient left vm asking since she had labs in her preop appt in April and since due to surgery on her knee, could she do a virtual physical to count for the active health physical for work? (works for [employer]) Please advise.

## 2019-08-29 NOTE — Telephone Encounter (Signed)
Please call patient to schedule virtual annual physical with Amanda [name]. Thanks!

## 2019-09-01 ENCOUNTER — Encounter: Payer: Self-pay | Admitting: Physical Therapy

## 2019-09-01 ENCOUNTER — Other Ambulatory Visit: Payer: Self-pay

## 2019-09-01 ENCOUNTER — Ambulatory Visit (INDEPENDENT_AMBULATORY_CARE_PROVIDER_SITE_OTHER): Payer: No Typology Code available for payment source | Admitting: Physical Therapy

## 2019-09-01 DIAGNOSIS — R29898 Other symptoms and signs involving the musculoskeletal system: Secondary | ICD-10-CM

## 2019-09-01 DIAGNOSIS — M25561 Pain in right knee: Secondary | ICD-10-CM | POA: Diagnosis not present

## 2019-09-01 DIAGNOSIS — R269 Unspecified abnormalities of gait and mobility: Secondary | ICD-10-CM | POA: Diagnosis not present

## 2019-09-01 DIAGNOSIS — M6281 Muscle weakness (generalized): Secondary | ICD-10-CM | POA: Diagnosis not present

## 2019-09-01 NOTE — Therapy (Signed)
[place], [place], [address] Phone: [phone]   Fax:  [phone]  Physical Therapy Treatment  Patient Details  Name: Amanda [name] MRN: [id number] Date of Birth: 04-19-1955 Referring Provider (PT): Dr Gaynelle Arabian   Encounter Date: 09/01/2019   PT End of Session - 09/01/19 1157    Visit Number 11    Number of Visits 24    Date for PT Re-Evaluation 10/16/19    PT Start Time 1151    PT Stop Time 1275    PT Time Calculation (min) 44 min    Activity Tolerance Patient tolerated treatment well           Past Medical History:  Diagnosis Date  . Allergy    seasonal  . Arthritis    all over  . Blood dyscrasia    Carrys trait for Leiden Factor five never had any issues . Yhe only reason she was tested was because her mother had it  . CPAP (continuous positive airway pressure) dependence    8 cm water w/ heated humidifier  . Diabetes mellitus without complication ([place])    Pre surgery - Gastric sx 2014 Roun-Y no diabetes since  no meds  . Fatty liver 4-08   by [place]  . Gout   . Hip pain   . Hypertension   . Knee pain   . Obesity   . Pneumonia   . Recurrent UTI   . Sleep apnea    had roux en y lost 140lbs no longer needs cpap    Past Surgical History:  Procedure Laterality Date  . APPENDECTOMY  1977  . [place]   one  . CHOLECYSTECTOMY  1977  . [place]  . reduction mammoplasty  1985  . ROUX-EN-Y GASTRIC BYPASS  2014  . TOTAL KNEE ARTHROPLASTY Right 07/21/2019   Procedure: TOTAL KNEE ARTHROPLASTY;  Surgeon: Gaynelle Arabian, MD;  [facility]: [facility];  Service: Orthopedics;  Laterality: Right;  61mn    There were no vitals filed for this visit.   Subjective Assessment - 09/01/19 1157    Subjective Pt reports she is more sore today.  She did do a lot of walking and cleaning on Saturday.  She is interested in joining gym and has  questions regarding riding stationary bike.    Patient Stated Goals rehab the knee and return to normal functional and work activities    Currently in Pain? Yes    Pain Score 6     Pain Location Knee    Pain Orientation Right    Pain Descriptors / Indicators Sore    Aggravating Factors  Overstretching    Pain Relieving Factors ice, medication              [facility] PT Assessment - 09/01/19 0001      Assessment   Medical Diagnosis Rt TKA    Referring Provider (PT) Dr FGaynelle Arabian   Onset Date/Surgical Date 07/21/19   arthritic changes x several years    Hand Dominance Right    Next MD Visit 09/30/19    Prior Therapy here for hip pain            [facility] Adult PT Treatment/Exercise - 09/01/19 0001      Self-Care   Self-Care Other Self-Care Comments    Other Self-Care Comments  Pt educated on TENS application; pt verbalized understanding.  Knee/Hip Exercises: Stretches   Passive Hamstring Stretch Right;2 reps;20 seconds    Gastroc Stretch Both;2 reps;30 seconds      Knee/Hip Exercises: Aerobic   Recumbent Bike L2-3: 6.5 min     Other Aerobic Single laps in between exercises in between exercises to decrease stiffness.       Knee/Hip Exercises: Standing   Heel Raises Both;1 set;10 reps    Terminal Knee Extension Right;1 set;10 reps    Theraband Level (Terminal Knee Extension) Level 3 (Green)    Hip Abduction Stengthening;Right;Left;1 set;10 reps    Hip Extension Stengthening;Right;Left;1 set;Knee straight;10 reps    Forward Step Up Right;1 set;10 reps;Hand Hold: 2;Step Height: 8"   cane in Rt hand, rail for Lt hand   SLS Rt SLS x 3 reps - 10 sec each    Gait Training side stepping with increased step height x 10 ft Rt/Lt.     Other Standing Knee Exercises tandem stance with intermittent UE support to steady x 20 sec 2 reps      Electrical Stimulation   Electrical Stimulation Location Rt ant knee    Electrical Stimulation Action TENS    Electrical Stimulation  Parameters intensity to tolerance     Electrical Stimulation Goals Pain      Vasopneumatic   Number Minutes Vasopneumatic  10 minutes    Vasopnuematic Location  Knee   Rt    Vasopneumatic Pressure Medium    Vasopneumatic Temperature  34 deg                    PT Short Term Goals - 08/25/19 1222      PT SHORT TERM GOAL #1   Title Patient to demonstrate improved gait pattern with walker progressing to cane as tolerated    Time 6    Period Weeks    Status Achieved    Target Date 09/04/19      PT SHORT TERM GOAL #2   Title Increase AROM Rt knee to 110 deg flexion and (-) 5 deg extension or better    Time 6    Period Weeks    Status Partially Met    Target Date 09/04/19      PT SHORT TERM GOAL #3   Title Independent in initial HEP    Time 6    Period Weeks    Status Achieved    Target Date 09/04/19             PT Long Term Goals - 08/25/19 1223      PT LONG TERM GOAL #1   Title Improve Rt knee extension to (-) 3 to 0 degrees and knee flexion to 120 deg    Time 12    Period Weeks    Status On-going      PT LONG TERM GOAL #2   Title Increase strength bilat hips to 5/5 in hip extension and abduction    Time 12    Period Weeks    Status Partially Met      PT LONG TERM GOAL #3   Title Independent in ambulation for community distances with no assistive device    Time 12    Period Weeks    Status On-going      PT LONG TERM GOAL #4   Time 12    Period Weeks    Status On-going      PT LONG TERM GOAL #5   Title Improve FOTO to </= 55% limitation  Time 12    Period Weeks    Status Achieved                 Plan - 09/01/19 1233    Clinical Impression Statement Pt reported slight reduction in Rt knee pain with exercises.  She was able to ascend 8" step with BUE support.  Rt SLS time improved to 10 sec this visit. Pt progressing towards LTGs.    Rehab Potential Good    PT Frequency 2x / week    PT Duration 12 weeks    PT  Treatment/Interventions Patient/family education;ADLs/Self Care Home Management;Cryotherapy;Electrical Stimulation;Iontophoresis 31m/ml Dexamethasone;Moist Heat;Ultrasound;Gait training;Stair training;Functional mobility training;Therapeutic activities;Therapeutic exercise;Balance training;Neuromuscular re-education;Manual techniques;Passive range of motion;Dry needling;Taping    PT Next Visit Plan continue progress Rt knee ROM with focus on knee ext.  functional strengthening (to prepare for watching 658mold grandbabies) and gait.  measure Rt knee ROM.    PT Home Exercise Plan [id number]    Consulted and Agree with Plan of Care Patient           Patient will benefit from skilled therapeutic intervention in order to improve the following deficits and impairments:  Decreased range of motion, Difficulty walking, Abnormal gait, Pain, Decreased activity tolerance, Decreased balance, Impaired flexibility, Decreased mobility, Decreased strength, Increased edema  Visit Diagnosis: Acute pain of right knee  Muscle weakness (generalized)  Other symptoms and signs involving the musculoskeletal system  Abnormal gait     Problem List Patient Active Problem List   Diagnosis Date Noted  . OA (osteoarthritis) of knee 07/21/2019  . Hammer toe of left foot 07/23/2018  . Epigastric pain 07/23/2018  . Onychomycosis 07/23/2018  . Bilateral primary osteoarthritis of knee 03/05/2018  . Gout 03/05/2018  . Chronic pain of both knees 03/05/2018  . Status post laparoscopic sleeve gastrectomy 03/04/2018  . Obesity 05/23/2010  . LEG CRAMPS 07/17/2009  . Constipation 06/21/2009  . FATTY LIVER DISEASE 09/12/2007  . Essential hypertension 05/16/2007  . ARTHRITIS 05/16/2007  . DIVERTICULOSIS, COLON 10/18/2006  . HYPERCHOLESTEROLEMIA 09/05/2006  . Factor V deficiency ([place]07/30/2008   Amanda PernaPTA 09/01/19 12:47 PM  [place]: [phone] Fax:  [phone]: Amanda HEWSONRN: [id number] of Birth: [birth year]

## 2019-09-04 ENCOUNTER — Encounter: Payer: Self-pay | Admitting: Physical Therapy

## 2019-09-04 ENCOUNTER — Ambulatory Visit (INDEPENDENT_AMBULATORY_CARE_PROVIDER_SITE_OTHER): Payer: No Typology Code available for payment source | Admitting: Physical Therapy

## 2019-09-04 ENCOUNTER — Other Ambulatory Visit: Payer: Self-pay

## 2019-09-04 DIAGNOSIS — R29898 Other symptoms and signs involving the musculoskeletal system: Secondary | ICD-10-CM

## 2019-09-04 DIAGNOSIS — R269 Unspecified abnormalities of gait and mobility: Secondary | ICD-10-CM

## 2019-09-04 DIAGNOSIS — M6281 Muscle weakness (generalized): Secondary | ICD-10-CM

## 2019-09-04 DIAGNOSIS — M25561 Pain in right knee: Secondary | ICD-10-CM | POA: Diagnosis not present

## 2019-09-04 NOTE — Therapy (Signed)
[place], [place], [address] Phone: [phone]   Fax:  [phone]  Physical Therapy Treatment  Patient Details  Name: Amanda [name] MRN: [id number] Date of Birth: [birth year] Referring Provider (PT): Dr Gaynelle Arabian   Encounter Date: 09/04/2019   PT End of Session - 09/04/19 1148    Visit Number 12    Number of Visits 24    Date for PT Re-Evaluation 10/16/19    PT Start Time 1148    PT Stop Time 1240    PT Time Calculation (min) 52 min    Activity Tolerance Patient tolerated treatment well    Behavior During Therapy [facility] for tasks assessed/performed           Past Medical History:  Diagnosis Date  . Allergy    seasonal  . Arthritis    all over  . Blood dyscrasia    Carrys trait for Leiden Factor five never had any issues . Yhe only reason she was tested was because her mother had it  . CPAP (continuous positive airway pressure) dependence    8 cm water w/ heated humidifier  . Diabetes mellitus without complication ([place])    Pre surgery - Gastric sx 2014 Roun-Y no diabetes since  no meds  . Fatty liver 4-08   by [place]  . Gout   . Hip pain   . Hypertension   . Knee pain   . Obesity   . Pneumonia   . Recurrent UTI   . Sleep apnea    had roux en y lost 140lbs no longer needs cpap    Past Surgical History:  Procedure Laterality Date  . APPENDECTOMY  1977  . [place]   one  . CHOLECYSTECTOMY  1977  . [place]  . reduction mammoplasty  1985  . ROUX-EN-Y GASTRIC BYPASS  2014  . TOTAL KNEE ARTHROPLASTY Right 07/21/2019   Procedure: TOTAL KNEE ARTHROPLASTY;  Surgeon: Gaynelle Arabian, MD;  Location: [facility];  Service: Orthopedics;  Laterality: Right;  44mn    There were no vitals filed for this visit.   Subjective Assessment - 09/04/19 1151    Subjective Pt reports she is doing well, slight pain just in the midline of incision     Patient Stated Goals rehab the knee and return to normal functional and work activities    Currently in Pain? Yes    Pain Score 2     Pain Location Knee    Pain Orientation Right                             [facility] Adult PT Treatment/Exercise - 09/04/19 0001      Knee/Hip Exercises: Stretches   Active Hamstring Stretch Right    Active Hamstring Stretch Limitations with knee presses       Knee/Hip Exercises: Aerobic   Recumbent Bike L 3x6'      Knee/Hip Exercises: Standing   Forward Step Up 10 reps;Right    Forward Step Up Limitations onto bosu ball     Step Down 2 sets    SLS Rt with Lt foot taps FWD/side/BWD    Other Standing Knee Exercises 5x30 sec standing on BOSU with UE assist PRN       Knee/Hip Exercises: Supine   Heel Slides AROM;Right;10 reps    Heel Slides Limitations scooting down to  work on Psychologist, sport and exercise Both;2 sets;10 reps    Bridges Limitations with knees in/out green band      Vasopneumatic   Number Minutes Vasopneumatic  10 minutes    Vasopnuematic Location  Knee    Vasopneumatic Pressure Medium    Vasopneumatic Temperature  34 deg                    PT Short Term Goals - 08/25/19 1222      PT SHORT TERM GOAL #1   Title Patient to demonstrate improved gait pattern with walker progressing to cane as tolerated    Time 6    Period Weeks    Status Achieved    Target Date 09/04/19      PT SHORT TERM GOAL #2   Title Increase AROM Rt knee to 110 deg flexion and (-) 5 deg extension or better    Time 6    Period Weeks    Status Partially Met    Target Date 09/04/19      PT SHORT TERM GOAL #3   Title Independent in initial HEP    Time 6    Period Weeks    Status Achieved    Target Date 09/04/19             PT Long Term Goals - 08/25/19 1223      PT LONG TERM GOAL #1   Title Improve Rt knee extension to (-) 3 to 0 degrees and knee flexion to 120 deg    Time 12    Period Weeks    Status On-going      PT  LONG TERM GOAL #2   Title Increase strength bilat hips to 5/5 in hip extension and abduction    Time 12    Period Weeks    Status Partially Met      PT LONG TERM GOAL #3   Title Independent in ambulation for community distances with no assistive device    Time 12    Period Weeks    Status On-going      PT LONG TERM GOAL #4   Time 12    Period Weeks    Status On-going      PT LONG TERM GOAL #5   Title Improve FOTO to </= 55% limitation    Time 12    Period Weeks    Status Achieved                 Plan - 09/04/19 1236    Clinical Impression Statement Amanda [name] has one point of tenderness, 2/10, mid incision today, she reports this is new.  Her biggest concern is the feelings of instability in the leg with walking.  Worked on proprioception today to improve this.    Rehab Potential Good    PT Frequency 2x / week    PT Duration 12 weeks    PT Treatment/Interventions Patient/family education;ADLs/Self Care Home Management;Cryotherapy;Electrical Stimulation;Iontophoresis 65m/ml Dexamethasone;Moist Heat;Ultrasound;Gait training;Stair training;Functional mobility training;Therapeutic activities;Therapeutic exercise;Balance training;Neuromuscular re-education;Manual techniques;Passive range of motion;Dry needling;Taping    PT Next Visit Plan continue progress Rt knee ROM with focus on knee ext.  functional strengthening and gait.  measure Rt knee ROM.    PT Home Exercise Plan [id number]   Consulted and Agree with Plan of Care Patient           Patient will benefit from skilled therapeutic intervention in order to improve the following deficits and  impairments:  Decreased range of motion, Difficulty walking, Abnormal gait, Pain, Decreased activity tolerance, Decreased balance, Impaired flexibility, Decreased mobility, Decreased strength, Increased edema  Visit Diagnosis: Acute pain of right knee  Muscle weakness (generalized)  Other symptoms and signs involving the musculoskeletal  system  Abnormal gait     Problem List Patient Active Problem List   Diagnosis Date Noted  . OA (osteoarthritis) of knee 07/21/2019  . Hammer toe of left foot 07/23/2018  . Epigastric pain 07/23/2018  . Onychomycosis 07/23/2018  . Bilateral primary osteoarthritis of knee 03/05/2018  . Gout 03/05/2018  . Chronic pain of both knees 03/05/2018  . Status post laparoscopic sleeve gastrectomy 03/04/2018  . Obesity 05/23/2010  . LEG CRAMPS 07/17/2009  . Constipation 06/21/2009  . FATTY LIVER DISEASE 09/12/2007  . Essential hypertension 05/16/2007  . ARTHRITIS 05/16/2007  . DIVERTICULOSIS, COLON 10/18/2006  . HYPERCHOLESTEROLEMIA 09/05/2006  . Factor V deficiency ([place]) 09/05/2006    Jeral Pinch PT  09/04/2019, 12:38 PM  [facility] [name], [place], [address] Phone: [phone]   Fax:  [phone]  Name: Amanda [name] MRN: [id number] Date of Birth: 18-Jun-1955

## 2019-09-05 ENCOUNTER — Telehealth (INDEPENDENT_AMBULATORY_CARE_PROVIDER_SITE_OTHER): Payer: No Typology Code available for payment source | Admitting: Medical-Surgical

## 2019-09-05 ENCOUNTER — Encounter: Payer: Self-pay | Admitting: Medical-Surgical

## 2019-09-05 VITALS — BP 118/73 | Ht 68.0 in | Wt 212.0 lb

## 2019-09-05 DIAGNOSIS — Z0000 Encounter for general adult medical examination without abnormal findings: Secondary | ICD-10-CM | POA: Diagnosis not present

## 2019-09-05 NOTE — Progress Notes (Signed)
Virtual Visit via Video Note  I connected with Amanda [name] on 09/05/19 at 10:30 AM EDT by a video enabled telemedicine application and verified that I am speaking with the correct person using two identifiers.   I discussed the limitations of evaluation and management by telemedicine and the availability of in person appointments. The patient expressed understanding and agreed to proceed.  Patient location: home Provider locations: office  Subjective:    CC: virtual physical exam  HPI: Very pleasant 64 year old female presenting via [place] video visit for a virtual annual physical exam. She had a total knee replacement approximately 6 weeks ago and is up to date on her labs. Is currently doing PT twice weekly and is recovering well. No concerns today.  Dentist: every 6 months, no concerns Eye exam: last year, wears glasses Diet: bariatric surgery several years ago, still has limitations on what she can eat; tries to eat lean meats, veggies, and fruits but does occasionally eat steak Exercise: was previously working the floor as a Marine scientist but has had activity limitation since her surgery; plans to join [employer] soon to maintain her knee rehab and get back to her baseline  Mammogram: due, goes to [facility] and will have that done in September (6 months from last COVID vaccine) Colonoscopy: done in 2019, normal, due in 2029 Pap smear: last showing done in 2014, due for repeat, patient will address when she is able to come back into the office.  HTN- has only been taking Losartan 50mg  daily instead of twice daily as prescribed since she was having low BPs at PT on the twice daily dosing. Taking Spironolactone 25mg  daily. Checking BP sporadically at home.  Past medical history, Surgical history, Family history not pertinant except as noted below, Social history, Allergies, and medications have been entered into the medical record, reviewed, and corrections made.   Review of  Systems:  Constitutional:  No  fever, no chills, No recent illness, No unintentional weight changes. No significant fatigue.   HEENT: No  headache, no vision change, no hearing change, No sore throat, No  sinus pressure  Cardiac: No  chest pain, No  pressure, No palpitations, No  Orthopnea  Respiratory:  No  shortness of breath. No  Cough  Gastrointestinal: No  abdominal pain, No  nausea, No  vomiting,  No  blood in stool, No  diarrhea, No  constipation   Musculoskeletal: No new myalgia/arthralgia  Skin: No  Rash, No other wounds/concerning lesions  Genitourinary: No  incontinence, No  abnormal genital bleeding, No abnormal genital discharge  Hem/Onc: No  easy bruising/bleeding, No  abnormal lymph node  Endocrine: No cold intolerance,  No heat intolerance. No polyuria/polydipsia/polyphagia   Neurologic: No  weakness, No  dizziness, No  slurred speech/focal weakness/facial droop  Psychiatric: No  concerns with depression, No  concerns with anxiety, No sleep problems, No mood problems  Objective:    General: Speaking clearly in complete sentences without any shortness of breath.  Alert and oriented x3.  Normal judgment. No apparent acute distress.  Impression and Recommendations:    1. Annual physical exam Up to date on lab work. Due for mammogram- patient will schedule with NH. Due for pap- will address with in office visit when able. Continue exercise as tolerated and healthy diet choices. Does not need medication refills today.   2. Essential hypertension Continue Losartan 50mg  daily and Spironolactone 25mg  daily. Monitor BP at home with goal of 130/80 or less.   Return in  about 1 year (around 09/04/2020) for annual physical exam; as directed by [facility] for chronic health conditions.  30 minutes of non-face-to-face time was provided during this encounter.   I discussed the assessment and treatment plan with the patient. The patient was provided an opportunity to ask questions  and all were answered. The patient agreed with the plan and demonstrated an understanding of the instructions.   The patient was advised to call back or seek an in-person evaluation if the symptoms worsen or if the condition fails to improve as anticipated.  Clearnce Sorrel, DNP, APRN, FNP-BC [facility]

## 2019-09-05 NOTE — Progress Notes (Signed)
Yearly CPE.

## 2019-09-09 ENCOUNTER — Ambulatory Visit (INDEPENDENT_AMBULATORY_CARE_PROVIDER_SITE_OTHER): Payer: No Typology Code available for payment source | Admitting: Physical Therapy

## 2019-09-09 ENCOUNTER — Other Ambulatory Visit: Payer: Self-pay

## 2019-09-09 ENCOUNTER — Encounter: Payer: Self-pay | Admitting: Physical Therapy

## 2019-09-09 DIAGNOSIS — M25561 Pain in right knee: Secondary | ICD-10-CM | POA: Diagnosis not present

## 2019-09-09 DIAGNOSIS — R29898 Other symptoms and signs involving the musculoskeletal system: Secondary | ICD-10-CM

## 2019-09-09 DIAGNOSIS — M6281 Muscle weakness (generalized): Secondary | ICD-10-CM | POA: Diagnosis not present

## 2019-09-09 DIAGNOSIS — R269 Unspecified abnormalities of gait and mobility: Secondary | ICD-10-CM

## 2019-09-09 NOTE — Therapy (Signed)
[place], [place], [address] Phone: [phone]   Fax:  [phone]  Physical Therapy Treatment  Patient Details  Name: Amanda [name] MRN: [id number] Date of Birth: March 07, 1955 Referring Provider (PT): Dr Gaynelle Arabian   Encounter Date: 09/09/2019   PT End of Session - 09/09/19 1106    Visit Number 13    Number of Visits 24    Date for PT Re-Evaluation 10/16/19    PT Start Time 1103    PT Stop Time 1150    PT Time Calculation (min) 47 min    Activity Tolerance Patient tolerated treatment well    Behavior During Therapy [name] for tasks assessed/performed           Past Medical History:  Diagnosis Date  . Allergy    seasonal  . Arthritis    all over  . Blood dyscrasia    Carrys trait for Leiden Factor five never had any issues . Yhe only reason she was tested was because her mother had it  . CPAP (continuous positive airway pressure) dependence    8 cm water w/ heated humidifier  . Diabetes mellitus without complication ([place])    Pre surgery - Gastric sx 2014 Roun-Y no diabetes since  no meds  . Fatty liver 4-08   by [place]  . Gout   . Hip pain   . Hypertension   . Knee pain   . Obesity   . Pneumonia   . Recurrent UTI   . Sleep apnea    had roux en y lost 140lbs no longer needs cpap    Past Surgical History:  Procedure Laterality Date  . APPENDECTOMY  1977  . [place]   one  . CHOLECYSTECTOMY  1977  . [place]  . reduction mammoplasty  1985  . ROUX-EN-Y GASTRIC BYPASS  2014  . TOTAL KNEE ARTHROPLASTY Right 07/21/2019   Procedure: TOTAL KNEE ARTHROPLASTY;  Surgeon: Gaynelle Arabian, MD;  Location: [facility];  Service: Orthopedics;  Laterality: Right;  41mn    There were no vitals filed for this visit.   Subjective Assessment - 09/09/19 1107    Subjective Pain greatest with sitting and at night. she has trouble getting comfortable.     Pertinent History arthritis; Fx Lt patella 2018; LBP and Rt hip pain; HTN    Diagnostic tests xrays    Patient Stated Goals rehab the knee and return to normal functional and work activities    Currently in Pain? Yes    Pain Score 3     Pain Location Knee    Pain Orientation Right    Pain Descriptors / Indicators Sore    Pain Type Surgical pain                             [facility] Adult PT Treatment/Exercise - 09/09/19 0001      Knee/Hip Exercises: Stretches   Other Knee/Hip Stretches active right hip ABD stretch at TM x 10      Knee/Hip Exercises: Aerobic   Recumbent Bike L 3x6'      Knee/Hip Exercises: Machines for Strengthening   Cybex Knee Extension 10 pounds bil up; right down x 10; 20# x 10 bil    Cybex Leg Press 6 plates x 10; 7 plates x 10 with prolonged hold for ext stretch  Knee/Hip Exercises: Standing   Hip Abduction Left;1 set;10 reps    Lateral Step Up Right;15 reps    Lateral Step Up Limitations onto bosu ball; cues to avoid circumduction; some pain reported    Forward Step Up Right;15 reps;Hand Hold: 2    Forward Step Up Limitations onto bosu ball; also 10 step ups onto 6 inch step with cues to avoid truck flex, squeeze gluteals      Vasopneumatic   Number Minutes Vasopneumatic  10 minutes    Vasopnuematic Location  Knee    Vasopneumatic Pressure Medium    Vasopneumatic Temperature  34                    PT Short Term Goals - 08/25/19 1222      PT SHORT TERM GOAL #1   Title Patient to demonstrate improved gait pattern with walker progressing to cane as tolerated    Time 6    Period Weeks    Status Achieved    Target Date 09/04/19      PT SHORT TERM GOAL #2   Title Increase AROM Rt knee to 110 deg flexion and (-) 5 deg extension or better    Time 6    Period Weeks    Status Partially Met    Target Date 09/04/19      PT SHORT TERM GOAL #3   Title Independent in initial HEP    Time 6    Period Weeks    Status  Achieved    Target Date 09/04/19             PT Long Term Goals - 08/25/19 1223      PT LONG TERM GOAL #1   Title Improve Rt knee extension to (-) 3 to 0 degrees and knee flexion to 120 deg    Time 12    Period Weeks    Status On-going      PT LONG TERM GOAL #2   Title Increase strength bilat hips to 5/5 in hip extension and abduction    Time 12    Period Weeks    Status Partially Met      PT LONG TERM GOAL #3   Title Independent in ambulation for community distances with no assistive device    Time 12    Period Weeks    Status On-going      PT LONG TERM GOAL #4   Time 12    Period Weeks    Status On-going      PT LONG TERM GOAL #5   Title Improve FOTO to </= 55% limitation    Time 12    Period Weeks    Status Achieved                 Plan - 09/09/19 1142    Clinical Impression Statement Amanda [name] did well with increased strengthening on machines. She needs to get ready to take care of her 3 grandbabies beginning in 2 weeks. Working on carrying would benefit her.    PT Treatment/Interventions Patient/family education;ADLs/Self Care Home Management;Cryotherapy;Electrical Stimulation;Iontophoresis 45m/ml Dexamethasone;Moist Heat;Ultrasound;Gait training;Stair training;Functional mobility training;Therapeutic activities;Therapeutic exercise;Balance training;Neuromuscular re-education;Manual techniques;Passive range of motion;Dry needling;Taping    PT Next Visit Plan functional strengthening and gait.  measure Rt knee ROM. Work on carrying wt.    PT Home Exercise Plan [id number]          Patient will benefit from skilled therapeutic intervention in order to improve the following  deficits and impairments:  Decreased range of motion, Difficulty walking, Abnormal gait, Pain, Decreased activity tolerance, Decreased balance, Impaired flexibility, Decreased mobility, Decreased strength, Increased edema  Visit Diagnosis: Acute pain of right knee  Muscle weakness  (generalized)  Other symptoms and signs involving the musculoskeletal system  Abnormal gait     Problem List Patient Active Problem List   Diagnosis Date Noted  . OA (osteoarthritis) of knee 07/21/2019  . Hammer toe of left foot 07/23/2018  . Epigastric pain 07/23/2018  . Onychomycosis 07/23/2018  . Bilateral primary osteoarthritis of knee 03/05/2018  . Gout 03/05/2018  . Chronic pain of both knees 03/05/2018  . Status post laparoscopic sleeve gastrectomy 03/04/2018  . Obesity 05/23/2010  . LEG CRAMPS 07/17/2009  . Constipation 06/21/2009  . FATTY LIVER DISEASE 09/12/2007  . Essential hypertension 05/16/2007  . ARTHRITIS 05/16/2007  . DIVERTICULOSIS, COLON 10/18/2006  . HYPERCHOLESTEROLEMIA 09/05/2006  . Factor V deficiency ([place]) 09/05/2006    Madelyn Flavors PT 09/09/2019, 1:09 PM  [facility] [place], [place], [address] Phone: [phone]   Fax:  [phone]  Name: Amanda [name] MRN: [id number] Date of Birth: 1955/06/29

## 2019-09-11 ENCOUNTER — Ambulatory Visit (INDEPENDENT_AMBULATORY_CARE_PROVIDER_SITE_OTHER): Payer: No Typology Code available for payment source | Admitting: Physical Therapy

## 2019-09-11 ENCOUNTER — Other Ambulatory Visit: Payer: Self-pay

## 2019-09-11 ENCOUNTER — Encounter: Payer: Self-pay | Admitting: Physical Therapy

## 2019-09-11 DIAGNOSIS — R269 Unspecified abnormalities of gait and mobility: Secondary | ICD-10-CM

## 2019-09-11 DIAGNOSIS — R29898 Other symptoms and signs involving the musculoskeletal system: Secondary | ICD-10-CM | POA: Diagnosis not present

## 2019-09-11 DIAGNOSIS — M25561 Pain in right knee: Secondary | ICD-10-CM | POA: Diagnosis not present

## 2019-09-11 DIAGNOSIS — M6281 Muscle weakness (generalized): Secondary | ICD-10-CM | POA: Diagnosis not present

## 2019-09-11 NOTE — Therapy (Signed)
[place], [place], [address] Phone: [phone]   Fax:  [phone]  Physical Therapy Treatment  Patient Details  Name: Amanda [name] MRN: [id number] Date of Birth: [birth year] Referring Provider (PT): Dr Gaynelle Arabian   Encounter Date: 09/11/2019   PT End of Session - 09/11/19 1104    Visit Number 14    Number of Visits 24    Date for PT Re-Evaluation 10/16/19    PT Start Time 1102    PT Stop Time 1204    PT Time Calculation (min) 62 min    Activity Tolerance Patient tolerated treatment well    Behavior During Therapy [facility] for tasks assessed/performed           Past Medical History:  Diagnosis Date  . Allergy    seasonal  . Arthritis    all over  . Blood dyscrasia    Carrys trait for Leiden Factor five never had any issues . Yhe only reason she was tested was because her mother had it  . CPAP (continuous positive airway pressure) dependence    8 cm water w/ heated humidifier  . Diabetes mellitus without complication ([place])    Pre surgery - Gastric sx 2014 Roun-Y no diabetes since  no meds  . Fatty liver 4-08   by [place]  . Gout   . Hip pain   . Hypertension   . Knee pain   . Obesity   . Pneumonia   . Recurrent UTI   . Sleep apnea    had roux en y lost 140lbs no longer needs cpap    Past Surgical History:  Procedure Laterality Date  . APPENDECTOMY  1977  . [place]   one  . CHOLECYSTECTOMY  1977  . [place]  . reduction mammoplasty  1985  . ROUX-EN-Y GASTRIC BYPASS  2014  . TOTAL KNEE ARTHROPLASTY Right 07/21/2019   Procedure: TOTAL KNEE ARTHROPLASTY;  Surgeon: Gaynelle Arabian, MD;  Location: [facility];  Service: Orthopedics;  Laterality: Right;  47mn    There were no vitals filed for this visit.   Subjective Assessment - 09/11/19 1107    Subjective Sore from last visit    Pertinent History arthritis; Fx Lt patella 2018; LBP  and Rt hip pain; HTN    Diagnostic tests xrays    Patient Stated Goals rehab the knee and return to normal functional and work activities    Currently in Pain? Yes    Pain Score 3     Pain Location Knee    Pain Orientation Right;Posterior    Pain Descriptors / Indicators Sore    Pain Type Surgical pain              [facility] PT Assessment - 09/11/19 0001      AROM   Right Knee Flexion 115                         [facility] Adult PT Treatment/Exercise - 09/11/19 0001      Knee/Hip Exercises: Stretches   Active Hamstring Stretch Right;1 rep;60 seconds    Active Hamstring Stretch Limitations with strap    Passive Hamstring Stretch Right;2 reps;60 seconds    Passive Hamstring Stretch Limitations with strap    Quad Stretch Right;2 reps;30 seconds    Quad Stretch Limitations prone with strap      Knee/Hip Exercises: Aerobic  Recumbent Bike L 3x6'      Knee/Hip Exercises: Machines for Strengthening   Cybex Knee Extension 10 pounds bil warm up then bil up; right down x 10; 20# x 2x 5 bil    Cybex Leg Press 5 plates to warm up x 10, then 10 ea at 6 plates and 7 plates; seat 4      Knee/Hip Exercises: Standing   Other Standing Knee Exercises staggered stance on 4 inch step with head turns bil, standing balance with eyes closed 4x 10 sec, then worked in corner with semi tandem stance and head turns eyes open.    Other Standing Knee Exercises standing with toes on 1/2 foam roller to challenge COG and help strengthen postural muscles.      Modalities   Modalities Psychologist, educational Location Rt knee post and Network engineer Action IFC    Electrical Stimulation Parameters to tolerance x 10 min    Electrical Stimulation Goals Pain      Vasopneumatic   Number Minutes Vasopneumatic  10 minutes    Vasopnuematic Location  Knee    Vasopneumatic Pressure Medium    Vasopneumatic Temperature  34       Manual Therapy   Manual Therapy Soft tissue mobilization;Passive ROM    Manual therapy comments prone    Soft tissue mobilization to right popliteus and distal HS manual and with IASTM (roller)    Passive ROM for extension using contract relax                  PT Education - 09/11/19 1258    Education Details balance activities in corner with chair support as needed    Person(s) Educated Patient    Methods Explanation;Demonstration    Comprehension Verbalized understanding;Returned demonstration            PT Short Term Goals - 08/25/19 1222      PT SHORT TERM GOAL #1   Title Patient to demonstrate improved gait pattern with walker progressing to cane as tolerated    Time 6    Period Weeks    Status Achieved    Target Date 09/04/19      PT SHORT TERM GOAL #2   Title Increase AROM Rt knee to 110 deg flexion and (-) 5 deg extension or better    Time 6    Period Weeks    Status Partially Met    Target Date 09/04/19      PT SHORT TERM GOAL #3   Title Independent in initial HEP    Time 6    Period Weeks    Status Achieved    Target Date 09/04/19             PT Long Term Goals - 08/25/19 1223      PT LONG TERM GOAL #1   Title Improve Rt knee extension to (-) 3 to 0 degrees and knee flexion to 120 deg    Time 12    Period Weeks    Status On-going      PT LONG TERM GOAL #2   Title Increase strength bilat hips to 5/5 in hip extension and abduction    Time 12    Period Weeks    Status Partially Met      PT LONG TERM GOAL #3   Title Independent in ambulation for community distances with no assistive device  Time 12    Period Weeks    Status On-going      PT LONG TERM GOAL #4   Time 12    Period Weeks    Status On-going      PT LONG TERM GOAL #5   Title Improve FOTO to </= 55% limitation    Time 12    Period Weeks    Status Achieved                 Plan - 09/11/19 1258    Clinical Impression Statement Patient presents today  with increased soreness from previous visit. She c/o post knee pain (popliteus) with stretches. She tolerated manual work fairly well. PT advised ice for home and for her to resume prone knee hang. She reports she intially did prone hang for 6 min which may have caused popliteal pain. Pateint demo's significant balance deficits with eyes closed (falling backwards) and also with head turns when eyes are open. Her COG needs to be neutralized.    PT Treatment/Interventions Patient/family education;ADLs/Self Care Home Management;Cryotherapy;Electrical Stimulation;Iontophoresis 76m/ml Dexamethasone;Moist Heat;Ultrasound;Gait training;Stair training;Functional mobility training;Therapeutic activities;Therapeutic exercise;Balance training;Neuromuscular re-education;Manual techniques;Passive range of motion;Dry needling;Taping    PT Next Visit Plan Measure extension, Review balance activities, work on normalizing COG, functional strengthening and gait.  Progress to carrying wt.    PT Home Exercise Plan [id number]          Patient will benefit from skilled therapeutic intervention in order to improve the following deficits and impairments:  Decreased range of motion, Difficulty walking, Abnormal gait, Pain, Decreased activity tolerance, Decreased balance, Impaired flexibility, Decreased mobility, Decreased strength, Increased edema  Visit Diagnosis: Acute pain of right knee  Muscle weakness (generalized)  Other symptoms and signs involving the musculoskeletal system  Abnormal gait     Problem List Patient Active Problem List   Diagnosis Date Noted  . OA (osteoarthritis) of knee 07/21/2019  . Hammer toe of left foot 07/23/2018  . Epigastric pain 07/23/2018  . Onychomycosis 07/23/2018  . Bilateral primary osteoarthritis of knee 03/05/2018  . Gout 03/05/2018  . Chronic pain of both knees 03/05/2018  . Status post laparoscopic sleeve gastrectomy 03/04/2018  . Obesity 05/23/2010  . LEG CRAMPS  07/17/2009  . Constipation 06/21/2009  . FATTY LIVER DISEASE 09/12/2007  . Essential hypertension 05/16/2007  . ARTHRITIS 05/16/2007  . DIVERTICULOSIS, COLON 10/18/2006  . HYPERCHOLESTEROLEMIA 09/05/2006  . Factor V deficiency ([place] 09/05/2006    Amanda FlavorsPT 09/11/2019, 1:06 PM  [facility] [address]Phone: [phone]  Fax:  [phone] Name: Amanda FOSCOMRN: [id number] of Birth: [birth year]

## 2019-09-15 ENCOUNTER — Other Ambulatory Visit: Payer: Self-pay

## 2019-09-15 ENCOUNTER — Ambulatory Visit (INDEPENDENT_AMBULATORY_CARE_PROVIDER_SITE_OTHER): Payer: No Typology Code available for payment source | Admitting: Physical Therapy

## 2019-09-15 ENCOUNTER — Encounter: Payer: Self-pay | Admitting: Physical Therapy

## 2019-09-15 DIAGNOSIS — M6281 Muscle weakness (generalized): Secondary | ICD-10-CM

## 2019-09-15 DIAGNOSIS — R29898 Other symptoms and signs involving the musculoskeletal system: Secondary | ICD-10-CM

## 2019-09-15 DIAGNOSIS — M25561 Pain in right knee: Secondary | ICD-10-CM

## 2019-09-15 DIAGNOSIS — R269 Unspecified abnormalities of gait and mobility: Secondary | ICD-10-CM | POA: Diagnosis not present

## 2019-09-15 NOTE — Therapy (Signed)
[place], [place], [address] Phone: [phone]   Fax:  [phone]  Physical Therapy Treatment  Patient Details  Name: Amanda [name] MRN: [id number] Date of Birth: 07-20-1955 Referring Provider (PT): Dr Gaynelle Arabian   Encounter Date: 09/15/2019   PT End of Session - 09/15/19 1110    Visit Number 15    Number of Visits 24    Date for PT Re-Evaluation 10/16/19    PT Start Time 1103    PT Stop Time 1142    PT Time Calculation (min) 39 min           Past Medical History:  Diagnosis Date  . Allergy    seasonal  . Arthritis    all over  . Blood dyscrasia    Carrys trait for Leiden Factor five never had any issues . Yhe only reason she was tested was because her mother had it  . CPAP (continuous positive airway pressure) dependence    8 cm water w/ heated humidifier  . Diabetes mellitus without complication ([place])    Pre surgery - Gastric sx 2014 Roun-Y no diabetes since  no meds  . Fatty liver 4-08   by [place]  . Gout   . Hip pain   . Hypertension   . Knee pain   . Obesity   . Pneumonia   . Recurrent UTI   . Sleep apnea    had roux en y lost 140lbs no longer needs cpap    Past Surgical History:  Procedure Laterality Date  . APPENDECTOMY  1977  . [place]   one  . CHOLECYSTECTOMY  1977  . [place]  . reduction mammoplasty  1985  . ROUX-EN-Y GASTRIC BYPASS  2014  . TOTAL KNEE ARTHROPLASTY Right 07/21/2019   Procedure: TOTAL KNEE ARTHROPLASTY;  Surgeon: Gaynelle Arabian, MD;  Location[facility];  Service: Orthopedics;  Laterality: Right;  19mn    There were no vitals filed for this visit.   Subjective Assessment - 09/15/19 1110    Subjective Pt reports the back of her Rt knee is still sore; she has tried everything and it hasn't resolved.  She states she almost tripped in parking lot of [employer](uneven ground).    Patient  Stated Goals rehab the knee and return to normal functional and work activities    Currently in Pain? Yes    Pain Score 3     Pain Location Knee    Pain Orientation Posterior;Right    Pain Descriptors / Indicators Sore    Aggravating Factors  overstretching    Pain Relieving Factors ice, heat, massage, medication              [facility] PT Assessment - 09/15/19 0001      Assessment   Medical Diagnosis Rt TKA    Referring Provider (PT) Dr FGaynelle Arabian   Onset Date/Surgical Date 07/21/19   arthritic changes x several years    Hand Dominance Right    Next MD Visit 09/30/19    Prior Therapy here for hip pain       AROM   Right Knee Extension -15           [facility] Adult PT Treatment/Exercise - 09/15/19 0001      Knee/Hip Exercises: Stretches   Passive Hamstring Stretch Right;4 reps;20 seconds   PTA assist with overpressure, in supine  Gastroc Stretch Both;2 reps;60 seconds      Knee/Hip Exercises: Aerobic   Recumbent Bike L1-3: 5 min       Knee/Hip Exercises: Machines for Strengthening   Cybex Knee Extension 2 plates, BLE up, lowering with RLE x 5 rep, repeated with 1 plate      Knee/Hip Exercises: Standing   Terminal Knee Extension Right;1 set;10 reps    Theraband Level (Terminal Knee Extension) Level 4 (Blue)   tactile cues for form   Terminal Knee Extension Limitations pt reported increased pain in back of knee after this exercise    Functional Squat 5 reps;2 sets    Functional Squat Limitations first set without wt; 2nd set lifting 15# chest to black mat (simulate lifting grandbaby)    Walking with Sports Cord retro gait with orange XTS cord with intermittent hand held assist x 10 reps - cues for increased wt into RLE.      Other Standing Knee Exercises verbally reviewed balance exercises from HEP.       Manual Therapy   Manual therapy comments Pt prone with pillow under knee, shoes off mat.     Soft tissue mobilization STM to Rt hamstring and calf.     Passive ROM into  Rt knee ext, held 20 sec x 5 reps                     PT Short Term Goals - 08/25/19 1222      PT SHORT TERM GOAL #1   Title Patient to demonstrate improved gait pattern with walker progressing to cane as tolerated    Time 6    Period Weeks    Status Achieved    Target Date 09/04/19      PT SHORT TERM GOAL #2   Title Increase AROM Rt knee to 110 deg flexion and (-) 5 deg extension or better    Time 6    Period Weeks    Status Partially Met    Target Date 09/04/19      PT SHORT TERM GOAL #3   Title Independent in initial HEP    Time 6    Period Weeks    Status Achieved    Target Date 09/04/19             PT Long Term Goals - 08/25/19 1223      PT LONG TERM GOAL #1   Title Improve Rt knee extension to (-) 3 to 0 degrees and knee flexion to 120 deg    Time 12    Period Weeks    Status On-going      PT LONG TERM GOAL #2   Title Increase strength bilat hips to 5/5 in hip extension and abduction    Time 12    Period Weeks    Status Partially Met      PT LONG TERM GOAL #3   Title Independent in ambulation for community distances with no assistive device    Time 12    Period Weeks    Status On-going      PT LONG TERM GOAL #4   Time 12    Period Weeks    Status On-going      PT LONG TERM GOAL #5   Title Improve FOTO to </= 55% limitation    Time 12    Period Weeks    Status Achieved  Plan - 09/15/19 1317    Clinical Impression Statement Continued soreness in Rt posterior knee; increased with terminal knee ext in standing.  Pt is point tender to the touch over posterior knee, as well as over the length of Rt hamstrings.  Some increase in soreness with exercises today; pt declined modalities as she will use ice/heat/TENS at home.  Pt progressing gradually towards remaining goals.    Rehab Potential Good    PT Frequency 2x / week    PT Duration 12 weeks    PT Treatment/Interventions Patient/family education;ADLs/Self Care Home  Management;Cryotherapy;Electrical Stimulation;Iontophoresis 86m/ml Dexamethasone;Moist Heat;Ultrasound;Gait training;Stair training;Functional mobility training;Therapeutic activities;Therapeutic exercise;Balance training;Neuromuscular re-education;Manual techniques;Passive range of motion;Dry needling;Taping    PT Next Visit Plan Review balance activities, work on normalizing COG, functional strengthening and gait.  Progress to carrying wt.    PT Home Exercise Plan [id number]          Patient will benefit from skilled therapeutic intervention in order to improve the following deficits and impairments:  Decreased range of motion, Difficulty walking, Abnormal gait, Pain, Decreased activity tolerance, Decreased balance, Impaired flexibility, Decreased mobility, Decreased strength, Increased edema  Visit Diagnosis: Acute pain of right knee  Muscle weakness (generalized)  Other symptoms and signs involving the musculoskeletal system  Abnormal gait     Problem List Patient Active Problem List   Diagnosis Date Noted  . OA (osteoarthritis) of knee 07/21/2019  . Hammer toe of left foot 07/23/2018  . Epigastric pain 07/23/2018  . Onychomycosis 07/23/2018  . Bilateral primary osteoarthritis of knee 03/05/2018  . Gout 03/05/2018  . Chronic pain of both knees 03/05/2018  . Status post laparoscopic sleeve gastrectomy 03/04/2018  . Obesity 05/23/2010  . LEG CRAMPS 07/17/2009  . Constipation 06/21/2009  . FATTY LIVER DISEASE 09/12/2007  . Essential hypertension 05/16/2007  . ARTHRITIS 05/16/2007  . DIVERTICULOSIS, COLON 10/18/2006  . HYPERCHOLESTEROLEMIA 09/05/2006  . Factor V deficiency ([place] 09/05/2006   JKerin Perna PTA 09/15/19 1:23 PM  [facility] [address]Phone: [phone]  Fax:  [phone] Name: Amanda GRIEVESMRN: [id number] of Birth: [birth year]

## 2019-09-18 ENCOUNTER — Other Ambulatory Visit: Payer: Self-pay

## 2019-09-18 ENCOUNTER — Ambulatory Visit (INDEPENDENT_AMBULATORY_CARE_PROVIDER_SITE_OTHER): Payer: No Typology Code available for payment source | Admitting: Physical Therapy

## 2019-09-18 DIAGNOSIS — M25561 Pain in right knee: Secondary | ICD-10-CM | POA: Diagnosis not present

## 2019-09-18 DIAGNOSIS — R269 Unspecified abnormalities of gait and mobility: Secondary | ICD-10-CM | POA: Diagnosis not present

## 2019-09-18 DIAGNOSIS — M6281 Muscle weakness (generalized): Secondary | ICD-10-CM

## 2019-09-18 DIAGNOSIS — R29898 Other symptoms and signs involving the musculoskeletal system: Secondary | ICD-10-CM | POA: Diagnosis not present

## 2019-09-18 MED FILL — ALLOPURINOL 100 MG TABS: 100 | 90 days supply | Qty: 90 | Fill #2

## 2019-09-18 NOTE — Therapy (Signed)
[place], [place], [address] Phone: [phone]   Fax:  [phone]  Physical Therapy Treatment  Patient Details  Name: Amanda [name] MRN: [id number] Date of Birth: 1955-04-30 Referring Provider (PT): Dr Gaynelle Arabian   Encounter Date: 09/18/2019   PT End of Session - 09/18/19 1149    Visit Number 16    Number of Visits 24    Date for PT Re-Evaluation 10/16/19    PT Start Time 1147    PT Stop Time 1230    PT Time Calculation (min) 43 min           Past Medical History:  Diagnosis Date  . Allergy    seasonal  . Arthritis    all over  . Blood dyscrasia    Carrys trait for Leiden Factor five never had any issues . Yhe only reason she was tested was because her mother had it  . CPAP (continuous positive airway pressure) dependence    8 cm water w/ heated humidifier  . Diabetes mellitus without complication ([place])    Pre surgery - Gastric sx 2014 Roun-Y no diabetes since  no meds  . Fatty liver 4-08   by [place]  . Gout   . Hip pain   . Hypertension   . Knee pain   . Obesity   . Pneumonia   . Recurrent UTI   . Sleep apnea    had roux en y lost 140lbs no longer needs cpap    Past Surgical History:  Procedure Laterality Date  . APPENDECTOMY  1977  . [place]   one  . CHOLECYSTECTOMY  1977  . [place]  . reduction mammoplasty  1985  . ROUX-EN-Y GASTRIC BYPASS  2014  . TOTAL KNEE ARTHROPLASTY Right 07/21/2019   Procedure: TOTAL KNEE ARTHROPLASTY;  Surgeon: Gaynelle Arabian, MD;  [facility]: [facility];  Service: Orthopedics;  Laterality: Right;  74mn    There were no vitals filed for this visit.   Subjective Assessment - 09/18/19 1153    Subjective Pt reports that her Rt knee is still sore, but hurting a little less.  She was able to ride bike at [employer] for 8 min and complete knee ext and leg press machines.  She walked a little  over 2.5 miles over course of day.    Patient Stated Goals rehab the knee and return to normal functional and work activities    Currently in Pain? Yes    Pain Score 3     Pain Location Knee    Pain Orientation Lateral;Right    Pain Descriptors / Indicators Sore    Aggravating Factors  overstretching    Pain Relieving Factors ice, heat, massage.              [facility] CenterPT Assessment - 09/18/19 0001      Assessment   Medical Diagnosis Rt TKA    Referring Provider (PT) Dr FGaynelle Arabian   Onset Date/Surgical Date 07/21/19   arthritic changes x several years    Hand Dominance Right    Next MD Visit 09/30/19    Prior Therapy here for hip pain       AROM   Right Knee Extension -14   in supine,  -7 in long sitting with quad set   Right Knee Flexion [place]  Adult PT Treatment/Exercise - 09/18/19 0001      Knee/Hip Exercises: Stretches   Passive Hamstring Stretch Right;2 reps;60 seconds    Hip Flexor Stretch Right;3 reps;30 seconds    Hip Flexor Stretch Limitations seated and Thomas position     Press photographer 2 reps;60 seconds;Right    Other Knee/Hip Stretches Prolonged Rt hamstring stretch in long stting with overpressure from pt x 3 min       Knee/Hip Exercises: Aerobic   Recumbent Bike L1-3: 5 min       Knee/Hip Exercises: Machines for Strengthening   Cybex Leg Press 4 plates, 10 reps RLE with 5 sec hold in ext.       Knee/Hip Exercises: Standing   Forward Step Up Right;1 set;10 reps;Hand Hold: 2;Step Height: 6"    Forward Step Up Limitations eccentric lowering down      Knee/Hip Exercises: Seated   Sit to Sand 1 set;10 reps;without UE support   heavy cues for wt shift and form. Increased time to complete with encouragement.     Modalities   Modalities --   pt declined     Manual Therapy   Soft tissue mobilization STM to Rt ant tib and distal lateral thigh (area of discomfort) to assist in improved exercise tolerance.     Passive ROM into Rt knee ext,  held 20 sec x 5 reps                     PT Short Term Goals - 08/25/19 1222      PT SHORT TERM GOAL #1   Title Patient to demonstrate improved gait pattern with walker progressing to cane as tolerated    Time 6    Period Weeks    Status Achieved    Target Date 09/04/19      PT SHORT TERM GOAL #2   Title Increase AROM Rt knee to 110 deg flexion and (-) 5 deg extension or better    Time 6    Period Weeks    Status Partially Met    Target Date 09/04/19      PT SHORT TERM GOAL #3   Title Independent in initial HEP    Time 6    Period Weeks    Status Achieved    Target Date 09/04/19             PT Long Term Goals - 08/25/19 1223      PT LONG TERM GOAL #1   Title Improve Rt knee extension to (-) 3 to 0 degrees and knee flexion to 120 deg    Time 12    Period Weeks    Status On-going      PT LONG TERM GOAL #2   Title Increase strength bilat hips to 5/5 in hip extension and abduction    Time 12    Period Weeks    Status Partially Met      PT LONG TERM GOAL #3   Title Independent in ambulation for community distances with no assistive device    Time 12    Period Weeks    Status On-going      PT LONG TERM GOAL #4   Time 12    Period Weeks    Status On-going      PT LONG TERM GOAL #5   Title Improve FOTO to </= 55% limitation    Time 12    Period Weeks    Status Achieved  Plan - 09/18/19 1316    Clinical Impression Statement Pt continues with limited Rt knee ext ROM and decreased tolerance for PROM into ext.  Pt able to complete sit to/from stand without UE support, although challenging.  Pt declined modalities; will use at home if needed.  Pt making gradual progress towards remaining goals.    Rehab Potential Good    PT Frequency 2x / week    PT Duration 12 weeks    PT Treatment/Interventions Patient/family education;ADLs/Self Care Home Management;Cryotherapy;Electrical Stimulation;Iontophoresis 25m/ml Dexamethasone;Moist  Heat;Ultrasound;Gait training;Stair training;Functional mobility training;Therapeutic activities;Therapeutic exercise;Balance training;Neuromuscular re-education;Manual techniques;Passive range of motion;Dry needling;Taping    PT Next Visit Plan Rt knee ext ROM, functional strengthening and gait.  Pt interested in DN if approved by surgeon.    PT Home Exercise Plan [id number]    Consulted and Agree with Plan of Care Patient           Patient will benefit from skilled therapeutic intervention in order to improve the following deficits and impairments:  Decreased range of motion, Difficulty walking, Abnormal gait, Pain, Decreased activity tolerance, Decreased balance, Impaired flexibility, Decreased mobility, Decreased strength, Increased edema  Visit Diagnosis: Acute pain of right knee  Muscle weakness (generalized)  Other symptoms and signs involving the musculoskeletal system  Abnormal gait     Problem List Patient Active Problem List   Diagnosis Date Noted  . OA (osteoarthritis) of knee 07/21/2019  . Hammer toe of left foot 07/23/2018  . Epigastric pain 07/23/2018  . Onychomycosis 07/23/2018  . Bilateral primary osteoarthritis of knee 03/05/2018  . Gout 03/05/2018  . Chronic pain of both knees 03/05/2018  . Status post laparoscopic sleeve gastrectomy 03/04/2018  . Obesity 05/23/2010  . LEG CRAMPS 07/17/2009  . Constipation 06/21/2009  . FATTY LIVER DISEASE 09/12/2007  . Essential hypertension 05/16/2007  . ARTHRITIS 05/16/2007  . DIVERTICULOSIS, COLON 10/18/2006  . HYPERCHOLESTEROLEMIA 09/05/2006  . Factor V deficiency ([place] 09/05/2006   JKerin Perna PTA 09/18/19 1:28 PM  [place] [address]Phone: [phone]  Fax:  [phone] Name: Amanda COLLISONMRN: [id number] of Birth: 1Dec 02, 1957

## 2019-09-22 ENCOUNTER — Other Ambulatory Visit: Payer: Self-pay

## 2019-09-22 ENCOUNTER — Ambulatory Visit (INDEPENDENT_AMBULATORY_CARE_PROVIDER_SITE_OTHER): Payer: No Typology Code available for payment source | Admitting: Rehabilitative and Restorative Service Providers"

## 2019-09-22 ENCOUNTER — Encounter: Payer: Self-pay | Admitting: Rehabilitative and Restorative Service Providers"

## 2019-09-22 DIAGNOSIS — M6281 Muscle weakness (generalized): Secondary | ICD-10-CM

## 2019-09-22 DIAGNOSIS — M25561 Pain in right knee: Secondary | ICD-10-CM | POA: Diagnosis not present

## 2019-09-22 DIAGNOSIS — R29898 Other symptoms and signs involving the musculoskeletal system: Secondary | ICD-10-CM

## 2019-09-22 DIAGNOSIS — R269 Unspecified abnormalities of gait and mobility: Secondary | ICD-10-CM | POA: Diagnosis not present

## 2019-09-22 NOTE — Patient Instructions (Signed)

## 2019-09-22 NOTE — Therapy (Signed)
[place], [place], [address] Phone: [phone]   Fax:  [phone]  Physical Therapy Treatment  Patient Details  Name: Amanda [name] MRN: [id number] Date of Birth: January 28, 1956 Referring Provider (PT): Dr Gaynelle Arabian   Encounter Date: 09/22/2019   PT End of Session - 09/22/19 0718    Visit Number 17    Number of Visits 24    Date for PT Re-Evaluation 10/16/19    PT Start Time 0715    PT Stop Time 0802   MH end of treatment   PT Time Calculation (min) 47 min    Activity Tolerance Patient tolerated treatment well           Past Medical History:  Diagnosis Date  . Allergy    seasonal  . Arthritis    all over  . Blood dyscrasia    Carrys trait for Leiden Factor five never had any issues . Yhe only reason she was tested was because her mother had it  . CPAP (continuous positive airway pressure) dependence    8 cm water w/ heated humidifier  . Diabetes mellitus without complication ([place])    Pre surgery - Gastric sx 2014 Roun-Y no diabetes since  no meds  . Fatty liver 4-08   by [place]  . Gout   . Hip pain   . Hypertension   . Knee pain   . Obesity   . Pneumonia   . Recurrent UTI   . Sleep apnea    had roux en y lost 140lbs no longer needs cpap    Past Surgical History:  Procedure Laterality Date  . APPENDECTOMY  1977  . [place]   one  . CHOLECYSTECTOMY  1977  . [place]  . reduction mammoplasty  1985  . ROUX-EN-Y GASTRIC BYPASS  2014  . TOTAL KNEE ARTHROPLASTY Right 07/21/2019   Procedure: TOTAL KNEE ARTHROPLASTY;  Surgeon: Gaynelle Arabian, MD;  Location: [facility];  Service: Orthopedics;  Laterality: Right;  77mn    There were no vitals filed for this visit.   Subjective Assessment - 09/22/19 0719    Subjective Patient reports that she is having trouble getting the knee straight. She is back at the gym and working on the bike  and machines.    Currently in Pain? Yes    Pain Score 2     Pain Location Knee    Pain Orientation Right;Lateral    Pain Descriptors / Indicators Sore    Pain Type Surgical pain    Pain Onset More than a month ago    Pain Frequency Intermittent                             [facility] Adult PT Treatment/Exercise - 09/22/19 0001      Therapeutic Activites    Therapeutic Activities --   myofacial ball release work hamstring sitting      Knee/Hip Exercises: Stretches   Passive Hamstring Stretch Right;2 reps;60 seconds    Other Knee/Hip Stretches standing knee extension with ball posterior knee 10 sec x 10 reps       Knee/Hip Exercises: Aerobic   Recumbent Bike L3 x 5 min       Knee/Hip Exercises: Supine   Quad Sets AROM;Strengthening;Right;10 reps   10 sec    Quad Sets Limitations heel resting on bolster  Moist Heat Therapy   Number Minutes Moist Heat 10 Minutes    Moist Heat Location Knee   Rt      Cryotherapy   Number Minutes Cryotherapy 10 Minutes    Cryotherapy Location Knee   Rt    Type of Cryotherapy Ice pack      Manual Therapy   Manual therapy comments skilled palpation to assess soft tissue response to DN and manual work     Soft tissue mobilization deep tissue work through calf and hamstrings     Passive ROM into Rt knee ext, held 20 sec x 5 reps                   PT Education - 09/22/19 0800    Education Details DN    Person(s) Educated Patient    Methods Explanation;Handout    Comprehension Verbalized understanding            PT Short Term Goals - 08/25/19 1222      PT SHORT TERM GOAL #1   Title Patient to demonstrate improved gait pattern with walker progressing to cane as tolerated    Time 6    Period Weeks    Status Achieved    Target Date 09/04/19      PT SHORT TERM GOAL #2   Title Increase AROM Rt knee to 110 deg flexion and (-) 5 deg extension or better    Time 6    Period Weeks    Status Partially Met     Target Date 09/04/19      PT SHORT TERM GOAL #3   Title Independent in initial HEP    Time 6    Period Weeks    Status Achieved    Target Date 09/04/19             PT Long Term Goals - 08/25/19 1223      PT LONG TERM GOAL #1   Title Improve Rt knee extension to (-) 3 to 0 degrees and knee flexion to 120 deg    Time 12    Period Weeks    Status On-going      PT LONG TERM GOAL #2   Title Increase strength bilat hips to 5/5 in hip extension and abduction    Time 12    Period Weeks    Status Partially Met      PT LONG TERM GOAL #3   Title Independent in ambulation for community distances with no assistive device    Time 12    Period Weeks    Status On-going      PT LONG TERM GOAL #4   Time 12    Period Weeks    Status On-going      PT LONG TERM GOAL #5   Title Improve FOTO to </= 55% limitation    Time 12    Period Weeks    Status Achieved                 Plan - 09/22/19 0720    Clinical Impression Statement Patient continues to have limitations in Rt knee extension. Trial of DN for hamstrings and calf followed by work on knee extension.    Rehab Potential Good    PT Frequency 2x / week    PT Duration 12 weeks    PT Treatment/Interventions Patient/family education;ADLs/Self Care Home Management;Cryotherapy;Electrical Stimulation;Iontophoresis 68m/ml Dexamethasone;Moist Heat;Ultrasound;Gait training;Stair training;Functional mobility training;Therapeutic activities;Therapeutic exercise;Balance training;Neuromuscular re-education;Manual techniques;Passive range of motion;Dry needling;Taping  PT Next Visit Plan Rt knee ext ROM, functional strengthening and gait; assess response to DN    PT Home Exercise Plan [email or web address]    Consulted and Agree with Plan of Care Patient           Patient will benefit from skilled therapeutic intervention in order to improve the following deficits and impairments:     Visit Diagnosis: Acute pain of right knee  Muscle  weakness (generalized)  Other symptoms and signs involving the musculoskeletal system  Abnormal gait     Problem List Patient Active Problem List   Diagnosis Date Noted  . OA (osteoarthritis) of knee 07/21/2019  . Hammer toe of left foot 07/23/2018  . Epigastric pain 07/23/2018  . Onychomycosis 07/23/2018  . Bilateral primary osteoarthritis of knee 03/05/2018  . Gout 03/05/2018  . Chronic pain of both knees 03/05/2018  . Status post laparoscopic sleeve gastrectomy 03/04/2018  . Obesity 05/23/2010  . LEG CRAMPS 07/17/2009  . Constipation 06/21/2009  . FATTY LIVER DISEASE 09/12/2007  . Essential hypertension 05/16/2007  . ARTHRITIS 05/16/2007  . DIVERTICULOSIS, COLON 10/18/2006  . HYPERCHOLESTEROLEMIA 09/05/2006  . Factor V deficiency ([place]) 09/05/2006    Nonna Renninger Nilda Simmer PT, MPH  09/22/2019, 8:00 AM  [facility] [place], [place], [address] Phone: [phone]   Fax:  [phone]  Name: Amanda [name] MRN: [id number] Date of Birth: 1955/10/01

## 2019-09-24 ENCOUNTER — Ambulatory Visit (INDEPENDENT_AMBULATORY_CARE_PROVIDER_SITE_OTHER): Payer: No Typology Code available for payment source | Admitting: Physical Therapy

## 2019-09-24 ENCOUNTER — Other Ambulatory Visit: Payer: Self-pay

## 2019-09-24 DIAGNOSIS — R29898 Other symptoms and signs involving the musculoskeletal system: Secondary | ICD-10-CM

## 2019-09-24 DIAGNOSIS — M6281 Muscle weakness (generalized): Secondary | ICD-10-CM

## 2019-09-24 DIAGNOSIS — R269 Unspecified abnormalities of gait and mobility: Secondary | ICD-10-CM

## 2019-09-24 DIAGNOSIS — M25561 Pain in right knee: Secondary | ICD-10-CM

## 2019-09-25 NOTE — Therapy (Signed)
[place], [place], [address] Phone: [phone]   Fax:  [phone]  Patient Details  Name: Amanda [name] MRN: [id number] Date of Birth: 12/04/1955 Referring Provider:  Gaynelle Arabian, MD  Encounter Date: 09/24/2019  Patient arrived late to appointment.  Appointment rescheduled and patient not seen.  Encounter opened in error.   Kerin Perna, PTA 09/25/19 5:46 PM  [facility] [place], [place], [address] Phone: [phone]   Fax:  [phone]

## 2019-09-29 ENCOUNTER — Encounter: Payer: No Typology Code available for payment source | Admitting: Rehabilitative and Restorative Service Providers"

## 2019-10-01 ENCOUNTER — Other Ambulatory Visit: Payer: Self-pay

## 2019-10-01 ENCOUNTER — Ambulatory Visit (INDEPENDENT_AMBULATORY_CARE_PROVIDER_SITE_OTHER): Payer: No Typology Code available for payment source | Admitting: Physical Therapy

## 2019-10-01 DIAGNOSIS — R29898 Other symptoms and signs involving the musculoskeletal system: Secondary | ICD-10-CM

## 2019-10-01 DIAGNOSIS — M6281 Muscle weakness (generalized): Secondary | ICD-10-CM

## 2019-10-01 DIAGNOSIS — M25561 Pain in right knee: Secondary | ICD-10-CM

## 2019-10-01 DIAGNOSIS — R269 Unspecified abnormalities of gait and mobility: Secondary | ICD-10-CM | POA: Diagnosis not present

## 2019-10-01 NOTE — Therapy (Signed)
[place], [place], [address] Phone: [phone]   Fax:  [phone]  Physical Therapy Treatment  Patient Details  Name: Amanda [name] MRN: [id number] Date of Birth: 10-16-1955 Referring Provider (PT): Dr Gaynelle Arabian   Encounter Date: 10/01/2019   PT End of Session - 10/01/19 0715    Visit Number 18    Number of Visits 24    Date for PT Re-Evaluation 10/16/19    PT Start Time 0713    PT Stop Time 0755    PT Time Calculation (min) 42 min           Past Medical History:  Diagnosis Date  . Allergy    seasonal  . Arthritis    all over  . Blood dyscrasia    Carrys trait for Leiden Factor five never had any issues . Yhe only reason she was tested was because her mother had it  . CPAP (continuous positive airway pressure) dependence    8 cm water w/ heated humidifier  . Diabetes mellitus without complication ([place])    Pre surgery - Gastric sx 2014 Roun-Y no diabetes since  no meds  . Fatty liver 4-08   by [place]  . Gout   . Hip pain   . Hypertension   . Knee pain   . Obesity   . Pneumonia   . Recurrent UTI   . Sleep apnea    had roux en y lost 140lbs no longer needs cpap    Past Surgical History:  Procedure Laterality Date  . APPENDECTOMY  1977  . [place]   one  . CHOLECYSTECTOMY  1977  . [place]  . reduction mammoplasty  1985  . ROUX-EN-Y GASTRIC BYPASS  2014  . TOTAL KNEE ARTHROPLASTY Right 07/21/2019   Procedure: TOTAL KNEE ARTHROPLASTY;  Surgeon: Gaynelle Arabian, MD;  Location: [facility];  Service: Orthopedics;  Laterality: Right;  23mn    There were no vitals filed for this visit.   Subjective Assessment - 10/01/19 0715    Subjective Saw MD yesterday and he was pleased with progress including her bending and straightening - "it wil come" per PA. She returns to work 10/13/19 working 2 weeks only half day.    Currently in  Pain? Yes    Pain Score 1     Pain Location Knee    Pain Orientation Right;Lateral    Pain Descriptors / Indicators Sore    Pain Type Surgical pain;Chronic pain              [facility] PT Assessment - 10/01/19 0001      Assessment   Medical Diagnosis Rt TKA    Referring Provider (PT) Dr FGaynelle Arabian   Onset Date/Surgical Date 07/21/19   arthritic changes x several years    Hand Dominance Right    Next MD Visit 09/30/19    Prior Therapy here for hip pain       AROM   Right Knee Extension -11      Flexibility   Soft Tissue Assessment /Muscle Length yes    Quadriceps RLE 90 deg LLE 95 deg            [facility] Adult PT Treatment/Exercise - 10/01/19 0001      Knee/Hip Exercises: Stretches   Passive Hamstring Stretch Right;60 seconds;3 reps   long sitting, overpressure from pt.   Passive Hamstring Stretch Limitations  cues to correct hip position to more neutral.     Quad Stretch Right;2 reps;30 seconds    Piriformis Stretch Right;1 rep;60 seconds      Knee/Hip Exercises: Aerobic   Tread Mill 3 min, at 1.71mh, up to 3% incline    Recumbent Bike L3 x 5 min       Knee/Hip Exercises: Machines for Strengthening   Cybex Knee Extension 2 plates, BLE up, lowering with RLE x 5 rep, repeated with 1 plate       Knee/Hip Exercises: Prone   Hamstring Curl 1 set;5 reps    Prone Knee Hang Limitations completed intermittently during STM to Rt posterior LE.       Modalities   Modalities --   pt declined; will use at home.      Manual Therapy   Soft tissue mobilization IASTM and STM to Rt proximal calf, mid to distal ITB, (STM to Rt TFL) and Rt hamstrings to decrease fascial restrictions and improve ROM.    Passive ROM into Rt knee ext x 20 sec                     PT Short Term Goals - 08/25/19 1222      PT SHORT TERM GOAL #1   Title Patient to demonstrate improved gait pattern with walker progressing to cane as tolerated    Time 6    Period Weeks    Status Achieved     Target Date 09/04/19      PT SHORT TERM GOAL #2   Title Increase AROM Rt knee to 110 deg flexion and (-) 5 deg extension or better    Time 6    Period Weeks    Status Partially Met    Target Date 09/04/19      PT SHORT TERM GOAL #3   Title Independent in initial HEP    Time 6    Period Weeks    Status Achieved    Target Date 09/04/19             PT Long Term Goals - 08/25/19 1223      PT LONG TERM GOAL #1   Title Improve Rt knee extension to (-) 3 to 0 degrees and knee flexion to 120 deg    Time 12    Period Weeks    Status On-going      PT LONG TERM GOAL #2   Title Increase strength bilat hips to 5/5 in hip extension and abduction    Time 12    Period Weeks    Status Partially Met      PT LONG TERM GOAL #3   Title Independent in ambulation for community distances with no assistive device    Time 12    Period Weeks    Status On-going      PT LONG TERM GOAL #4   Time 12    Period Weeks    Status On-going      PT LONG TERM GOAL #5   Title Improve FOTO to </= 55% limitation    Time 12    Period Weeks    Status Achieved                 Plan - 10/01/19 09629   Clinical Impression Statement Pt presents with continued limitation in Rt knee ROM.  Rt knee ext ROM measurement similar to last reading. Limited tolerance for overpressure into knee ext by therapist, so  had pt provide overpressure.  Tightness noted in Rt biceps femoris, TFL, prox lateral gastroc, and distal ITB; some improvement with STM.  Encouraged pt to continue self massage and long stretches into knee ext.  Pt making gradual progress towards remaining goals.    Rehab Potential Good    PT Frequency 2x / week    PT Duration 12 weeks    PT Treatment/Interventions Patient/family education;ADLs/Self Care Home Management;Cryotherapy;Electrical Stimulation;Iontophoresis 77m/ml Dexamethasone;Moist Heat;Ultrasound;Gait training;Stair training;Functional mobility training;Therapeutic  activities;Therapeutic exercise;Balance training;Neuromuscular re-education;Manual techniques;Passive range of motion;Dry needling;Taping    PT Next Visit Plan assess readiness to d/c vs hold vs additional visits.    PT Home Exercise Plan [email or web address]    Consulted and Agree with Plan of Care Patient           Patient will benefit from skilled therapeutic intervention in order to improve the following deficits and impairments:  Decreased range of motion, Difficulty walking, Abnormal gait, Pain, Decreased activity tolerance, Decreased balance, Impaired flexibility, Decreased mobility, Decreased strength, Increased edema  Visit Diagnosis: Acute pain of right knee  Muscle weakness (generalized)  Other symptoms and signs involving the musculoskeletal system  Abnormal gait     Problem List Patient Active Problem List   Diagnosis Date Noted  . OA (osteoarthritis) of knee 07/21/2019  . Hammer toe of left foot 07/23/2018  . Epigastric pain 07/23/2018  . Onychomycosis 07/23/2018  . Bilateral primary osteoarthritis of knee 03/05/2018  . Gout 03/05/2018  . Chronic pain of both knees 03/05/2018  . Status post laparoscopic sleeve gastrectomy 03/04/2018  . Obesity 05/23/2010  . LEG CRAMPS 07/17/2009  . Constipation 06/21/2009  . FATTY LIVER DISEASE 09/12/2007  . Essential hypertension 05/16/2007  . ARTHRITIS 05/16/2007  . DIVERTICULOSIS, COLON 10/18/2006  . HYPERCHOLESTEROLEMIA 09/05/2006  . Factor V deficiency ([place] 09/05/2006    CShelbie Hutching8/25/2021, 10:48 AM  [facility] [address]Phone: [phone]  Fax:  [phone] Name: Amanda BOULDINMRN: [id number] of Birth: [birth year]

## 2019-10-06 ENCOUNTER — Other Ambulatory Visit: Payer: Self-pay

## 2019-10-06 ENCOUNTER — Ambulatory Visit (INDEPENDENT_AMBULATORY_CARE_PROVIDER_SITE_OTHER): Payer: No Typology Code available for payment source | Admitting: Physical Therapy

## 2019-10-06 DIAGNOSIS — M25561 Pain in right knee: Secondary | ICD-10-CM | POA: Diagnosis not present

## 2019-10-06 DIAGNOSIS — M6281 Muscle weakness (generalized): Secondary | ICD-10-CM | POA: Diagnosis not present

## 2019-10-06 DIAGNOSIS — R29898 Other symptoms and signs involving the musculoskeletal system: Secondary | ICD-10-CM

## 2019-10-06 NOTE — Therapy (Addendum)
[place], [place], [address] Phone: [phone]   Fax:  [phone]  Physical Therapy Treatment  Patient Details  Name: Amanda [name] MRN: [id number] Date of Birth: [birth year] Referring Provider (PT): Dr Gaynelle Arabian   Encounter Date: 10/06/2019   PT End of Session - 10/06/19 0724    Visit Number 19    Number of Visits 24    Date for PT Re-Evaluation 10/16/19    PT Start Time 0719    PT Stop Time 0758    PT Time Calculation (min) 39 min           Past Medical History:  Diagnosis Date  . Allergy    seasonal  . Arthritis    all over  . Blood dyscrasia    Carrys trait for Leiden Factor five never had any issues . Yhe only reason she was tested was because her mother had it  . CPAP (continuous positive airway pressure) dependence    8 cm water w/ heated humidifier  . Diabetes mellitus without complication ([place])    Pre surgery - Gastric sx 2014 Roun-Y no diabetes since  no meds  . Fatty liver 4-08   by [place]  . Gout   . Hip pain   . Hypertension   . Knee pain   . Obesity   . Pneumonia   . Recurrent UTI   . Sleep apnea    had roux en y lost 140lbs no longer needs cpap    Past Surgical History:  Procedure Laterality Date  . APPENDECTOMY  1977  . [place]   one  . CHOLECYSTECTOMY  1977  . [place]  . reduction mammoplasty  1985  . ROUX-EN-Y GASTRIC BYPASS  2014  . TOTAL KNEE ARTHROPLASTY Right 07/21/2019   Procedure: TOTAL KNEE ARTHROPLASTY;  Surgeon: Gaynelle Arabian, MD;  Location: [facility];  Service: Orthopedics;  Laterality: Right;  74mn    There were no vitals filed for this visit.   Subjective Assessment - 10/06/19 0721    Subjective "I think my knee is what it is".  Pt reports some clicking in Lt knee, that comes and goes.  She did a lot of walking this weekend.    Pertinent History arthritis; Fx Lt patella 2018; LBP and  Rt hip pain; HTN    Patient Stated Goals rehab the knee and return to normal functional and work activities    Currently in Pain? Yes    Pain Score 2     Pain Location Knee    Pain Orientation Right;Lateral    Pain Descriptors / Indicators Sore    Aggravating Factors  overstretching    Pain Relieving Factors ice, heat, massage.              [facility]PT [facility] - 10/06/19 0001      Assessment   Medical Diagnosis Rt TKA    Referring Provider (PT) Dr FGaynelle Arabian   Onset Date/Surgical Date 07/21/19   arthritic changes x several years    Hand Dominance Right    Next MD Visit PRN    Prior Therapy here for hip pain       AROM   Right Knee Extension -12   in supine, -7 in long sitting   Right Knee Flexion 121      Strength   Right Hip Flexion 5/5    Right Hip Extension  5/5    Right Hip ABduction 5/5    Right Knee Flexion 5/5    Right Knee Extension 5/5      Flexibility   Quadriceps RLE 95 deg             [facility] Adult PT Treatment/Exercise - 10/06/19 0001      Self-Care   Other Self-Care Comments  Pt educated on IASTM to bilat quads to decrease fascial restrictions; pt verbalized understanding with demo.       Knee/Hip Exercises: Stretches   Passive Hamstring Stretch Right;2 reps;30 seconds    Quad Stretch Left;Right;2 reps;30 seconds   prone with strap   ITB Stretch Right;2 reps;20 seconds   supine with strap   Gastroc Stretch Both;2 reps;30 seconds      Knee/Hip Exercises: Aerobic   Recumbent Bike L2 x 28mn       Knee/Hip Exercises: Standing   Terminal Knee Extension Right;1 set;5 reps   for HEP review     Knee/Hip Exercises: Supine   Quad Sets --   verbally reviewed   STechnical brewerRight;5 reps    Heel Slides Right   1 rep for measurement and review   Straight Leg Raises Right;5 reps   HEP review     Manual Therapy   Manual Therapy Soft tissue mobilization;Taping    Soft tissue mobilization IASTM to bilat quad, Rt lateral distal knee - to decrease  fascial restictions and improve ROM    Kinesiotex CIT sales professionalI strip of sensitive skin tape applied to Rt lateral knee (at joint line) with 15% stretch, I strip of sensitive skin Rock tape applied to LUGI Corporationquad tendon with 50% stretch - both applied to increase proprioception, decompress tissue and decrease pain.           Verbally reviewed all of HEP.  Hand wrote notes regarding kinesiology tape(including safe removal), knee ext and leg press machines for gym, IASTM/self massage.       PT Short Term Goals - 08/25/19 1222      PT SHORT TERM GOAL #1   Title Patient to demonstrate improved gait pattern with walker progressing to cane as tolerated    Time 6    Period Weeks    Status Achieved    Target Date 09/04/19      PT SHORT TERM GOAL #2   Title Increase AROM Rt knee to 110 deg flexion and (-) 5 deg extension or better    Time 6    Period Weeks    Status Partially Met    Target Date 09/04/19      PT SHORT TERM GOAL #3   Title Independent in initial HEP    Time 6    Period Weeks    Status Achieved    Target Date 09/04/19             PT Long Term Goals - 10/06/19 1102      PT LONG TERM GOAL #1   Title Improve Rt knee extension to (-) 3 to 0 degrees and knee flexion to 120 deg    Time 12    Period Weeks    Status Partially Met      PT LONG TERM GOAL #2   Title Increase strength bilat hips to 5/5 in hip extension and abduction    Time 12    Period Weeks    Status Achieved  PT LONG TERM GOAL #3   Title Independent in ambulation for community distances with no assistive device    Time 12    Period Weeks    Status Achieved      PT LONG TERM GOAL #4   Time 12      PT LONG TERM GOAL #5   Title Improve FOTO to </= 55% limitation    Time 12    Period Weeks    Status Achieved                 Plan - 10/06/19 1258    Clinical Impression Statement Pt demonstrated improved Rt knee flexion and LE strength.  Reviewed  HEP, added stretches and instructed pt in IASTM/STM for self care. Pt has partially met her goals and verbalized readiness to d/c to HEP.    Rehab Potential Good    PT Frequency 2x / week    PT Duration 12 weeks    PT Treatment/Interventions Patient/family education;ADLs/Self Care Home Management;Cryotherapy;Electrical Stimulation;Iontophoresis 4m/ml Dexamethasone;Moist Heat;Ultrasound;Gait training;Stair training;Functional mobility training;Therapeutic activities;Therapeutic exercise;Balance training;Neuromuscular re-education;Manual techniques;Passive range of motion;Dry needling;Taping    PT Next Visit Plan spoke to supervising PT; will d/c to HEP.    PT Home Exercise Plan [email or web address]    Consulted and Agree with Plan of Care Patient           Patient will benefit from skilled therapeutic intervention in order to improve the following deficits and impairments:  Decreased range of motion, Difficulty walking, Abnormal gait, Pain, Decreased activity tolerance, Decreased balance, Impaired flexibility, Decreased mobility, Decreased strength, Increased edema  Visit Diagnosis: Acute pain of right knee  Muscle weakness (generalized)  Other symptoms and signs involving the musculoskeletal system     Problem List Patient Active Problem List   Diagnosis Date Noted  . OA (osteoarthritis) of knee 07/21/2019  . Hammer toe of left foot 07/23/2018  . Epigastric pain 07/23/2018  . Onychomycosis 07/23/2018  . Bilateral primary osteoarthritis of knee 03/05/2018  . Gout 03/05/2018  . Chronic pain of both knees 03/05/2018  . Status post laparoscopic sleeve gastrectomy 03/04/2018  . Obesity 05/23/2010  . LEG CRAMPS 07/17/2009  . Constipation 06/21/2009  . FATTY LIVER DISEASE 09/12/2007  . Essential hypertension 05/16/2007  . ARTHRITIS 05/16/2007  . DIVERTICULOSIS, COLON 10/18/2006  . HYPERCHOLESTEROLEMIA 09/05/2006  . Factor V deficiency ([place] 09/05/2006   JKerin Perna  PTA 10/06/19 1:00 PM  [place] [address] [address]Phone: [phone]  Fax:  [phone] Name: Amanda BRAGERMRN: [id number] of Birth: [birth year] PHYSICAL THERAPY DISCHARGE SUMMARY  Visits from Start of Care: 19  Current functional level related to goals / functional outcomes: Doing well and returning to work and ADL's    Remaining deficits: Continued limitations in knee ROM    Education / Equipment: HEP  Plan: Patient agrees to discharge.  Patient goals were met. Patient is being discharged due to meeting the stated rehab goals.  ?????     Celyn P. HHelene KelpPT, MPH 11/19/19 11:06 AM

## 2019-10-06 NOTE — Patient Instructions (Signed)
Access Code: [email or web address]: [email or web address]: 08/30/2021Prepared by: [facility] KernersvilleExercises  Supine Knee Flexion Self Mobilization - 2 x daily - 7 x weekly - 1 sets - 10 reps - 10 sec hold  Seated Knee Flexion Extension AROM - 2 x daily - 7 x weekly - 1 sets - 10 reps - 10 sec hold  Supine Knee Extension Strengthening - 2 x daily - 7 x weekly - 1 sets - 5 reps - 10 sec hold  Standing Terminal Knee Extension at Wall with Ball - 2 x daily - 7 x weekly - 1 sets - 5 reps - 10 sec hold  Prone Quadriceps Stretch with Strap - 1 x daily - 7 x weekly - 1 sets - 3 reps - 30 seconds hold  Hooklying Hamstring Stretch with Strap - 1 x daily - 7 x weekly - 1 sets - 3 reps - 30 seconds hold  Supine ITB Stretch with Strap - 1 x daily - 7 x weekly - 1 sets - 3 reps - 30 seconds hold

## 2019-10-25 ENCOUNTER — Encounter: Payer: Self-pay | Admitting: Oncology

## 2019-10-25 ENCOUNTER — Other Ambulatory Visit (HOSPITAL_COMMUNITY): Payer: Self-pay | Admitting: Oncology

## 2019-10-25 DIAGNOSIS — U071 COVID-19: Secondary | ICD-10-CM

## 2019-10-25 NOTE — Progress Notes (Signed)
I connected by phone with Amanda [name] to discuss the potential use of an new treatment for mild to moderate COVID-19 viral infection in non-hospitalized patients.   This patient is a age/sex that meets the FDA criteria for Emergency Use Authorization of casirivimab\imdevimab.  Has a (+) direct SARS-CoV-2 viral test result 1. Has mild or moderate COVID-19  2. Is ? 64 years of age and weighs ? 40 kg 3. Is NOT hospitalized due to COVID-19 4. Is NOT requiring oxygen therapy or requiring an increase in baseline oxygen flow rate due to COVID-19 5. Is within 10 days of symptom onset 6. Has at least one of the high risk factor(s) for progression to severe COVID-19 and/or hospitalization as defined in EUA. Specific high risk criteria : Past Medical History:  Diagnosis Date  . Allergy    seasonal  . Arthritis    all over  . Blood dyscrasia    Carrys trait for Leiden Factor five never had any issues . Yhe only reason she was tested was because her mother had it  . CPAP (continuous positive airway pressure) dependence    8 cm water w/ heated humidifier  . Diabetes mellitus without complication ([place])    Pre surgery - Gastric sx 2014 Roun-Y no diabetes since  no meds  . Fatty liver 4-08   by [place]  . Gout   . Hip pain   . Hypertension   . Knee pain   . Obesity   . Pneumonia   . Recurrent UTI   . Sleep apnea    had roux en y lost 140lbs no longer needs cpap  ?  ?    Symptom onset  10/23/2019   I have spoken and communicated the following to the patient or parent/caregiver:   1. FDA has authorized the emergency use of casirivimab\imdevimab for the treatment of mild to moderate COVID-19 in adults and pediatric patients with positive results of direct SARS-CoV-2 viral testing who are 75 years of age and older weighing at least 40 kg, and who are at high risk for progressing to severe COVID-19 and/or hospitalization.   2. The significant known and potential risks and benefits of  casirivimab\imdevimab, and the extent to which such potential risks and benefits are unknown.   3. Information on available alternative treatments and the risks and benefits of those alternatives, including clinical trials.   4. Patients treated with casirivimab\imdevimab should continue to self-isolate and use infection control measures (e.g., wear mask, isolate, social distance, avoid sharing personal items, clean and disinfect "high touch" surfaces, and frequent handwashing) according to CDC guidelines.    5. The patient or parent/caregiver has the option to accept or refuse casirivimab\imdevimab .   After reviewing this information with the patient, The patient agreed to proceed with receiving casirivimab\imdevimab infusion and will be provided a copy of the Fact sheet prior to receiving the infusion.Rulon Abide, AGNP-C [phone] ([place])

## 2019-10-26 ENCOUNTER — Ambulatory Visit (HOSPITAL_COMMUNITY)
Admission: RE | Admit: 2019-10-26 | Discharge: 2019-10-26 | Disposition: A | Discharge status: Home / Self Care | Payer: No Typology Code available for payment source | Source: Ambulatory Visit | Attending: Pulmonary Disease | Admitting: Pulmonary Disease

## 2019-10-26 ENCOUNTER — Other Ambulatory Visit (HOSPITAL_COMMUNITY): Payer: Self-pay

## 2019-10-26 DIAGNOSIS — U071 COVID-19: Secondary | ICD-10-CM | POA: Diagnosis present

## 2019-10-26 MED ORDER — FAMOTIDINE IN NACL 20-0.9 MG/50ML-% IV SOLN: 20.0000 mg | Freq: Once | INTRAVENOUS | Status: DC | PRN

## 2019-10-26 MED ORDER — EPINEPHRINE 0.3 MG/0.3ML IJ SOAJ: 0.3000 mg | Freq: Once | INTRAMUSCULAR | Status: DC | PRN

## 2019-10-26 MED ORDER — SODIUM CHLORIDE 0.9 % IV SOLN
1200.0000 mg | Freq: Once | INTRAVENOUS | Status: AC
  Administered 2019-10-26: 1200 mg via INTRAVENOUS

## 2019-10-26 MED ORDER — METHYLPREDNISOLONE SODIUM SUCC 125 MG IJ SOLR: 125.0000 mg | Freq: Once | INTRAMUSCULAR | Status: DC | PRN

## 2019-10-26 MED ORDER — SODIUM CHLORIDE 0.9 % IV SOLN: INTRAVENOUS | Status: DC | PRN

## 2019-10-26 MED ORDER — ALBUTEROL SULFATE HFA 108 (90 BASE) MCG/ACT IN AERS: 2.0000 | INHALATION_SPRAY | Freq: Once | RESPIRATORY_TRACT | Status: DC | PRN

## 2019-10-26 MED ORDER — DIPHENHYDRAMINE HCL 50 MG/ML IJ SOLN: 50.0000 mg | Freq: Once | INTRAMUSCULAR | Status: DC | PRN

## 2019-10-26 NOTE — Progress Notes (Signed)
  Diagnosis: COVID-19  Physician:Dr. Joya Gaskins  Procedure: [facility] Med: casirivimab\imdevimab infusion - Provided patient with casirivimab\imdevimab fact sheet for patients, parents and caregivers prior to infusion.  Complications: No immediate complications noted.  Discharge: Discharged home   Ludwig Lean 10/26/2019

## 2019-10-26 NOTE — Discharge Instructions (Signed)

## 2019-10-31 MED FILL — SPIRONOLACTONE 25 MG TABS: 25 | 90 days supply | Qty: 90 | Fill #0

## 2019-11-03 ENCOUNTER — Other Ambulatory Visit: Payer: Self-pay | Admitting: Neurology

## 2019-11-03 ENCOUNTER — Other Ambulatory Visit: Payer: Self-pay | Admitting: Physician Assistant

## 2019-11-03 MED ORDER — METHOCARBAMOL 500 MG PO TABS: 500.0000 mg | ORAL_TABLET | Freq: Four times a day (QID) | ORAL | 1 refills | Status: DC

## 2019-11-03 MED FILL — METHOCARBAMOL 500 MG TABS: 500 | 30 days supply | Qty: 120 | Fill #0

## 2019-11-03 NOTE — Telephone Encounter (Signed)
Patient left a vm asking for a refill of Robaxin.  She knows she got her last RX from Dr. Maureen Ralphs after her knee surgery in June, but would like to continue medication. Last seen in July. Please advise.

## 2019-11-04 ENCOUNTER — Other Ambulatory Visit (HOSPITAL_COMMUNITY): Payer: Self-pay

## 2019-12-26 MED FILL — ALLOPURINOL 100 MG TABS: 100 | 90 days supply | Qty: 90 | Fill #3

## 2020-01-24 ENCOUNTER — Other Ambulatory Visit: Payer: Self-pay | Admitting: Physician Assistant

## 2020-01-24 DIAGNOSIS — I10 Essential (primary) hypertension: Secondary | ICD-10-CM

## 2020-01-24 MED FILL — SPIRONOLACTONE 25 MG TABS: 25 | 90 days supply | Qty: 90 | Fill #1

## 2020-01-26 ENCOUNTER — Other Ambulatory Visit: Payer: Self-pay | Admitting: Physician Assistant

## 2020-01-26 MED FILL — LOSARTAN POTASSIUM 50 MG TA: 50 | 90 days supply | Qty: 180 | Fill #0

## 2020-02-03 ENCOUNTER — Encounter: Payer: Self-pay | Admitting: Physician Assistant

## 2020-02-11 ENCOUNTER — Encounter: Payer: Self-pay | Admitting: Physician Assistant

## 2020-02-11 ENCOUNTER — Ambulatory Visit (INDEPENDENT_AMBULATORY_CARE_PROVIDER_SITE_OTHER): Payer: No Typology Code available for payment source | Admitting: Physician Assistant

## 2020-02-11 ENCOUNTER — Other Ambulatory Visit: Payer: Self-pay

## 2020-02-11 VITALS — BP 136/69 | HR 61 | Ht 68.0 in | Wt 212.0 lb

## 2020-02-11 DIAGNOSIS — Z13220 Encounter for screening for lipoid disorders: Secondary | ICD-10-CM | POA: Diagnosis not present

## 2020-02-11 DIAGNOSIS — R413 Other amnesia: Secondary | ICD-10-CM | POA: Diagnosis not present

## 2020-02-11 NOTE — Progress Notes (Signed)
Subjective:    Patient ID: Amanda [name], female    DOB: 08/05/1955, 65 y.o.   MRN: [id number]  HPI  Patient is a 65 year old female with hypertension who presents to the clinic with some memory concerns.  Over the last 6 months patient has noticed more issues with her short-term memory.  She feels like her husband and children will tell her things and she does not remember having the conversation.  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  Thankfully there were some safety stops that prevented this.  This is not like her.  She denies any headaches, dizziness, mood changes.  Her father did have Parkinson's which does concern her.  She denies any tremor.  Is important to note she did have COVID about 6 months ago.  Looks like these memory changes started happening shortly after that.  .. Active Ambulatory Problems    Diagnosis Date Noted   HYPERCHOLESTEROLEMIA 09/05/2006   Factor V deficiency (HCC) 09/05/2006   Essential hypertension 05/16/2007   DIVERTICULOSIS, COLON 10/18/2006   Constipation 06/21/2009   FATTY LIVER DISEASE 09/12/2007   ARTHRITIS 05/16/2007   LEG CRAMPS 07/17/2009   Obesity 05/23/2010   Status post laparoscopic sleeve gastrectomy 03/04/2018   Bilateral primary osteoarthritis of knee 03/05/2018   Gout 03/05/2018   Chronic pain of both knees 03/05/2018   Hammer toe of left foot 07/23/2018   Epigastric pain 07/23/2018   Onychomycosis 07/23/2018   OA (osteoarthritis) of knee 07/21/2019   Resolved Ambulatory Problems    Diagnosis Date Noted   Acute cystitis 07/17/2009   SLEEP APNEA 10/25/2007   Past Medical History:  Diagnosis Date   Allergy    Arthritis    Blood dyscrasia    CPAP (continuous positive airway pressure) dependence    Diabetes mellitus without complication (HCC)    Fatty liver 4-08   Hip pain    Hypertension     Knee pain    Pneumonia    Recurrent UTI    Sleep apnea        Review of Systems See HPI.     Objective:   Physical Exam Vitals reviewed.  Constitutional:      Appearance: Normal appearance. She is obese.  Neck:     Vascular: No carotid bruit.  Cardiovascular:     Rate and Rhythm: Normal rate and regular rhythm.     Pulses: Normal pulses.  Pulmonary:     Effort: Pulmonary effort is normal.     Breath sounds: Normal breath sounds.  Musculoskeletal:     Right lower leg: No edema.     Left lower leg: No edema.  Neurological:     General: No focal deficit present.     Mental Status: She is alert and oriented to person, place, and time.     Motor: No weakness.     Coordination: Coordination normal.     Gait: Gait normal.  Psychiatric:        Mood and Affect: Mood normal.           Assessment & Plan:  Marland KitchenMarland KitchenDiagnoses and all orders for this visit:  Memory changes -     B12 and Folate Panel -     VITAMIN D 25 Hydroxy (Vit-D Deficiency, Fractures) -     COMPLETE METABOLIC PANEL WITH GFR -     TSH -  CBC  Screening for lipid disorders -     Lipid Panel w/reflex Direct LDL   .Marland Kitchen MMSE - Mini Mental State Exam 02/11/2020  Orientation to time 5  Orientation to Place 4  Registration 3  Attention/ Calculation 5  Recall 3  Language- name 2 objects 2  Language- repeat 1  Language- follow 3 step command 3  Language- read & follow direction 1  Write a sentence 1  Copy design 1  Total score 29   Reassured her that MMSE was great.  Will get some labs to look at metabolic reasons for memory changes.  Mood/stress can also cause some mild cognitive impairment.  She did have covid about the time the memory issues started. Discussed this could also be some covid sequela. I don't see a lot of concern today but it is worth monitoring.  Discussed ways to improve memory and HO given.  No red flags of tremor, dizziness, headache.  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  Follow up in 3 months for repeat MMSE and conversation.    Spent 30 minutes with patient discussing memory and all the different causes and progression. Decided on a treatment plan.

## 2020-02-11 NOTE — Patient Instructions (Signed)
DHA with fish oil  Memory Compensation Strategies  1. Use "WARM" strategy.  W= write it down  A= associate it  R= repeat it  M= make a mental note  2.   You can keep a Glass blower/designer.  Use a 3-ring notebook with sections for the following: calendar, important names and phone numbers,  medications, doctors' names/phone numbers, lists/reminders, and a section to journal what you did  each day.   3.    Use a calendar to write appointments down.  4.    Write yourself a schedule for the day.  This can be placed on the calendar or in a separate section of the Memory Notebook.  Keeping a  regular schedule can help memory.  5.    Use medication organizer with sections for each day or morning/evening pills.  You may need help loading it  6.    Keep a basket, or pegboard by the door.  Place items that you need to take out with you in the basket or on the pegboard.  You may also want to  include a message board for reminders.  7.    Use sticky notes.  Place sticky notes with reminders in a place where the task is performed.  For example: " turn off the  stove" placed by the stove, "lock the door" placed on the door at eye level, " take your medications" on  the bathroom mirror or by the place where you normally take your medications.  8.    Use alarms/timers.  Use while cooking to remind yourself to check on food or as a reminder to take your medicine, or as a  reminder to make a call, or as a reminder to perform another task, etc.

## 2020-02-12 LAB — LIPID PANEL W/REFLEX DIRECT LDL
Cholesterol: 147 mg/dL (ref <200)
HDL: 42 mg/dL — ABNORMAL LOW (ref >=50)
LDL Cholesterol (Calc): 75 mg/dL (calc)
Non-HDL Cholesterol (Calc): 105 mg/dL (calc) (ref <130)
Total CHOL/HDL Ratio: 3.5 (calc) (ref <5.0)
Triglycerides: 207 mg/dL — ABNORMAL HIGH (ref <150)

## 2020-02-13 ENCOUNTER — Encounter: Payer: Self-pay | Admitting: Physician Assistant

## 2020-02-13 DIAGNOSIS — R7309 Other abnormal glucose: Secondary | ICD-10-CM

## 2020-02-13 LAB — COMPLETE METABOLIC PANEL WITH GFR
AG Ratio: 1.5 (calc) (ref 1.0–2.5)
ALT: 13 U/L (ref 6–29)
AST: 17 U/L (ref 10–35)
Albumin: 4.1 g/dL (ref 3.6–5.1)
Alkaline phosphatase (APISO): 92 U/L (ref 37–153)
BUN: 15 mg/dL (ref 7–25)
CO2: 29 mmol/L (ref 20–32)
Calcium: 9.9 mg/dL (ref 8.6–10.4)
Chloride: 108 mmol/L (ref 98–110)
Creat: 0.98 mg/dL (ref 0.50–0.99)
GFR, Est African American: 71 mL/min/{1.73_m2} (ref >=60)
GFR, Est Non African American: 61 mL/min/{1.73_m2} (ref >=60)
Globulin: 2.7 g/dL (calc) (ref 1.9–3.7)
Glucose, Bld: 124 mg/dL — ABNORMAL HIGH (ref 65–99)
Potassium: 4.8 mmol/L (ref 3.5–5.3)
Sodium: 143 mmol/L (ref 135–146)
Total Bilirubin: 0.5 mg/dL (ref 0.2–1.2)
Total Protein: 6.8 g/dL (ref 6.1–8.1)

## 2020-02-13 LAB — CBC
HCT: 37.9 % (ref 35.0–45.0)
Hemoglobin: 12.8 g/dL (ref 11.7–15.5)
MCH: 32.1 pg (ref 27.0–33.0)
MCHC: 33.8 g/dL (ref 32.0–36.0)
MCV: 95 fL (ref 80.0–100.0)
MPV: 10.9 fL (ref 7.5–12.5)
Platelets: 205 10*3/uL (ref 140–400)
RBC: 3.99 10*6/uL (ref 3.80–5.10)
RDW: 12.3 % (ref 11.0–15.0)
WBC: 8 10*3/uL (ref 3.8–10.8)

## 2020-02-13 LAB — B12 AND FOLATE PANEL
Folate: >24 ng/mL
Vitamin B-12: 532 pg/mL (ref 200–1100)

## 2020-02-13 LAB — TSH: TSH: 0.96 mIU/L (ref 0.40–4.50)

## 2020-02-13 LAB — VITAMIN D 25 HYDROXY (VIT D DEFICIENCY, FRACTURES): Vit D, 25-Hydroxy: 40 ng/mL (ref 30–100)

## 2020-02-13 NOTE — Progress Notes (Signed)
Jo,   TG elevated. I would suggest the fish oil with DHA and low sugar low carb diet.  B12 perfect. Vitamin D good. Continue on at lest 1-2,000 units a day.  Kidney function much better than 7 months ago.  Thyroid great.  Hemoglobin great.  Your fasting sugar is elevated. We need to get A1C to further evaluate for diabetes.

## 2020-02-13 NOTE — Telephone Encounter (Signed)
fyi

## 2020-02-16 ENCOUNTER — Encounter: Payer: Self-pay | Admitting: Physician Assistant

## 2020-03-19 MED FILL — ALLOPURINOL 100 MG TABS: 100 | 90 days supply | Qty: 90 | Fill #4

## 2020-03-19 MED FILL — METHOCARBAMOL 500 MG TABS: 500 | 30 days supply | Qty: 120 | Fill #1

## 2020-03-22 ENCOUNTER — Ambulatory Visit: Payer: No Typology Code available for payment source

## 2020-04-27 ENCOUNTER — Other Ambulatory Visit (HOSPITAL_BASED_OUTPATIENT_CLINIC_OR_DEPARTMENT_OTHER): Payer: Self-pay

## 2020-05-03 ENCOUNTER — Other Ambulatory Visit: Payer: Self-pay | Admitting: Physician Assistant

## 2020-05-03 DIAGNOSIS — I10 Essential (primary) hypertension: Secondary | ICD-10-CM

## 2020-05-05 MED FILL — spIRONOLACTONE 25 MG TABS: 25 | 90 days supply | Qty: 90 | Fill #0

## 2020-05-09 ENCOUNTER — Other Ambulatory Visit (HOSPITAL_COMMUNITY): Payer: Self-pay

## 2020-05-14 ENCOUNTER — Ambulatory Visit: Payer: No Typology Code available for payment source | Admitting: Physician Assistant

## 2020-05-17 ENCOUNTER — Ambulatory Visit: Payer: No Typology Code available for payment source | Admitting: Physician Assistant

## 2020-06-25 ENCOUNTER — Other Ambulatory Visit (HOSPITAL_COMMUNITY): Payer: Self-pay

## 2020-06-25 ENCOUNTER — Other Ambulatory Visit: Payer: Self-pay | Admitting: Physician Assistant

## 2020-06-25 DIAGNOSIS — M1A4720 Other secondary chronic gout, left ankle and foot, without tophus (tophi): Secondary | ICD-10-CM

## 2020-06-25 MED ORDER — ALLOPURINOL 100 MG PO TABS
ORAL_TABLET | Freq: Every day | ORAL | 2 refills | Status: DC
  Filled 2020-06-25: qty 90, 90d supply, fill #0
  Filled 2020-09-26: qty 90, 90d supply, fill #1
  Filled 2021-01-02: qty 90, 90d supply, fill #2

## 2020-07-01 LAB — HEMOGLOBIN A1C
Hgb A1c MFr Bld: 6 % of total Hgb — ABNORMAL HIGH (ref <5.7)
Mean Plasma Glucose: 126 mg/dL
eAG (mmol/L): 7 mmol/L

## 2020-07-01 NOTE — Telephone Encounter (Signed)
Stable A1C.

## 2020-07-07 ENCOUNTER — Encounter: Payer: Self-pay | Admitting: Physician Assistant

## 2020-07-07 ENCOUNTER — Other Ambulatory Visit: Payer: Self-pay | Admitting: Physician Assistant

## 2020-07-07 ENCOUNTER — Ambulatory Visit (INDEPENDENT_AMBULATORY_CARE_PROVIDER_SITE_OTHER): Payer: 59

## 2020-07-07 ENCOUNTER — Other Ambulatory Visit: Payer: Self-pay

## 2020-07-07 ENCOUNTER — Other Ambulatory Visit (HOSPITAL_COMMUNITY): Payer: Self-pay

## 2020-07-07 ENCOUNTER — Other Ambulatory Visit (HOSPITAL_COMMUNITY)
Admission: RE | Admit: 2020-07-07 | Discharge: 2020-07-07 | Disposition: A | Discharge status: Home / Self Care | Payer: 59 | Source: Ambulatory Visit | Attending: Physician Assistant | Admitting: Physician Assistant

## 2020-07-07 ENCOUNTER — Ambulatory Visit (INDEPENDENT_AMBULATORY_CARE_PROVIDER_SITE_OTHER): Payer: 59 | Admitting: Physician Assistant

## 2020-07-07 VITALS — BP 146/63 | HR 65 | Ht 68.0 in | Wt 214.0 lb

## 2020-07-07 DIAGNOSIS — Z124 Encounter for screening for malignant neoplasm of cervix: Secondary | ICD-10-CM

## 2020-07-07 DIAGNOSIS — M545 Low back pain, unspecified: Secondary | ICD-10-CM | POA: Diagnosis not present

## 2020-07-07 DIAGNOSIS — M25551 Pain in right hip: Secondary | ICD-10-CM

## 2020-07-07 DIAGNOSIS — Z0000 Encounter for general adult medical examination without abnormal findings: Secondary | ICD-10-CM

## 2020-07-07 DIAGNOSIS — R87618 Other abnormal cytological findings on specimens from cervix uteri: Secondary | ICD-10-CM

## 2020-07-07 DIAGNOSIS — I10 Essential (primary) hypertension: Secondary | ICD-10-CM

## 2020-07-07 DIAGNOSIS — Z1231 Encounter for screening mammogram for malignant neoplasm of breast: Secondary | ICD-10-CM

## 2020-07-07 DIAGNOSIS — G8929 Other chronic pain: Secondary | ICD-10-CM

## 2020-07-07 DIAGNOSIS — R413 Other amnesia: Secondary | ICD-10-CM

## 2020-07-07 DIAGNOSIS — R200 Anesthesia of skin: Secondary | ICD-10-CM

## 2020-07-07 DIAGNOSIS — R202 Paresthesia of skin: Secondary | ICD-10-CM

## 2020-07-07 MED ORDER — TRAMADOL HCL 50 MG PO TABS
50.0000 mg | ORAL_TABLET | Freq: Four times a day (QID) | ORAL | 0 refills | Status: DC | PRN
  Filled 2020-07-07 (×2): qty 40, 5d supply, fill #0

## 2020-07-07 NOTE — Patient Instructions (Signed)
Get xrays.  Tramadol as needed.  Will get carotid dopplers ordered.

## 2020-07-07 NOTE — Progress Notes (Signed)
Subjective:    Patient ID: Amanda [name], female    DOB: 12-Jun-1955, 65 y.o.   MRN: [id number]  HPI  Patient is a 65 year old obese female with hypertension, hypercholesterolemia, osteoarthritis with chronic right-sided low back pain and now going into right hip pain who presents to the clinic for annual exam.  She recently had labs done and her A1c was 6.0.  She is aware she needs to work on losing weight.  She does need her Pap smear today.  She has not had any abnormal Paps.  She does not notice any changes in her memory from last visit.  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  Patient is having a lot of right-sided low back pain.  It is not radiating past her right hip.  She has noticed this for the past 6 months and very severe for 2 months.  She did retire February 13.  She is watching her daughters twins and 1 has a health condition where she has to lift him frequently.  This is very hard on her back.  She denies any saddle anesthesia, bladder or bowel dysfunction, leg weakness.  She is using ibuprofen and it does help a lot.  She is wondering if she can have anything else for pain.  Tramadol has helped in the past.  Patient denies any chest pain, palpitations, headache, vision changes.  She has had a few episodes of some right facial numbness.  It was very brief and resolved.  She wanted to know if anything needed to be done.    .. Family History  Problem Relation Age of Onset  . Diabetes Mother   . Hypertension Mother   . Cancer Mother        breast- partial mastectomy- in her 66's  . Heart attack Father   . Hypertension Father   . Arthritis Father   . Gout Father   . Parkinsonism Father   . Parkinson's disease Father   . Alcohol abuse Maternal Grandfather   . Colon cancer Neg Hx   . Esophageal cancer Neg Hx   . Stomach cancer Neg Hx   . Rectal cancer Neg Hx        Review of Systems  All other systems reviewed and are  negative.      Objective:   Physical Exam Vitals reviewed.  Constitutional:      Appearance: Normal appearance. She is obese.  HENT:     Head: Normocephalic.     Right Ear: Tympanic membrane and external ear normal.     Left Ear: Tympanic membrane, ear canal and external ear normal.     Nose: Nose normal.     Mouth/Throat:     Mouth: Mucous membranes are moist.  Eyes:     Extraocular Movements: Extraocular movements intact.     Conjunctiva/sclera: Conjunctivae normal.     Pupils: Pupils are equal, round, and reactive to light.  Neck:     Vascular: No carotid bruit.  Cardiovascular:     Rate and Rhythm: Normal rate and regular rhythm.  Pulmonary:     Effort: Pulmonary effort is normal.     Breath sounds: Normal breath sounds.  Abdominal:     General: Bowel sounds are normal. There is no distension.     Palpations: Abdomen is soft. There is no mass.     Tenderness: There is no abdominal tenderness. There is no right CVA tenderness, left CVA tenderness, guarding or rebound.  Musculoskeletal:     Cervical back: No tenderness.     Right lower leg: No edema.     Left lower leg: No edema.     Comments: Pain with flexion at waist.  No lumbar spinal tenderness.  Right sided paraspinal tenderness to palpation.  Negative bilateral SLR.  5/5 lower ext strength.   Neurological:     General: No focal deficit present.     Mental Status: She is alert and oriented to person, place, and time.  Psychiatric:        Mood and Affect: Mood normal.       .. Depression screen [facility] 2/9 07/07/2020 07/22/2018  Decreased Interest 1 1  Down, Depressed, Hopeless 1 1  PHQ - 2 Score 2 2  Altered sleeping 0 0  Tired, decreased energy 1 1  Change in appetite 2 1  Feeling bad or failure about yourself  1 1  Trouble concentrating 0 0  Moving slowly or fidgety/restless 0 0  Suicidal thoughts 0 0  PHQ-9 Score 6 5  Difficult doing work/chores Somewhat difficult Somewhat difficult   .Marland Kitchen GAD 7 :  Generalized Anxiety Score 07/07/2020 07/22/2018  Nervous, Anxious, on Edge 1 0  Control/stop worrying 1 1  Worry too much - different things 1 1  Trouble relaxing 0 0  Restless 0 0  Easily annoyed or irritable 1 0  Afraid - awful might happen 1 1  Total GAD 7 Score 5 3  Anxiety Difficulty Somewhat difficult Somewhat difficult    .Marland Kitchen MMSE - Mini Mental State Exam 07/07/2020 02/11/2020  Orientation to time 5 5  Orientation to Place 5 4  Registration 3 3  Attention/ Calculation 5 5  Recall 3 3  Language- name 2 objects 2 2  Language- repeat 1 1  Language- follow 3 step command 3 3  Language- read & follow direction 1 1  Write a sentence 1 1  Copy design 1 1  Total score 30 29       Assessment & Plan:  .Marland KitchenSusan was seen today for follow-up.  Diagnoses and all orders for this visit:  Routine physical examination  Encounter for screening mammogram for malignant neoplasm of breast -     MM 3D SCREEN BREAST BILATERAL; Future  Papanicolaou smear -     Cytology - PAP  Essential hypertension  Chronic right-sided low back pain without sciatica -     DG Lumbar Spine Complete; Future -     traMADol (ULTRAM) 50 MG tablet; Take 1-2 tablets (50-100 mg total) by mouth every 6 (six) hours as needed for moderate pain.  Right hip pain -     DG Hip Unilat W OR W/O Pelvis 2-3 Views Right; Future  Memory changes  Numbness and tingling of right side of face -     US Carotid Duplex Bilateral; Future   .Marland Kitchen Discussed 150 minutes of exercise a week.  Encouraged vitamin D 1000 units and Calcium 1300mg  or 4 servings of dairy a day.  PHQ/GAD WNL.  MMSE better than last visit.  Fasting labs ordered.  Pap done today.  Mammogram ordered.  Colonoscopy UTD.  Needs covid booster.  shingrix UTD.    Low back pain going and new right hip pain. Likely DDD. xrays ordered.  Ibuprofen 800mg  up to three times a day. Kidney function to be checked.  Robaxin given.  Discussed PT and stretches.   Discussed good posture.  Discussed tens unit, icy hot patches.  Marland KitchenMarland KitchenPDMP reviewed during  this encounter. Tramadol for break through pain.  Follow up with sports medicine Dr. Darene Lamer.   A few episodes of numbness and tingling. Very short lived. Will get carotid dopplers. Lipid ordered.

## 2020-07-09 ENCOUNTER — Encounter: Payer: Self-pay | Admitting: Physician Assistant

## 2020-07-09 ENCOUNTER — Other Ambulatory Visit (HOSPITAL_COMMUNITY): Payer: Self-pay

## 2020-07-09 DIAGNOSIS — Z13820 Encounter for screening for osteoporosis: Secondary | ICD-10-CM

## 2020-07-09 DIAGNOSIS — M459 Ankylosing spondylitis of unspecified sites in spine: Secondary | ICD-10-CM

## 2020-07-09 MED ORDER — ATORVASTATIN CALCIUM 40 MG PO TABS
40.0000 mg | ORAL_TABLET | Freq: Every day | ORAL | 3 refills | Status: DC
  Filled 2020-07-09: qty 90, 90d supply, fill #0
  Filled 2020-09-26: qty 90, 90d supply, fill #1
  Filled 2021-01-02: qty 90, 90d supply, fill #2
  Filled 2021-04-10: qty 90, 90d supply, fill #3

## 2020-07-09 NOTE — Addendum Note (Signed)
Addended by: Donella Stade on: 07/09/2020 11:37 AM   Modules accepted: Orders

## 2020-07-09 NOTE — Telephone Encounter (Signed)
Bone density scan ordered.   Please send Statin.

## 2020-07-09 NOTE — Progress Notes (Signed)
Denice Paradise,   Bone density changes noted. We do need to get a dexa scan. (ok to order if patient ok this) Lots of arthritis and reason for pain. If PT not helping consider follow up with Dr. Darene Lamer to consider injections etc.  There is evidence of plaque accumulation on distal abdominal aorta. I would consider a statin for your cholesterol and to help with CV prevention. Thoughts?

## 2020-07-09 NOTE — Progress Notes (Signed)
Some bone density changes with no acute or degenerative changes to the hip. The arthritis is in the low back.

## 2020-07-12 ENCOUNTER — Ambulatory Visit (INDEPENDENT_AMBULATORY_CARE_PROVIDER_SITE_OTHER): Payer: 59 | Admitting: Sports Medicine

## 2020-07-12 ENCOUNTER — Ambulatory Visit (INDEPENDENT_AMBULATORY_CARE_PROVIDER_SITE_OTHER): Payer: 59

## 2020-07-12 ENCOUNTER — Other Ambulatory Visit: Payer: Self-pay

## 2020-07-12 DIAGNOSIS — R200 Anesthesia of skin: Secondary | ICD-10-CM | POA: Insufficient documentation

## 2020-07-12 DIAGNOSIS — G8929 Other chronic pain: Secondary | ICD-10-CM

## 2020-07-12 DIAGNOSIS — M47816 Spondylosis without myelopathy or radiculopathy, lumbar region: Secondary | ICD-10-CM | POA: Insufficient documentation

## 2020-07-12 DIAGNOSIS — M545 Low back pain, unspecified: Secondary | ICD-10-CM

## 2020-07-12 DIAGNOSIS — M810 Age-related osteoporosis without current pathological fracture: Secondary | ICD-10-CM | POA: Diagnosis not present

## 2020-07-12 DIAGNOSIS — R413 Other amnesia: Secondary | ICD-10-CM | POA: Insufficient documentation

## 2020-07-12 DIAGNOSIS — R87618 Other abnormal cytological findings on specimens from cervix uteri: Secondary | ICD-10-CM | POA: Insufficient documentation

## 2020-07-12 DIAGNOSIS — M25551 Pain in right hip: Secondary | ICD-10-CM | POA: Insufficient documentation

## 2020-07-12 LAB — CYTOLOGY - PAP
Comment: NEGATIVE
Comment: NEGATIVE
Diagnosis: NEGATIVE
HPV 16: NEGATIVE
HPV 18 / 45: NEGATIVE
High risk HPV: POSITIVE — AB

## 2020-07-12 NOTE — Progress Notes (Signed)
Amanda [name],   Your pap did come back with HPV but not high risk and no abnormal cells. We will need to repeat pap in 1 year.

## 2020-07-12 NOTE — Progress Notes (Signed)
    Procedures performed today:    Procedure: Real-time Ultrasound Guided injection of the right sacroiliac joint Device: Samsung HS60  Verbal informed consent obtained.  Time-out conducted.  Noted no overlying erythema, induration, or other signs of local infection.  Skin prepped in a sterile fashion.  Local anesthesia: Topical Ethyl chloride.  With sterile technique and under real time ultrasound guidance:  Noted arthritic SI joint, 1 cc Kenalog 40, 2 cc lidocaine, 2 cc bupivacaine injected easily Completed without difficulty  Advised to call if fevers/chills, erythema, induration, drainage, or persistent bleeding.  Images permanently stored and available for review in PACS.  Impression: Technically successful ultrasound guided injection.  Independent interpretation of notes and tests performed by another provider:   None.  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  Osteoporosis Denice Paradise has also had a diagnosis of osteoporosis from 4 years ago, T score -2.7 at the proximal femur, getting an updated bone density test and if it continues to show osteoporosis we will probably need to discuss therapy which may mean Prolia or Fosamax.    ___________________________________________ Gwen Her. Dianah Field, M.D., ABFM., CAQSM. Primary Care and [place] Instructor of [place] [employer] [facility] [employer]

## 2020-07-12 NOTE — Assessment & Plan Note (Signed)
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

## 2020-07-12 NOTE — Assessment & Plan Note (Signed)
Amanda [name] has also had a diagnosis of osteoporosis from 4 years ago, T score -2.7 at the proximal femur, getting an updated bone density test and if it continues to show osteoporosis we will probably need to discuss therapy which may mean Prolia or Fosamax.

## 2020-07-18 ENCOUNTER — Encounter (INDEPENDENT_AMBULATORY_CARE_PROVIDER_SITE_OTHER): Payer: 59

## 2020-07-18 DIAGNOSIS — G8929 Other chronic pain: Secondary | ICD-10-CM | POA: Diagnosis not present

## 2020-07-18 DIAGNOSIS — M545 Low back pain, unspecified: Secondary | ICD-10-CM | POA: Diagnosis not present

## 2020-07-18 DIAGNOSIS — M810 Age-related osteoporosis without current pathological fracture: Secondary | ICD-10-CM

## 2020-07-19 ENCOUNTER — Other Ambulatory Visit (HOSPITAL_COMMUNITY): Payer: Self-pay

## 2020-07-19 MED ORDER — PREDNISONE 50 MG PO TABS: ORAL_TABLET | ORAL | 0 refills | Status: DC

## 2020-07-19 MED ORDER — PREDNISONE 50 MG PO TABS
ORAL_TABLET | ORAL | 0 refills | Status: DC
  Filled 2020-07-19: qty 5, 5d supply, fill #0

## 2020-07-19 MED FILL — Losartan Potassium Tab 50 MG: ORAL | 90 days supply | Qty: 180 | Fill #0 | Status: AC

## 2020-07-19 NOTE — Telephone Encounter (Signed)
I spent 5 total minutes of online digital evaluation and management services. 

## 2020-07-19 NOTE — Addendum Note (Signed)
Addended by: Narda Rutherford on: 07/19/2020 10:32 AM   Modules accepted: Orders

## 2020-07-19 NOTE — Assessment & Plan Note (Signed)
Only minimal relief with SI joint injection, she is better with flexion, suspect lumbar spinal stenosis, adding prednisone, formal PT. In 6 weeks we will do an MRI if no better.

## 2020-07-20 ENCOUNTER — Encounter: Payer: Self-pay | Admitting: Rehabilitative and Restorative Service Providers"

## 2020-07-20 ENCOUNTER — Ambulatory Visit (INDEPENDENT_AMBULATORY_CARE_PROVIDER_SITE_OTHER): Payer: 59 | Admitting: Rehabilitative and Restorative Service Providers"

## 2020-07-20 DIAGNOSIS — M5441 Lumbago with sciatica, right side: Secondary | ICD-10-CM | POA: Diagnosis not present

## 2020-07-20 DIAGNOSIS — R531 Weakness: Secondary | ICD-10-CM

## 2020-07-20 DIAGNOSIS — R293 Abnormal posture: Secondary | ICD-10-CM

## 2020-07-20 DIAGNOSIS — R29898 Other symptoms and signs involving the musculoskeletal system: Secondary | ICD-10-CM

## 2020-07-20 NOTE — Therapy (Signed)
[place], [place], [address] Phone: [phone]   Fax:  [phone]  Physical Therapy Treatment  Patient Details  Name: Amanda [name] MRN: [id number] Date of Birth: 05/18/1955 Referring Provider (PT): Dr Dianah Field   Encounter Date: 07/20/2020   PT End of Session - 07/20/20 1058     Visit Number 1    Number of Visits 12    Date for PT Re-Evaluation 08/31/20    PT Start Time 0804    PT Stop Time 0849    PT Time Calculation (min) 45 min    Activity Tolerance Patient tolerated treatment well             Past Medical History:  Diagnosis Date   Allergy    seasonal   Arthritis    all over   Blood dyscrasia    Carrys trait for Leiden Factor five never had any issues . Yhe only reason she was tested was because her mother had it   CPAP (continuous positive airway pressure) dependence    8 cm water w/ heated humidifier   Diabetes mellitus without complication ([place])    Pre surgery - Gastric sx 2014 Roun-Y no diabetes since  no meds   Fatty liver 4-08   by [place]   Gout    Hip pain    Hypertension    Knee pain    Obesity    Pneumonia    Recurrent UTI    Sleep apnea    had roux en y lost 140lbs no longer needs cpap    Past Surgical History:  Procedure Laterality Date   [place]   one   [place]   reduction mammoplasty  [place] GASTRIC BYPASS  2014   TOTAL KNEE ARTHROPLASTY Right 07/21/2019   Procedure: TOTAL KNEE ARTHROPLASTY;  Surgeon: Gaynelle Arabian, MD;  Location: [facility];  Service: Orthopedics;  Laterality: Right;  16min    There were no vitals filed for this visit.   Subjective Assessment - 07/20/20 0811     Subjective Patient reports that she has has LBP for the past 4-5 years but this has been significantly worse. She has had increased pain in the past year  especially in the past Rt posterior lateral hip into the thigh in the past 2 weeks.  She feels increased pain is related to lifting her grandchildren, especially grandson with spina bifida - 8 months old who weighs over 40 pounds.    Pertinent History arthritis, hx of LBP with Rt LE radiculopathy; Rt TKA 07/21/19; c-section 1989; 2x [place]; colon sx 1976; breast reduction 1985; gastric bypass 2014    Patient Stated Goals get rid of the pain    Currently in Pain? Yes    Pain Score 9     Pain Location Back    Pain Orientation Right;Lower;Lateral    Pain Descriptors / Indicators Constant;Sharp    Pain Type Acute pain;Chronic pain    Pain Radiating Towards Rt posterior hip to lateral thigh to mid thigh    Pain Onset More than a month ago    Pain Frequency Constant    Aggravating Factors  standing; changing positions; lifting; carrying    Pain Relieving Factors heat; meds; changing positions                [facility] PT  Assessment - 07/20/20 0001       Assessment   Medical Diagnosis Rt LBP with Rt LE radicular pain    Referring Provider (PT) Dr Dianah Field    Onset Date/Surgical Date 02/07/20    Hand Dominance Right    Next MD Visit 08/10/20    Prior Therapy here for LBp and Rt TKA      Precautions   Precautions None      Balance Screen   Has the patient fallen in the past 6 months No    Has the patient had a decrease in activity level because of a fear of falling?  No    Is the patient reluctant to leave their home because of a fear of falling?  No      Home Ecologist residence      Prior Function   Level of Independence Independent    Vocation Part time employment    Vocation Requirements retired Publishing copy; still part time ~ 6 or more hours/week    Leisure child care for [employer]      Observation/Other Assessments   Focus on Therapeutic Outcomes (FOTO)  31      Posture/Postural Control   Posture Comments forward flexed posture  with upper body shifted to the Lt; sits with forward posture, with trunk shifted to the Lt      AROM   Lumbar Flexion 80% pain with return to standing    Lumbar Extension 10% pain    Lumbar - Right Side Bend pain    Lumbar - Left Side Bend 80%    Lumbar - Right Rotation 50% pain    Lumbar - Left Rotation 85%      Strength   Overall Strength Comments functional strength - not assessed resistively      Flexibility   Hamstrings tight    Quadriceps tight    ITB tight    Piriformis tight    Quadratus Lumborum tight      Palpation   Spinal mobility hypomobiliity lumbar spine with CPA mobs     Palpation comment muscular tightness Rt LB to posterior hip                           [facility] Adult PT Treatment/Exercise - 07/20/20 0001       Self-Care   Self-Care Other Self-Care Comments    Other Self-Care Comments  initiated back care education      Lumbar Exercises: Stretches   Standing Extension 2 reps;5 seconds    Prone on Elbows Stretch 1 rep;60 seconds      Moist Heat Therapy   Number Minutes Moist Heat 10 Minutes    Moist Heat Location Lumbar Spine      Electrical Stimulation   Electrical Stimulation Location Rt lumbar to posterior hip    Electrical Stimulation Action IFC    Electrical Stimulation Parameters to tolerance    Electrical Stimulation Goals Pain;Tone                    PT Education - 07/20/20 0847     Education Details HEP POC posture; community resources    Person(s) Educated Patient    Methods Explanation;Demonstration;Tactile cues;Verbal cues;Handout    Comprehension Verbalized understanding;Returned demonstration;Verbal cues required;Tactile cues required                 PT Long Term Goals - 07/20/20 1226  PT LONG TERM GOAL #1   Title Patient to demonstrate upright posture with sitting; standing and walking    Time 6    Period Weeks    Status New    Target Date 08/31/20      PT LONG TERM GOAL #2   Title  Patient to tolerate sitting; standing; walking for 15-20 minutes with minimal to no increase in pain    Time 6    Period Weeks    Status New    Target Date 08/31/20      PT LONG TERM GOAL #3   Title Patient to demonstrate and/or verbalize proper back care and body mechanics for ADL's including lifting    Time 6    Period Weeks    Status New    Target Date 08/31/20      PT LONG TERM GOAL #4   Title Independent in HEP    Time 6    Period Weeks    Status New    Target Date 08/31/20      PT LONG TERM GOAL #5   Title Improve functional limitation level to 47    Time 6    Period Weeks    Status New    Target Date 08/31/20                   Plan - 07/20/20 1059     Clinical Impression Statement Patient presents with ~ 6 month history of LBP with Rt LE radicular pain. She has a history of LBP with LE radicular pain. Denice Paradise has limited trunk and LE mobility and ROM; pain with palpation through the lumbar and posterior Rt hip; poor posture and alignment; pain with functional activities; limited ADL's, lifting, sitting, transitional movements, sleep, etc Patient will benefit from PT to address problems identified.    Stability/Clinical Decision Making Stable/Uncomplicated    Clinical Decision Making Low    Rehab Potential Good    PT Frequency 2x / week    PT Duration 6 weeks    PT Treatment/Interventions ADLs/Self Care Home Management;Aquatic Therapy;Cryotherapy;Electrical Stimulation;Iontophoresis 4mg /ml Dexamethasone;Moist Heat;Ultrasound;Traction;Functional mobility training;Therapeutic activities;Therapeutic exercise;Neuromuscular re-education;Patient/family education;Manual techniques;Taping;Dry needling    PT Next Visit Plan review and progress HEP; modalities as indicated    PT Home Exercise Plan [email or web address]    Consulted and Agree with Plan of Care Patient             Patient will benefit from skilled therapeutic intervention in order to improve the following deficits  and impairments:  Decreased range of motion, Increased fascial restricitons, Pain, Hypomobility, Impaired flexibility, Improper body mechanics, Other (comment), Decreased mobility, Postural dysfunction  Visit Diagnosis: Acute right-sided low back pain with right-sided sciatica - Plan: PT plan of care cert/re-cert  Abnormal posture - Plan: PT plan of care cert/re-cert  Other symptoms and signs involving the musculoskeletal system - Plan: PT plan of care cert/re-cert  Weakness generalized - Plan: PT plan of care cert/re-cert     Problem List Patient Active Problem List   Diagnosis Date Noted   Memory changes 07/12/2020   Right hip pain 07/12/2020   Chronic right-sided low back pain without sciatica 07/12/2020   Numbness and tingling of right side of face 07/12/2020   Osteoporosis 07/12/2020   Pap smear abnormality of cervix/human papillomavirus (HPV) positive 07/12/2020   OA (osteoarthritis) of knee 07/21/2019   Hammer toe of left foot 07/23/2018   Epigastric pain 07/23/2018   Onychomycosis 07/23/2018   Bilateral primary osteoarthritis of knee  03/05/2018   Gout 03/05/2018   Chronic pain of both knees 03/05/2018   Status post laparoscopic sleeve gastrectomy 03/04/2018   Obesity 05/23/2010   LEG CRAMPS 07/17/2009   Constipation 06/21/2009   FATTY LIVER DISEASE 09/12/2007   Essential hypertension 05/16/2007   ARTHRITIS 05/16/2007   DIVERTICULOSIS, COLON 10/18/2006   HYPERCHOLESTEROLEMIA 09/05/2006   Factor V deficiency ([place]) 09/05/2006    Katarina Riebe Nilda Simmer PT, MPH  07/20/2020, 12:45 PM   [facility] [place], [place], [address] Phone: [phone]   Fax:  [phone]  Name: JAYANI ROZMAN MRN: [id number] Date of Birth: [birth year]

## 2020-07-20 NOTE — Patient Instructions (Signed)
Access Code: [email or web address] URL: [email or web address] Date: 07/20/2020 Prepared by: Gillermo Murdoch  Exercises Prone on Elbows Stretch - 2 x daily - 7 x weekly - 1 sets - 3 reps - 30 sec hold Prone Press Up - 2 x daily - 7 x weekly - 1 sets - 10 reps - 2-3 sec hold Standing Lumbar Extension - 2 x daily - 7 x weekly - 1 sets - 2-3 reps - 2-3 sec hold Supine Transversus Abdominis Bracing with Pelvic Floor Contraction - 2 x daily - 7 x weekly - 1 sets - 10 reps - 10sec hold Sit to Stand - 2 x daily - 7 x weekly - 1 sets - 10 reps - 3-5 sec hold Wall Quarter Squat - 2 x daily - 7 x weekly - 1-2 sets - 10 reps - 5-10 sec hold  Patient Education Posture and [place] Programs Location and contact info description of services cost  [employer] [facility] [place], [place] [phone]  Aquatics, silver sneakers classes, yoga, exercise equipment $18/month for 55+ $25/month individual $40-$60/month family  [facility] [address] [place], [place] [phone] Transportation to appointments. Activities (stretching, dancing, Tai Chi)  Mostly free  [facility] [address] [place], [place] [phone]  [facility] [employer] Fee based  healthwise exercise program Various locations [phone]  [place] exercise free  [place] Various locations [phone] [place], [place], [place], Table tennis, chair volleyball, petanque  free  [facility] [place]. [place], [place]  [phone] Various activities (drum exercise, yoga, line dancing, etc)  free

## 2020-07-21 ENCOUNTER — Ambulatory Visit: Payer: 59

## 2020-07-22 ENCOUNTER — Encounter: Payer: 59 | Admitting: Rehabilitative and Restorative Service Providers"

## 2020-07-23 ENCOUNTER — Ambulatory Visit (INDEPENDENT_AMBULATORY_CARE_PROVIDER_SITE_OTHER): Payer: 59 | Admitting: Rehabilitative and Restorative Service Providers"

## 2020-07-23 ENCOUNTER — Encounter: Payer: Self-pay | Admitting: Rehabilitative and Restorative Service Providers"

## 2020-07-23 ENCOUNTER — Other Ambulatory Visit: Payer: Self-pay

## 2020-07-23 DIAGNOSIS — M5441 Lumbago with sciatica, right side: Secondary | ICD-10-CM

## 2020-07-23 DIAGNOSIS — R293 Abnormal posture: Secondary | ICD-10-CM | POA: Diagnosis not present

## 2020-07-23 DIAGNOSIS — R531 Weakness: Secondary | ICD-10-CM | POA: Diagnosis not present

## 2020-07-23 DIAGNOSIS — R29898 Other symptoms and signs involving the musculoskeletal system: Secondary | ICD-10-CM | POA: Diagnosis not present

## 2020-07-23 NOTE — Patient Instructions (Signed)
Access Code: [email or web address]: [email or web address]: 06/17/2022Prepared by: Deasiah Hagberg HoltExercises  Prone on Elbows Stretch - 2 x daily - 7 x weekly - 1 sets - 3 reps - 30 sec hold  Prone Press Up - 2 x daily - 7 x weekly - 1 sets - 10 reps - 2-3 sec hold  Standing Lumbar Extension - 2 x daily - 7 x weekly - 1 sets - 2-3 reps - 2-3 sec hold  Supine Transversus Abdominis Bracing with Pelvic Floor Contraction - 2 x daily - 7 x weekly - 1 sets - 10 reps - 10sec hold  Sit to Stand - 2 x daily - 7 x weekly - 1 sets - 10 reps - 3-5 sec hold  Wall Quarter Squat - 2 x daily - 7 x weekly - 1-2 sets - 10 reps - 5-10 sec hold  Prone Gluteal Sets - 2 x daily - 7 x weekly - 1 sets - 10 reps - 10 sec hold  Supine Piriformis Stretch with Leg Straight - 2 x daily - 7 x weekly - 1 sets - 3 reps - 30 sec hold

## 2020-07-23 NOTE — Therapy (Addendum)
[place], [place], [address] Phone: [phone]   Fax:  [phone]  Physical Therapy Treatment  Patient Details  Name: Amanda [name] MRN: [id number] Date of Birth: July 21, 1955 Referring Provider (PT): Dr Dianah Field   Encounter Date: 07/23/2020   PT End of Session - 07/23/20 1100     Visit Number 2    Number of Visits 12    Date for PT Re-Evaluation 08/31/20    PT Start Time 1059    PT Stop Time 1148    PT Time Calculation (min) 49 min             Past Medical History:  Diagnosis Date   Allergy    seasonal   Arthritis    all over   Blood dyscrasia    Carrys trait for Leiden Factor five never had any issues . Yhe only reason she was tested was because her mother had it   CPAP (continuous positive airway pressure) dependence    8 cm water w/ heated humidifier   Diabetes mellitus without complication ([place])    Pre surgery - Gastric sx 2014 Roun-Y no diabetes since  no meds   Fatty liver 4-08   by [place]   Gout    Hip pain    Hypertension    Knee pain    Obesity    Pneumonia    Recurrent UTI    Sleep apnea    had roux en y lost 140lbs no longer needs cpap    Past Surgical History:  Procedure Laterality Date   [place]   one   [place]   reduction mammoplasty  [place] GASTRIC BYPASS  2014   TOTAL KNEE ARTHROPLASTY Right 07/21/2019   Procedure: TOTAL KNEE ARTHROPLASTY;  Surgeon: Gaynelle Arabian, MD;  Location: [facility];  Service: Orthopedics;  Laterality: Right;  34min    There were no vitals filed for this visit.                      [place] Adult PT Treatment/Exercise - 07/23/20 0001       Lumbar Exercises: Stretches   Standing Extension 2 reps;5 seconds    Prone on Elbows Stretch 1 rep;60 seconds    Press Ups 10 reps   2 sec hold - to patient's  tolerable range without pain   Piriformis Stretch Right;3 reps;30 seconds   supine travell     Lumbar Exercises: Standing   Wall Slides 5 reps;5 seconds      Lumbar Exercises: Seated   Sit to Stand Limitations reviewed - patient using UE's for support      Lumbar Exercises: Supine   AB Set Limitations 3 part core tightening pelvic floor, transverse abdominals; multifidi maintaining neutral spine 10 sec hold x 10 reps      Lumbar Exercises: Prone   Other Prone Lumbar Exercises glut set 5 sec hold x 10      Moist Heat Therapy   Number Minutes Moist Heat 10 Minutes    Moist Heat Location Lumbar Spine      Electrical Stimulation   Electrical Stimulation Location Rt posterior hip    Electrical Stimulation Action IFC    Electrical Stimulation Parameters to tolerance    Electrical Stimulation Goals Pain;Tone      Manual Therapy   Manual  therapy comments pt prone    Joint Mobilization PA mobs Rt greater trochanter    Soft tissue mobilization deep tissue work through the Rt lumbar musculature with focue on the Rt posterior hip in piriformis and gluts    Myofascial Release Rt posterior hip    Passive ROM IR/ER Rt hip pt prone hip neutral knee flexed                    PT Education - 07/23/20 1150     Education Details HEP    Person(s) Educated Patient    Methods Explanation;Demonstration;Tactile cues;Verbal cues;Handout    Comprehension Verbalized understanding;Returned demonstration;Verbal cues required                 PT Long Term Goals - 07/20/20 1226       PT LONG TERM GOAL #1   Title Patient to demonstrate upright posture with sitting; standing and walking    Time 6    Period Weeks    Status New    Target Date 08/31/20      PT LONG TERM GOAL #2   Title Patient to tolerate sitting; standing; walking for 15-20 minutes with minimal to no increase in pain    Time 6    Period Weeks    Status New    Target Date 08/31/20      PT LONG TERM GOAL #3    Title Patient to demonstrate and/or verbalize proper back care and body mechanics for ADL's including lifting    Time 6    Period Weeks    Status New    Target Date 08/31/20      PT LONG TERM GOAL #4   Title Independent in HEP    Time 6    Period Weeks    Status New    Target Date 08/31/20      PT LONG TERM GOAL #5   Title Improve functional limitation level to 47    Time 6    Period Weeks    Status New    Target Date 08/31/20                   Plan - 07/23/20 1121     Clinical Impression Statement Amanda [name] reports some improvement since initial visit. She went to water aerobics class yesterday. Discussed water exercise precautions and option to schedule aquatic therapy through clinic. Worked o deep tissue work through [employer] and gluts with significant palpable tightness through the piriformis. Good release with manual work and stretching. Continue with core stabilization.    Rehab Potential Good    PT Frequency 2x / week    PT Duration 6 weeks    PT Treatment/Interventions ADLs/Self Care Home Management;Aquatic Therapy;Cryotherapy;Electrical Stimulation;Iontophoresis 4mg /ml Dexamethasone;Moist Heat;Ultrasound;Traction;Functional mobility training;Therapeutic activities;Therapeutic exercise;Neuromuscular re-education;Patient/family education;Manual techniques;Taping;Dry needling    PT Next Visit Plan review and progress HEP progressing with ther ex and core stabilization as tolerate; modalities as indicated    PT Home Exercise Plan [email or web address]    Consulted and Agree with Plan of Care Patient             Patient will benefit from skilled therapeutic intervention in order to improve the following deficits and impairments:     Visit Diagnosis: Acute right-sided low back pain with right-sided sciatica  Abnormal posture  Other symptoms and signs involving the musculoskeletal system  Weakness generalized     Problem List Patient Active Problem List   Diagnosis  Date Noted  Memory changes 07/12/2020   Right hip pain 07/12/2020   Chronic right-sided low back pain without sciatica 07/12/2020   Numbness and tingling of right side of face 07/12/2020   Osteoporosis 07/12/2020   Pap smear abnormality of cervix/human papillomavirus (HPV) positive 07/12/2020   OA (osteoarthritis) of knee 07/21/2019   Hammer toe of left foot 07/23/2018   Epigastric pain 07/23/2018   Onychomycosis 07/23/2018   Bilateral primary osteoarthritis of knee 03/05/2018   Gout 03/05/2018   Chronic pain of both knees 03/05/2018   Status post laparoscopic sleeve gastrectomy 03/04/2018   Obesity 05/23/2010   LEG CRAMPS 07/17/2009   Constipation 06/21/2009   FATTY LIVER DISEASE 09/12/2007   Essential hypertension 05/16/2007   ARTHRITIS 05/16/2007   DIVERTICULOSIS, COLON 10/18/2006   HYPERCHOLESTEROLEMIA 09/05/2006   Factor V deficiency ([place]) 09/05/2006    Amanda [name] Nilda Simmer PT, MPH  07/23/2020, 11:52 AM  [facility] [place] [address] [place], [place], [address] Phone: [phone]   Fax:  [phone]  Name: Amanda [name] MRN: [id number] Date of Birth: 28-Oct-1955  PHYSICAL THERAPY DISCHARGE SUMMARY  Visits from Start of Care: 2  Current functional level related to goals / functional outcomes: See progress note for discharge status    Remaining deficits: Continued pain and functional limitations    Education / Equipment: HEP  Patient agrees to discharge. Patient goals were not met. Patient is being discharged due to not returning since the last visit.  Amanda Lamere P. Amanda [name] PT, MPH 09/15/20 8:06 AM

## 2020-07-26 ENCOUNTER — Other Ambulatory Visit (HOSPITAL_COMMUNITY): Payer: Self-pay

## 2020-07-26 MED FILL — Spironolactone Tab 25 MG: ORAL | 90 days supply | Qty: 90 | Fill #0 | Status: AC

## 2020-07-28 ENCOUNTER — Ambulatory Visit: Payer: 59

## 2020-07-28 ENCOUNTER — Other Ambulatory Visit (HOSPITAL_COMMUNITY): Payer: Self-pay

## 2020-07-28 ENCOUNTER — Telehealth: Payer: Self-pay

## 2020-07-28 ENCOUNTER — Ambulatory Visit (INDEPENDENT_AMBULATORY_CARE_PROVIDER_SITE_OTHER): Payer: 59

## 2020-07-28 ENCOUNTER — Other Ambulatory Visit: Payer: Self-pay | Admitting: Physician Assistant

## 2020-07-28 ENCOUNTER — Other Ambulatory Visit: Payer: Self-pay

## 2020-07-28 DIAGNOSIS — M810 Age-related osteoporosis without current pathological fracture: Secondary | ICD-10-CM | POA: Diagnosis not present

## 2020-07-28 DIAGNOSIS — Z1231 Encounter for screening mammogram for malignant neoplasm of breast: Secondary | ICD-10-CM

## 2020-07-28 DIAGNOSIS — G8929 Other chronic pain: Secondary | ICD-10-CM

## 2020-07-28 MED ORDER — ALENDRONATE SODIUM 70 MG PO TABS
70.0000 mg | ORAL_TABLET | ORAL | 11 refills | Status: DC
  Filled 2020-07-28: qty 4, 28d supply, fill #0
  Filled 2020-08-21: qty 4, 28d supply, fill #1
  Filled 2020-09-26: qty 4, 28d supply, fill #2
  Filled 2020-10-23: qty 4, 28d supply, fill #3
  Filled 2020-12-06: qty 4, 28d supply, fill #4
  Filled 2021-01-01: qty 4, 28d supply, fill #5
  Filled 2021-01-22: qty 4, 28d supply, fill #6
  Filled 2021-03-04: qty 4, 28d supply, fill #7
  Filled 2021-04-02: qty 4, 28d supply, fill #8
  Filled 2021-04-24: qty 4, 28d supply, fill #9
  Filled 2021-05-21: qty 4, 28d supply, fill #10

## 2020-07-28 NOTE — Telephone Encounter (Signed)
No, hitting a nerve would cause numbness and tingling down the back of the leg.  Tough to tell with causing it without looking at her again.  If vital have any openings she should see one of my partners.  Most likely needs an updated hip x-ray.  Did she have a fall?

## 2020-07-28 NOTE — Addendum Note (Signed)
Addended by: Silverio Decamp on: 07/28/2020 05:05 PM   Modules accepted: Orders

## 2020-07-28 NOTE — Telephone Encounter (Signed)
Patient reports having significant pain in the front of her right hip after you did her SI injection. She is questioning if you hit a nerve. She states she is in extruciating pain and had to use a wheelchair to come in the building to do her bone density.

## 2020-07-29 NOTE — Progress Notes (Signed)
Normal mammogram. Follow up in 1 year.

## 2020-07-30 ENCOUNTER — Other Ambulatory Visit (HOSPITAL_COMMUNITY): Payer: Self-pay

## 2020-07-30 MED ORDER — TRAMADOL HCL 50 MG PO TABS
50.0000 mg | ORAL_TABLET | Freq: Four times a day (QID) | ORAL | 0 refills | Status: DC | PRN
  Filled 2020-07-30: qty 40, 5d supply, fill #0

## 2020-07-30 NOTE — Telephone Encounter (Signed)
Spoke with patient regarding Dr. Darene Lamer recommendations. She has an appt with Jade on Monday, 08/02/20. She will address this then. She also has a follow up appt with Dr. Darene Lamer on 08/06/20.

## 2020-08-02 ENCOUNTER — Telehealth (INDEPENDENT_AMBULATORY_CARE_PROVIDER_SITE_OTHER): Payer: 59 | Admitting: Physician Assistant

## 2020-08-02 ENCOUNTER — Encounter: Payer: Self-pay | Admitting: Physician Assistant

## 2020-08-02 ENCOUNTER — Other Ambulatory Visit (HOSPITAL_COMMUNITY): Payer: Self-pay

## 2020-08-02 DIAGNOSIS — M79651 Pain in right thigh: Secondary | ICD-10-CM | POA: Diagnosis not present

## 2020-08-02 DIAGNOSIS — M545 Low back pain, unspecified: Secondary | ICD-10-CM

## 2020-08-02 DIAGNOSIS — G8929 Other chronic pain: Secondary | ICD-10-CM

## 2020-08-02 DIAGNOSIS — R1031 Right lower quadrant pain: Secondary | ICD-10-CM | POA: Diagnosis not present

## 2020-08-02 MED ORDER — VALACYCLOVIR HCL 1 G PO TABS
1000.0000 mg | ORAL_TABLET | Freq: Three times a day (TID) | ORAL | 0 refills | Status: AC
  Filled 2020-08-02: qty 21, 7d supply, fill #0

## 2020-08-02 MED ORDER — GABAPENTIN 100 MG PO CAPS
ORAL_CAPSULE | ORAL | 0 refills | Status: DC
  Filled 2020-08-02: qty 90, 10d supply, fill #0

## 2020-08-02 NOTE — Progress Notes (Signed)
Right thigh pain, started June 8th, after SI injection Pain while sitting, feels like "electric" while walking.

## 2020-08-02 NOTE — Progress Notes (Signed)
..Virtual Visit via Video Note  I connected with Amanda [name] on 08/02/20 at  8:10 AM EDT by a video enabled telemedicine application and verified that I am speaking with the correct person using two identifiers.  Location: Patient: home Provider: clinic  .Marland KitchenParticipating in visit:  Patient:  Amanda [name] Provider: Iran Planas PA-C   I discussed the limitations of evaluation and management by telemedicine and the availability of in person appointments. The patient expressed understanding and agreed to proceed.  History of Present Illness: Pt is a 65 yo obese female who calls into the clinic with increasing severe pain of right anterior thigh and groin. She has ongoing chronic right sided low back pain without sciatica and had SI injection on 07/12/2020. About 2 days later this "new" pain started. It has been going on for last 20 days.  It is electric. It comes in burst of pain and worse with movement. It is better with heating pad and rest. No new injury. Hip xray normal within the month. She is doing ibuprofen 400-800mg  up to three times a day, tramadol, muscle relaxers with little benefit. She is worried about her stomach and the NSAIDs. She started PT for low back pain but insurance is not paying for it and is going to have to start doing them on her own. No fever, chills, body aches. She reached back out to Dr. Darene Lamer but he did not give her any new advice.   Recently rx fosmax but she has not started this medication.  Chronic right sided low back pain without sciatrica 6/6 Ibuprofen/trmadol.   Active Ambulatory Problems    Diagnosis Date Noted   HYPERCHOLESTEROLEMIA 09/05/2006   Factor V deficiency ([place]) 09/05/2006   Essential hypertension 05/16/2007   DIVERTICULOSIS, COLON 10/18/2006   Constipation 06/21/2009   FATTY LIVER DISEASE 09/12/2007   ARTHRITIS 05/16/2007   LEG CRAMPS 07/17/2009   Obesity 05/23/2010   Status post laparoscopic sleeve gastrectomy 03/04/2018   Bilateral primary  osteoarthritis of knee 03/05/2018   Gout 03/05/2018   Chronic pain of both knees 03/05/2018   Hammer toe of left foot 07/23/2018   Epigastric pain 07/23/2018   Onychomycosis 07/23/2018   OA (osteoarthritis) of knee 07/21/2019   Memory changes 07/12/2020   Right hip pain 07/12/2020   Chronic right-sided low back pain without sciatica 07/12/2020   Numbness and tingling of right side of face 07/12/2020   Osteoporosis 07/12/2020   Pap smear abnormality of cervix/human papillomavirus (HPV) positive 07/12/2020   Resolved Ambulatory Problems    Diagnosis Date Noted   Acute cystitis 07/17/2009   SLEEP APNEA 10/25/2007   Past Medical History:  Diagnosis Date   Allergy    Arthritis    Blood dyscrasia    CPAP (continuous positive airway pressure) dependence    Diabetes mellitus without complication (HCC)    Fatty liver 4-08   Hip pain    Hypertension    Knee pain    Pneumonia    Recurrent UTI    Sleep apnea     Observations/Objective: No acute distress Normal mood and appearance No rash NROM of right leg.   Assessment and Plan: .Marland KitchenNardos was seen today for thigh pain.  Diagnoses and all orders for this visit:  Acute pain of right thigh -     gabapentin (NEURONTIN) 100 MG capsule; Take 1-3 tablets up to three times a day for pain. -     valACYclovir (VALTREX) 1000 MG tablet; Take 1 tablet (1,000 mg total) by mouth  3 (three) times daily for 10 days.  Groin pain, right -     gabapentin (NEURONTIN) 100 MG capsule; Take 1-3 tablets up to three times a day for pain. -     valACYclovir (VALTREX) 1000 MG tablet; Take 1 tablet (1,000 mg total) by mouth 3 (three) times daily for 10 days.  Chronic right-sided low back pain without sciatica  Right SI joint pain seems a little better but new right groin and anterior leg pain. Seems like nerve pain. ? Shingles but no rash is present. Pain is severe. She is taking tylenol/ibuprofen/tramadol/muscle relaxer with minimal relief. Start  valtrex and gabapentin. Keep doing stretches. Follow up as needed if pain not improving or if symptoms changing.     Follow Up Instructions:    I discussed the assessment and treatment plan with the patient. The patient was provided an opportunity to ask questions and all were answered. The patient agreed with the plan and demonstrated an understanding of the instructions.   The patient was advised to call back or seek an in-person evaluation if the symptoms worsen or if the condition fails to improve as anticipated.   Iran Planas, PA-C

## 2020-08-03 ENCOUNTER — Other Ambulatory Visit (HOSPITAL_COMMUNITY): Payer: Self-pay

## 2020-08-03 ENCOUNTER — Encounter: Payer: Self-pay | Admitting: Physician Assistant

## 2020-08-03 DIAGNOSIS — R1031 Right lower quadrant pain: Secondary | ICD-10-CM | POA: Insufficient documentation

## 2020-08-03 DIAGNOSIS — M79651 Pain in right thigh: Secondary | ICD-10-CM | POA: Insufficient documentation

## 2020-08-04 ENCOUNTER — Encounter: Payer: 59 | Admitting: Rehabilitative and Restorative Service Providers"

## 2020-08-04 ENCOUNTER — Other Ambulatory Visit: Payer: Self-pay

## 2020-08-04 ENCOUNTER — Ambulatory Visit (INDEPENDENT_AMBULATORY_CARE_PROVIDER_SITE_OTHER): Payer: 59

## 2020-08-04 DIAGNOSIS — R200 Anesthesia of skin: Secondary | ICD-10-CM

## 2020-08-04 DIAGNOSIS — R202 Paresthesia of skin: Secondary | ICD-10-CM | POA: Diagnosis not present

## 2020-08-04 NOTE — Progress Notes (Signed)
Amanda [name],   Carotid doppler:  Right minimal plaque Left had more plaque but both less than 50 percent.

## 2020-08-06 ENCOUNTER — Ambulatory Visit (INDEPENDENT_AMBULATORY_CARE_PROVIDER_SITE_OTHER): Payer: 59 | Admitting: Sports Medicine

## 2020-08-06 ENCOUNTER — Other Ambulatory Visit: Payer: Self-pay

## 2020-08-06 DIAGNOSIS — G8929 Other chronic pain: Secondary | ICD-10-CM

## 2020-08-06 DIAGNOSIS — M545 Low back pain, unspecified: Secondary | ICD-10-CM

## 2020-08-06 MED ORDER — HYDROCODONE-ACETAMINOPHEN 5-325 MG PO TABS: 1.0000 | ORAL_TABLET | Freq: Three times a day (TID) | ORAL | 0 refills | Status: DC | PRN

## 2020-08-06 NOTE — Progress Notes (Addendum)
    Procedures performed today:    None.  Independent interpretation of notes and tests performed by another provider:   MRI reviewed, there is right L2 and L3 nerve root compression.  Brief History, Exam, Impression, and Recommendations:    Chronic right-sided low back pain without sciatica Amanda [name] returns, she is a pleasant 65 year old female, we initially treated [name] chronic right-sided low back pain with a sacroiliac joint injection, she did really well, and really does not have any more pain at [name] sacroiliac joint. Unfortunately she has started to complain of increasing discomfort described as stinging, numbness, tingling, achiness in the anterolateral right thigh but not past the knee. X-rays did show multilevel lumbar DDD and facet arthritis. On exam she does not have tenderness with palpation medial to the anterior suprailiac spine, negative lateral femoral cutaneous nerve Tinel sign. As some of [name] pain does come from the buttock and back I suspect this is related to lumbar spinal stenosis, she has had greater than 6 weeks of conservative treatment without improvement with regards to [name] hip pain, we are going to proceed with an MRI for epidural planning. Switching from tramadol to hydrocodone in the meantime.  Update: MRI reviewed, right L2 and L3 nerve root compression, proceeding with right L2-L3 and L3-L4 transforaminal epidurals.    ___________________________________________ Amanda [name]. Amanda [name], M.D., ABFM., CAQSM. Primary Care and [place] Instructor of [place] [employer] [facility] [employer] [facility] [employer]

## 2020-08-06 NOTE — Assessment & Plan Note (Addendum)
Soni returns, she is a pleasant 65 year old female, we initially treated her chronic right-sided low back pain with a sacroiliac joint injection, she did really well, and really does not have any more pain at her sacroiliac joint. Unfortunately she has started to complain of increasing discomfort described as stinging, numbness, tingling, achiness in the anterolateral right thigh but not past the knee. X-rays did show multilevel lumbar DDD and facet arthritis. On exam she does not have tenderness with palpation medial to the anterior suprailiac spine, negative lateral femoral cutaneous nerve Tinel sign. As some of her pain does come from the buttock and back I suspect this is related to lumbar spinal stenosis, she has had greater than 6 weeks of conservative treatment without improvement with regards to her hip pain, we are going to proceed with an MRI for epidural planning. Switching from tramadol to hydrocodone in the meantime.  Update: MRI reviewed, right L2 and L3 nerve root compression, proceeding with right L2-L3 and L3-L4 transforaminal epidurals.

## 2020-08-10 ENCOUNTER — Ambulatory Visit: Payer: 59 | Admitting: Sports Medicine

## 2020-08-11 ENCOUNTER — Other Ambulatory Visit: Payer: Self-pay

## 2020-08-11 ENCOUNTER — Other Ambulatory Visit (HOSPITAL_COMMUNITY): Payer: Self-pay

## 2020-08-11 DIAGNOSIS — G8929 Other chronic pain: Secondary | ICD-10-CM

## 2020-08-11 MED ORDER — HYDROCODONE-ACETAMINOPHEN 5-325 MG PO TABS
1.0000 | ORAL_TABLET | Freq: Three times a day (TID) | ORAL | 0 refills | Status: DC | PRN
  Filled 2020-08-11: qty 30, 10d supply, fill #0

## 2020-08-11 NOTE — Telephone Encounter (Signed)
Pt called asking for refill on hydrocodone. Pt still waiting for MRI approval.

## 2020-08-13 ENCOUNTER — Telehealth: Payer: Self-pay

## 2020-08-13 NOTE — Telephone Encounter (Signed)
Pt called and asked about the status of her MRI approval. Notified pt that MRI has been approved and she stated it's been scheduled for 08/14/20.

## 2020-08-14 ENCOUNTER — Ambulatory Visit (INDEPENDENT_AMBULATORY_CARE_PROVIDER_SITE_OTHER): Payer: 59

## 2020-08-14 ENCOUNTER — Other Ambulatory Visit: Payer: Self-pay

## 2020-08-14 DIAGNOSIS — R200 Anesthesia of skin: Secondary | ICD-10-CM | POA: Diagnosis not present

## 2020-08-14 DIAGNOSIS — M545 Low back pain, unspecified: Secondary | ICD-10-CM

## 2020-08-14 DIAGNOSIS — R202 Paresthesia of skin: Secondary | ICD-10-CM | POA: Diagnosis not present

## 2020-08-14 DIAGNOSIS — G8929 Other chronic pain: Secondary | ICD-10-CM | POA: Diagnosis not present

## 2020-08-16 ENCOUNTER — Other Ambulatory Visit: Payer: Self-pay | Admitting: Physician Assistant

## 2020-08-16 ENCOUNTER — Other Ambulatory Visit (HOSPITAL_COMMUNITY): Payer: Self-pay

## 2020-08-16 DIAGNOSIS — M79651 Pain in right thigh: Secondary | ICD-10-CM

## 2020-08-16 DIAGNOSIS — R1031 Right lower quadrant pain: Secondary | ICD-10-CM

## 2020-08-16 NOTE — Addendum Note (Signed)
Addended by: Silverio Decamp on: 08/16/2020 09:34 AM   Modules accepted: Orders

## 2020-08-20 ENCOUNTER — Other Ambulatory Visit: Payer: Self-pay | Admitting: Sports Medicine

## 2020-08-20 ENCOUNTER — Other Ambulatory Visit: Payer: Self-pay

## 2020-08-20 ENCOUNTER — Ambulatory Visit
Admission: RE | Admit: 2020-08-20 | Discharge: 2020-08-20 | Disposition: A | Discharge status: Home / Self Care | Payer: 59 | Source: Ambulatory Visit | Attending: Sports Medicine | Admitting: Sports Medicine

## 2020-08-20 DIAGNOSIS — M545 Low back pain, unspecified: Secondary | ICD-10-CM

## 2020-08-20 DIAGNOSIS — G8929 Other chronic pain: Secondary | ICD-10-CM

## 2020-08-20 MED ORDER — METHYLPREDNISOLONE ACETATE 40 MG/ML INJ SUSP (RADIOLOG
80.0000 mg | Freq: Once | INTRAMUSCULAR | Status: AC
  Administered 2020-08-20: 80 mg via EPIDURAL

## 2020-08-20 MED ORDER — IOPAMIDOL (ISOVUE-M 200) INJECTION 41%
1.0000 mL | Freq: Once | INTRAMUSCULAR | Status: AC
  Administered 2020-08-20: 1 mL via EPIDURAL

## 2020-08-20 NOTE — Discharge Instructions (Signed)

## 2020-08-21 ENCOUNTER — Other Ambulatory Visit (HOSPITAL_COMMUNITY): Payer: Self-pay

## 2020-08-25 ENCOUNTER — Other Ambulatory Visit: Payer: Self-pay | Admitting: Sports Medicine

## 2020-08-25 ENCOUNTER — Other Ambulatory Visit (HOSPITAL_COMMUNITY): Payer: Self-pay

## 2020-08-25 DIAGNOSIS — M545 Low back pain, unspecified: Secondary | ICD-10-CM

## 2020-08-25 DIAGNOSIS — G8929 Other chronic pain: Secondary | ICD-10-CM

## 2020-08-25 MED ORDER — HYDROCODONE-ACETAMINOPHEN 5-325 MG PO TABS
1.0000 | ORAL_TABLET | Freq: Three times a day (TID) | ORAL | 0 refills | Status: DC | PRN
  Filled 2020-08-25: qty 30, 10d supply, fill #0

## 2020-09-02 ENCOUNTER — Other Ambulatory Visit: Payer: Self-pay | Admitting: Physician Assistant

## 2020-09-02 ENCOUNTER — Other Ambulatory Visit (HOSPITAL_COMMUNITY): Payer: Self-pay

## 2020-09-02 DIAGNOSIS — R1031 Right lower quadrant pain: Secondary | ICD-10-CM

## 2020-09-02 DIAGNOSIS — M79651 Pain in right thigh: Secondary | ICD-10-CM

## 2020-09-02 MED ORDER — GABAPENTIN 100 MG PO CAPS
ORAL_CAPSULE | ORAL | 0 refills | Status: DC
Start: 2020-09-02 — End: 2021-01-10
  Filled 2020-09-02: qty 90, 10d supply, fill #0

## 2020-09-19 ENCOUNTER — Encounter: Payer: Self-pay | Admitting: Physician Assistant

## 2020-09-27 ENCOUNTER — Other Ambulatory Visit (HOSPITAL_COMMUNITY): Payer: Self-pay

## 2020-10-23 ENCOUNTER — Other Ambulatory Visit (HOSPITAL_COMMUNITY): Payer: Self-pay

## 2020-10-24 ENCOUNTER — Other Ambulatory Visit: Payer: Self-pay | Admitting: Physician Assistant

## 2020-10-24 DIAGNOSIS — I10 Essential (primary) hypertension: Secondary | ICD-10-CM

## 2020-10-25 ENCOUNTER — Other Ambulatory Visit: Payer: Self-pay | Admitting: Physician Assistant

## 2020-10-25 ENCOUNTER — Other Ambulatory Visit (HOSPITAL_COMMUNITY): Payer: Self-pay

## 2020-10-25 DIAGNOSIS — I10 Essential (primary) hypertension: Secondary | ICD-10-CM

## 2020-10-25 MED FILL — Spironolactone Tab 25 MG: ORAL | 90 days supply | Qty: 90 | Fill #0 | Status: AC

## 2020-10-26 ENCOUNTER — Other Ambulatory Visit (HOSPITAL_COMMUNITY): Payer: Self-pay

## 2020-10-28 ENCOUNTER — Other Ambulatory Visit (HOSPITAL_COMMUNITY): Payer: Self-pay

## 2020-10-29 ENCOUNTER — Other Ambulatory Visit (HOSPITAL_COMMUNITY): Payer: Self-pay

## 2020-11-02 ENCOUNTER — Other Ambulatory Visit (HOSPITAL_COMMUNITY): Payer: Self-pay

## 2020-11-02 ENCOUNTER — Other Ambulatory Visit: Payer: Self-pay | Admitting: Physician Assistant

## 2020-11-02 MED ORDER — METHOCARBAMOL 500 MG PO TABS
ORAL_TABLET | ORAL | 1 refills | Status: DC
  Filled 2020-11-02: qty 120, 30d supply, fill #0
  Filled 2021-02-01: qty 120, 30d supply, fill #1

## 2020-12-06 ENCOUNTER — Other Ambulatory Visit (HOSPITAL_COMMUNITY): Payer: Self-pay

## 2021-01-01 ENCOUNTER — Other Ambulatory Visit (HOSPITAL_COMMUNITY): Payer: Self-pay

## 2021-01-02 ENCOUNTER — Other Ambulatory Visit (HOSPITAL_COMMUNITY): Payer: Self-pay

## 2021-01-03 ENCOUNTER — Other Ambulatory Visit (HOSPITAL_COMMUNITY): Payer: Self-pay

## 2021-01-07 ENCOUNTER — Ambulatory Visit: Payer: 59 | Admitting: Physician Assistant

## 2021-01-10 ENCOUNTER — Encounter: Payer: Self-pay | Admitting: Physician Assistant

## 2021-01-10 ENCOUNTER — Ambulatory Visit (INDEPENDENT_AMBULATORY_CARE_PROVIDER_SITE_OTHER): Payer: Medicare Other | Admitting: Physician Assistant

## 2021-01-10 ENCOUNTER — Other Ambulatory Visit: Payer: Self-pay

## 2021-01-10 ENCOUNTER — Other Ambulatory Visit (HOSPITAL_COMMUNITY): Payer: Self-pay

## 2021-01-10 VITALS — BP 124/60 | HR 73 | Temp 97.9°F | Ht 68.0 in | Wt 215.0 lb

## 2021-01-10 DIAGNOSIS — R7303 Prediabetes: Secondary | ICD-10-CM

## 2021-01-10 DIAGNOSIS — I10 Essential (primary) hypertension: Secondary | ICD-10-CM

## 2021-01-10 DIAGNOSIS — R413 Other amnesia: Secondary | ICD-10-CM

## 2021-01-10 DIAGNOSIS — N3281 Overactive bladder: Secondary | ICD-10-CM

## 2021-01-10 DIAGNOSIS — M1A4720 Other secondary chronic gout, left ankle and foot, without tophus (tophi): Secondary | ICD-10-CM

## 2021-01-10 DIAGNOSIS — Z13220 Encounter for screening for lipoid disorders: Secondary | ICD-10-CM

## 2021-01-10 DIAGNOSIS — R1031 Right lower quadrant pain: Secondary | ICD-10-CM

## 2021-01-10 DIAGNOSIS — M545 Low back pain, unspecified: Secondary | ICD-10-CM | POA: Diagnosis not present

## 2021-01-10 DIAGNOSIS — G8929 Other chronic pain: Secondary | ICD-10-CM

## 2021-01-10 DIAGNOSIS — M79651 Pain in right thigh: Secondary | ICD-10-CM

## 2021-01-10 DIAGNOSIS — Z1329 Encounter for screening for other suspected endocrine disorder: Secondary | ICD-10-CM

## 2021-01-10 MED ORDER — ALLOPURINOL 100 MG PO TABS
ORAL_TABLET | Freq: Every day | ORAL | 2 refills | Status: DC
  Filled 2021-01-10: qty 90, fill #0
  Filled 2021-04-10: qty 90, 90d supply, fill #0
  Filled 2021-07-17: qty 90, 90d supply, fill #1
  Filled 2021-10-17: qty 90, 90d supply, fill #2

## 2021-01-10 MED ORDER — MIRABEGRON ER 50 MG PO TB24
50.0000 mg | ORAL_TABLET | Freq: Every day | ORAL | 1 refills | Status: DC
  Filled 2021-01-10: qty 90, 90d supply, fill #0

## 2021-01-10 MED ORDER — GABAPENTIN 100 MG PO CAPS
ORAL_CAPSULE | ORAL | 3 refills | Status: DC
Start: 2021-01-10 — End: 2021-03-07
  Filled 2021-01-10: qty 90, 10d supply, fill #0
  Filled 2021-02-01: qty 90, 10d supply, fill #1

## 2021-01-10 MED ORDER — LOSARTAN POTASSIUM 50 MG PO TABS
50.0000 mg | ORAL_TABLET | Freq: Every day | ORAL | 1 refills | Status: DC
  Filled 2021-01-10: qty 90, 90d supply, fill #0
  Filled 2021-04-30: qty 90, 90d supply, fill #1

## 2021-01-10 NOTE — Progress Notes (Signed)
Subjective:    Patient ID: Amanda [name], female    DOB: 05-Jun-1955, 65 y.o.   MRN: [id number]  HPI Pt is a 65 yo female with HTN, bilateral OA knees, chronic right sided low back pain, gout who presents to the clinic for follow up.   She denies any CP, palpitations, headaches, dizziness or vision changes. She is taking losartan daily.   Memory changes are stable. Continues to have some word finding difficultly but not concerning.   Chronic back pain much better after injections and PT. Not using gabapentin very much but would like refills.   She is having a lot of OAB symptoms. She is leaking a lot and having to go frequently. No pain with urination.  ..  .. Active Ambulatory Problems    Diagnosis Date Noted   HYPERCHOLESTEROLEMIA 09/05/2006   Factor V deficiency ([place]) 09/05/2006   Essential hypertension 05/16/2007   DIVERTICULOSIS, COLON 10/18/2006   Constipation 06/21/2009   FATTY LIVER DISEASE 09/12/2007   ARTHRITIS 05/16/2007   LEG CRAMPS 07/17/2009   Obesity 05/23/2010   Status post laparoscopic sleeve gastrectomy 03/04/2018   Bilateral primary osteoarthritis of knee 03/05/2018   Gout 03/05/2018   Chronic pain of both knees 03/05/2018   Hammer toe of left foot 07/23/2018   Epigastric pain 07/23/2018   Onychomycosis 07/23/2018   OA (osteoarthritis) of knee 07/21/2019   Memory changes 07/12/2020   Right hip pain 07/12/2020   Chronic right-sided low back pain without sciatica 07/12/2020   Numbness and tingling of right side of face 07/12/2020   Osteoporosis 07/12/2020   Pap smear abnormality of cervix/human papillomavirus (HPV) positive 07/12/2020   Groin pain, right 08/03/2020   Resolved Ambulatory Problems    Diagnosis Date Noted   Acute cystitis 07/17/2009   SLEEP APNEA 10/25/2007   Acute pain of right thigh 08/03/2020   Past Medical History:  Diagnosis Date   Allergy    Arthritis    Blood dyscrasia    CPAP (continuous positive airway pressure)  dependence    Diabetes mellitus without complication (HCC)    Fatty liver 4-08   Hip pain    Hypertension    Knee pain    Pneumonia    Recurrent UTI    Sleep apnea         Review of Systems See HPI.     Objective:   Physical Exam Vitals reviewed.  Constitutional:      Appearance: Normal appearance. She is obese.  HENT:     Head: Normocephalic.  Neck:     Vascular: No carotid bruit.  Cardiovascular:     Rate and Rhythm: Normal rate and regular rhythm.     Pulses: Normal pulses.     Heart sounds: Normal heart sounds.  Pulmonary:     Effort: Pulmonary effort is normal.     Breath sounds: Normal breath sounds.  Abdominal:     General: There is no distension.     Palpations: Abdomen is soft.     Tenderness: There is no right CVA tenderness or left CVA tenderness.  Neurological:     General: No focal deficit present.     Mental Status: She is alert and oriented to person, place, and time.  Psychiatric:        Mood and Affect: Mood normal.     .. MMSE - Mini Mental State Exam 01/10/2021 07/07/2020 02/11/2020  Orientation to time 4 5 5   Orientation to Place 5 5 4   Registration 3  3 3  Attention/ Calculation 5 5 5   Recall 3 3 3   Language- name 2 objects 2 2 2   Language- repeat 1 1 1   Language- follow 3 step command 1 3 3   Language- read & follow direction 1 1 1   Write a sentence 1 1 1   Copy design 1 1 1   Total score 27 30 29    .. Depression screen [facility] 2/9 01/10/2021 07/07/2020 07/22/2018  Decreased Interest 1 1 1   Down, Depressed, Hopeless 1 1 1   PHQ - 2 Score 2 2 2   Altered sleeping 0 0 0  Tired, decreased energy 1 1 1   Change in appetite 0 2 1  Feeling bad or failure about yourself  1 1 1   Trouble concentrating 0 0 0  Moving slowly or fidgety/restless 0 0 0  Suicidal thoughts 0 0 0  PHQ-9 Score 4 6 5   Difficult doing work/chores Somewhat difficult Somewhat difficult Somewhat difficult   .Marland Kitchen GAD 7 : Generalized Anxiety Score 01/10/2021 07/07/2020 07/22/2018   Nervous, Anxious, on Edge 0 1 0  Control/stop worrying 1 1 1   Worry too much - different things 1 1 1   Trouble relaxing 0 0 0  Restless 0 0 0  Easily annoyed or irritable 0 1 0  Afraid - awful might happen 1 1 1   Total GAD 7 Score 3 5 3   Anxiety Difficulty Somewhat difficult Somewhat difficult Somewhat difficult   .Marland Kitchen MMSE - Mini Mental State Exam 01/10/2021 07/07/2020 02/11/2020  Orientation to time 4 5 5   Orientation to Place 5 5 4   Registration 3 3 3   Attention/ Calculation 5 5 5   Recall 3 3 3   Language- name 2 objects 2 2 2   Language- repeat 1 1 1   Language- follow 3 step command 1 3 3   Language- read & follow direction 1 1 1   Write a sentence 1 1 1   Copy design 1 1 1   Total score 27 30 29          Assessment & Plan:  .Marland KitchenLaekyn was seen today for hypertension and memory changes.  Diagnoses and all orders for this visit:  Essential hypertension -     losartan (COZAAR) 50 MG tablet; Take 1 tablet (50 mg total) by mouth daily. -     COMPLETE METABOLIC PANEL WITH GFR  Memory changes  Chronic right-sided low back pain without sciatica -     gabapentin (NEURONTIN) 100 MG capsule; Take 1 to 3 capsules by mouth up to three times a day for pain.  Groin pain, right -     gabapentin (NEURONTIN) 100 MG capsule; Take 1 to 3 capsules by mouth up to three times a day for pain.  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  Pt needed fasting labs.  Covid vaccine x2.  Flu shot UTD.  Shingles and pneumonia UTD.   BP great. Refilled cozaar. CMP ordered.   Gabapentin refilled for  low back pain.   Started myrbetriq for  OAB symptoms.   Memory went down 2 points. Will continue to follow every 3-6 months.

## 2021-01-11 ENCOUNTER — Other Ambulatory Visit (HOSPITAL_COMMUNITY): Payer: Self-pay

## 2021-01-22 ENCOUNTER — Other Ambulatory Visit (HOSPITAL_COMMUNITY): Payer: Self-pay

## 2021-02-01 ENCOUNTER — Other Ambulatory Visit: Payer: Self-pay | Admitting: Physician Assistant

## 2021-02-01 ENCOUNTER — Other Ambulatory Visit (HOSPITAL_COMMUNITY): Payer: Self-pay

## 2021-02-01 DIAGNOSIS — I10 Essential (primary) hypertension: Secondary | ICD-10-CM

## 2021-02-01 MED ORDER — SPIRONOLACTONE 25 MG PO TABS
ORAL_TABLET | Freq: Every day | ORAL | 0 refills | Status: DC
  Filled 2021-02-01: qty 90, 90d supply, fill #0

## 2021-02-02 ENCOUNTER — Other Ambulatory Visit (HOSPITAL_COMMUNITY): Payer: Self-pay

## 2021-03-04 ENCOUNTER — Other Ambulatory Visit (HOSPITAL_COMMUNITY): Payer: Self-pay

## 2021-03-04 ENCOUNTER — Encounter: Payer: Medicare Other | Admitting: Physician Assistant

## 2021-03-07 ENCOUNTER — Other Ambulatory Visit: Payer: Self-pay

## 2021-03-07 ENCOUNTER — Other Ambulatory Visit (HOSPITAL_COMMUNITY): Payer: Self-pay

## 2021-03-07 ENCOUNTER — Encounter: Payer: Self-pay | Admitting: Physician Assistant

## 2021-03-07 ENCOUNTER — Ambulatory Visit (INDEPENDENT_AMBULATORY_CARE_PROVIDER_SITE_OTHER): Payer: Medicare Other | Admitting: Physician Assistant

## 2021-03-07 VITALS — BP 128/67 | HR 87 | Ht 67.0 in | Wt 217.1 lb

## 2021-03-07 DIAGNOSIS — Z23 Encounter for immunization: Secondary | ICD-10-CM

## 2021-03-07 DIAGNOSIS — Z131 Encounter for screening for diabetes mellitus: Secondary | ICD-10-CM | POA: Diagnosis not present

## 2021-03-07 DIAGNOSIS — I10 Essential (primary) hypertension: Secondary | ICD-10-CM

## 2021-03-07 DIAGNOSIS — N3946 Mixed incontinence: Secondary | ICD-10-CM

## 2021-03-07 DIAGNOSIS — R350 Frequency of micturition: Secondary | ICD-10-CM

## 2021-03-07 DIAGNOSIS — Z1159 Encounter for screening for other viral diseases: Secondary | ICD-10-CM

## 2021-03-07 DIAGNOSIS — N3281 Overactive bladder: Secondary | ICD-10-CM

## 2021-03-07 DIAGNOSIS — Z0000 Encounter for general adult medical examination without abnormal findings: Secondary | ICD-10-CM | POA: Diagnosis not present

## 2021-03-07 DIAGNOSIS — M545 Low back pain, unspecified: Secondary | ICD-10-CM

## 2021-03-07 DIAGNOSIS — M1009 Idiopathic gout, multiple sites: Secondary | ICD-10-CM

## 2021-03-07 DIAGNOSIS — G8929 Other chronic pain: Secondary | ICD-10-CM

## 2021-03-07 DIAGNOSIS — J0100 Acute maxillary sinusitis, unspecified: Secondary | ICD-10-CM

## 2021-03-07 DIAGNOSIS — R1031 Right lower quadrant pain: Secondary | ICD-10-CM

## 2021-03-07 MED ORDER — GABAPENTIN 100 MG PO CAPS
ORAL_CAPSULE | ORAL | 3 refills | Status: DC
  Filled 2021-03-07: qty 90, 10d supply, fill #0

## 2021-03-07 MED ORDER — SPIRONOLACTONE 25 MG PO TABS
ORAL_TABLET | Freq: Every day | ORAL | 0 refills | Status: DC
  Filled 2021-03-07 – 2021-04-30 (×2): qty 90, 90d supply, fill #0

## 2021-03-07 MED ORDER — AZITHROMYCIN 250 MG PO TABS
ORAL_TABLET | ORAL | 0 refills | Status: DC
  Filled 2021-03-07: qty 6, 5d supply, fill #0

## 2021-03-07 NOTE — Progress Notes (Addendum)
Subjective:    Amanda [name] is a 66 y.o. female who presents for a Welcome to Medicare exam.   Review of Systems  Pt continues to have urinary symptoms of frequency and leakage with every urination. Myrbetriq not working at all. Wearing pads daily.   Having lots of sinus pressure, congestion, runny nose for the last 2 weeks. Thought it was getting better then got worse. Extremely congested. Using OTC cold and sinus medications and allergy medication.          Objective:    Today's Vitals   03/07/21 0856  BP: 128/67  Pulse: 87  SpO2: 97%  Weight: 217 lb 1.3 oz (98.5 kg)  Height: 5\' 7"  (1.702 m)  Body mass index is 34 kg/m.  Medications Outpatient Encounter Medications as of 03/07/2021  Medication Sig   alendronate (FOSAMAX) 70 MG tablet Take 1 tablet (70 mg total) by mouth every 7 (seven) days.   allopurinol (ZYLOPRIM) 100 MG tablet TAKE 1 TABLET BY MOUTH ONCE DAILY   atorvastatin (LIPITOR) 40 MG tablet Take 1 tablet (40 mg total) by mouth daily.   azithromycin (ZITHROMAX Z-PAK) 250 MG tablet Take 2 tablets (500 mg) on day 1,  followed by 1 tablet once daily on days 2 through 5.   famotidine (PEPCID) 10 MG tablet Take 10 mg by mouth 2 (two) times daily.   ferrous sulfate 324 MG TBEC Take 324 mg by mouth.   ibuprofen (ADVIL) 800 MG tablet Take 800 mg by mouth every 8 (eight) hours as needed.   losartan (COZAAR) 50 MG tablet Take 1 tablet (50 mg total) by mouth daily.   [DISCONTINUED] gabapentin (NEURONTIN) 100 MG capsule Take 1 to 3 capsules by mouth up to three times a day for pain.   [DISCONTINUED] HYDROcodone-acetaminophen (NORCO/VICODIN) 5-325 MG tablet Take 1 tablet by mouth every 8 (eight) hours as needed for moderate pain.   [DISCONTINUED] methocarbamol (ROBAXIN) 500 MG tablet TAKE 1 TABLET BY MOUTH 4 TIMES DAILY.   [DISCONTINUED] mirabegron ER (MYRBETRIQ) 50 MG TB24 tablet Take 1 tablet (50 mg total) by mouth daily.   [DISCONTINUED] spironolactone (ALDACTONE) 25  MG tablet TAKE 1 TABLET BY MOUTH ONCE DAILY   gabapentin (NEURONTIN) 100 MG capsule Take 1 to 3 capsules by mouth up to three times a day for pain.   spironolactone (ALDACTONE) 25 MG tablet TAKE 1 TABLET BY MOUTH ONCE DAILY   [DISCONTINUED] fexofenadine (ALLEGRA) 180 MG tablet Take 180 mg by mouth daily.  (Patient not taking: Reported on 03/07/2021)   No facility-administered encounter medications on file as of 03/07/2021.     History: Past Medical History:  Diagnosis Date   Allergy    seasonal   Arthritis    all over   Blood dyscrasia    Carrys trait for Leiden Factor five never had any issues . Yhe only reason she was tested was because her mother had it   CPAP (continuous positive airway pressure) dependence    8 cm water w/ heated humidifier   Diabetes mellitus without complication ([place])    Pre surgery - Gastric sx 2014 R[place]-Y no diabetes since  no meds   Fatty liver 4-08   by [place]   Gout    Hip pain    Hypertension    Knee pain    Obesity    Pneumonia    Recurrent UTI    Sleep apnea    had roux en y lost 140lbs no longer needs cpap  Past Surgical History:  Procedure Laterality Date   APPENDECTOMY  1977   CESAREAN SECTION  1989   one   [place] Bilateral 1985   ROUX-EN-Y GASTRIC BYPASS  2014   TOTAL KNEE ARTHROPLASTY Right 07/21/2019   Procedure: TOTAL KNEE ARTHROPLASTY;  Surgeon: Gaynelle Arabian, MD;  Location: [facility];  Service: Orthopedics;  Laterality: Right;  58min    Family History  Problem Relation Age of Onset   Breast cancer Mother    Diabetes Mother    Hypertension Mother    Cancer Mother        breast- partial mastectomy- in her 50's   Heart attack Father    Hypertension Father    Arthritis Father    Gout Father    Parkinsonism Father    Parkinson's disease Father    Alcohol abuse Maternal Grandfather    Colon cancer Neg Hx    Esophageal cancer Neg Hx    Stomach  cancer Neg Hx    Rectal cancer Neg Hx    Social History   Occupational History   Not on file  Tobacco Use   Smoking status: Former    Years: 15.00    Types: Cigarettes    Quit date: 2001    Years since quitting: 22.0   Smokeless tobacco: Never  Vaping Use   Vaping Use: Never used  Substance and Sexual Activity   Alcohol use: Yes    Comment: occasional  once a month   Drug use: No   Sexual activity: Not Currently    Birth control/protection: Post-menopausal    Tobacco Counseling Counseling given: Not Answered   Immunizations and Health Maintenance Immunization History  Administered Date(s) Administered   Influenza Split 11/13/2011, 12/06/2012   Influenza Whole 11/06/2005, 11/16/2008   Influenza, Seasonal, Injecte, Preservative Fre 11/15/2015, 11/16/2016   Influenza,inj,quad, With Preservative 11/06/2017, 11/07/2018   Influenza-Unspecified 11/17/2013, 11/15/2015, 11/23/2016, 11/25/2018, 11/07/2019, 12/06/2020   PFIZER(Purple Top)SARS-COV-2 Vaccination 03/26/2019, 04/17/2019   Pneumococcal Polysaccharide-23 04/12/2007, 03/07/2021   Td 02/06/2001   Tdap 02/07/2003, 05/06/2013, 07/22/2018   Zoster Recombinat (Shingrix) 07/22/2018, 10/02/2018   There are no preventive care reminders to display for this patient.   Activities of Daily Living In your present state of health, do you have any difficulty performing the following activities: 03/07/2021  Hearing? N  Vision? N  Difficulty concentrating or making decisions? Y  Comment MMSE/6CIT look fine  Walking or climbing stairs? Y  Comment OA in knees  Dressing or bathing? N  Doing errands, shopping? N  Some recent data might be hidden    Physical Exam  NSR Normal auscultation of lungs  Advanced Directives: pt given information.      Assessment:    This is a routine wellness examination for this patient .    Dietary issues and exercise activities discussed:    Discussed walking 150 minutes a week and keeping  a diet low if fat,carbs, sugars.   Depression Screen PHQ 2/9 Scores 03/07/2021 01/10/2021 07/07/2020 07/22/2018  PHQ - 2 Score 2 2 2 2   PHQ- 9 Score 6 4 6 5      Fall Risk Fall Risk  03/07/2021  Falls in the past year? 1  Number falls in past yr: 0  Injury with Fall? 0  Risk for fall due to : -  Follow up -    Cognitive Function: MMSE - Mini Mental State Exam 01/10/2021 07/07/2020  02/11/2020  Orientation to time 4 5 5   Orientation to Place 5 5 4   Registration 3 3 3   Attention/ Calculation 5 5 5   Recall 3 3 3   Language- name 2 objects 2 2 2   Language- repeat 1 1 1   Language- follow 3 step command 1 3 3   Language- read & follow direction 1 1 1   Write a sentence 1 1 1   Copy design 1 1 1   Total score 27 30 29      6CIT Screen 03/07/2021  What Year? 0 points  What month? 0 points  What time? 0 points  Count back from 20 0 points  Months in reverse 0 points  Repeat phrase 0 points  Total Score 0    Patient Care Team: Lavada Mesi as PCP - General (Family Medicine)     Plan:   [place]. Concerned about SE of other anti-choleragenics. Will make urology referral.   Pneumonia 23 vaccine given today.  Flu and shingrix UTD.  Covid vaccine x2 but declines booster.  Mammogram/bone density/colonoscopy UTD.   Allergic to PCN. Treated with zpak for sinusitis.  Discussed symptomatic care.     I have personally reviewed and noted the following in the patients chart:   Medical and social history Use of alcohol, tobacco or illicit drugs  Current medications and supplements Functional ability and status Nutritional status Physical activity Advanced directives List of other physicians Hospitalizations, surgeries, and ER visits in previous 12 months Vitals Screenings to include cognitive, depression, and falls Referrals and appointments  In addition, I have reviewed and discussed with patient certain preventive protocols, quality metrics, and best practice  recommendations. A written personalized care plan for preventive services as well as general preventive health recommendations were provided to patient.     Iran Planas, PA-C 03/07/2021

## 2021-03-07 NOTE — Patient Instructions (Signed)
Health Maintenance After Age 65 After age 65, you are at a higher risk for certain long-term diseases and infections as well as injuries from falls. Falls are a major cause of broken bones and head injuries in people who are older than age 65. Getting regular preventive care can help to keep you healthy and well. Preventive care includes getting regular testing and making lifestyle changes as recommended by your health care provider. Talk with your health care provider about: Which screenings and tests you should have. A screening is a test that checks for a disease when you have no symptoms. A diet and exercise plan that is right for you. What should I know about screenings and tests to prevent falls? Screening and testing are the best ways to find a health problem early. Early diagnosis and treatment give you the best chance of managing medical conditions that are common after age 65. Certain conditions and lifestyle choices may make you more likely to have a fall. Your health care provider may recommend: Regular vision checks. Poor vision and conditions such as cataracts can make you more likely to have a fall. If you wear glasses, make sure to get your prescription updated if your vision changes. Medicine review. Work with your health care provider to regularly review all of the medicines you are taking, including over-the-counter medicines. Ask your health care provider about any side effects that may make you more likely to have a fall. Tell your health care provider if any medicines that you take make you feel dizzy or sleepy. Strength and balance checks. Your health care provider may recommend certain tests to check your strength and balance while standing, walking, or changing positions. Foot health exam. Foot pain and numbness, as well as not wearing proper footwear, can make you more likely to have a fall. Screenings, including: Osteoporosis screening. Osteoporosis is a condition that causes  the bones to get weaker and break more easily. Blood pressure screening. Blood pressure changes and medicines to control blood pressure can make you feel dizzy. Depression screening. You may be more likely to have a fall if you have a fear of falling, feel depressed, or feel unable to do activities that you used to do. Alcohol use screening. Using too much alcohol can affect your balance and may make you more likely to have a fall. Follow these instructions at home: Lifestyle Do not drink alcohol if: Your health care provider tells you not to drink. If you drink alcohol: Limit how much you have to: 0-1 drink a day for women. 0-2 drinks a day for men. Know how much alcohol is in your drink. In the [place] one drink equals one 12 oz bottle of beer (355 mL), one 5 oz glass of wine (148 mL), or one 1 oz glass of hard liquor (44 mL). Do not use any products that contain nicotine or tobacco. These products include cigarettes, chewing tobacco, and vaping devices, such as e-cigarettes. If you need help quitting, ask your health care provider. Activity  Follow a regular exercise program to stay fit. This will help you maintain your balance. Ask your health care provider what types of exercise are appropriate for you. If you need a cane or walker, use it as recommended by your health care provider. Wear supportive shoes that have nonskid soles. Safety  Remove any tripping hazards, such as rugs, cords, and clutter. Install safety equipment such as grab bars in bathrooms and safety rails on stairs. Keep rooms and walkways   well-lit. General instructions Talk with your health care provider about your risks for falling. Tell your health care provider if: You fall. Be sure to tell your health care provider about all falls, even ones that seem minor. You feel dizzy, tiredness (fatigue), or off-balance. Take over-the-counter and prescription medicines only as told by your health care provider. These include  supplements. Eat a healthy diet and maintain a healthy weight. A healthy diet includes low-fat dairy products, low-fat (lean) meats, and fiber from whole grains, beans, and lots of fruits and vegetables. Stay current with your vaccines. Schedule regular health, dental, and eye exams. Summary Having a healthy lifestyle and getting preventive care can help to protect your health and wellness after age 65. Screening and testing are the best way to find a health problem early and help you avoid having a fall. Early diagnosis and treatment give you the best chance for managing medical conditions that are more common for people who are older than age 65. Falls are a major cause of broken bones and head injuries in people who are older than age 65. Take precautions to prevent a fall at home. Work with your health care provider to learn what changes you can make to improve your health and wellness and to prevent falls. This information is not intended to replace advice given to you by your health care provider. Make sure you discuss any questions you have with your health care provider. Document Revised: 06/14/2020 Document Reviewed: 06/14/2020 Elsevier Patient Education  2022 [employer]vier Inc.  

## 2021-03-12 LAB — COMPLETE METABOLIC PANEL WITH GFR
AG Ratio: 1.4 (calc) (ref 1.0–2.5)
ALT: 19 U/L (ref 6–29)
AST: 17 U/L (ref 10–35)
Albumin: 3.9 g/dL (ref 3.6–5.1)
Alkaline phosphatase (APISO): 63 U/L (ref 37–153)
BUN: 16 mg/dL (ref 7–25)
CO2: 27 mmol/L (ref 20–32)
Calcium: 9.7 mg/dL (ref 8.6–10.4)
Chloride: 109 mmol/L (ref 98–110)
Creat: 1.01 mg/dL (ref 0.50–1.05)
Globulin: 2.7 g/dL (calc) (ref 1.9–3.7)
Glucose, Bld: 166 mg/dL — ABNORMAL HIGH (ref 65–99)
Potassium: 4.4 mmol/L (ref 3.5–5.3)
Sodium: 144 mmol/L (ref 135–146)
Total Bilirubin: 0.6 mg/dL (ref 0.2–1.2)
Total Protein: 6.6 g/dL (ref 6.1–8.1)
eGFR: 62 mL/min/{1.73_m2} (ref >=60)

## 2021-03-12 LAB — HEPATITIS C ANTIBODY
Hepatitis C Ab: NONREACTIVE
SIGNAL TO CUT-OFF: 0.03 (ref <1.00)

## 2021-03-12 LAB — HEMOGLOBIN A1C
Hgb A1c MFr Bld: 7.1 % of total Hgb — ABNORMAL HIGH (ref <5.7)
Mean Plasma Glucose: 157 mg/dL
eAG (mmol/L): 8.7 mmol/L

## 2021-03-12 LAB — TSH: TSH: 0.81 mIU/L (ref 0.40–4.50)

## 2021-03-12 LAB — LIPID PANEL W/REFLEX DIRECT LDL
Cholesterol: 102 mg/dL (ref <200)
HDL: 43 mg/dL — ABNORMAL LOW (ref >=50)
LDL Cholesterol (Calc): 34 mg/dL (calc)
Non-HDL Cholesterol (Calc): 59 mg/dL (calc) (ref <130)
Total CHOL/HDL Ratio: 2.4 (calc) (ref <5.0)
Triglycerides: 175 mg/dL — ABNORMAL HIGH (ref <150)

## 2021-03-12 LAB — URIC ACID: Uric Acid, Serum: 5.1 mg/dL (ref 2.5–7.0)

## 2021-03-14 NOTE — Progress Notes (Signed)
A1C is up to 7.1. we need an in person or virtual to discuss plan and management of this.  LDL to goal.  TG elevated but would likely decrease once sugars decrease.  Kidney and liver look good.  Thyroid looks great.  Uric acid is nice a low but I would stay on preventive.

## 2021-03-18 ENCOUNTER — Other Ambulatory Visit (HOSPITAL_COMMUNITY): Payer: Self-pay

## 2021-03-18 ENCOUNTER — Encounter: Payer: Self-pay | Admitting: Physician Assistant

## 2021-03-18 ENCOUNTER — Telehealth (INDEPENDENT_AMBULATORY_CARE_PROVIDER_SITE_OTHER): Payer: Medicare Other | Admitting: Physician Assistant

## 2021-03-18 VITALS — Ht 67.0 in | Wt 217.0 lb

## 2021-03-18 DIAGNOSIS — I10 Essential (primary) hypertension: Secondary | ICD-10-CM | POA: Diagnosis not present

## 2021-03-18 DIAGNOSIS — E1165 Type 2 diabetes mellitus with hyperglycemia: Secondary | ICD-10-CM | POA: Diagnosis not present

## 2021-03-18 MED ORDER — OZEMPIC (0.25 OR 0.5 MG/DOSE) 2 MG/1.5ML ~~LOC~~ SOPN
0.5000 mg | PEN_INJECTOR | SUBCUTANEOUS | 2 refills | Status: DC
  Filled 2021-03-18: qty 1.5, 28d supply, fill #0
  Filled 2021-04-18: qty 1.5, 28d supply, fill #1

## 2021-03-18 NOTE — Progress Notes (Signed)
Pt is calling to follow up on her lab work

## 2021-03-18 NOTE — Progress Notes (Signed)
..Virtual Visit via telephone  I connected with Amanda [name] on 03/18/21 at 10:50 AM EST by a telephone and verified that I am speaking with the correct person using two identifiers.  Location: Patient: car Provider: clinic  Participating in visit:  Patient: Amanda [name] Provider: Iran Planas PA-C   I discussed the limitations of evaluation and management by telemedicine and the availability of in person appointments. The patient expressed understanding and agreed to proceed.  History of Present Illness: Pt is a 66 yo female who calls to discuss labs from physical.   A1C was 7.1. LDL 34. TSH .81. Uric acid 5.1.   Marland Kitchen. 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   Essential hypertension 05/16/2007   DIVERTICULOSIS, COLON 10/18/2006   Constipation 06/21/2009   FATTY LIVER DISEASE 09/12/2007   ARTHRITIS 05/16/2007   LEG CRAMPS 07/17/2009   Obesity 05/23/2010   Status post laparoscopic sleeve gastrectomy 03/04/2018   Bilateral primary osteoarthritis of knee 03/05/2018   Gout 03/05/2018   Chronic pain of both knees 03/05/2018   Hammer toe of left foot 07/23/2018   Epigastric pain 07/23/2018   Onychomycosis 07/23/2018   OA (osteoarthritis) of knee 07/21/2019   Memory changes 07/12/2020   Right hip pain 07/12/2020   Chronic right-sided low back pain without sciatica 07/12/2020   Numbness and tingling of right side of face 07/12/2020   Osteoporosis 07/12/2020   Pap smear abnormality of cervix/human papillomavirus (HPV) positive 07/12/2020   Groin pain, right 08/03/2020   OAB (overactive bladder) 03/07/2021   Mixed stress and urge urinary incontinence 03/07/2021   Urinary frequency 03/07/2021   Type 2 diabetes mellitus with hyperglycemia, without long-term current use of insulin ([place]) 03/18/2021   Resolved Ambulatory Problems    Diagnosis Date Noted   Acute cystitis 07/17/2009   SLEEP APNEA  10/25/2007   Acute pain of right thigh 08/03/2020   Past Medical History:  Diagnosis Date   Allergy    Arthritis    Blood dyscrasia    CPAP (continuous positive airway pressure) dependence    Diabetes mellitus without complication (HCC)    Fatty liver 4-08   Hip pain    Hypertension    Knee pain    Pneumonia    Recurrent UTI    Sleep apnea        Observations/Objective: No acute distress Normal mood   .Marland Kitchen Today's Vitals   03/18/21 1047  Weight: 217 lb (98.4 kg)  Height: 5\' 7"  (1.702 m)   Body mass index is 33.99 kg/m.    Assessment and Plan: .Marland KitchenKatherine was seen today for follow-up.  Diagnoses and all orders for this visit:  Type 2 diabetes mellitus with hyperglycemia, without long-term current use of insulin (HCC) -     Semaglutide,0.25 or 0.5MG /DOS, (OZEMPIC, 0.25 OR 0.5 MG/DOSE,) 2 MG/1.5ML SOPN; Inject 0.5 mg into the skin once a week.  Essential hypertension   Discussed new dx of T2DM.  Start ozempic. Discussed side effects and titration up to.5mg  over 4 weeks.  Discussed DM diabetic On statin. LDL to goal.  BP to goal of under 130/80.  Discussed eye exam. Make appt.  Will get foot exam.  Covid vaccine UTD.  Shingles UTD.  Pneumonia and Flu UTD.  Follow up in 3 months      Follow Up Instructions:    I discussed the assessment and treatment plan with the patient. The patient was provided an opportunity  to ask questions and all were answered. The patient agreed with the plan and demonstrated an understanding of the instructions.   The patient was advised to call back or seek an in-person evaluation if the symptoms worsen or if the condition fails to improve as anticipated.  I provided 10 minutes of non-face-to-face time during this encounter.   Iran Planas, PA-C

## 2021-03-29 ENCOUNTER — Other Ambulatory Visit (HOSPITAL_COMMUNITY): Payer: Self-pay

## 2021-04-03 ENCOUNTER — Other Ambulatory Visit (HOSPITAL_COMMUNITY): Payer: Self-pay

## 2021-04-04 ENCOUNTER — Other Ambulatory Visit (HOSPITAL_COMMUNITY): Payer: Self-pay

## 2021-04-10 ENCOUNTER — Other Ambulatory Visit (HOSPITAL_COMMUNITY): Payer: Self-pay

## 2021-04-11 ENCOUNTER — Other Ambulatory Visit (HOSPITAL_COMMUNITY): Payer: Self-pay

## 2021-04-18 ENCOUNTER — Other Ambulatory Visit (HOSPITAL_COMMUNITY): Payer: Self-pay

## 2021-04-18 ENCOUNTER — Other Ambulatory Visit: Payer: Self-pay | Admitting: Physician Assistant

## 2021-04-18 MED ORDER — OZEMPIC (0.25 OR 0.5 MG/DOSE) 2 MG/3ML ~~LOC~~ SOPN
PEN_INJECTOR | SUBCUTANEOUS | 1 refills | Status: DC
Start: 2021-03-18 — End: 2021-07-08
  Filled 2021-04-18: qty 3, 28d supply, fill #0
  Filled 2021-05-26: qty 3, 28d supply, fill #1

## 2021-04-20 ENCOUNTER — Other Ambulatory Visit (HOSPITAL_COMMUNITY): Payer: Self-pay

## 2021-04-25 ENCOUNTER — Encounter: Payer: Self-pay | Admitting: Physician Assistant

## 2021-04-25 ENCOUNTER — Other Ambulatory Visit (HOSPITAL_COMMUNITY): Payer: Self-pay

## 2021-04-25 MED ORDER — METHOCARBAMOL 500 MG PO TABS
ORAL_TABLET | ORAL | 1 refills | Status: DC
  Filled 2021-04-25: qty 120, 30d supply, fill #0
  Filled 2022-01-08: qty 120, 30d supply, fill #1

## 2021-05-01 ENCOUNTER — Other Ambulatory Visit (HOSPITAL_COMMUNITY): Payer: Self-pay

## 2021-05-02 ENCOUNTER — Other Ambulatory Visit (HOSPITAL_COMMUNITY): Payer: Self-pay

## 2021-05-14 ENCOUNTER — Emergency Department (HOSPITAL_BASED_OUTPATIENT_CLINIC_OR_DEPARTMENT_OTHER)
Admission: EM | Admit: 2021-05-14 | Discharge: 2021-05-14 | Disposition: A | Discharge status: Home / Self Care | Payer: Medicare Other | Attending: Emergency Medicine | Admitting: Emergency Medicine

## 2021-05-14 ENCOUNTER — Emergency Department (HOSPITAL_BASED_OUTPATIENT_CLINIC_OR_DEPARTMENT_OTHER): Payer: Medicare Other

## 2021-05-14 ENCOUNTER — Other Ambulatory Visit: Payer: Self-pay

## 2021-05-14 ENCOUNTER — Encounter (HOSPITAL_BASED_OUTPATIENT_CLINIC_OR_DEPARTMENT_OTHER): Payer: Self-pay | Admitting: *Deleted

## 2021-05-14 DIAGNOSIS — Y92810 Car as the place of occurrence of the external cause: Secondary | ICD-10-CM | POA: Insufficient documentation

## 2021-05-14 DIAGNOSIS — Z794 Long term (current) use of insulin: Secondary | ICD-10-CM | POA: Insufficient documentation

## 2021-05-14 DIAGNOSIS — S0101XA Laceration without foreign body of scalp, initial encounter: Secondary | ICD-10-CM | POA: Diagnosis not present

## 2021-05-14 DIAGNOSIS — S0990XA Unspecified injury of head, initial encounter: Secondary | ICD-10-CM | POA: Diagnosis present

## 2021-05-14 DIAGNOSIS — W228XXA Striking against or struck by other objects, initial encounter: Secondary | ICD-10-CM | POA: Diagnosis not present

## 2021-05-14 MED ORDER — LIDOCAINE-EPINEPHRINE 2 %-1:200000 IJ SOLN
10.0000 mL | Freq: Once | INTRAMUSCULAR | Status: AC
Start: 2021-05-14 — End: 2021-05-14
  Administered 2021-05-14: 10 mL
  Filled 2021-05-14: qty 20
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
## 2021-05-14 NOTE — Discharge Instructions (Signed)
Return here or PCP for staple removal in 7-10 days. Keep area dry. You can clean with soap and water. Do not scrub area. Please return for any signs of infection or continued drainage.  ? ?
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
## 2021-05-14 NOTE — ED Notes (Signed)
Cleaned dried blood out of hair. 2.5" curved laceration noted to forehead just beneath hairline. Last tetanus vaccine was 3 years ago. ?

## 2021-05-16 ENCOUNTER — Telehealth: Payer: Self-pay | Admitting: General Practice

## 2021-05-16 NOTE — Telephone Encounter (Signed)
Transition Care Management Unsuccessful Follow-up Telephone Call ? ?Date of discharge and from where:  05/14/21 from [facility] med center ? ?Attempts:  1st Attempt ? ?Reason for unsuccessful TCM follow-up call:  Unable to reach patient ? ?  ?

## 2021-05-18 NOTE — Telephone Encounter (Signed)
Transition Care Management Unsuccessful Follow-up Telephone Call ? ?Date of discharge and from where:  05/14/21 from [facility] ? ?Attempts:  2nd Attempt ? ?Reason for unsuccessful TCM follow-up call:  Unable to reach patient ? ?  ?

## 2021-05-21 ENCOUNTER — Other Ambulatory Visit (HOSPITAL_COMMUNITY): Payer: Self-pay

## 2021-05-23 ENCOUNTER — Emergency Department (HOSPITAL_BASED_OUTPATIENT_CLINIC_OR_DEPARTMENT_OTHER)
Admission: EM | Admit: 2021-05-23 | Discharge: 2021-05-23 | Disposition: A | Discharge status: Home / Self Care | Payer: Medicare Other | Attending: Emergency Medicine | Admitting: Emergency Medicine

## 2021-05-23 ENCOUNTER — Other Ambulatory Visit: Payer: Self-pay

## 2021-05-23 ENCOUNTER — Encounter (HOSPITAL_BASED_OUTPATIENT_CLINIC_OR_DEPARTMENT_OTHER): Payer: Self-pay

## 2021-05-23 DIAGNOSIS — Z4802 Encounter for removal of sutures: Secondary | ICD-10-CM | POA: Diagnosis present

## 2021-05-23 NOTE — Discharge Instructions (Signed)
He had 7 staples removed today.  Your wound is without any signs of infection today.  You do have a scab in place.  Please allow this to fall off on its own please.  I have attached instructions regarding care of the area after removal.  If you have any concerning symptoms please return to the emergency room for evaluation. ?
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
## 2021-05-23 NOTE — ED Triage Notes (Addendum)
Here for staple removal of head. 7 staples from 9 days ago ?

## 2021-05-23 NOTE — Telephone Encounter (Signed)
Transition Care Management Unsuccessful Follow-up Telephone Call ? ?Date of discharge and from where:  05/14/21 from [facility] ? ?Attempts:  3rd Attempt ? ?Reason for unsuccessful TCM follow-up call:  Unable to reach patient ? ?  ?

## 2021-05-25 ENCOUNTER — Other Ambulatory Visit (HOSPITAL_COMMUNITY): Payer: Self-pay

## 2021-05-25 ENCOUNTER — Telehealth: Payer: Self-pay | Admitting: General Practice

## 2021-05-25 DIAGNOSIS — M810 Age-related osteoporosis without current pathological fracture: Secondary | ICD-10-CM

## 2021-05-25 MED ORDER — ALENDRONATE SODIUM 70 MG PO TABS
70.0000 mg | ORAL_TABLET | ORAL | 1 refills | Status: DC
  Filled 2021-05-25 – 2021-06-25 (×2): qty 12, 84d supply, fill #0
  Filled 2021-09-12: qty 12, 84d supply, fill #1

## 2021-05-25 NOTE — Telephone Encounter (Signed)
Transition Care Management Follow-up Telephone Call ?Date of discharge and from where: 05/23/21 from [facility] ?How have you been since you were released from the hospital? Patient doing better.  ?Any questions or concerns? No ? ?Items Reviewed: ?Did the pt receive and understand the discharge instructions provided? Yes  ?Medications obtained and verified? Yes  ?Other? No  ?Any new allergies since your discharge? No  ?Dietary orders reviewed? Yes ?Do you have support at home? Yes  ? ?Home Care and Equipment/Supplies: ?Were home health services ordered? no ? ?Functional Questionnaire: (I = Independent and D = Dependent) ?ADLs: I ? ?Bathing/Dressing- I ? ?Meal Prep- I ? ?Eating- I ? ?Maintaining continence- I ? ?Transferring/Ambulation- I ? ?Managing Meds- I ? ?Follow up appointments reviewed: ? ?PCP Hospital f/u appt confirmed? No   ?Specialist Hospital f/u appt confirmed? No   ?Are transportation arrangements needed? No  ?If their condition worsens, is the pt aware to call PCP or go to the Emergency Dept.? Yes ?Was the patient provided with contact information for the PCP's office or ED? Yes ?Was to pt encouraged to call back with questions or concerns? Yes  ?

## 2021-05-25 NOTE — Telephone Encounter (Signed)
90 day supply sent to pharmacy

## 2021-05-25 NOTE — Telephone Encounter (Signed)
Patient requested if her fosamax can be a 90 day supply.  ?

## 2021-05-26 ENCOUNTER — Other Ambulatory Visit (HOSPITAL_COMMUNITY): Payer: Self-pay

## 2021-05-27 ENCOUNTER — Other Ambulatory Visit (HOSPITAL_COMMUNITY): Payer: Self-pay

## 2021-06-22 LAB — HM DIABETES EYE EXAM

## 2021-06-25 ENCOUNTER — Other Ambulatory Visit (HOSPITAL_COMMUNITY): Payer: Self-pay

## 2021-06-28 ENCOUNTER — Other Ambulatory Visit (HOSPITAL_COMMUNITY): Payer: Self-pay

## 2021-06-29 ENCOUNTER — Encounter: Payer: Self-pay | Admitting: Neurology

## 2021-07-08 ENCOUNTER — Other Ambulatory Visit (HOSPITAL_COMMUNITY): Payer: Self-pay

## 2021-07-08 ENCOUNTER — Ambulatory Visit (INDEPENDENT_AMBULATORY_CARE_PROVIDER_SITE_OTHER): Payer: Medicare Other | Admitting: Physician Assistant

## 2021-07-08 VITALS — BP 107/60 | HR 76 | Ht 68.0 in | Wt 202.0 lb

## 2021-07-08 DIAGNOSIS — E7800 Pure hypercholesterolemia, unspecified: Secondary | ICD-10-CM | POA: Diagnosis not present

## 2021-07-08 DIAGNOSIS — I10 Essential (primary) hypertension: Secondary | ICD-10-CM

## 2021-07-08 DIAGNOSIS — N3281 Overactive bladder: Secondary | ICD-10-CM | POA: Diagnosis not present

## 2021-07-08 DIAGNOSIS — E6609 Other obesity due to excess calories: Secondary | ICD-10-CM

## 2021-07-08 DIAGNOSIS — E1165 Type 2 diabetes mellitus with hyperglycemia: Secondary | ICD-10-CM

## 2021-07-08 DIAGNOSIS — E785 Hyperlipidemia, unspecified: Secondary | ICD-10-CM

## 2021-07-08 DIAGNOSIS — Z6830 Body mass index (BMI) 30.0-30.9, adult: Secondary | ICD-10-CM

## 2021-07-08 LAB — POCT GLYCOSYLATED HEMOGLOBIN (HGB A1C): Hemoglobin A1C: 6.4 % — AB (ref 4.0–5.6)

## 2021-07-08 MED ORDER — ACCU-CHEK SOFTCLIX LANCETS MISC
0 refills | Status: DC
  Filled 2021-07-08: qty 100, 25d supply, fill #0

## 2021-07-08 MED ORDER — OZEMPIC (0.25 OR 0.5 MG/DOSE) 2 MG/3ML ~~LOC~~ SOPN
PEN_INJECTOR | SUBCUTANEOUS | 0 refills | Status: DC
  Filled 2021-07-08: qty 3, 28d supply, fill #0
  Filled 2021-08-03: qty 3, 28d supply, fill #1

## 2021-07-08 MED ORDER — ATORVASTATIN CALCIUM 40 MG PO TABS
40.0000 mg | ORAL_TABLET | Freq: Every day | ORAL | 3 refills | Status: DC
  Filled 2021-07-08: qty 90, 90d supply, fill #0
  Filled 2021-10-10: qty 90, 90d supply, fill #1
  Filled 2022-01-08: qty 90, 90d supply, fill #2
  Filled 2022-04-06: qty 90, 90d supply, fill #3

## 2021-07-08 MED ORDER — LOSARTAN POTASSIUM 25 MG PO TABS
25.0000 mg | ORAL_TABLET | Freq: Every day | ORAL | 0 refills | Status: DC
  Filled 2021-07-08: qty 90, 90d supply, fill #0

## 2021-07-08 MED ORDER — BLOOD GLUCOSE METER KIT
PACK | 0 refills | Status: DC
  Filled 2021-07-08: qty 1, 30d supply, fill #0

## 2021-07-08 MED ORDER — SOLIFENACIN SUCCINATE 5 MG PO TABS
5.0000 mg | ORAL_TABLET | Freq: Every day | ORAL | 0 refills | Status: DC
  Filled 2021-07-08: qty 90, 90d supply, fill #0

## 2021-07-08 MED ORDER — ACCU-CHEK GUIDE VI STRP
ORAL_STRIP | 0 refills | Status: DC
  Filled 2021-07-08: qty 100, 25d supply, fill #0

## 2021-07-08 NOTE — Progress Notes (Signed)
Established Patient Office Visit  Subjective   Patient ID: Amanda [name], female    DOB: 01-30-1956  Age: 66 y.o. MRN: [id number]  Chief Complaint  Patient presents with   Diabetes    HPI Patient is a 66 year old obese female with type 2 diabetes, hypertension, dyslipidemia who presents to the clinic for her 66-monthfollow-up.  Patient is not checking her sugars.  She does request a glucometer today.  She is working hard on her diet and exercise.  She has lost 8 pounds in the last 3 months.  She is tolerating Ozempic very well.  She denies any hypoglycemic events.  She denies any open sores or wounds.  She does not have any chest pain, palpitations, headache or vision changes.  She continues to have overactive bladder symptoms and leaking.  She denies any pain with urination.  She has failed Myrbetriq in the past.  She requests a urology referral.   Patient Active Problem List   Diagnosis Date Noted   Class 1 obesity due to excess calories with serious comorbidity and body mass index (BMI) of 30.0 to 30.9 in adult 07/13/2021   Dyslipidemia, goal LDL below 70 07/13/2021   Type 2 diabetes mellitus with hyperglycemia, without long-term current use of insulin ([facility] 03/18/2021   OAB (overactive bladder) 03/07/2021   Mixed stress and urge urinary incontinence 03/07/2021   Urinary frequency 03/07/2021   Groin pain, right 08/03/2020   Memory changes 07/12/2020   Right hip pain 07/12/2020   Chronic right-sided low back pain without sciatica 07/12/2020   Numbness and tingling of right side of face 07/12/2020   Osteoporosis 07/12/2020   Pap smear abnormality of cervix/human papillomavirus (HPV) positive 07/12/2020   OA (osteoarthritis) of knee 07/21/2019   Hammer toe of left foot 07/23/2018   Epigastric pain 07/23/2018   Onychomycosis 07/23/2018   Bilateral primary osteoarthritis of knee 03/05/2018   Gout 03/05/2018   Chronic pain of both knees 03/05/2018   Status post laparoscopic  sleeve gastrectomy 03/04/2018   Obesity 05/23/2010   LEG CRAMPS 07/17/2009   Constipation 06/21/2009   FATTY LIVER DISEASE 09/12/2007   Essential hypertension 05/16/2007   ARTHRITIS 05/16/2007   DIVERTICULOSIS, COLON 10/18/2006   HYPERCHOLESTEROLEMIA 09/05/2006   Factor V deficiency ([facility] [place] 09/05/2006   Past Medical History:  Diagnosis Date   Allergy    seasonal   Arthritis    all over   Blood dyscrasia    Carrys trait for Leiden Factor five never had any issues . Yhe only reason she was tested was because her mother had it   CPAP (continuous positive airway pressure) dependence    8 cm water w/ heated humidifier   Diabetes mellitus without complication ([facility]    Pre surgery - Gastric sx 2014 R[facility]-Y no diabetes since  no meds   Fatty liver 4-08   by [place]  Gout    Hip pain    Hypertension    Knee pain    Obesity    Pneumonia    Recurrent UTI    Sleep apnea    had roux en y lost 140lbs no longer needs cpap   Family History  Problem Relation Age of Onset   Breast cancer Mother    Diabetes Mother    Hypertension Mother    Cancer Mother        breast- partial mastectomy- in her 841's  Heart attack Father    Hypertension Father    Arthritis Father  Gout Father    Parkinsonism Father    Parkinson's disease Father    Alcohol abuse Maternal Grandfather    Colon cancer Neg Hx    Esophageal cancer Neg Hx    Stomach cancer Neg Hx    Rectal cancer Neg Hx    Allergies  Allergen Reactions   Fenofibrate Other (See Comments)    Constipation   Morphine Nausea And Vomiting   Penicillins     Unsure of reaction   Sulfonamide Derivatives     Unsure of reaction      ROS    Objective:     BP 107/60   Pulse 76   Ht '5\' 8"'  (1.727 m)   Wt 202 lb (91.6 kg)   SpO2 100%   BMI 30.71 kg/m  BP Readings from Last 3 Encounters:  07/08/21 107/60  05/23/21 113/81  05/14/21 132/81   Wt Readings from Last 3 Encounters:  07/08/21 202 lb (91.6 kg)  05/23/21 210 lb (95.3 kg)   05/14/21 210 lb (95.3 kg)      Physical Exam Vitals reviewed.  Constitutional:      Appearance: Normal appearance. She is obese.  HENT:     Head: Normocephalic.  Neck:     Vascular: No carotid bruit.  Cardiovascular:     Rate and Rhythm: Normal rate and regular rhythm.     Pulses: Normal pulses.     Heart sounds: Normal heart sounds.  Pulmonary:     Effort: Pulmonary effort is normal.     Breath sounds: Normal breath sounds.  Musculoskeletal:     Right lower leg: No edema.     Left lower leg: No edema.  Neurological:     General: No focal deficit present.     Mental Status: She is alert and oriented to person, place, and time.  Psychiatric:        Mood and Affect: Mood normal.     Results for orders placed or performed in visit on 07/08/21  POCT glycosylated hemoglobin (Hb A1C)  Result Value Ref Range   Hemoglobin A1C 6.4 (A) 4.0 - 5.6 %   HbA1c POC (<> result, manual entry)     HbA1c, POC (prediabetic range)     HbA1c, POC (controlled diabetic range)          Assessment & Plan:  .Marland KitchenAzaylia was seen today for diabetes.  Diagnoses and all orders for this visit:  Type 2 diabetes mellitus with hyperglycemia, without long-term current use of insulin (HCC) -     POCT glycosylated hemoglobin (Hb A1C) -     blood glucose meter kit and supplies; Use up to four times daily as directed -     Semaglutide,0.25 or 0.5MG/DOS, (OZEMPIC, 0.25 OR 0.5 MG/DOSE,) 2 MG/3ML SOPN; Inject 0.7m into the skin once a week  Essential hypertension -     losartan (COZAAR) 25 MG tablet; Take 1 tablet (25 mg total) by mouth daily.  OAB (overactive bladder) -     solifenacin (VESICARE) 5 MG tablet; Take 1 tablet (5 mg total) by mouth daily. For overactive bladder. -     Ambulatory referral to Urology  HYPERCHOLESTEROLEMIA -     atorvastatin (LIPITOR) 40 MG tablet; Take 1 tablet (40 mg total) by mouth daily.  Dyslipidemia, goal LDL below 70 -     atorvastatin (LIPITOR) 40 MG tablet;  Take 1 tablet (40 mg total) by mouth daily.  Class 1 obesity due to excess calories with serious comorbidity and body  mass index (BMI) of 30.0 to 30.9 in adult   A1C to goal.  Ozempic refilled Stay on same medications BP to goal, on ARB On statin Foot and eye exam UTD Covid vaccine x2. Shingles/pneumonia/flu UTD Follow up in 3 months.   Discussed OAB Declined pelvic floor PT Failed myrbetriq Trial of vesicare Request urology referral  Continue to work on weight loss BMI under 27 is the goal     Return in about 3 months (around 10/08/2021).    Iran Planas, PA-C

## 2021-07-11 ENCOUNTER — Other Ambulatory Visit (HOSPITAL_COMMUNITY): Payer: Self-pay

## 2021-07-13 ENCOUNTER — Encounter: Payer: Self-pay | Admitting: Physician Assistant

## 2021-07-13 DIAGNOSIS — E6609 Other obesity due to excess calories: Secondary | ICD-10-CM | POA: Insufficient documentation

## 2021-07-13 DIAGNOSIS — E785 Hyperlipidemia, unspecified: Secondary | ICD-10-CM | POA: Insufficient documentation

## 2021-07-18 ENCOUNTER — Other Ambulatory Visit (HOSPITAL_COMMUNITY): Payer: Self-pay

## 2021-07-29 ENCOUNTER — Other Ambulatory Visit (HOSPITAL_COMMUNITY): Payer: Self-pay

## 2021-08-04 ENCOUNTER — Other Ambulatory Visit (HOSPITAL_COMMUNITY): Payer: Self-pay

## 2021-08-08 ENCOUNTER — Other Ambulatory Visit: Payer: Self-pay | Admitting: Physician Assistant

## 2021-08-08 ENCOUNTER — Other Ambulatory Visit (HOSPITAL_COMMUNITY): Payer: Self-pay

## 2021-08-08 DIAGNOSIS — I10 Essential (primary) hypertension: Secondary | ICD-10-CM

## 2021-08-08 DIAGNOSIS — N3281 Overactive bladder: Secondary | ICD-10-CM

## 2021-08-08 MED ORDER — LOSARTAN POTASSIUM 25 MG PO TABS
25.0000 mg | ORAL_TABLET | Freq: Every day | ORAL | 0 refills | Status: DC
  Filled 2021-08-08 – 2021-10-23 (×2): qty 90, 90d supply, fill #0

## 2021-08-08 MED ORDER — SPIRONOLACTONE 25 MG PO TABS
ORAL_TABLET | Freq: Every day | ORAL | 0 refills | Status: DC
  Filled 2021-08-08: qty 90, 90d supply, fill #0

## 2021-08-08 MED ORDER — SOLIFENACIN SUCCINATE 5 MG PO TABS
5.0000 mg | ORAL_TABLET | Freq: Every day | ORAL | 0 refills | Status: DC
  Filled 2021-08-08: qty 90, 90d supply, fill #0

## 2021-08-12 ENCOUNTER — Other Ambulatory Visit (HOSPITAL_COMMUNITY): Payer: Self-pay

## 2021-09-12 ENCOUNTER — Other Ambulatory Visit (HOSPITAL_COMMUNITY): Payer: Self-pay

## 2021-10-03 ENCOUNTER — Encounter: Payer: Self-pay | Admitting: Physician Assistant

## 2021-10-03 DIAGNOSIS — N3281 Overactive bladder: Secondary | ICD-10-CM

## 2021-10-03 DIAGNOSIS — N3946 Mixed incontinence: Secondary | ICD-10-CM

## 2021-10-10 ENCOUNTER — Other Ambulatory Visit (HOSPITAL_COMMUNITY): Payer: Self-pay

## 2021-10-11 ENCOUNTER — Other Ambulatory Visit (HOSPITAL_COMMUNITY): Payer: Self-pay

## 2021-10-17 ENCOUNTER — Other Ambulatory Visit (HOSPITAL_COMMUNITY): Payer: Self-pay

## 2021-10-23 ENCOUNTER — Other Ambulatory Visit: Payer: Self-pay | Admitting: Physician Assistant

## 2021-10-23 ENCOUNTER — Other Ambulatory Visit (HOSPITAL_COMMUNITY): Payer: Self-pay

## 2021-10-23 DIAGNOSIS — I10 Essential (primary) hypertension: Secondary | ICD-10-CM

## 2021-10-24 ENCOUNTER — Other Ambulatory Visit (HOSPITAL_COMMUNITY): Payer: Self-pay

## 2021-10-24 MED ORDER — SPIRONOLACTONE 25 MG PO TABS
ORAL_TABLET | Freq: Every day | ORAL | 0 refills | Status: DC
  Filled 2021-10-24: qty 90, 90d supply, fill #0

## 2021-12-04 ENCOUNTER — Other Ambulatory Visit: Payer: Self-pay | Admitting: Physician Assistant

## 2021-12-04 DIAGNOSIS — M810 Age-related osteoporosis without current pathological fracture: Secondary | ICD-10-CM

## 2021-12-05 ENCOUNTER — Other Ambulatory Visit (HOSPITAL_COMMUNITY): Payer: Self-pay

## 2021-12-05 MED ORDER — ALENDRONATE SODIUM 70 MG PO TABS
70.0000 mg | ORAL_TABLET | ORAL | 0 refills | Status: DC
  Filled 2021-12-05: qty 12, 84d supply, fill #0

## 2021-12-06 ENCOUNTER — Other Ambulatory Visit (HOSPITAL_COMMUNITY): Payer: Self-pay

## 2022-01-08 ENCOUNTER — Other Ambulatory Visit (HOSPITAL_COMMUNITY): Payer: Self-pay

## 2022-01-09 ENCOUNTER — Other Ambulatory Visit (HOSPITAL_COMMUNITY): Payer: Self-pay

## 2022-01-11 ENCOUNTER — Other Ambulatory Visit (HOSPITAL_COMMUNITY): Payer: Self-pay

## 2022-01-11 ENCOUNTER — Encounter (HOSPITAL_COMMUNITY): Payer: Self-pay

## 2022-01-16 ENCOUNTER — Other Ambulatory Visit (HOSPITAL_COMMUNITY): Payer: Self-pay

## 2022-01-16 ENCOUNTER — Other Ambulatory Visit: Payer: Self-pay | Admitting: Physician Assistant

## 2022-01-16 DIAGNOSIS — M1A4720 Other secondary chronic gout, left ankle and foot, without tophus (tophi): Secondary | ICD-10-CM

## 2022-01-16 MED ORDER — ALLOPURINOL 100 MG PO TABS
100.0000 mg | ORAL_TABLET | Freq: Every day | ORAL | 0 refills | Status: DC
  Filled 2022-01-16: qty 90, 90d supply, fill #0

## 2022-01-21 ENCOUNTER — Other Ambulatory Visit (HOSPITAL_COMMUNITY): Payer: Self-pay

## 2022-01-30 ENCOUNTER — Other Ambulatory Visit: Payer: Self-pay | Admitting: Physician Assistant

## 2022-01-30 DIAGNOSIS — I10 Essential (primary) hypertension: Secondary | ICD-10-CM

## 2022-01-31 ENCOUNTER — Other Ambulatory Visit (HOSPITAL_COMMUNITY): Payer: Self-pay

## 2022-01-31 ENCOUNTER — Other Ambulatory Visit: Payer: Self-pay

## 2022-01-31 MED ORDER — LOSARTAN POTASSIUM 25 MG PO TABS
25.0000 mg | ORAL_TABLET | Freq: Every day | ORAL | 0 refills | Status: DC
  Filled 2022-01-31: qty 30, 30d supply, fill #0

## 2022-02-13 ENCOUNTER — Ambulatory Visit (INDEPENDENT_AMBULATORY_CARE_PROVIDER_SITE_OTHER): Payer: PPO | Admitting: Physician Assistant

## 2022-02-13 VITALS — BP 120/95 | HR 63 | Ht 68.0 in | Wt 208.0 lb

## 2022-02-13 DIAGNOSIS — E1165 Type 2 diabetes mellitus with hyperglycemia: Secondary | ICD-10-CM

## 2022-02-13 DIAGNOSIS — Z780 Asymptomatic menopausal state: Secondary | ICD-10-CM

## 2022-02-13 DIAGNOSIS — E785 Hyperlipidemia, unspecified: Secondary | ICD-10-CM

## 2022-02-13 DIAGNOSIS — Z0000 Encounter for general adult medical examination without abnormal findings: Secondary | ICD-10-CM

## 2022-02-13 DIAGNOSIS — N3942 Incontinence without sensory awareness: Secondary | ICD-10-CM

## 2022-02-13 DIAGNOSIS — M810 Age-related osteoporosis without current pathological fracture: Secondary | ICD-10-CM

## 2022-02-13 DIAGNOSIS — I10 Essential (primary) hypertension: Secondary | ICD-10-CM

## 2022-02-13 DIAGNOSIS — Z1329 Encounter for screening for other suspected endocrine disorder: Secondary | ICD-10-CM

## 2022-02-13 DIAGNOSIS — E7800 Pure hypercholesterolemia, unspecified: Secondary | ICD-10-CM | POA: Diagnosis not present

## 2022-02-13 DIAGNOSIS — R413 Other amnesia: Secondary | ICD-10-CM

## 2022-02-13 DIAGNOSIS — M1A4720 Other secondary chronic gout, left ankle and foot, without tophus (tophi): Secondary | ICD-10-CM

## 2022-02-13 NOTE — Patient Instructions (Signed)

## 2022-02-13 NOTE — Progress Notes (Deleted)
..      03/07/2021    8:57 AM  6CIT Screen  What Year? 0 points  What month? 0 points  What time? 0 points  Count back from 20 0 points  Months in reverse 0 points  Repeat phrase 0 points  Total Score 0 points

## 2022-02-14 ENCOUNTER — Other Ambulatory Visit: Payer: Self-pay | Admitting: Physician Assistant

## 2022-02-14 ENCOUNTER — Other Ambulatory Visit (HOSPITAL_COMMUNITY): Payer: Self-pay

## 2022-02-14 DIAGNOSIS — I10 Essential (primary) hypertension: Secondary | ICD-10-CM

## 2022-02-14 LAB — CBC
HCT: 38.7 % (ref 35.0–45.0)
Hemoglobin: 13.3 g/dL (ref 11.7–15.5)
MCH: 32.5 pg (ref 27.0–33.0)
MCHC: 34.4 g/dL (ref 32.0–36.0)
MCV: 94.6 fL (ref 80.0–100.0)
MPV: 10.5 fL (ref 7.5–12.5)
Platelets: 178 10*3/uL (ref 140–400)
RBC: 4.09 10*6/uL (ref 3.80–5.10)
RDW: 12.9 % (ref 11.0–15.0)
WBC: 8.5 10*3/uL (ref 3.8–10.8)

## 2022-02-14 LAB — COMPLETE METABOLIC PANEL WITH GFR
AG Ratio: 1.4 (calc) (ref 1.0–2.5)
ALT: 18 U/L (ref 6–29)
AST: 20 U/L (ref 10–35)
Albumin: 4.1 g/dL (ref 3.6–5.1)
Alkaline phosphatase (APISO): 62 U/L (ref 37–153)
BUN: 17 mg/dL (ref 7–25)
CO2: 28 mmol/L (ref 20–32)
Calcium: 10.6 mg/dL — ABNORMAL HIGH (ref 8.6–10.4)
Chloride: 110 mmol/L (ref 98–110)
Creat: 0.93 mg/dL (ref 0.50–1.05)
Globulin: 2.9 g/dL (calc) (ref 1.9–3.7)
Glucose, Bld: 155 mg/dL — ABNORMAL HIGH (ref 65–99)
Potassium: 4.8 mmol/L (ref 3.5–5.3)
Sodium: 145 mmol/L (ref 135–146)
Total Bilirubin: 0.6 mg/dL (ref 0.2–1.2)
Total Protein: 7 g/dL (ref 6.1–8.1)
eGFR: 68 mL/min/{1.73_m2} (ref >=60)

## 2022-02-14 LAB — LIPID PANEL W/REFLEX DIRECT LDL
Cholesterol: 116 mg/dL (ref <200)
HDL: 53 mg/dL (ref >=50)
LDL Cholesterol (Calc): 36 mg/dL (calc)
Non-HDL Cholesterol (Calc): 63 mg/dL (calc) (ref <130)
Total CHOL/HDL Ratio: 2.2 (calc) (ref <5.0)
Triglycerides: 196 mg/dL — ABNORMAL HIGH (ref <150)

## 2022-02-14 LAB — FOLATE: Folate: >24 ng/mL

## 2022-02-14 LAB — SEDIMENTATION RATE: Sed Rate: 11 mm/h (ref 0–30)

## 2022-02-14 LAB — RPR: RPR Ser Ql: NONREACTIVE

## 2022-02-14 LAB — HEMOGLOBIN A1C
Hgb A1c MFr Bld: 7.3 % of total Hgb — ABNORMAL HIGH (ref <5.7)
Mean Plasma Glucose: 163 mg/dL
eAG (mmol/L): 9 mmol/L

## 2022-02-14 LAB — HIV ANTIBODY (ROUTINE TESTING W REFLEX): HIV 1&2 Ab, 4th Generation: NONREACTIVE

## 2022-02-14 LAB — VITAMIN D 25 HYDROXY (VIT D DEFICIENCY, FRACTURES): Vit D, 25-Hydroxy: 26 ng/mL — ABNORMAL LOW (ref 30–100)

## 2022-02-14 LAB — VITAMIN B12: Vitamin B-12: 595 pg/mL (ref 200–1100)

## 2022-02-14 LAB — MICROALBUMIN / CREATININE URINE RATIO
Creatinine, Urine: 103 mg/dL (ref 20–275)
Microalb Creat Ratio: 11 mcg/mg creat (ref <30)
Microalb, Ur: 1.1 mg/dL

## 2022-02-14 LAB — TSH: TSH: 1.49 mIU/L (ref 0.40–4.50)

## 2022-02-14 MED ORDER — SPIRONOLACTONE 25 MG PO TABS
25.0000 mg | ORAL_TABLET | Freq: Every day | ORAL | 0 refills | Status: DC
  Filled 2022-02-14: qty 90, 90d supply, fill #0

## 2022-02-14 NOTE — Progress Notes (Signed)
Denice Paradise,   Vitamin D is low make sure taking at least 2000 units daily with dairy.  A1C is up. Darcel Bayley has actually been better tolerated than ozempic. Are you ok with trying that once week injection?  Calcium is a little elevated. Increasing vitamin D could help with that.  TG elevated  but would likely come down when glucose comes down.

## 2022-02-15 ENCOUNTER — Other Ambulatory Visit: Payer: Self-pay

## 2022-02-15 ENCOUNTER — Encounter: Payer: Self-pay | Admitting: Physician Assistant

## 2022-02-15 ENCOUNTER — Other Ambulatory Visit (HOSPITAL_COMMUNITY): Payer: Self-pay

## 2022-02-15 MED ORDER — MOUNJARO 2.5 MG/0.5ML ~~LOC~~ SOAJ
2.5000 mg | SUBCUTANEOUS | 2 refills | Status: DC
  Filled 2022-02-15 – 2022-03-04 (×2): qty 2, 28d supply, fill #0
  Filled 2022-03-29: qty 2, 28d supply, fill #1
  Filled 2022-04-23: qty 2, 28d supply, fill #2

## 2022-02-17 ENCOUNTER — Other Ambulatory Visit: Payer: Self-pay

## 2022-02-17 ENCOUNTER — Other Ambulatory Visit (HOSPITAL_COMMUNITY): Payer: Self-pay

## 2022-02-17 ENCOUNTER — Other Ambulatory Visit: Payer: Self-pay | Admitting: Physician Assistant

## 2022-02-17 DIAGNOSIS — Z780 Asymptomatic menopausal state: Secondary | ICD-10-CM | POA: Insufficient documentation

## 2022-02-17 MED ORDER — ALENDRONATE SODIUM 70 MG PO TABS
70.0000 mg | ORAL_TABLET | ORAL | 1 refills | Status: DC
  Filled 2022-02-17: qty 12, 84d supply, fill #0
  Filled 2022-06-06: qty 12, 84d supply, fill #1

## 2022-02-17 MED ORDER — ALLOPURINOL 100 MG PO TABS
100.0000 mg | ORAL_TABLET | Freq: Every day | ORAL | 3 refills | Status: DC
  Filled 2022-02-17 – 2022-04-14 (×2): qty 90, 90d supply, fill #0
  Filled 2022-07-23: qty 90, 90d supply, fill #1
  Filled 2022-11-05: qty 90, 90d supply, fill #2
  Filled 2023-02-03: qty 90, 90d supply, fill #3

## 2022-02-17 MED ORDER — OMEPRAZOLE 20 MG PO CPDR
20.0000 mg | DELAYED_RELEASE_CAPSULE | Freq: Every day | ORAL | 3 refills | Status: DC
  Filled 2022-02-17: qty 90, 90d supply, fill #0
  Filled 2022-07-03 – 2022-07-23 (×2): qty 90, 90d supply, fill #1
  Filled 2022-10-22: qty 90, 90d supply, fill #2
  Filled 2023-01-22: qty 90, 90d supply, fill #3

## 2022-02-17 MED ORDER — LOSARTAN POTASSIUM 25 MG PO TABS
25.0000 mg | ORAL_TABLET | Freq: Every day | ORAL | 1 refills | Status: DC
  Filled 2022-02-17 – 2022-03-05 (×2): qty 90, 90d supply, fill #0
  Filled 2022-06-06: qty 90, 90d supply, fill #1

## 2022-02-17 NOTE — Progress Notes (Signed)
Complete physical exam  Patient: Amanda [name]   DOB: 1955-11-12   67 y.o. Female  MRN: [id number]  Subjective:    Chief Complaint  Patient presents with   Annual Exam    Amanda [name] is a 67 y.o. female who presents today for a complete physical exam. She reports consuming a general diet. The patient does not participate in regular exercise at present. She generally feels fairly well. She reports sleeping fairly well. She does  have additional problems to discuss today.   Continues to have some memory issues.     Most recent fall risk assessment:    07/08/2021    9:30 AM  Fall Risk   Falls in the past year? 0  Number falls in past yr: 0  Injury with Fall? 0  Risk for fall due to : No Fall Risks  Follow up Falls evaluation completed     Most recent depression screenings:    02/13/2022   11:08 AM 07/08/2021    9:30 AM  PHQ 2/9 Scores  PHQ - 2 Score 1 2  PHQ- 9 Score 5 3    Vision:Within last year and Dental: No current dental problems and Receives regular dental care  Patient Active Problem List   Diagnosis Date Noted   Post-menopausal 02/17/2022   Urinary incontinence without sensory awareness 02/13/2022   Class 1 obesity due to excess calories with serious comorbidity and body mass index (BMI) of 30.0 to 30.9 in adult 07/13/2021   Dyslipidemia, goal LDL below 70 07/13/2021   Type 2 diabetes mellitus with hyperglycemia, without long-term current use of insulin ([place]) 03/18/2021   OAB (overactive bladder) 03/07/2021   Mixed stress and urge urinary incontinence 03/07/2021   Urinary frequency 03/07/2021   Groin pain, right 08/03/2020   Memory changes 07/12/2020   Right hip pain 07/12/2020   Chronic right-sided low back pain without sciatica 07/12/2020   Numbness and tingling of right side of face 07/12/2020   Osteoporosis 07/12/2020   Pap smear abnormality of cervix/human papillomavirus (HPV) positive 07/12/2020   OA (osteoarthritis) of knee 07/21/2019    Hammer toe of left foot 07/23/2018   Epigastric pain 07/23/2018   Onychomycosis 07/23/2018   Bilateral primary osteoarthritis of knee 03/05/2018   Gout 03/05/2018   Chronic pain of both knees 03/05/2018   Status post laparoscopic sleeve gastrectomy 03/04/2018   Obesity 05/23/2010   LEG CRAMPS 07/17/2009   Constipation 06/21/2009   FATTY LIVER DISEASE 09/12/2007   Essential hypertension 05/16/2007   ARTHRITIS 05/16/2007   DIVERTICULOSIS, COLON 10/18/2006   HYPERCHOLESTEROLEMIA 09/05/2006   Factor V deficiency ([place]) 09/05/2006   Past Medical History:  Diagnosis Date   Allergy    seasonal   Arthritis    all over   Blood dyscrasia    Carrys trait for Leiden Factor five never had any issues . Yhe only reason she was tested was because her mother had it   CPAP (continuous positive airway pressure) dependence    8 cm water w/ heated humidifier   Diabetes mellitus without complication ([place])    Pre surgery - Gastric sx 2014 R[facility]-Y no diabetes since  no meds   Fatty liver 4-08   by [place]   Gout    Hip pain    Hypertension    Knee pain    Obesity    Pneumonia    Recurrent UTI    Sleep apnea    had roux en y lost 140lbs no longer needs  cpap   Past Surgical History:  Procedure Laterality Date   APPENDECTOMY  1977   CESAREAN SECTION  1989   one   [place] Bilateral 1985   ROUX-EN-Y GASTRIC BYPASS  2014   TOTAL KNEE ARTHROPLASTY Right 07/21/2019   Procedure: TOTAL KNEE ARTHROPLASTY;  Surgeon: Gaynelle Arabian, MD;  Location: [facility];  Service: Orthopedics;  Laterality: Right;  86mn   Family History  Problem Relation Age of Onset   Breast cancer Mother    Diabetes Mother    Hypertension Mother    Cancer Mother        breast- partial mastectomy- in her 857's  Heart attack Father    Hypertension Father    Arthritis Father    Gout Father    Parkinsonism Father    Parkinson's disease Father     Alcohol abuse Maternal Grandfather    Colon cancer Neg Hx    Esophageal cancer Neg Hx    Stomach cancer Neg Hx    Rectal cancer Neg Hx    Allergies  Allergen Reactions   Fenofibrate Other (See Comments)    Constipation   Morphine Nausea And Vomiting   Penicillins     Unsure of reaction   Sulfonamide Derivatives     Unsure of reaction      Patient Care Team: BLavada Mesias PCP - General (Family Medicine)   Outpatient Medications Prior to Visit  Medication Sig   Accu-Chek Softclix Lancets lancets Use up to four times daily as directed   atorvastatin (LIPITOR) 40 MG tablet Take 1 tablet (40 mg total) by mouth daily.   blood glucose meter kit and supplies Use up to four times daily as directed   calcium citrate (CALCITRATE - DOSED IN MG ELEMENTAL CALCIUM) 950 (200 Ca) MG tablet    ferrous sulfate 324 MG TBEC Take 324 mg by mouth.   fexofenadine (ALLEGRA) 180 MG tablet 180 mg by oral route.   fluticasone (FLONASE) 50 MCG/ACT nasal spray    gabapentin (NEURONTIN) 100 MG capsule Take 1 to 3 capsules by mouth up to three times a day for pain.   glucose blood (ACCU-CHEK GUIDE) test strip Use up to four times daily as directed   ibuprofen (ADVIL) 800 MG tablet Take 800 mg by mouth every 8 (eight) hours as needed.   methocarbamol (ROBAXIN) 500 MG tablet TAKE 1 TABLET (500 MG TOTAL) BY MOUTH 4 (FOUR) TIMES DAILY.   Multiple Vitamin (MULTI-VITAMIN) tablet Take 1 tablet by mouth daily.   omeprazole (PRILOSEC) 20 MG capsule Take by mouth.   [DISCONTINUED] alendronate (FOSAMAX) 70 MG tablet Take 1 tablet (70 mg total) by mouth every 7 (seven) days.   [DISCONTINUED] allopurinol (ZYLOPRIM) 100 MG tablet Take 1 tablet (100 mg total) by mouth daily. Needs appt   [DISCONTINUED] clarithromycin (BIAXIN) 500 MG tablet    [DISCONTINUED] famotidine (PEPCID) 10 MG tablet Take 10 mg by mouth 2 (two) times daily.   [DISCONTINUED] hydrochlorothiazide (HYDRODIURIL) 12.5 MG tablet Take 1 tablet  by mouth daily.   [DISCONTINUED] ipratropium (ATROVENT) 0.06 % nasal spray    [DISCONTINUED] losartan (COZAAR) 25 MG tablet Take 1 tablet (25 mg total) by mouth daily.   [DISCONTINUED] rivaroxaban (XARELTO) 10 MG TABS tablet Take 1 tablet by mouth daily.   [DISCONTINUED] Semaglutide,0.25 or 0.'5MG'$ /DOS, (OZEMPIC, 0.25 OR 0.5 MG/DOSE,) 2 MG/3ML SOPN Inject 0.'5mg'$  into the skin once  a week   [DISCONTINUED] solifenacin (VESICARE) 5 MG tablet Take 1 tablet (5 mg total) by mouth daily for overactive bladder.   [DISCONTINUED] spironolactone (ALDACTONE) 25 MG tablet TAKE 1 TABLET BY MOUTH ONCE DAILY   No facility-administered medications prior to visit.    Review of Systems  All other systems reviewed and are negative.         Objective:     BP (!) 120/95   Pulse 63   Ht '5\' 8"'$  (1.727 m)   Wt 208 lb (94.3 kg)   SpO2 98%   BMI 31.63 kg/m  BP Readings from Last 3 Encounters:  02/13/22 (!) 120/95  07/08/21 107/60  05/23/21 113/81   Wt Readings from Last 3 Encounters:  02/13/22 208 lb (94.3 kg)  07/08/21 202 lb (91.6 kg)  05/23/21 210 lb (95.3 kg)    .Marland Kitchen    03/07/2021    8:57 AM  6CIT Screen  What Year? 0 points  What month? 0 points  What time? 0 points  Count back from 20 0 points  Months in reverse 0 points  Repeat phrase 0 points  Total Score 0 points      Physical Exam  BP (!) 120/95   Pulse 63   Ht '5\' 8"'$  (1.727 m)   Wt 208 lb (94.3 kg)   SpO2 98%   BMI 31.63 kg/m   General Appearance:    Alert, cooperative, no distress, obese appears stated age  Head:    Normocephalic, without obvious abnormality, atraumatic  Eyes:    PERRL, conjunctiva/corneas clear, EOM's intact, fundi    benign, both eyes  Ears:    Normal TM's and external ear canals, both ears  Nose:   Nares normal, septum midline, mucosa normal, no drainage    or sinus tenderness  Throat:   Lips, mucosa, and tongue normal; teeth and gums normal  Neck:   Supple, symmetrical, trachea midline, no  adenopathy;    thyroid:  no enlargement/tenderness/nodules; no carotid   bruit or JVD  Back:     Symmetric, no curvature, ROM normal, no CVA tenderness  Lungs:     Clear to auscultation bilaterally, respirations unlabored  Chest Wall:    No tenderness or deformity   Heart:    Regular rate and rhythm, S1 and S2 normal, no murmur, rub   or gallop     Abdomen:     Soft, non-tender, bowel sounds active all four quadrants,    no masses, no organomegaly        Extremities:   Extremities normal, atraumatic, no cyanosis or edema  Pulses:   2+ and symmetric all extremities  Skin:   Skin color, texture, turgor normal, no rashes or lesions  Lymph nodes:   Cervical, supraclavicular, and axillary nodes normal  Neurologic:   CNII-XII intact, normal strength, sensation and reflexes    throughout    Results for orders placed or performed in visit on 02/13/22  RPR  Result Value Ref Range   RPR Ser Ql NON-REACTIVE NON-REACTIVE  Vitamin B12  Result Value Ref Range   Vitamin B-12 595 200 - 1,100 pg/mL  Sedimentation rate  Result Value Ref Range   Sed Rate 11 0 - 30 mm/h  CBC  Result Value Ref Range   WBC 8.5 3.8 - 10.8 Thousand/uL   RBC 4.09 3.80 - 5.10 Million/uL   Hemoglobin 13.3 11.7 - 15.5 g/dL   HCT 38.7 35.0 - 45.0 %   MCV 94.6 80.0 -  100.0 fL   MCH 32.5 27.0 - 33.0 pg   MCHC 34.4 32.0 - 36.0 g/dL   RDW 12.9 11.0 - 15.0 %   Platelets 178 140 - 400 Thousand/uL   MPV 10.5 7.5 - 12.5 fL  TSH  Result Value Ref Range   TSH 1.49 0.40 - 4.50 mIU/L  Folate  Result Value Ref Range   Folate >24.0 ng/mL  HIV antibody (with reflex)  Result Value Ref Range   HIV 1&2 Ab, 4th Generation NON-REACTIVE NON-REACTIVE  Microalbumin / creatinine urine ratio  Result Value Ref Range   Creatinine, Urine 103 20 - 275 mg/dL   Microalb, Ur 1.1 mg/dL   Microalb Creat Ratio 11 <30 mcg/mg creat  Lipid Panel w/reflex Direct LDL  Result Value Ref Range   Cholesterol 116 <200 mg/dL   HDL 53 > OR = 50  mg/dL   Triglycerides 196 (H) <150 mg/dL   LDL Cholesterol (Calc) 36 mg/dL (calc)   Total CHOL/HDL Ratio 2.2 <5.0 (calc)   Non-HDL Cholesterol (Calc) 63 <130 mg/dL (calc)  COMPLETE METABOLIC PANEL WITH GFR  Result Value Ref Range   Glucose, Bld 155 (H) 65 - 99 mg/dL   BUN 17 7 - 25 mg/dL   Creat 0.93 0.50 - 1.05 mg/dL   eGFR 68 > OR = 60 mL/min/1.45m   BUN/Creatinine Ratio SEE NOTE: 6 - 22 (calc)   Sodium 145 135 - 146 mmol/L   Potassium 4.8 3.5 - 5.3 mmol/L   Chloride 110 98 - 110 mmol/L   CO2 28 20 - 32 mmol/L   Calcium 10.6 (H) 8.6 - 10.4 mg/dL   Total Protein 7.0 6.1 - 8.1 g/dL   Albumin 4.1 3.6 - 5.1 g/dL   Globulin 2.9 1.9 - 3.7 g/dL (calc)   AG Ratio 1.4 1.0 - 2.5 (calc)   Total Bilirubin 0.6 0.2 - 1.2 mg/dL   Alkaline phosphatase (APISO) 62 37 - 153 U/L   AST 20 10 - 35 U/L   ALT 18 6 - 29 U/L  Hemoglobin A1c  Result Value Ref Range   Hgb A1c MFr Bld 7.3 (H) <5.7 % of total Hgb   Mean Plasma Glucose 163 mg/dL   eAG (mmol/L) 9.0 mmol/L  VITAMIN D 25 Hydroxy (Vit-D Deficiency, Fractures)  Result Value Ref Range   Vit D, 25-Hydroxy 26 (L) 30 - 100 ng/mL       Assessment & Plan:    Routine Health Maintenance and Physical Exam  Immunization History  Administered Date(s) Administered   Influenza Split 11/13/2011, 12/06/2012   Influenza Whole 11/06/2005, 11/16/2008   Influenza, Seasonal, Injecte, Preservative Fre 11/15/2015, 11/16/2016   Influenza,inj,quad, With Preservative 11/06/2017, 11/07/2018   Influenza-Unspecified 11/17/2013, 11/15/2015, 11/23/2016, 11/25/2018, 11/07/2019, 12/06/2020   PFIZER(Purple Top)SARS-COV-2 Vaccination 03/26/2019, 04/17/2019   Pneumococcal Polysaccharide-23 04/12/2007, 03/07/2021   Td 02/06/2001   Td (Adult), 2 Lf Tetanus Toxid, Preservative Free 02/06/2001   Tdap 02/07/2003, 05/06/2013, 07/22/2018   Zoster Recombinat (Shingrix) 07/22/2018, 10/02/2018    Health Maintenance  Topic Date Due   Pneumonia Vaccine 67 Years old (2 -  PCV) 03/07/2022   Medicare Annual Wellness (AWV)  03/07/2022   COVID-19 Vaccine (3 - 2023-24 season) 03/05/2022 (Originally 10/07/2021)   INFLUENZA VACCINE  05/07/2022 (Originally 09/06/2021)   OPHTHALMOLOGY EXAM  06/23/2022   FOOT EXAM  07/09/2022   MAMMOGRAM  07/29/2022   HEMOGLOBIN A1C  08/14/2022   Diabetic kidney evaluation - eGFR measurement  02/14/2023   Diabetic kidney evaluation - Urine ACR  02/14/2023   COLONOSCOPY (Pts 45-83yr Insurance coverage will need to be confirmed)  09/19/2027   DTaP/Tdap/Td (6 - Td or Tdap) 07/21/2028   DEXA SCAN  Completed   Hepatitis C Screening  Completed   Zoster Vaccines- Shingrix  Completed   HPV VACCINES  Aged Out    Discussed health benefits of physical activity, and encouraged her to engage in regular exercise appropriate for her age and condition.  .Marland KitchenMechele Claudewas seen today for annual exam.  Diagnoses and all orders for this visit:  Routine physical examination -     Cancel: TSH -     Lipid Panel w/reflex Direct LDL -     COMPLETE METABOLIC PANEL WITH GFR -     CBC with Differential/Platelet -     Hemoglobin A1c -     Microalbumin / creatinine urine ratio -     Lipid Panel w/reflex Direct LDL -     COMPLETE METABOLIC PANEL WITH GFR -     Hemoglobin A1c -     VITAMIN D 25 Hydroxy (Vit-D Deficiency, Fractures)  Type 2 diabetes mellitus with hyperglycemia, without long-term current use of insulin ([facility]) -     COMPLETE METABOLIC PANEL WITH GFR -     Hemoglobin A1c -     Urine Microalbumin w/creat. ratio  Essential hypertension -     COMPLETE METABOLIC PANEL WITH GFR -     losartan (COZAAR) 25 MG tablet; Take 1 tablet (25 mg total) by mouth daily.  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 Needs appt  HYPERCHOLESTEROLEMIA -     Lipid Panel w/reflex Direct LDL  Dyslipidemia, goal LDL below 70  Age-related osteoporosis without current pathological fracture -     alendronate (FOSAMAX) 70 MG tablet; Take 1 tablet (70 mg total) by mouth every 7 (seven) days.  .. Discussed 150 minutes of exercise a week.  Encouraged vitamin D 1000 units and Calcium '1300mg'$  or 4 servings of dairy a day.  Fasting labs ordered today Mammogram/bone density UTD Colonoscopy UTD 6CIT normal Memory labs ordered Discussed memory compensation strategies A1C to check sugars and discuss medications Not taking ozempic due to nausea Follow up in 6 months       JIran Planas PA-C

## 2022-02-18 ENCOUNTER — Other Ambulatory Visit (HOSPITAL_COMMUNITY): Payer: Self-pay

## 2022-02-18 ENCOUNTER — Other Ambulatory Visit: Payer: Self-pay

## 2022-02-20 ENCOUNTER — Other Ambulatory Visit (HOSPITAL_COMMUNITY): Payer: Self-pay

## 2022-02-22 ENCOUNTER — Other Ambulatory Visit (HOSPITAL_COMMUNITY): Payer: Self-pay

## 2022-02-22 ENCOUNTER — Other Ambulatory Visit: Payer: Self-pay

## 2022-02-22 MED ORDER — ALPRAZOLAM 0.25 MG PO TABS
0.2500 mg | ORAL_TABLET | ORAL | 0 refills | Status: DC
  Filled 2022-02-22: qty 4, 2d supply, fill #0

## 2022-02-28 ENCOUNTER — Other Ambulatory Visit (HOSPITAL_COMMUNITY): Payer: Self-pay

## 2022-02-28 DIAGNOSIS — R350 Frequency of micturition: Secondary | ICD-10-CM | POA: Diagnosis not present

## 2022-02-28 MED ORDER — CIPROFLOXACIN HCL 250 MG PO TABS
250.0000 mg | ORAL_TABLET | Freq: Two times a day (BID) | ORAL | 0 refills | Status: DC
  Filled 2022-02-28: qty 14, 7d supply, fill #0

## 2022-03-01 ENCOUNTER — Telehealth: Payer: Self-pay

## 2022-03-01 ENCOUNTER — Other Ambulatory Visit: Payer: Self-pay

## 2022-03-01 NOTE — Telephone Encounter (Addendum)
Initiated Prior authorization [id number] [facility]) 2.5 MG/0.5ML Via: Covermymeds Case/Key:[id number] Status: approved as of 03/01/22 Reason:24-JAN-24:24-JAN-25 [place] 2.'5MG'$ /0.5ML [place] SOPN  Notified Pt via: Mychart

## 2022-03-04 ENCOUNTER — Other Ambulatory Visit (HOSPITAL_COMMUNITY): Payer: Self-pay

## 2022-03-06 ENCOUNTER — Other Ambulatory Visit: Payer: Self-pay

## 2022-03-10 DIAGNOSIS — R350 Frequency of micturition: Secondary | ICD-10-CM | POA: Diagnosis not present

## 2022-03-15 ENCOUNTER — Other Ambulatory Visit (HOSPITAL_COMMUNITY): Payer: Self-pay

## 2022-03-15 ENCOUNTER — Other Ambulatory Visit: Payer: Self-pay

## 2022-03-15 MED ORDER — NITROFURANTOIN MACROCRYSTAL 100 MG PO CAPS
100.0000 mg | ORAL_CAPSULE | Freq: Two times a day (BID) | ORAL | 0 refills | Status: DC
  Filled 2022-03-15: qty 14, 7d supply, fill #0

## 2022-03-23 ENCOUNTER — Other Ambulatory Visit (HOSPITAL_COMMUNITY): Payer: Self-pay

## 2022-03-30 DIAGNOSIS — R350 Frequency of micturition: Secondary | ICD-10-CM | POA: Diagnosis not present

## 2022-04-03 ENCOUNTER — Other Ambulatory Visit (HOSPITAL_COMMUNITY): Payer: Self-pay

## 2022-04-03 MED ORDER — CEPHALEXIN 500 MG PO CAPS
500.0000 mg | ORAL_CAPSULE | Freq: Three times a day (TID) | ORAL | 0 refills | Status: DC
  Filled 2022-04-03: qty 21, 7d supply, fill #0

## 2022-04-04 ENCOUNTER — Other Ambulatory Visit (HOSPITAL_COMMUNITY): Payer: Self-pay

## 2022-04-04 ENCOUNTER — Other Ambulatory Visit: Payer: Self-pay

## 2022-04-06 ENCOUNTER — Other Ambulatory Visit: Payer: Self-pay

## 2022-04-14 ENCOUNTER — Other Ambulatory Visit (HOSPITAL_COMMUNITY): Payer: Self-pay

## 2022-04-14 MED ORDER — CEPHALEXIN 250 MG PO CAPS
250.0000 mg | ORAL_CAPSULE | Freq: Every day | ORAL | 3 refills | Status: DC
  Filled 2022-04-14: qty 30, 30d supply, fill #0

## 2022-04-15 ENCOUNTER — Other Ambulatory Visit (HOSPITAL_COMMUNITY): Payer: Self-pay

## 2022-04-15 MED ORDER — TRIMETHOPRIM 100 MG PO TABS
100.0000 mg | ORAL_TABLET | Freq: Every day | ORAL | 11 refills | Status: DC
  Filled 2022-04-15 – 2022-04-19 (×3): qty 30, 30d supply, fill #0
  Filled 2022-05-09 – 2022-05-16 (×2): qty 30, 30d supply, fill #1
  Filled 2022-07-02 – 2022-07-10 (×2): qty 30, 30d supply, fill #2

## 2022-04-17 ENCOUNTER — Other Ambulatory Visit (HOSPITAL_COMMUNITY): Payer: Self-pay

## 2022-04-19 ENCOUNTER — Other Ambulatory Visit (HOSPITAL_COMMUNITY): Payer: Self-pay

## 2022-04-24 ENCOUNTER — Other Ambulatory Visit (HOSPITAL_COMMUNITY): Payer: Self-pay

## 2022-05-05 ENCOUNTER — Other Ambulatory Visit: Payer: Self-pay | Admitting: Physician Assistant

## 2022-05-05 DIAGNOSIS — Z1231 Encounter for screening mammogram for malignant neoplasm of breast: Secondary | ICD-10-CM

## 2022-05-09 ENCOUNTER — Encounter: Payer: Self-pay | Admitting: Physician Assistant

## 2022-05-09 ENCOUNTER — Other Ambulatory Visit: Payer: Self-pay | Admitting: Physician Assistant

## 2022-05-09 ENCOUNTER — Other Ambulatory Visit (HOSPITAL_COMMUNITY): Payer: Self-pay

## 2022-05-09 ENCOUNTER — Other Ambulatory Visit: Payer: Self-pay

## 2022-05-09 DIAGNOSIS — I10 Essential (primary) hypertension: Secondary | ICD-10-CM

## 2022-05-09 MED ORDER — SPIRONOLACTONE 25 MG PO TABS
25.0000 mg | ORAL_TABLET | Freq: Every day | ORAL | 0 refills | Status: DC
  Filled 2022-05-09: qty 90, 90d supply, fill #0

## 2022-05-10 ENCOUNTER — Other Ambulatory Visit: Payer: Self-pay

## 2022-05-10 ENCOUNTER — Other Ambulatory Visit (HOSPITAL_COMMUNITY): Payer: Self-pay

## 2022-05-11 ENCOUNTER — Other Ambulatory Visit (HOSPITAL_COMMUNITY): Payer: Self-pay

## 2022-05-11 ENCOUNTER — Other Ambulatory Visit: Payer: Self-pay

## 2022-05-11 MED ORDER — MIRABEGRON ER 50 MG PO TB24
50.0000 mg | ORAL_TABLET | Freq: Every day | ORAL | 11 refills | Status: DC
  Filled 2022-05-11: qty 30, 30d supply, fill #0

## 2022-05-15 ENCOUNTER — Other Ambulatory Visit (HOSPITAL_COMMUNITY): Payer: Self-pay

## 2022-05-15 ENCOUNTER — Other Ambulatory Visit: Payer: Self-pay

## 2022-05-16 ENCOUNTER — Other Ambulatory Visit (HOSPITAL_COMMUNITY): Payer: Self-pay

## 2022-05-24 DIAGNOSIS — N3946 Mixed incontinence: Secondary | ICD-10-CM | POA: Diagnosis not present

## 2022-05-24 DIAGNOSIS — R350 Frequency of micturition: Secondary | ICD-10-CM | POA: Diagnosis not present

## 2022-05-26 ENCOUNTER — Other Ambulatory Visit (HOSPITAL_COMMUNITY): Payer: Self-pay

## 2022-05-26 ENCOUNTER — Other Ambulatory Visit: Payer: Self-pay

## 2022-05-26 MED ORDER — CEPHALEXIN 500 MG PO CAPS
500.0000 mg | ORAL_CAPSULE | Freq: Three times a day (TID) | ORAL | 0 refills | Status: DC
  Filled 2022-05-26 – 2022-05-27 (×2): qty 21, 7d supply, fill #0

## 2022-05-27 ENCOUNTER — Other Ambulatory Visit (HOSPITAL_COMMUNITY): Payer: Self-pay

## 2022-05-27 MED ORDER — NITROFURANTOIN MACROCRYSTAL 50 MG PO CAPS
ORAL_CAPSULE | ORAL | 11 refills | Status: DC
  Filled 2022-05-27: qty 30, 30d supply, fill #0
  Filled 2022-07-03: qty 30, 30d supply, fill #1
  Filled 2022-09-12: qty 30, 30d supply, fill #2
  Filled 2022-09-29 – 2022-10-10 (×3): qty 30, 30d supply, fill #3
  Filled 2022-11-05: qty 30, 30d supply, fill #4
  Filled 2022-12-14 – 2023-01-12 (×3): qty 30, 30d supply, fill #5
  Filled 2023-02-10 – 2023-02-12 (×2): qty 30, 30d supply, fill #6
  Filled 2023-03-20: qty 30, 30d supply, fill #7
  Filled 2023-04-25 (×2): qty 30, 30d supply, fill #8

## 2022-06-06 ENCOUNTER — Other Ambulatory Visit (HOSPITAL_COMMUNITY): Payer: Self-pay

## 2022-06-26 ENCOUNTER — Other Ambulatory Visit (HOSPITAL_COMMUNITY): Payer: Self-pay

## 2022-06-26 ENCOUNTER — Telehealth (INDEPENDENT_AMBULATORY_CARE_PROVIDER_SITE_OTHER): Payer: PPO | Admitting: Physician Assistant

## 2022-06-26 ENCOUNTER — Encounter: Payer: Self-pay | Admitting: Physician Assistant

## 2022-06-26 VITALS — Wt 208.0 lb

## 2022-06-26 DIAGNOSIS — M25561 Pain in right knee: Secondary | ICD-10-CM | POA: Diagnosis not present

## 2022-06-26 DIAGNOSIS — M25511 Pain in right shoulder: Secondary | ICD-10-CM | POA: Diagnosis not present

## 2022-06-26 DIAGNOSIS — I10 Essential (primary) hypertension: Secondary | ICD-10-CM

## 2022-06-26 DIAGNOSIS — M25562 Pain in left knee: Secondary | ICD-10-CM

## 2022-06-26 DIAGNOSIS — Z820 Family history of epilepsy and other diseases of the nervous system: Secondary | ICD-10-CM | POA: Diagnosis not present

## 2022-06-26 DIAGNOSIS — N390 Urinary tract infection, site not specified: Secondary | ICD-10-CM

## 2022-06-26 DIAGNOSIS — G8929 Other chronic pain: Secondary | ICD-10-CM

## 2022-06-26 MED ORDER — ESTRADIOL 0.1 MG/GM VA CREA
TOPICAL_CREAM | VAGINAL | 1 refills | Status: DC
Start: 2022-06-26 — End: 2023-07-11
  Filled 2022-06-26: qty 42.5, 30d supply, fill #0
  Filled 2023-01-12 – 2023-01-22 (×2): qty 42.5, 30d supply, fill #1

## 2022-06-26 NOTE — Progress Notes (Signed)
..Virtual Visit via Video Note  I connected with Amanda [name] on 06/26/22 at  8:50 AM EDT by a video enabled telemedicine application and verified that I am speaking with the correct person using two identifiers.  Location: Patient: car Provider: clinic  .Marland KitchenParticipating in visit:  Patient: Amanda [name] Provider: Tandy Gaw PA-C   I discussed the limitations of evaluation and management by telemedicine and the availability of in person appointments. The patient expressed understanding and agreed to proceed.  History of Present Illness: Pt is a 67 yo female who calls in to follow up on medications and health.   She is in a study for parkinsons disease and had a DAT scan. She is just making me aware if I get any results. Her father died of parkinsons. She is having more and more problems standing and getting adjusted. She is having low back and bilateral knee pain. Hx of knee replacement in right knee.   She is having some right anterior shoulder pain episodically as well. Radiates down right arm at times. Not with exertion. No other CP, palpitations, reflux, SOB. Admits to lifting more and being more active lately. Seems to be more after lifting.   Continues to have frequent UTIs, seeing urology.       .. Active Ambulatory Problems    Diagnosis Date Noted   HYPERCHOLESTEROLEMIA 09/05/2006   Factor V deficiency (HCC) 09/05/2006   Essential hypertension 05/16/2007   DIVERTICULOSIS, COLON 10/18/2006   Constipation 06/21/2009   FATTY LIVER DISEASE 09/12/2007   ARTHRITIS 05/16/2007   LEG CRAMPS 07/17/2009   Obesity 05/23/2010   Status post laparoscopic sleeve gastrectomy 03/04/2018   Bilateral primary osteoarthritis of knee 03/05/2018   Gout 03/05/2018   Chronic pain of both knees 03/05/2018   Hammer toe of left foot 07/23/2018   Epigastric pain 07/23/2018   Onychomycosis 07/23/2018   OA (osteoarthritis) of knee 07/21/2019   Memory changes 07/12/2020   Right hip pain 07/12/2020    Chronic right-sided low back pain without sciatica 07/12/2020   Numbness and tingling of right side of face 07/12/2020   Osteoporosis 07/12/2020   Pap smear abnormality of cervix/human papillomavirus (HPV) positive 07/12/2020   Groin pain, right 08/03/2020   OAB (overactive bladder) 03/07/2021   Mixed stress and urge urinary incontinence 03/07/2021   Urinary frequency 03/07/2021   Type 2 diabetes mellitus with hyperglycemia, without long-term current use of insulin (HCC) 03/18/2021   Class 1 obesity due to excess calories with serious comorbidity and body mass index (BMI) of 30.0 to 30.9 in adult 07/13/2021   Dyslipidemia, goal LDL below 70 07/13/2021   Urinary incontinence without sensory awareness 02/13/2022   Post-menopausal 02/17/2022   Recurrent UTI 06/26/2022   Family history of Parkinson disease 06/26/2022   Resolved Ambulatory Problems    Diagnosis Date Noted   Acute cystitis 07/17/2009   SLEEP APNEA 10/25/2007   Acute pain of right thigh 08/03/2020   Past Medical History:  Diagnosis Date   Allergy    Arthritis    Blood dyscrasia    CPAP (continuous positive airway pressure) dependence    Diabetes mellitus without complication (HCC)    Fatty liver 4-08   Hip pain    Hypertension    Knee pain    Pneumonia    Sleep apnea        Observations/Objective: No acute distress Normal mood and appearance Normal breathing  .Marland Kitchen Today's Vitals   06/26/22 0839  Weight: 208 lb (94.3 kg)   Body  mass index is 31.63 kg/m.     Assessment and Plan: .Marland KitchenNandi was seen today for follow-up.  Diagnoses and all orders for this visit:  Recurrent UTI -     estradiol (ESTRACE VAGINAL) 0.1 MG/GM vaginal cream; Apply pea-sized amount over urethral opening daily.  Chronic pain of both knees -     Ambulatory referral to Orthopedic Surgery  Family history of Parkinson disease  Essential hypertension   Continue to follow up with urology. Add estrogen cream over urethral  opening to see if helps prevention.   Referral to ortho for knee pain.   Will be on look out for parkinson disease work up.   Discussed anterior right shoulder pain sounded like biceps tendoinitis  Use voltaren gel, ice and look up exercises.    Follow Up Instructions:    I discussed the assessment and treatment plan with the patient. The patient was provided an opportunity to ask questions and all were answered. The patient agreed with the plan and demonstrated an understanding of the instructions.   The patient was advised to call back or seek an in-person evaluation if the symptoms worsen or if the condition fails to improve as anticipated.   Tandy Gaw, PA-C

## 2022-06-26 NOTE — Progress Notes (Signed)
Pt want to talk a about a clinical trail and upper right chest pain  and left thumb mid back back pain and bilateral knee pain

## 2022-06-28 ENCOUNTER — Ambulatory Visit (INDEPENDENT_AMBULATORY_CARE_PROVIDER_SITE_OTHER): Payer: PPO

## 2022-06-28 DIAGNOSIS — Z1231 Encounter for screening mammogram for malignant neoplasm of breast: Secondary | ICD-10-CM | POA: Diagnosis not present

## 2022-07-03 ENCOUNTER — Other Ambulatory Visit: Payer: Self-pay | Admitting: Physician Assistant

## 2022-07-03 DIAGNOSIS — G8929 Other chronic pain: Secondary | ICD-10-CM

## 2022-07-03 DIAGNOSIS — E7800 Pure hypercholesterolemia, unspecified: Secondary | ICD-10-CM

## 2022-07-03 DIAGNOSIS — E785 Hyperlipidemia, unspecified: Secondary | ICD-10-CM

## 2022-07-03 DIAGNOSIS — R1031 Right lower quadrant pain: Secondary | ICD-10-CM

## 2022-07-03 DIAGNOSIS — I10 Essential (primary) hypertension: Secondary | ICD-10-CM

## 2022-07-04 ENCOUNTER — Other Ambulatory Visit: Payer: Self-pay

## 2022-07-04 ENCOUNTER — Other Ambulatory Visit (HOSPITAL_COMMUNITY): Payer: Self-pay

## 2022-07-04 MED ORDER — GABAPENTIN 100 MG PO CAPS
100.0000 mg | ORAL_CAPSULE | Freq: Three times a day (TID) | ORAL | 3 refills | Status: DC
Start: 2022-07-04 — End: 2023-06-01
  Filled 2022-07-04: qty 90, 10d supply, fill #0
  Filled 2022-09-14: qty 90, 10d supply, fill #1
  Filled 2022-10-08: qty 90, 10d supply, fill #2
  Filled 2023-01-12 – 2023-01-22 (×2): qty 90, 10d supply, fill #3

## 2022-07-04 MED ORDER — LOSARTAN POTASSIUM 25 MG PO TABS
25.0000 mg | ORAL_TABLET | Freq: Every day | ORAL | 1 refills | Status: DC
Start: 2022-07-04 — End: 2023-04-25
  Filled 2022-07-04 – 2022-09-03 (×2): qty 90, 90d supply, fill #0
  Filled 2023-01-12: qty 90, 90d supply, fill #1

## 2022-07-04 MED ORDER — SPIRONOLACTONE 25 MG PO TABS
25.0000 mg | ORAL_TABLET | Freq: Every day | ORAL | 0 refills | Status: DC
Start: 2022-07-04 — End: 2022-12-14
  Filled 2022-07-04 – 2022-08-20 (×2): qty 90, 90d supply, fill #0

## 2022-07-04 MED ORDER — ATORVASTATIN CALCIUM 40 MG PO TABS
40.0000 mg | ORAL_TABLET | Freq: Every day | ORAL | 3 refills | Status: DC
Start: 2022-07-04 — End: 2023-07-31
  Filled 2022-07-04: qty 90, 90d supply, fill #0
  Filled 2022-10-22: qty 90, 90d supply, fill #1
  Filled 2023-01-12: qty 90, 90d supply, fill #2
  Filled 2023-04-25 (×2): qty 90, 90d supply, fill #3

## 2022-07-04 NOTE — Progress Notes (Signed)
Normal mammogram. Follow up in 1 year.

## 2022-07-05 ENCOUNTER — Other Ambulatory Visit (HOSPITAL_COMMUNITY): Payer: Self-pay

## 2022-07-10 ENCOUNTER — Other Ambulatory Visit (HOSPITAL_COMMUNITY): Payer: Self-pay

## 2022-07-11 ENCOUNTER — Other Ambulatory Visit (HOSPITAL_COMMUNITY): Payer: Self-pay

## 2022-07-11 MED ORDER — MIRABEGRON ER 50 MG PO TB24
ORAL_TABLET | ORAL | 11 refills | Status: DC
  Filled 2022-07-11: qty 30, 365d supply, fill #0

## 2022-07-12 ENCOUNTER — Other Ambulatory Visit (HOSPITAL_COMMUNITY): Payer: Self-pay

## 2022-07-12 MED ORDER — GEMTESA 75 MG PO TABS
75.0000 mg | ORAL_TABLET | Freq: Every day | ORAL | 5 refills | Status: DC
  Filled 2022-07-12: qty 30, 30d supply, fill #0
  Filled 2022-08-06: qty 30, 30d supply, fill #1
  Filled 2022-09-12: qty 10, 10d supply, fill #2

## 2022-07-13 ENCOUNTER — Other Ambulatory Visit: Payer: Self-pay

## 2022-07-13 ENCOUNTER — Other Ambulatory Visit (HOSPITAL_COMMUNITY): Payer: Self-pay

## 2022-07-17 ENCOUNTER — Encounter: Payer: Self-pay | Admitting: Physician Assistant

## 2022-07-17 ENCOUNTER — Ambulatory Visit (INDEPENDENT_AMBULATORY_CARE_PROVIDER_SITE_OTHER): Payer: PPO

## 2022-07-17 ENCOUNTER — Ambulatory Visit (INDEPENDENT_AMBULATORY_CARE_PROVIDER_SITE_OTHER): Payer: PPO | Admitting: Physician Assistant

## 2022-07-17 VITALS — BP 139/67 | HR 62 | Ht 67.0 in | Wt 210.2 lb

## 2022-07-17 DIAGNOSIS — K2900 Acute gastritis without bleeding: Secondary | ICD-10-CM | POA: Diagnosis not present

## 2022-07-17 DIAGNOSIS — Z903 Acquired absence of stomach [part of]: Secondary | ICD-10-CM

## 2022-07-17 DIAGNOSIS — R0781 Pleurodynia: Secondary | ICD-10-CM

## 2022-07-17 NOTE — Progress Notes (Unsigned)
Acute Office Visit  Subjective:     Patient ID: Amanda [name], female    DOB: 02/08/1955, 67 y.o.   MRN: [id number]  Chief Complaint  Patient presents with   Abdominal Pain    Patient states she had a gastric by-pass 10- years ago and was told to NOT drink coffee -but she has been drinking coffee in the past month or so.     pain just under left breast    Patient c/o pain just under Left breast - has been there for a long time between breast on left side but now worse under Left breath since MMG about one month ago.     Abdominal Pain Pertinent negatives include no constipation, diarrhea, nausea or vomiting.   Patient with a PSH of bariatric surgery presents with substernal upper abdominal pain present since her last appointment approx 27mo ago. After this visit she stopped taking omeprazole and then the pain began. She has also started fosamax. It is worse with eating although bland foods and liquids are less painful. She has She denies n/v, diarrhea, constipation, bloody stools, SOB, CP, and palpitations. She denies NSAID use. She was told not to drink coffee and she has been drinking coffee for the last month as well.   Patient also complains of sharp rib pain under her left breast for the last month following her mammogram. She states that they were "rough" during the procedure. Pain is elicited by touch and is worst when deep breathing. Pain is not present at rest. She denies bruising, bleeding, numbness, and changes to the breast or nipple.  Review of Systems  Respiratory:  Negative for cough and shortness of breath.   Cardiovascular:  Negative for chest pain, palpitations and leg swelling.  Gastrointestinal:  Positive for abdominal pain. Negative for blood in stool, constipation, diarrhea, heartburn, nausea and vomiting.  Skin:  Negative for itching and rash.  Neurological:  Negative for dizziness, tingling and sensory change.        Objective:    BP 139/67   Pulse 62    Ht 5\' 7"  (1.702 m)   Wt 210 lb 4 oz (95.4 kg)   SpO2 99%   BMI 32.93 kg/m  BP Readings from Last 3 Encounters:  07/17/22 139/67  02/13/22 (!) 120/95  07/08/21 107/60   Wt Readings from Last 3 Encounters:  07/17/22 210 lb 4 oz (95.4 kg)  06/26/22 208 lb (94.3 kg)  02/13/22 208 lb (94.3 kg)      Physical Exam Constitutional:      Appearance: She is well-developed. She is obese.  Cardiovascular:     Rate and Rhythm: Normal rate and regular rhythm.     Heart sounds: Normal heart sounds.  Pulmonary:     Effort: Pulmonary effort is normal.     Breath sounds: Normal breath sounds.     Comments: Tenderness along the medial aspect of the left 5th rib. No bruising, erythema, or skin changes. Chest:     Chest wall: Tenderness present.  Abdominal:     General: Bowel sounds are normal. There are no signs of injury.     Palpations: Abdomen is soft. There is no hepatomegaly or mass.     Tenderness: There is no guarding. Negative signs include Murphy's sign and McBurney's sign.     Comments: LUQ tender to deep palpation  Skin:    Findings: No erythema or rash.  Neurological:     General: No focal deficit present.  Mental Status: She is alert and oriented to person, place, and time.  Psychiatric:        Mood and Affect: Mood normal.        Behavior: Behavior normal.         Assessment & Plan:  ,..Amanda [name] was seen today for abdominal pain and pain just under left breast.  Diagnoses and all orders for this visit:  Acute gastritis without hemorrhage, unspecified gastritis type  Rib pain on left side -     DG Ribs Unilateral W/Chest Left; Future  History of sleeve gastrectomy   Epigastric symptoms today certainly sound like gastritis without complication Fosamax can also make reflux symptoms worse and then stopping omeprazole would just exacerbate symptoms Restart omeprazole just do not take any other medications at the same time you take fosamax Discussed foods and  drinks like coffee that worsen reflux Follow up in 2-3 weeks if symptoms not better or worsening  Do not think left rib pain is connected to epigastric pain Will get xray of chest and ribs Ice area Could be some cartiledge inflammation or even a non displaced fracture due to her osteoporosis and "rough" mammogram.     Tandy Gaw, PA-C

## 2022-07-17 NOTE — Patient Instructions (Signed)
Gastritis, Adult Gastritis is inflammation of the stomach. There are two kinds of gastritis: Acute gastritis. This kind develops suddenly. Chronic gastritis. This kind is much more common. It develops slowly and lasts for a long time. Gastritis happens when the lining of the stomach becomes weak or gets damaged. Without treatment, gastritis can lead to stomach bleeding and ulcers. What are the causes? This condition may be caused by: An infection. Drinking too much alcohol. Certain medicines. These include steroids, antibiotics, and some over-the-counter medicines, such as aspirin or ibuprofen. Having too much acid in the stomach. Having a disease of the stomach. Other causes may include: An allergic reaction. Some cancer treatments (radiation). Smoking cigarettes or the use of products that contain nicotine or tobacco. In some cases, the cause of this condition is not known. What increases the risk? Having a disease of the intestines. Having a disease in which the body's immune system attacks the body (autoimmune disease), such as Crohn's disease. Using aspirin or ibuprofen and other NSAIDs to treat other conditions, such as heart disease or chronic pain. Stress. What are the signs or symptoms? Symptoms of this condition include: Pain or a burning sensation in the upper abdomen. Nausea. Vomiting. An uncomfortable feeling of fullness after eating. Weight loss. Bad breath. Blood in your vomit or stool (feces). In some cases, there are no symptoms. How is this diagnosed? This condition may be diagnosed based on your medical history, a physical exam, and tests. Tests may include: Your medical history and a description of your symptoms. A physical exam. Tests. These can include: Blood tests. Stool tests. A test in which a thin, flexible instrument with a light and a camera is passed down the esophagus and into the stomach (upper endoscopy). A test in which a tissue sample is  removed to look at it under a microscope (biopsy). How is this treated? This condition may be treated with medicines. The medicines that are used vary depending on the cause of the gastritis. If the condition is caused by a bacterial infection, you may be given antibiotic medicines. If the condition is caused by too much acid in the stomach, you may be given medicines called H2 blockers, proton pump inhibitors, or antacids. Treatment may also involve stopping the use of certain medicines such as aspirin or ibuprofen and other NSAIDs. Follow these instructions at home: Medicines Take over-the-counter and prescription medicines only as told by your health care provider. If you were prescribed an antibiotic medicine, take it as told by your health care provider. Do not stop taking the antibiotic even if you start to feel better. Alcohol use Do not drink alcohol if: Your health care provider tells you not to drink. You are pregnant, may be pregnant, or are planning to become pregnant. If you drink alcohol: Limit your use to: 0-1 drink a day for women. 0-2 drinks a day for men. Know how much alcohol is in your drink. In the [place] one drink equals one 12 oz bottle of beer (355 mL), one 5 oz glass of wine (148 mL), or one 1 oz glass of hard liquor (44 mL). General instructions  Eat small, frequent meals instead of large meals. Avoid foods and drinks that make your symptoms worse. Talk with your health care provider about ways to manage stress, such as getting regular exercise or practicing deep breathing, meditation, or yoga. Do not use any products that contain nicotine or tobacco. These products include cigarettes, chewing tobacco, and vaping devices, such as e-cigarettes.   If you need help quitting, ask your health care provider. Drink enough fluid to keep your urine pale yellow. Keep all follow-up visits. This is important. Contact a health care provider if: Your symptoms get worse. Your  abdominal pain gets worse. Your symptoms return after treatment. You have a fever. Get help right away if: You vomit blood or a substance that looks like coffee grounds. You have black or dark red stools. You are unable to keep fluids down. These symptoms may represent a serious problem that is an emergency. Do not wait to see if the symptoms will go away. Get medical help right away. Call your local emergency services (911 in the [place] Do not drive yourself to the hospital. Summary Gastritis is inflammation of the lining of the stomach that can occur suddenly (acute) or develop slowly over time (chronic). This condition is diagnosed with a medical history, a physical exam, or tests. This condition may be treated with medicines to treat infection or medicines to reduce the amount of acid in your stomach. Follow your health care provider's instructions about taking medicines, making changes to your diet, and knowing when to call for help. This information is not intended to replace advice given to you by your health care provider. Make sure you discuss any questions you have with your health care provider. Document Revised: 05/29/2020 Document Reviewed: 05/29/2020 Elsevier Patient Education  2024 [employer].

## 2022-07-18 ENCOUNTER — Encounter: Payer: Self-pay | Admitting: Physician Assistant

## 2022-07-24 ENCOUNTER — Other Ambulatory Visit (HOSPITAL_COMMUNITY): Payer: Self-pay

## 2022-07-24 ENCOUNTER — Other Ambulatory Visit: Payer: Self-pay

## 2022-07-24 NOTE — Progress Notes (Signed)
Chest clear. No signs of bone lesions or fracture over the ribs and where your pain is.

## 2022-08-01 DIAGNOSIS — N3946 Mixed incontinence: Secondary | ICD-10-CM | POA: Diagnosis not present

## 2022-08-01 DIAGNOSIS — R350 Frequency of micturition: Secondary | ICD-10-CM | POA: Diagnosis not present

## 2022-08-03 ENCOUNTER — Other Ambulatory Visit (HOSPITAL_COMMUNITY): Payer: Self-pay

## 2022-08-03 MED ORDER — CEPHALEXIN 500 MG PO CAPS
500.0000 mg | ORAL_CAPSULE | Freq: Three times a day (TID) | ORAL | 0 refills | Status: AC
  Filled 2022-08-03: qty 21, 7d supply, fill #0

## 2022-08-07 ENCOUNTER — Other Ambulatory Visit (HOSPITAL_COMMUNITY): Payer: Self-pay

## 2022-08-07 ENCOUNTER — Other Ambulatory Visit: Payer: Self-pay

## 2022-08-11 ENCOUNTER — Encounter (INDEPENDENT_AMBULATORY_CARE_PROVIDER_SITE_OTHER): Payer: Self-pay

## 2022-08-13 ENCOUNTER — Other Ambulatory Visit: Payer: Self-pay | Admitting: Physician Assistant

## 2022-08-13 DIAGNOSIS — M810 Age-related osteoporosis without current pathological fracture: Secondary | ICD-10-CM

## 2022-08-14 ENCOUNTER — Other Ambulatory Visit (HOSPITAL_COMMUNITY): Payer: Self-pay

## 2022-08-14 MED ORDER — ALENDRONATE SODIUM 70 MG PO TABS
70.0000 mg | ORAL_TABLET | ORAL | 1 refills | Status: DC
Start: 2022-08-14 — End: 2023-01-22
  Filled 2022-08-14: qty 12, 84d supply, fill #0
  Filled 2022-11-19: qty 12, 84d supply, fill #1

## 2022-08-21 ENCOUNTER — Other Ambulatory Visit (HOSPITAL_COMMUNITY): Payer: Self-pay

## 2022-08-21 ENCOUNTER — Other Ambulatory Visit: Payer: Self-pay

## 2022-08-24 DIAGNOSIS — M25551 Pain in right hip: Secondary | ICD-10-CM | POA: Diagnosis not present

## 2022-08-31 ENCOUNTER — Encounter: Payer: Self-pay | Admitting: Physical Therapy

## 2022-08-31 ENCOUNTER — Ambulatory Visit: Payer: PPO | Attending: Orthopedic Surgery | Admitting: Physical Therapy

## 2022-08-31 ENCOUNTER — Other Ambulatory Visit: Payer: Self-pay

## 2022-08-31 DIAGNOSIS — M6281 Muscle weakness (generalized): Secondary | ICD-10-CM | POA: Diagnosis not present

## 2022-08-31 DIAGNOSIS — M25551 Pain in right hip: Secondary | ICD-10-CM | POA: Insufficient documentation

## 2022-08-31 DIAGNOSIS — R2681 Unsteadiness on feet: Secondary | ICD-10-CM | POA: Insufficient documentation

## 2022-08-31 NOTE — Therapy (Signed)
OUTPATIENT PHYSICAL THERAPY LOWER EXTREMITY EVALUATION   Patient Name: Amanda [name] MRN: [id number] DOB:[birth year], 67 y.o., female Today's Date: 08/31/2022  END OF SESSION:  PT End of Session - 08/31/22 0806     Visit Number 1    Number of Visits 13    Date for PT Re-Evaluation 10/12/22    Authorization Type HTA    Authorization Time Period 08/31/22 to 10/12/22    Progress Note Due on Visit 10    PT Start Time 0804    PT Stop Time 0842    PT Time Calculation (min) 38 min    Activity Tolerance Patient tolerated treatment well    Behavior During Therapy [facility] for tasks assessed/performed             Past Medical History:  Diagnosis Date   Allergy    seasonal   Arthritis    all over   Blood dyscrasia    Carrys trait for Leiden Factor five never had any issues . Yhe only reason she was tested was because her mother had it   CPAP (continuous positive airway pressure) dependence    8 cm water w/ heated humidifier   Diabetes mellitus without complication (HCC)    Pre surgery - Gastric sx 2014 Roun-Y no diabetes since  no meds   Fatty liver 4-08   by [place]   Gout    Hip pain    Hypertension    Knee pain    Obesity    Pneumonia    Recurrent UTI    Sleep apnea    had roux en y lost 140lbs no longer needs cpap   Past Surgical History:  Procedure Laterality Date   APPENDECTOMY  1977   CESAREAN SECTION  1989   one   CHOLECYSTECTOMY  1977   DILATION AND CURETTAGE OF UTERUS  1987 & 1988   REDUCTION MAMMAPLASTY Bilateral 1985   ROUX-EN-Y GASTRIC BYPASS  2014   TOTAL KNEE ARTHROPLASTY Right 07/21/2019   Procedure: TOTAL KNEE ARTHROPLASTY;  Surgeon: Ollen Gross, MD;  Location: [facility];  Service: Orthopedics;  Laterality: Right;    Patient Active Problem List   Diagnosis Date Noted   Recurrent UTI 06/26/2022   Family history of Parkinson disease 06/26/2022   Post-menopausal 02/17/2022   Urinary incontinence without sensory awareness 02/13/2022   Class 1  obesity due to excess calories with serious comorbidity and body mass index (BMI) of 30.0 to 30.9 in adult 07/13/2021   Dyslipidemia, goal LDL below 70 07/13/2021   Type 2 diabetes mellitus with hyperglycemia, without long-term current use of insulin (HCC) 03/18/2021   OAB (overactive bladder) 03/07/2021   Mixed stress and urge urinary incontinence 03/07/2021   Urinary frequency 03/07/2021   Groin pain, right 08/03/2020   Memory changes 07/12/2020   Right hip pain 07/12/2020   Chronic right-sided low back pain without sciatica 07/12/2020   Numbness and tingling of right side of face 07/12/2020   Osteoporosis 07/12/2020   Pap smear abnormality of cervix/human papillomavirus (HPV) positive 07/12/2020   OA (osteoarthritis) of knee 07/21/2019   Hammer toe of left foot 07/23/2018   Epigastric pain 07/23/2018   Onychomycosis 07/23/2018   Bilateral primary osteoarthritis of knee 03/05/2018   Gout 03/05/2018   Chronic pain of both knees 03/05/2018   Status post laparoscopic sleeve gastrectomy 03/04/2018   Obesity 05/23/2010   LEG CRAMPS 07/17/2009   Constipation 06/21/2009   FATTY LIVER DISEASE 09/12/2007   Essential hypertension 05/16/2007  ARTHRITIS 05/16/2007   DIVERTICULOSIS, COLON 10/18/2006   HYPERCHOLESTEROLEMIA 09/05/2006   Factor V deficiency (HCC) 09/05/2006    PCP: Tandy Gaw PA-C   REFERRING PROVIDER: Ollen Gross, MD  REFERRING DIAG: M79.18 (ICD-10-CM) - Right buttock pain  THERAPY DIAG:  Pain in right hip  Muscle weakness (generalized)  Unsteadiness on feet  Rationale for Evaluation and Treatment: Rehabilitation  ONSET DATE: 3-4 months ago or maybe more   SUBJECTIVE:   SUBJECTIVE STATEMENT: This started at least 3-4 months ago, its been awhile. My posterior and my hip are hurting, when I take my first few steps it feels like the ball of my hip is popping out but I know that's not the case. I have to hold it, once I get moving I can walk a couple of  miles. I can only lift it but so far, it feels weak and grabby. No fall or injury that caused all of this. I'm in a Parkinsons study, my dad has parkinsons, I feel like when I first stand up I need a second to get going, I need a little bit to feel like I'm getting going. If I were to just get up and go I would fall, if I wait a second I'm OK.   PERTINENT HISTORY: OA, blood dyscrasia, CPAP dependence, DM, fatty liver, gout, hip pain, HTN, knee pain, obesity, recurrent UTI, roux-en-y bypass, TKA 2021 PAIN:  Are you having pain? Yes: NPRS scale: 3/10 Pain location: R glute and sometimes running up to back just above the waist line  Pain description: weak, grabby Aggravating factors: transitions/starting a movement  Relieving factors: back brace, heat    PRECAUTIONS: None  RED FLAGS: None   WEIGHT BEARING RESTRICTIONS: No  FALLS:  Has patient fallen in last 6 months? No  LIVING ENVIRONMENT: Lives with: lives with their family Lives in: House/apartment Stairs: 1-2 to enter  Has following equipment at home: Single point cane, Environmental consultant - 2 wheeled, and bed side commode  OCCUPATION: retired   PLOF: Independent, Independent with basic ADLs, Independent with gait, and Independent with transfers  PATIENT GOALS: feel steadier when I stand, get pain under control   NEXT MD VISIT: Dr. Despina Hick early-mid September 2024  OBJECTIVE:   DIAGNOSTIC FINDINGS:   PATIENT SURVEYS:  FOTO buttock pain not in FOTO system per front   COGNITION: Overall cognitive status: Within functional limits for tasks assessed     SENSATION: Not tested   MUSCLE LENGTH:  Hip flexors Mod limitation per functional observation L HS/Piriformis mild limitation R HS/Piriformis Moderate limitation     PALPATION: R piriformis tender, R TFL sore as well     LOWER EXTREMITY MMT:  MMT Right eval Left eval  Hip flexion 3- 3-  Hip extension 2 3+  Hip abduction 3- 4  Hip adduction    Hip internal rotation     Hip external rotation    Knee flexion 4- 4+  Knee extension 4 4+  Ankle dorsiflexion 5 5  Ankle plantarflexion    Ankle inversion    Ankle eversion     (Blank rows = not tested)   FUNCTIONAL TESTS:  5 times sit to stand: 20 seconds U UE on chair  Tandem stance 30 seconds B but shaky/wobbly, SLS RLE 3 seconds, L LE 10 seconds   GAIT: Distance walked: in clinic distances  Assistive device utilized: None Level of assistance: Complete Independence Comments: slow and steady, proximal weakness especially in hip abductors, tends to favor R LE  TODAY'S TREATMENT:                                                                                                                              DATE:   Eval   Objective measures, appropriate POC, care planning    TherEx  Bridges x5 HS stretch 1x30 seconds R LE Discussed and demonstrated tandem stance at counter Nustep L3 x6 minutes BLEs only    PATIENT EDUCATION:  Education details: POC, HEP, exam findings, "movement is medicine"  Person educated: Patient Education method: Explanation, Demonstration, and Handouts Education comprehension: verbalized understanding and returned demonstration  HOME EXERCISE PROGRAM: Access Code: [email or web address] URL: [email or web address] Date: 08/31/2022 Prepared by: Nedra Hai  Exercises - Beginner Bridge  - 1-2 x daily - 7 x weekly - 1 sets - 10 reps - 1 second  hold - Seated Hamstring Stretch  - 1-2 x daily - 7 x weekly - 1 sets - 3 reps - 30 seconds  hold - Tandem Stance  - 1-2 x daily - 7 x weekly - 1 sets - 3 reps - 30 seconds  hold  ASSESSMENT:  CLINICAL IMPRESSION: Patient is a 67 y.o. F who was seen today for physical therapy evaluation and treatment for R buttock pain. Exam with objective findings as above. Will benefit from skilled PT services to address all findings and assist in reaching optimal level of function.    OBJECTIVE IMPAIRMENTS: Abnormal gait, decreased  activity tolerance, decreased balance, decreased mobility, difficulty walking, decreased strength, increased fascial restrictions, increased muscle spasms, impaired flexibility, obesity, and pain.   ACTIVITY LIMITATIONS: standing, squatting, stairs, transfers, and locomotion level  PARTICIPATION LIMITATIONS: driving, shopping, community activity, and occupation  PERSONAL FACTORS: Age, Behavior pattern, Education, Fitness, Past/current experiences, and Time since onset of injury/illness/exacerbation are also affecting patient's functional outcome.   REHAB POTENTIAL: Fair sedentary lifestyle, chronicity of impairments   CLINICAL DECISION MAKING: Stable/uncomplicated  EVALUATION COMPLEXITY: Low   GOALS: Goals reviewed with patient? Yes  SHORT TERM GOALS: Target date: 09/21/2022   Will be compliant with appropriate progressive HEP  Baseline: Goal status: INITIAL  2.  Flexibility impairments to have improved by 50% Baseline:  Goal status: INITIAL  3.  R buttock pain to be no more than 2/10 at worst  Baseline:  Goal status: INITIAL    LONG TERM GOALS: Target date: 10/12/2022    MMT to have improved by at least 1 grade in all weak groups  Baseline:  Goal status: INITIAL  2.  Will report resolution of unsteadiness/inability to move well with initial transitions, also will perform 5x STS in 15 seconds or less no UEs  Baseline:  Goal status: INITIAL  3.  Will score at least 50/56 on Berg balance test  Baseline:  Goal status: INITIAL  4.  LEFS score to improve by at least 10 points  Baseline:  Goal status: INITIAL   PLAN:  PT FREQUENCY: 2x/week  PT DURATION: 6 weeks  PLANNED INTERVENTIONS: Therapeutic exercises, Therapeutic activity, Neuromuscular re-education, Balance training, Gait training, Patient/Family education, Self Care, Joint mobilization, Stair training, Aquatic Therapy, Dry Needling, Electrical stimulation, Spinal mobilization, Cryotherapy, Moist heat,  Taping, Ultrasound, Ionotophoresis 4mg /ml Dexamethasone, Manual therapy, and Re-evaluation  PLAN FOR NEXT SESSION: LEFS, Berg balance test; strength, flexibility, balance training   Nedra Hai, PT, DPT 08/31/22 8:45 AM

## 2022-09-03 ENCOUNTER — Other Ambulatory Visit (HOSPITAL_COMMUNITY): Payer: Self-pay

## 2022-09-04 ENCOUNTER — Encounter: Payer: PPO | Admitting: Physical Therapy

## 2022-09-04 ENCOUNTER — Other Ambulatory Visit (HOSPITAL_COMMUNITY): Payer: Self-pay

## 2022-09-05 ENCOUNTER — Ambulatory Visit: Payer: PPO

## 2022-09-05 DIAGNOSIS — M25551 Pain in right hip: Secondary | ICD-10-CM

## 2022-09-05 DIAGNOSIS — R2681 Unsteadiness on feet: Secondary | ICD-10-CM

## 2022-09-05 DIAGNOSIS — M6281 Muscle weakness (generalized): Secondary | ICD-10-CM

## 2022-09-05 NOTE — Therapy (Signed)
OUTPATIENT PHYSICAL THERAPY LOWER EXTREMITY TREATMENT   Patient Name: Amanda [name] MRN: [id number] DOB:[birth year], 67 y.o., female Today's Date: 09/05/2022  END OF SESSION:  PT End of Session - 09/05/22 1059     Visit Number 2    Number of Visits 13    Date for PT Re-Evaluation 10/12/22    Authorization Type HTA    Authorization Time Period 08/31/22 to 10/12/22    Progress Note Due on Visit 10    PT Start Time 1100    PT Stop Time 1140    PT Time Calculation (min) 40 min    Activity Tolerance Patient tolerated treatment well    Behavior During Therapy WFL for tasks assessed/performed             Past Medical History:  Diagnosis Date   Allergy    seasonal   Arthritis    all over   Blood dyscrasia    Carrys trait for Leiden Factor five never had any issues . Yhe only reason she was tested was because her mother had it   CPAP (continuous positive airway pressure) dependence    8 cm water w/ heated humidifier   Diabetes mellitus without complication (HCC)    Pre surgery - Gastric sx 2014 Roun-Y no diabetes since  no meds   Fatty liver 4-08   by [place]   Gout    Hip pain    Hypertension    Knee pain    Obesity    Pneumonia    Recurrent UTI    Sleep apnea    had roux en y lost 140lbs no longer needs cpap   Past Surgical History:  Procedure Laterality Date   APPENDECTOMY  1977   CESAREAN SECTION  1989   one   CHOLECYSTECTOMY  1977   DILATION AND CURETTAGE OF UTERUS  1987 & 1988   REDUCTION MAMMAPLASTY Bilateral 1985   ROUX-EN-Y GASTRIC BYPASS  2014   TOTAL KNEE ARTHROPLASTY Right 07/21/2019   Procedure: TOTAL KNEE ARTHROPLASTY;  Surgeon: Ollen Gross, MD;  Location: [facility];  Service: Orthopedics;  Laterality: Right;    Patient Active Problem List   Diagnosis Date Noted   Recurrent UTI 06/26/2022   Family history of Parkinson disease 06/26/2022   Post-menopausal 02/17/2022   Urinary incontinence without sensory awareness 02/13/2022   Class 1  obesity due to excess calories with serious comorbidity and body mass index (BMI) of 30.0 to 30.9 in adult 07/13/2021   Dyslipidemia, goal LDL below 70 07/13/2021   Type 2 diabetes mellitus with hyperglycemia, without long-term current use of insulin (HCC) 03/18/2021   OAB (overactive bladder) 03/07/2021   Mixed stress and urge urinary incontinence 03/07/2021   Urinary frequency 03/07/2021   Groin pain, right 08/03/2020   Memory changes 07/12/2020   Right hip pain 07/12/2020   Chronic right-sided low back pain without sciatica 07/12/2020   Numbness and tingling of right side of face 07/12/2020   Osteoporosis 07/12/2020   Pap smear abnormality of cervix/human papillomavirus (HPV) positive 07/12/2020   OA (osteoarthritis) of knee 07/21/2019   Hammer toe of left foot 07/23/2018   Epigastric pain 07/23/2018   Onychomycosis 07/23/2018   Bilateral primary osteoarthritis of knee 03/05/2018   Gout 03/05/2018   Chronic pain of both knees 03/05/2018   Status post laparoscopic sleeve gastrectomy 03/04/2018   Obesity 05/23/2010   LEG CRAMPS 07/17/2009   Constipation 06/21/2009   FATTY LIVER DISEASE 09/12/2007   Essential hypertension 05/16/2007  ARTHRITIS 05/16/2007   DIVERTICULOSIS, COLON 10/18/2006   HYPERCHOLESTEROLEMIA 09/05/2006   Factor V deficiency (HCC) 09/05/2006    PCP: Tandy Gaw PA-C   REFERRING PROVIDER: Ollen Gross, MD  REFERRING DIAG: M79.18 (ICD-10-CM) - Right buttock pain  THERAPY DIAG:  Pain in right hip  Muscle weakness (generalized)  Unsteadiness on feet  Rationale for Evaluation and Treatment: Rehabilitation  ONSET DATE: 3-4 months ago or maybe more   SUBJECTIVE:   SUBJECTIVE STATEMENT: Patient reports 4/10 buttock pain on R today. Patient is compliant with HEP.   EVAL: This started at least 3-4 months ago, its been awhile. My posterior and my hip are hurting, when I take my first few steps it feels like the ball of my hip is popping out but I  know that's not the case. I have to hold it, once I get moving I can walk a couple of miles. I can only lift it but so far, it feels weak and grabby. No fall or injury that caused all of this. I'm in a Parkinsons study, my dad has parkinsons, I feel like when I first stand up I need a second to get going, I need a little bit to feel like I'm getting going. If I were to just get up and go I would fall, if I wait a second I'm OK.   PERTINENT HISTORY: OA, blood dyscrasia, CPAP dependence, DM, fatty liver, gout, hip pain, HTN, knee pain, obesity, recurrent UTI, roux-en-y bypass, TKA 2021 PAIN:  Are you having pain? Yes: NPRS scale: 3/10 Pain location: R glute and sometimes running up to back just above the waist line  Pain description: weak, grabby Aggravating factors: transitions/starting a movement  Relieving factors: back brace, heat    PRECAUTIONS: None  RED FLAGS: None   WEIGHT BEARING RESTRICTIONS: No  FALLS:  Has patient fallen in last 6 months? No  LIVING ENVIRONMENT: Lives with: lives with their family Lives in: House/apartment Stairs: 1-2 to enter  Has following equipment at home: Single point cane, Environmental consultant - 2 wheeled, and bed side commode  OCCUPATION: retired   PLOF: Independent, Independent with basic ADLs, Independent with gait, and Independent with transfers  PATIENT GOALS: feel steadier when I stand, get pain under control   NEXT MD VISIT: Dr. Despina Hick early-mid September 2024  OBJECTIVE:   DIAGNOSTIC FINDINGS:   PATIENT SURVEYS:  FOTO buttock pain not in FOTO system per front   COGNITION: Overall cognitive status: Within functional limits for tasks assessed     SENSATION: Not tested   MUSCLE LENGTH:  Hip flexors Mod limitation per functional observation L HS/Piriformis mild limitation R HS/Piriformis Moderate limitation     PALPATION: R piriformis tender, R TFL sore as well     LOWER EXTREMITY MMT:  MMT Right eval Left eval  Hip flexion 3-  3-  Hip extension 2 3+  Hip abduction 3- 4  Hip adduction    Hip internal rotation    Hip external rotation    Knee flexion 4- 4+  Knee extension 4 4+  Ankle dorsiflexion 5 5  Ankle plantarflexion    Ankle inversion    Ankle eversion     (Blank rows = not tested)   FUNCTIONAL TESTS:  5 times sit to stand: 20 seconds U UE on chair  Tandem stance 30 seconds B but shaky/wobbly, SLS RLE 3 seconds, L LE 10 seconds   GAIT: Distance walked: in clinic distances  Assistive device utilized: None Level of assistance: Complete  Independence Comments: slow and steady, proximal weakness especially in hip abductors, tends to favor R LE    TODAY'S TREATMENT:    [facility] Adult PT Treatment:                                                DATE: 09/05/2022 Therapeutic Exercise: NuStep L3 x 8 min (seat 10, LE only) + LEFS intake Mat Table: Seated HS stretch (R propped on stool) Travell piriformis stretch (R) with strap 3x30" Bent knee fall out GTB x10 (B) Supine bridges + ball squeeze 2x10 S/L clamshells (R) x10 --> x5 RTB Prone bent knee hip ext (cramping)  Prone hip extension straight leg x5 (low back discomfort) Standing bent over counter: hip extension (R) 2x10                                                                                                                             DATE:   Eval   Objective measures, appropriate POC, care planning    TherEx  Bridges x5 HS stretch 1x30 seconds R LE Discussed and demonstrated tandem stance at counter Nustep L3 x6 minutes BLEs only    PATIENT EDUCATION:  Education details: Update HEP (clamshells starting no resistance) Person educated: Patient Education method: Explanation, Demonstration, and Handouts Education comprehension: verbalized understanding and returned demonstration  HOME EXERCISE PROGRAM: Access Code: [email or web address] URL: [email or web address] Date: 09/05/2022 Prepared by: Carlynn Herald  Exercises -  Beginner Bridge  - 1-2 x daily - 7 x weekly - 1 sets - 10 reps - 1 second  hold - Seated Hamstring Stretch  - 1-2 x daily - 7 x weekly - 1 sets - 3 reps - 30 seconds  hold - Tandem Stance  - 1-2 x daily - 7 x weekly - 1 sets - 3 reps - 30 seconds  hold - Clam with Resistance  - 1 x daily - 7 x weekly - 3 sets - 10 reps - Prone Hip Extension on Table  - 1 x daily - 7 x weekly - 3 sets - 10 reps  ASSESSMENT:  CLINICAL IMPRESSION: LEFS completed during NuStep warm-up. LE strengthening continued, focusing on glute activation and hip stability. Hip extension variations in prone were difficult for patient due to weakness and occasional cramping; patient able to complete modification to standing for exercise.    OBJECTIVE IMPAIRMENTS: Abnormal gait, decreased activity tolerance, decreased balance, decreased mobility, difficulty walking, decreased strength, increased fascial restrictions, increased muscle spasms, impaired flexibility, obesity, and pain.   ACTIVITY LIMITATIONS: standing, squatting, stairs, transfers, and locomotion level  PARTICIPATION LIMITATIONS: driving, shopping, community activity, and occupation  PERSONAL FACTORS: Age, Behavior pattern, Education, Fitness, Past/current experiences, and Time since onset of injury/illness/exacerbation are also affecting patient's functional outcome.   REHAB POTENTIAL: Fair sedentary lifestyle, chronicity of impairments   CLINICAL DECISION  MAKING: Stable/uncomplicated  EVALUATION COMPLEXITY: Low   GOALS: Goals reviewed with patient? Yes  SHORT TERM GOALS: Target date: 09/21/2022   Will be compliant with appropriate progressive HEP  Baseline: Goal status: INITIAL  2.  Flexibility impairments to have improved by 50% Baseline:  Goal status: INITIAL  3.  R buttock pain to be no more than 2/10 at worst  Baseline:  Goal status: INITIAL    LONG TERM GOALS: Target date: 10/12/2022  MMT to have improved by at least 1 grade in all weak  groups  Baseline:  Goal status: INITIAL  2.  Will report resolution of unsteadiness/inability to move well with initial transitions, also will perform 5x STS in 15 seconds or less no UEs  Baseline:  Goal status: INITIAL  3.  Will score at least 50/56 on Berg balance test  Baseline:  Goal status: INITIAL  4.  LEFS score to improve by at least 10 points  Baseline: 50% Goal status: INITIAL   PLAN:  PT FREQUENCY: 2x/week  PT DURATION: 6 weeks  PLANNED INTERVENTIONS: Therapeutic exercises, Therapeutic activity, Neuromuscular re-education, Balance training, Gait training, Patient/Family education, Self Care, Joint mobilization, Stair training, Aquatic Therapy, Dry Needling, Electrical stimulation, Spinal mobilization, Cryotherapy, Moist heat, Taping, Ultrasound, Ionotophoresis 4mg /ml Dexamethasone, Manual therapy, and Re-evaluation  PLAN FOR NEXT SESSION: Berg balance test; strength, flexibility, balance training    Carlynn Herald, PTA 09/05/2022 11:42 AM

## 2022-09-06 ENCOUNTER — Encounter: Payer: Self-pay | Admitting: Physical Therapy

## 2022-09-06 ENCOUNTER — Ambulatory Visit: Payer: PPO | Admitting: Physical Therapy

## 2022-09-06 DIAGNOSIS — M6281 Muscle weakness (generalized): Secondary | ICD-10-CM

## 2022-09-06 DIAGNOSIS — M25551 Pain in right hip: Secondary | ICD-10-CM | POA: Diagnosis not present

## 2022-09-06 DIAGNOSIS — R2681 Unsteadiness on feet: Secondary | ICD-10-CM

## 2022-09-06 NOTE — Therapy (Signed)
OUTPATIENT PHYSICAL THERAPY LOWER EXTREMITY TREATMENT   Patient Name: Amanda [name] MRN: [id number] DOB:[birth year], 67 y.o., female Today's Date: 09/06/2022  END OF SESSION:  PT End of Session - 09/06/22 1053     Visit Number 3    Number of Visits 13    Date for PT Re-Evaluation 10/12/22    Authorization Type HTA    Authorization Time Period 08/31/22 to 10/12/22    Authorization - Visit Number 3    Progress Note Due on Visit 10    PT Start Time 1015    PT Stop Time 1055    PT Time Calculation (min) 40 min    Activity Tolerance Patient tolerated treatment well    Behavior During Therapy WFL for tasks assessed/performed              Past Medical History:  Diagnosis Date   Allergy    seasonal   Arthritis    all over   Blood dyscrasia    Carrys trait for Leiden Factor five never had any issues . Yhe only reason she was tested was because her mother had it   CPAP (continuous positive airway pressure) dependence    8 cm water w/ heated humidifier   Diabetes mellitus without complication (HCC)    Pre surgery - Gastric sx 2014 Roun-Y no diabetes since  no meds   Fatty liver 4-08   by [place]   Gout    Hip pain    Hypertension    Knee pain    Obesity    Pneumonia    Recurrent UTI    Sleep apnea    had roux en y lost 140lbs no longer needs cpap   Past Surgical History:  Procedure Laterality Date   APPENDECTOMY  1977   CESAREAN SECTION  1989   one   CHOLECYSTECTOMY  1977   DILATION AND CURETTAGE OF UTERUS  1987 & 1988   REDUCTION MAMMAPLASTY Bilateral 1985   ROUX-EN-Y GASTRIC BYPASS  2014   TOTAL KNEE ARTHROPLASTY Right 07/21/2019   Procedure: TOTAL KNEE ARTHROPLASTY;  Surgeon: Ollen Gross, MD;  Location: [facility];  Service: Orthopedics;  Laterality: Right;    Patient Active Problem List   Diagnosis Date Noted   Recurrent UTI 06/26/2022   Family history of Parkinson disease 06/26/2022   Post-menopausal 02/17/2022   Urinary incontinence without sensory  awareness 02/13/2022   Class 1 obesity due to excess calories with serious comorbidity and body mass index (BMI) of 30.0 to 30.9 in adult 07/13/2021   Dyslipidemia, goal LDL below 70 07/13/2021   Type 2 diabetes mellitus with hyperglycemia, without long-term current use of insulin (HCC) 03/18/2021   OAB (overactive bladder) 03/07/2021   Mixed stress and urge urinary incontinence 03/07/2021   Urinary frequency 03/07/2021   Groin pain, right 08/03/2020   Memory changes 07/12/2020   Right hip pain 07/12/2020   Chronic right-sided low back pain without sciatica 07/12/2020   Numbness and tingling of right side of face 07/12/2020   Osteoporosis 07/12/2020   Pap smear abnormality of cervix/human papillomavirus (HPV) positive 07/12/2020   OA (osteoarthritis) of knee 07/21/2019   Hammer toe of left foot 07/23/2018   Epigastric pain 07/23/2018   Onychomycosis 07/23/2018   Bilateral primary osteoarthritis of knee 03/05/2018   Gout 03/05/2018   Chronic pain of both knees 03/05/2018   Status post laparoscopic sleeve gastrectomy 03/04/2018   Obesity 05/23/2010   LEG CRAMPS 07/17/2009   Constipation 06/21/2009   FATTY LIVER  DISEASE 09/12/2007   Essential hypertension 05/16/2007   ARTHRITIS 05/16/2007   DIVERTICULOSIS, COLON 10/18/2006   HYPERCHOLESTEROLEMIA 09/05/2006   Factor V deficiency (HCC) 09/05/2006    PCP: Tandy Gaw PA-C   REFERRING PROVIDER: Ollen Gross, MD  REFERRING DIAG: M79.18 (ICD-10-CM) - Right buttock pain  THERAPY DIAG:  Pain in right hip  Muscle weakness (generalized)  Unsteadiness on feet  Rationale for Evaluation and Treatment: Rehabilitation  ONSET DATE: 3-4 months ago or maybe more   SUBJECTIVE:   SUBJECTIVE STATEMENT: Patient reports 5/10 pain in Rt glute today. Sore from yesterdays treatment session.   EVAL: This started at least 3-4 months ago, its been awhile. My posterior and my hip are hurting, when I take my first few steps it feels like  the ball of my hip is popping out but I know that's not the case. I have to hold it, once I get moving I can walk a couple of miles. I can only lift it but so far, it feels weak and grabby. No fall or injury that caused all of this. I'm in a Parkinsons study, my dad has parkinsons, I feel like when I first stand up I need a second to get going, I need a little bit to feel like I'm getting going. If I were to just get up and go I would fall, if I wait a second I'm OK.   PERTINENT HISTORY: OA, blood dyscrasia, CPAP dependence, DM, fatty liver, gout, hip pain, HTN, knee pain, obesity, recurrent UTI, roux-en-y bypass, TKA 2021 PAIN:  Are you having pain? Yes: NPRS scale: 5/10 Pain location: R glute and sometimes running up to back just above the waist line  Pain description: weak, grabby Aggravating factors: transitions/starting a movement  Relieving factors: back brace, heat    PRECAUTIONS: None  RED FLAGS: None   WEIGHT BEARING RESTRICTIONS: No  FALLS:  Has patient fallen in last 6 months? No  LIVING ENVIRONMENT: Lives with: lives with their family Lives in: House/apartment Stairs: 1-2 to enter  Has following equipment at home: Single point cane, Environmental consultant - 2 wheeled, and bed side commode  OCCUPATION: retired   PLOF: Independent, Independent with basic ADLs, Independent with gait, and Independent with transfers  PATIENT GOALS: feel steadier when I stand, get pain under control   NEXT MD VISIT: Dr. Despina Hick early-mid September 2024  OBJECTIVE:   MUSCLE LENGTH:  Hip flexors Mod limitation per functional observation L HS/Piriformis mild limitation R HS/Piriformis Moderate limitation     PALPATION: R piriformis tender, R TFL sore as well     LOWER EXTREMITY MMT:  MMT Right eval Left eval  Hip flexion 3- 3-  Hip extension 2 3+  Hip abduction 3- 4  Hip adduction    Hip internal rotation    Hip external rotation    Knee flexion 4- 4+  Knee extension 4 4+  Ankle  dorsiflexion 5 5  Ankle plantarflexion    Ankle inversion    Ankle eversion     (Blank rows = not tested)   FUNCTIONAL TESTS:  5 times sit to stand: 20 seconds U UE on chair  Tandem stance 30 seconds B but shaky/wobbly, SLS RLE 3 seconds, L LE 10 seconds   GAIT: Distance walked: in clinic distances  Assistive device utilized: None Level of assistance: Complete Independence Comments: slow and steady, proximal weakness especially in hip abductors, tends to favor R LE    TODAY'S TREATMENT:    [facility] Adult [facility] Treatment:  DATE: 09/06/22 Therapeutic Exercise: Nustep L6 x 6 minutes Travell piriformis stretch (R) with strap 3x30" Sidelying reverse clam - ball between knees x 15 Sidelying clam - ball between ankles x 20 Rocker board laterally x 1 min tapping, x 1 min static hold Tandem stance on foam 2 x 30 sec bilat Standing on foam 10# KB swing 2 x 30 sec Captain morgan 3 sec hold x 6 Resisted walking laterally 15# x 10 bilat   [facility] Adult PT Treatment:                                                DATE: 09/05/2022 Therapeutic Exercise: NuStep L3 x 8 min (seat 10, LE only) + LEFS intake Mat Table: Seated HS stretch (R propped on stool) Travell piriformis stretch (R) with strap 3x30" Bent knee fall out GTB x10 (B) Supine bridges + ball squeeze 2x10 S/L clamshells (R) x10 --> x5 RTB Prone bent knee hip ext (cramping)  Prone hip extension straight leg x5 (low back discomfort) Standing bent over counter: hip extension (R) 2x10                                                                                                                               PATIENT EDUCATION:  Education details: Update HEP (clamshells starting no resistance) Person educated: Patient Education method: Explanation, Demonstration, and Handouts Education comprehension: verbalized understanding and returned demonstration  HOME EXERCISE PROGRAM: Access Code:  [email or web address] URL: [email or web address] Date: 09/05/2022 Prepared by: Carlynn Herald  Exercises - Beginner Bridge  - 1-2 x daily - 7 x weekly - 1 sets - 10 reps - 1 second  hold - Seated Hamstring Stretch  - 1-2 x daily - 7 x weekly - 1 sets - 3 reps - 30 seconds  hold - Tandem Stance  - 1-2 x daily - 7 x weekly - 1 sets - 3 reps - 30 seconds  hold - Clam with Resistance  - 1 x daily - 7 x weekly - 3 sets - 10 reps - Prone Hip Extension on Table  - 1 x daily - 7 x weekly - 3 sets - 10 reps  ASSESSMENT:  CLINICAL IMPRESSION: Session varied exercises due to soreness from session yesterday. Good tolerance to varied exercises. She did well with balance exercises today and would benefit from continued isometric and eccentric strengthening   OBJECTIVE IMPAIRMENTS: Abnormal gait, decreased activity tolerance, decreased balance, decreased mobility, difficulty walking, decreased strength, increased fascial restrictions, increased muscle spasms, impaired flexibility, obesity, and pain.     GOALS: Goals reviewed with patient? Yes  SHORT TERM GOALS: Target date: 09/21/2022   Will be compliant with appropriate progressive HEP  Baseline: Goal status: INITIAL  2.  Flexibility impairments to have improved by 50% Baseline:  Goal status: INITIAL  3.  R buttock pain to be no more than 2/10 at worst  Baseline:  Goal status: INITIAL    LONG TERM GOALS: Target date: 10/12/2022  MMT to have improved by at least 1 grade in all weak groups  Baseline:  Goal status: INITIAL  2.  Will report resolution of unsteadiness/inability to move well with initial transitions, also will perform 5x STS in 15 seconds or less no UEs  Baseline:  Goal status: INITIAL  3.  Will score at least 50/56 on Berg balance test  Baseline:  Goal status: INITIAL  4.  LEFS score to improve by at least 10 points  Baseline: 50% Goal status: INITIAL   PLAN:  PT FREQUENCY: 2x/week  PT DURATION: 6  weeks  PLANNED INTERVENTIONS: Therapeutic exercises, Therapeutic activity, Neuromuscular re-education, Balance training, Gait training, Patient/Family education, Self Care, Joint mobilization, Stair training, Aquatic Therapy, Dry Needling, Electrical stimulation, Spinal mobilization, Cryotherapy, Moist heat, Taping, Ultrasound, Ionotophoresis 4mg /ml Dexamethasone, Manual therapy, and Re-evaluation  PLAN FOR NEXT SESSION: strength, flexibility, balance training    Reggy Eye, PT,DPT07/31/2410:54 AM

## 2022-09-11 ENCOUNTER — Ambulatory Visit: Payer: PPO | Attending: Orthopedic Surgery

## 2022-09-11 DIAGNOSIS — R2681 Unsteadiness on feet: Secondary | ICD-10-CM | POA: Insufficient documentation

## 2022-09-11 DIAGNOSIS — M6281 Muscle weakness (generalized): Secondary | ICD-10-CM | POA: Insufficient documentation

## 2022-09-11 DIAGNOSIS — M25551 Pain in right hip: Secondary | ICD-10-CM | POA: Insufficient documentation

## 2022-09-11 NOTE — Therapy (Signed)
OUTPATIENT PHYSICAL THERAPY LOWER EXTREMITY TREATMENT   Patient Name: Amanda [name] MRN: [id number] DOB:1955/09/16, 67 y.o., female Today's Date: 09/11/2022  END OF SESSION:  PT End of Session - 09/11/22 1145     Visit Number 4    Number of Visits 13    Date for PT Re-Evaluation 10/12/22    Authorization Type HTA    Authorization Time Period 08/31/22 to 10/12/22    Authorization - Visit Number 4    Progress Note Due on Visit 10    PT Start Time 1148    PT Stop Time 1235    PT Time Calculation (min) 47 min    Activity Tolerance Patient tolerated treatment well    Behavior During Therapy WFL for tasks assessed/performed              Past Medical History:  Diagnosis Date   Allergy    seasonal   Arthritis    all over   Blood dyscrasia    Carrys trait for Leiden Factor five never had any issues . Yhe only reason she was tested was because her mother had it   CPAP (continuous positive airway pressure) dependence    8 cm water w/ heated humidifier   Diabetes mellitus without complication (HCC)    Pre surgery - Gastric sx 2014 Roun-Y no diabetes since  no meds   Fatty liver 4-08   by [place]   Gout    Hip pain    Hypertension    Knee pain    Obesity    Pneumonia    Recurrent UTI    Sleep apnea    had roux en y lost 140lbs no longer needs cpap   Past Surgical History:  Procedure Laterality Date   APPENDECTOMY  1977   CESAREAN SECTION  1989   one   CHOLECYSTECTOMY  1977   DILATION AND CURETTAGE OF UTERUS  1987 & 1988   REDUCTION MAMMAPLASTY Bilateral 1985   ROUX-EN-Y GASTRIC BYPASS  2014   TOTAL KNEE ARTHROPLASTY Right 07/21/2019   Procedure: TOTAL KNEE ARTHROPLASTY;  Surgeon: Ollen Gross, MD;  Location: [facility];  Service: Orthopedics;  Laterality: Right;    Patient Active Problem List   Diagnosis Date Noted   Recurrent UTI 06/26/2022   Family history of Parkinson disease 06/26/2022   Post-menopausal 02/17/2022   Urinary incontinence without sensory  awareness 02/13/2022   Class 1 obesity due to excess calories with serious comorbidity and body mass index (BMI) of 30.0 to 30.9 in adult 07/13/2021   Dyslipidemia, goal LDL below 70 07/13/2021   Type 2 diabetes mellitus with hyperglycemia, without long-term current use of insulin (HCC) 03/18/2021   OAB (overactive bladder) 03/07/2021   Mixed stress and urge urinary incontinence 03/07/2021   Urinary frequency 03/07/2021   Groin pain, right 08/03/2020   Memory changes 07/12/2020   Right hip pain 07/12/2020   Chronic right-sided low back pain without sciatica 07/12/2020   Numbness and tingling of right side of face 07/12/2020   Osteoporosis 07/12/2020   Pap smear abnormality of cervix/human papillomavirus (HPV) positive 07/12/2020   OA (osteoarthritis) of knee 07/21/2019   Hammer toe of left foot 07/23/2018   Epigastric pain 07/23/2018   Onychomycosis 07/23/2018   Bilateral primary osteoarthritis of knee 03/05/2018   Gout 03/05/2018   Chronic pain of both knees 03/05/2018   Status post laparoscopic sleeve gastrectomy 03/04/2018   Obesity 05/23/2010   LEG CRAMPS 07/17/2009   Constipation 06/21/2009   FATTY LIVER  DISEASE 09/12/2007   Essential hypertension 05/16/2007   ARTHRITIS 05/16/2007   DIVERTICULOSIS, COLON 10/18/2006   HYPERCHOLESTEROLEMIA 09/05/2006   Factor V deficiency (HCC) 09/05/2006    PCP: Tandy Gaw PA-C   REFERRING PROVIDER: Ollen Gross, MD  REFERRING DIAG: M79.18 (ICD-10-CM) - Right buttock pain  THERAPY DIAG:  Pain in right hip  Muscle weakness (generalized)  Unsteadiness on feet  Rationale for Evaluation and Treatment: Rehabilitation  ONSET DATE: 3-4 months ago or maybe more   SUBJECTIVE:   SUBJECTIVE STATEMENT: Patient reports 3/10 pain in R glute today. Patient states overall her balance is feeling more steady.  EVAL: This started at least 3-4 months ago, its been awhile. My posterior and my hip are hurting, when I take my first few steps  it feels like the ball of my hip is popping out but I know that's not the case. I have to hold it, once I get moving I can walk a couple of miles. I can only lift it but so far, it feels weak and grabby. No fall or injury that caused all of this. I'm in a Parkinsons study, my dad has parkinsons, I feel like when I first stand up I need a second to get going, I need a little bit to feel like I'm getting going. If I were to just get up and go I would fall, if I wait a second I'm OK.   PERTINENT HISTORY: OA, blood dyscrasia, CPAP dependence, DM, fatty liver, gout, hip pain, HTN, knee pain, obesity, recurrent UTI, roux-en-y bypass, TKA 2021 PAIN:  Are you having pain? Yes: NPRS scale: 3/10 Pain location: R glute and sometimes running up to back just above the waist line  Pain description: weak, grabby Aggravating factors: transitions/starting a movement  Relieving factors: back brace, heat    PRECAUTIONS: None  RED FLAGS: None   WEIGHT BEARING RESTRICTIONS: No  FALLS:  Has patient fallen in last 6 months? No  LIVING ENVIRONMENT: Lives with: lives with their family Lives in: House/apartment Stairs: 1-2 to enter  Has following equipment at home: Single point cane, Environmental consultant - 2 wheeled, and bed side commode  OCCUPATION: retired   PLOF: Independent, Independent with basic ADLs, Independent with gait, and Independent with transfers  PATIENT GOALS: feel steadier when I stand, get pain under control   NEXT MD VISIT: Dr. Despina Hick early-mid September 2024  OBJECTIVE:   MUSCLE LENGTH:  Hip flexors Mod limitation per functional observation L HS/Piriformis mild limitation R HS/Piriformis Moderate limitation    PALPATION: R piriformis tender, R TFL sore as well    LOWER EXTREMITY MMT:  MMT Right eval Left eval  Hip flexion 3- 3-  Hip extension 2 3+  Hip abduction 3- 4  Hip adduction    Hip internal rotation    Hip external rotation    Knee flexion 4- 4+  Knee extension 4 4+   Ankle dorsiflexion 5 5  Ankle plantarflexion    Ankle inversion    Ankle eversion     (Blank rows = not tested)   FUNCTIONAL TESTS:  5 times sit to stand: 20 seconds U UE on chair  Tandem stance 30 seconds B but shaky/wobbly, SLS RLE 3 seconds, L LE 10 seconds   GAIT: Distance walked: in clinic distances  Assistive device utilized: None Level of assistance: Complete Independence Comments: slow and steady, proximal weakness especially in hip abductors, tends to favor R LE    TODAY'S TREATMENT:    [facility] Adult PT  Treatment:                                                DATE: 09/11/2022 Therapeutic Exercise: NuStep L6 x 5 min Travell piriformis stretch (R) with strap 3x30" Sidelying reverse clam 2x10 Sidelying clam RTB 2x10 Supine bridges + ball squeeze 2x10 Seated HS stretch (R propped on stool) Seated --> supine figure 4/glute stretch  Captain morgan 8x3" Side stepping GTB crossed at ankles (counter) Rocker board laterally x1 min tapping --> x1 min static hold Tandem stance on foam 2x30" (B) Standing on foam 10# KB swing 2x30" Eccentric glute bridge slide    [facility] Adult PT Treatment:                                                DATE: 09/06/22 Therapeutic Exercise: Nustep L6 x 6 minutes Travell piriformis stretch (R) with strap 3x30" Sidelying reverse clam - ball between knees x 15 Rocker board laterally x 1 min tapping, x 1 min static hold Tandem stance on foam 2 x 30 sec bilat Standing on foam 10# KB swing 2 x 30 sec Captain morgan 6x3" Resisted walking laterally 15# x 10 (B)   [facility] Adult PT Treatment:                                                DATE: 09/05/2022 Therapeutic Exercise: NuStep L3 x 8 min (seat 10, LE only) + LEFS intake Mat Table: Seated HS stretch (R propped on stool) Travell piriformis stretch (R) with strap 3x30" Bent knee fall out GTB x10 (B) Supine bridges + ball squeeze 2x10 S/L clamshells (R) x10 --> x5 RTB Prone bent knee hip ext  (cramping)  Prone hip extension straight leg x5 (low back discomfort) Standing bent over counter: hip extension (R) 2x10                                                                                                         PATIENT EDUCATION:  Education details: Update HEP (clamshells starting no resistance) Person educated: Patient Education method: Explanation, Demonstration, and Handouts Education comprehension: verbalized understanding and returned demonstration  HOME EXERCISE PROGRAM: Access Code: [email or web address] URL: [email or web address] Date: 09/05/2022 Prepared by: Carlynn Herald  Exercises - Beginner Bridge  - 1-2 x daily - 7 x weekly - 1 sets - 10 reps - 1 second  hold - Seated Hamstring Stretch  - 1-2 x daily - 7 x weekly - 1 sets - 3 reps - 30 seconds  hold - Tandem Stance  - 1-2 x daily - 7 x weekly - 1 sets - 3 reps - 30 seconds  hold - Clam  with Resistance  - 1 x daily - 7 x weekly - 3 sets - 10 reps - Prone Hip Extension on Table  - 1 x daily - 7 x weekly - 3 sets - 10 reps  ASSESSMENT:  CLINICAL IMPRESSION: Eccentric glute bridge attempted, however patient did not feel a lot of activation; noted decreased hip add activation to maintain hip stability and postural alignment. Functional hip strengthening in standing continued, as well as balance exercises to challenge core and postural stability.    OBJECTIVE IMPAIRMENTS: Abnormal gait, decreased activity tolerance, decreased balance, decreased mobility, difficulty walking, decreased strength, increased fascial restrictions, increased muscle spasms, impaired flexibility, obesity, and pain.     GOALS: Goals reviewed with patient? Yes  SHORT TERM GOALS: Target date: 09/21/2022   Will be compliant with appropriate progressive HEP  Baseline: Goal status: INITIAL  2.  Flexibility impairments to have improved by 50% Baseline:  Goal status: INITIAL  3.  R buttock pain to be no more than 2/10 at worst   Baseline:  Goal status: INITIAL    LONG TERM GOALS: Target date: 10/12/2022  MMT to have improved by at least 1 grade in all weak groups  Baseline:  Goal status: INITIAL  2.  Will report resolution of unsteadiness/inability to move well with initial transitions, also will perform 5x STS in 15 seconds or less no UEs  Baseline:  Goal status: INITIAL  3.  Will score at least 50/56 on Berg balance test  Baseline:  Goal status: INITIAL  4.  LEFS score to improve by at least 10 points  Baseline: 50% Goal status: INITIAL   PLAN:  PT FREQUENCY: 2x/week  PT DURATION: 6 weeks  PLANNED INTERVENTIONS: Therapeutic exercises, Therapeutic activity, Neuromuscular re-education, Balance training, Gait training, Patient/Family education, Self Care, Joint mobilization, Stair training, Aquatic Therapy, Dry Needling, Electrical stimulation, Spinal mobilization, Cryotherapy, Moist heat, Taping, Ultrasound, Ionotophoresis 4mg /ml Dexamethasone, Manual therapy, and Re-evaluation  PLAN FOR NEXT SESSION: strength, flexibility, balance training   Carlynn Herald, PTA 09/11/2022 12:42 PM

## 2022-09-12 ENCOUNTER — Other Ambulatory Visit: Payer: Self-pay

## 2022-09-12 ENCOUNTER — Other Ambulatory Visit (HOSPITAL_COMMUNITY): Payer: Self-pay

## 2022-09-14 ENCOUNTER — Other Ambulatory Visit (HOSPITAL_COMMUNITY): Payer: Self-pay

## 2022-09-15 ENCOUNTER — Ambulatory Visit
Admission: EM | Admit: 2022-09-15 | Discharge: 2022-09-15 | Disposition: A | Discharge status: Home / Self Care | Payer: PPO

## 2022-09-15 ENCOUNTER — Ambulatory Visit (INDEPENDENT_AMBULATORY_CARE_PROVIDER_SITE_OTHER): Payer: PPO

## 2022-09-15 ENCOUNTER — Other Ambulatory Visit (HOSPITAL_COMMUNITY): Payer: Self-pay

## 2022-09-15 ENCOUNTER — Other Ambulatory Visit: Payer: Self-pay

## 2022-09-15 DIAGNOSIS — R0781 Pleurodynia: Secondary | ICD-10-CM

## 2022-09-15 DIAGNOSIS — W19XXXA Unspecified fall, initial encounter: Secondary | ICD-10-CM | POA: Diagnosis not present

## 2022-09-15 DIAGNOSIS — S8992XA Unspecified injury of left lower leg, initial encounter: Secondary | ICD-10-CM | POA: Diagnosis not present

## 2022-09-15 DIAGNOSIS — R0782 Intercostal pain: Secondary | ICD-10-CM

## 2022-09-15 MED ORDER — OXYCODONE-ACETAMINOPHEN 5-325 MG PO TABS
1.0000 | ORAL_TABLET | Freq: Three times a day (TID) | ORAL | 0 refills | Status: AC | PRN
  Filled 2022-09-15 (×2): qty 21, 7d supply, fill #0

## 2022-09-15 NOTE — ED Triage Notes (Signed)
Pt reports pain  left knee and in left sided rib cage x 2 days after she fell 2 days ago when she was walking and stumbled and she on her kitchen floor. States rib cage pain is getting worse. Rib cage pain increase with cough and movement. Tylenol, Robaxin and Gabapentin gives some relief.

## 2022-09-15 NOTE — Discharge Instructions (Addendum)
Advised patient of chest x-ray results with hardcopy/images provided.  Advised patient may take Percocet every 8 hours, as needed for breakthrough left rib pain.  Advised of sedative effects of this medication.  Encouraged to increase daily water intake to 64 ounces per day while taking this medication.  Advised if symptoms worsen and/or unresolved please follow-up with PCP or here for further evaluation.

## 2022-09-15 NOTE — ED Provider Notes (Signed)
[facility]    CSN: [id number] Arrival date & time: 09/15/22  1229      History   Chief Complaint Chief Complaint  Patient presents with   Fall    HPI Amanda [name] is a 67 y.o. female.   HPI 68 year old female presents with left knee and left sided rib cage pain for 2 days after fall at her home.  PMH significant for morbid obesity, T2DM without complication, and sleep apnea.  Patient reports more pain with movement with regard to rib cage and has tried Tylenol, Robaxin and gabapentin with some relief.  Past Medical History:  Diagnosis Date   Allergy    seasonal   Arthritis    all over   Blood dyscrasia    Carrys trait for Leiden Factor five never had any issues . Yhe only reason she was tested was because her mother had it   CPAP (continuous positive airway pressure) dependence    8 cm water w/ heated humidifier   Diabetes mellitus without complication (HCC)    Pre surgery - Gastric sx 2014 Roun-Y no diabetes since  no meds   Fatty liver 4-08   by [place]   Gout    Hip pain    Hypertension    Knee pain    Obesity    Pneumonia    Recurrent UTI    Sleep apnea    had roux en y lost 140lbs no longer needs cpap    Patient Active Problem List   Diagnosis Date Noted   Recurrent UTI 06/26/2022   Family history of Parkinson disease 06/26/2022   Post-menopausal 02/17/2022   Urinary incontinence without sensory awareness 02/13/2022   Class 1 obesity due to excess calories with serious comorbidity and body mass index (BMI) of 30.0 to 30.9 in adult 07/13/2021   Dyslipidemia, goal LDL below 70 07/13/2021   Type 2 diabetes mellitus with hyperglycemia, without long-term current use of insulin (HCC) 03/18/2021   OAB (overactive bladder) 03/07/2021   Mixed stress and urge urinary incontinence 03/07/2021   Urinary frequency 03/07/2021   Groin pain, right 08/03/2020   Memory changes 07/12/2020   Right hip pain 07/12/2020   Chronic right-sided low back pain without  sciatica 07/12/2020   Numbness and tingling of right side of face 07/12/2020   Osteoporosis 07/12/2020   Pap smear abnormality of cervix/human papillomavirus (HPV) positive 07/12/2020   OA (osteoarthritis) of knee 07/21/2019   Hammer toe of left foot 07/23/2018   Epigastric pain 07/23/2018   Onychomycosis 07/23/2018   Bilateral primary osteoarthritis of knee 03/05/2018   Gout 03/05/2018   Chronic pain of both knees 03/05/2018   Status post laparoscopic sleeve gastrectomy 03/04/2018   Obesity 05/23/2010   LEG CRAMPS 07/17/2009   Constipation 06/21/2009   FATTY LIVER DISEASE 09/12/2007   Essential hypertension 05/16/2007   ARTHRITIS 05/16/2007   DIVERTICULOSIS, COLON 10/18/2006   HYPERCHOLESTEROLEMIA 09/05/2006   Factor V deficiency (HCC) 09/05/2006    Past Surgical History:  Procedure Laterality Date   APPENDECTOMY  1977   CESAREAN SECTION  1989   one   CHOLECYSTECTOMY  1977   DILATION AND CURETTAGE OF UTERUS  1987 & 1988   REDUCTION MAMMAPLASTY Bilateral 1985   ROUX-EN-Y GASTRIC BYPASS  2014   TOTAL KNEE ARTHROPLASTY Right 07/21/2019   Procedure: TOTAL KNEE ARTHROPLASTY;  Surgeon: Ollen Gross, MD;  Location: [facility];  Service: Orthopedics;  Laterality: Right;     OB History   No obstetric history  on file.      Home Medications    Prior to Admission medications   Medication Sig Start Date End Date Taking? Authorizing Provider  acetaminophen (TYLENOL) 500 MG tablet Take 500 mg by mouth every 6 (six) hours as needed.   Yes [provider]  Biotin (VB7 MAX) 100 MG/GM POWD Take by mouth. 08/02/16  Yes [provider]  oxyCODONE-acetaminophen (PERCOCET/ROXICET) 5-325 MG tablet Take 1 tablet by mouth every 8 (eight) hours as needed for up to 7 days for severe pain. 09/15/22 09/22/22 Yes Trevor Iha, FNP  Accu-Chek Softclix Lancets lancets Use up to four times daily as directed 07/08/21     alendronate (FOSAMAX) 70 MG tablet Take 1 tablet (70 mg total)  by mouth every 7 (seven) days. 08/14/22   Breeback, Lonna Cobb, PA-C  allopurinol (ZYLOPRIM) 100 MG tablet Take 1 tablet (100 mg total) by mouth daily. Needs appt 02/17/22   Jomarie Longs, PA-C  atorvastatin (LIPITOR) 40 MG tablet Take 1 tablet (40 mg total) by mouth daily. 07/04/22   Tandy Gaw L, PA-C  blood glucose meter kit and supplies Use up to four times daily as directed 07/08/21   Breeback, Jade L, PA-C  calcium citrate (CALCITRATE - DOSED IN MG ELEMENTAL CALCIUM) 950 (200 Ca) MG tablet     [provider]  estradiol (ESTRACE VAGINAL) 0.1 MG/GM vaginal cream Apply pea-sized amount over urethral opening daily. 06/26/22   Breeback, Jade L, PA-C  ferrous sulfate 324 MG TBEC Take 324 mg by mouth.    [provider]  fexofenadine (ALLEGRA) 180 MG tablet 180 mg by oral route.    [provider]  fluticasone (FLONASE) 50 MCG/ACT nasal spray     [provider]  gabapentin (NEURONTIN) 100 MG capsule Take 1-3 capsules (100-300 mg total) by mouth 3 (three) times daily, for pain. 07/04/22   Breeback, Jade L, PA-C  glucose blood (ACCU-CHEK GUIDE) test strip Use up to four times daily as directed 07/08/21     ibuprofen (ADVIL) 800 MG tablet Take 800 mg by mouth every 8 (eight) hours as needed.    [provider]  losartan (COZAAR) 25 MG tablet Take 1 tablet (25 mg total) by mouth daily. 07/04/22   Breeback, Lonna Cobb, PA-C  Multiple Vitamin (MULTI-VITAMIN) tablet Take 1 tablet by mouth daily.    [provider]  nitrofurantoin (MACRODANTIN) 50 MG capsule 1 capsule by mouth daily. 05/26/22     omeprazole (PRILOSEC) 20 MG capsule Take 1 capsule (20 mg total) by mouth daily. 02/17/22   Breeback, Lonna Cobb, PA-C  spironolactone (ALDACTONE) 25 MG tablet Take 1 tablet (25 mg total) by mouth daily. 07/04/22   Breeback, Lonna Cobb, PA-C  trimethoprim (TRIMPEX) 100 MG tablet Take 1 tablet (100 mg total) by mouth daily. Stop cephalexin and take this in its place. 04/14/22      Vibegron (GEMTESA) 75 MG TABS Take 1 tablet (75 mg total) by mouth daily. 07/11/22     mirabegron ER (MYRBETRIQ) 50 MG TB24 tablet 1 tablet PO QD 07/11/22 07/11/22      Family History Family History  Problem Relation Age of Onset   Breast cancer Mother    Diabetes Mother    Hypertension Mother    Cancer Mother        breast- partial mastectomy- in her 99's   Heart attack Father    Hypertension Father    Arthritis Father    Gout Father    Parkinsonism  Father    Parkinson's disease Father    Alcohol abuse Maternal Grandfather    Colon cancer Neg Hx    Esophageal cancer Neg Hx    Stomach cancer Neg Hx    Rectal cancer Neg Hx     Social History Social History   Tobacco Use   Smoking status: Former    Current packs/day: 0.00    Types: Cigarettes    Start date: 14    Quit date: 2001    Years since quitting: 23.6   Smokeless tobacco: Never  Vaping Use   Vaping status: Never Used  Substance Use Topics   Alcohol use: Yes    Comment: occasional  once a month   Drug use: No     Allergies   Fenofibrate, Morphine, Penicillins, Sulfonamide derivatives, Mounjaro [tirzepatide], and Ozempic (0.25 or 0.5 mg-dose) [semaglutide(0.25 or 0.5mg -dos)]   Review of Systems Review of Systems  Musculoskeletal:        Left sided rib pain, left knee pain     Physical Exam Triage Vital Signs ED Triage Vitals  Encounter Vitals Group     BP 09/15/22 1335 (!) 141/91     Systolic BP Percentile --      Diastolic BP Percentile --      Pulse Rate 09/15/22 1335 68     Resp 09/15/22 1335 18     Temp 09/15/22 1335 98.1 F (36.7 C)     Temp Source 09/15/22 1335 Oral     SpO2 09/15/22 1335 95 %     Weight --      Height --      Head Circumference --      Peak Flow --      Pain Score 09/15/22 1342 8     Pain Loc --      Pain Education --      Exclude from Growth Chart --    No data found.  Updated Vital Signs BP (!) 141/91 (BP Location: Right Arm)   Pulse 68   Temp 98.1 F (36.7  C) (Oral)   Resp 18   SpO2 95%    Physical Exam Vitals and nursing note reviewed.  Constitutional:      Appearance: Normal appearance. She is obese. She is not ill-appearing.  HENT:     Head: Normocephalic and atraumatic.     Mouth/Throat:     Mouth: Mucous membranes are moist.     Pharynx: Oropharynx is clear.  Eyes:     Extraocular Movements: Extraocular movements intact.     Conjunctiva/sclera: Conjunctivae normal.     Pupils: Pupils are equal, round, and reactive to light.  Cardiovascular:     Rate and Rhythm: Normal rate and regular rhythm.     Pulses: Normal pulses.     Heart sounds: Normal heart sounds.  Pulmonary:     Effort: Pulmonary effort is normal.     Breath sounds: Normal breath sounds. No wheezing, rhonchi or rales.  Musculoskeletal:        General: Normal range of motion.     Cervical back: Normal range of motion.     Comments: Left rib cage (anterior/lateral aspects) TTP over 7th-9th ribs. Left knee-mildly TTP inferior medial patella no soft tissue swelling noted no pain elicited with weightbearing, flexion/extension  Skin:    General: Skin is warm and dry.  Neurological:     General: No focal deficit present.     Mental Status: She is alert and oriented to person, place,  and time. Mental status is at baseline.  Psychiatric:        Mood and Affect: Mood normal.        Behavior: Behavior normal.        Thought Content: Thought content normal.      UC Treatments / Results  Labs (all labs ordered are listed, but only abnormal results are displayed) Labs Reviewed - No data to display  EKG   Radiology DG Ribs Unilateral W/Chest Left  Result Date: 09/15/2022 CLINICAL DATA:  Left rib pain after fall 2 days ago. EXAM: LEFT RIBS AND CHEST - 3+ VIEW COMPARISON:  July 17, 2022. FINDINGS: No fracture or other bone lesions are seen involving the ribs. There is no evidence of pneumothorax or pleural effusion. Both lungs are clear. Heart size and mediastinal  contours are within normal limits. IMPRESSION: Negative. Electronically Signed   By: Lupita Raider M.D.   On: 09/15/2022 15:09    Procedures Procedures (including critical care time)  Medications Ordered in UC Medications - No data to display  Initial Impression / Assessment and Plan / UC Course  I have reviewed the triage vital signs and the nursing notes.  Pertinent labs & imaging results that were available during my care of the patient were reviewed by me and considered in my medical decision making (see chart for details).     MDM: 1.  Rib pain on left side-left rib x-ray revealed above, Rx'd Percocet 5/325 mg tablet: Take 1 tablet every 8 hours, as needed for rib pain; 2.  Left knee injury, initial encounter-Rx'd Percocet 5/325 mg tablet: Take 1 tablet every 8 hours as needed for left knee pain. Advised patient of chest x-ray results with hardcopy/images provided.  3.  Fall, initial encounter-patient describes falls 2 days ago at her home while lifting a wet plastic bag full of magazines and old sewing machine at the same time.  Advised patient may take Percocet every 8 hours, as needed for breakthrough left rib pain.  Advised of sedative effects of this medication.  Encouraged to increase daily water intake to 64 ounces per day while taking this medication.  Advised if symptoms worsen and/or unresolved please follow-up with PCP or here for further evaluation.  Patient discharged home, hemodynamically stable. Final Clinical Impressions(s) / UC Diagnoses   Final diagnoses:  Rib pain on left side  Left knee injury, initial encounter  Fall, initial encounter     Discharge Instructions      Advised patient of chest x-ray results with hardcopy/images provided.  Advised patient may take Percocet every 8 hours, as needed for breakthrough left rib pain.  Advised of sedative effects of this medication.  Encouraged to increase daily water intake to 64 ounces per day while taking this  medication.  Advised if symptoms worsen and/or unresolved please follow-up with PCP or here for further evaluation.     ED Prescriptions     Medication Sig Dispense Auth. Provider   oxyCODONE-acetaminophen (PERCOCET/ROXICET) 5-325 MG tablet Take 1 tablet by mouth every 8 (eight) hours as needed for up to 7 days for severe pain. 21 tablet Trevor Iha, FNP      I have reviewed the PDMP during this encounter.   Trevor Iha, FNP 09/15/22 1538

## 2022-09-19 ENCOUNTER — Ambulatory Visit: Payer: PPO | Admitting: Physical Therapy

## 2022-09-21 DIAGNOSIS — N3946 Mixed incontinence: Secondary | ICD-10-CM | POA: Diagnosis not present

## 2022-09-21 DIAGNOSIS — R350 Frequency of micturition: Secondary | ICD-10-CM | POA: Diagnosis not present

## 2022-09-22 ENCOUNTER — Ambulatory Visit: Payer: PPO | Admitting: Physical Therapy

## 2022-09-27 ENCOUNTER — Other Ambulatory Visit: Payer: Self-pay | Admitting: Physician Assistant

## 2022-09-30 ENCOUNTER — Other Ambulatory Visit (HOSPITAL_COMMUNITY): Payer: Self-pay

## 2022-10-02 ENCOUNTER — Ambulatory Visit: Payer: PPO

## 2022-10-02 DIAGNOSIS — M6281 Muscle weakness (generalized): Secondary | ICD-10-CM

## 2022-10-02 DIAGNOSIS — R2681 Unsteadiness on feet: Secondary | ICD-10-CM

## 2022-10-02 DIAGNOSIS — M25551 Pain in right hip: Secondary | ICD-10-CM

## 2022-10-02 NOTE — Therapy (Signed)
OUTPATIENT PHYSICAL THERAPY LOWER EXTREMITY TREATMENT   Patient Name: Amanda [name] MRN: [id number] DOB:01-22-1956, 67 y.o., female Today's Date: 10/02/2022  END OF SESSION:  PT End of Session - 10/02/22 0933     Visit Number 5    Number of Visits 13    Date for PT Re-Evaluation 10/12/22    Authorization Type HTA    Authorization Time Period 08/31/22 to 10/12/22    Authorization - Visit Number 5    Progress Note Due on Visit 10    PT Start Time 0934    PT Stop Time 1015    PT Time Calculation (min) 41 min    Activity Tolerance Patient tolerated treatment well    Behavior During Therapy [facility] for tasks assessed/performed              Past Medical History:  Diagnosis Date   Allergy    seasonal   Arthritis    all over   Blood dyscrasia    Carrys trait for Leiden Factor five never had any issues . Yhe only reason she was tested was because her mother had it   CPAP (continuous positive airway pressure) dependence    8 cm water w/ heated humidifier   Diabetes mellitus without complication (HCC)    Pre surgery - Gastric sx 2014 Roun-Y no diabetes since  no meds   Fatty liver 4-08   by [place]   Gout    Hip pain    Hypertension    Knee pain    Obesity    Pneumonia    Recurrent UTI    Sleep apnea    had roux en y lost 140lbs no longer needs cpap   Past Surgical History:  Procedure Laterality Date   APPENDECTOMY  1977   CESAREAN SECTION  1989   one   CHOLECYSTECTOMY  1977   DILATION AND CURETTAGE OF UTERUS  1987 & 1988   REDUCTION MAMMAPLASTY Bilateral 1985   ROUX-EN-Y GASTRIC BYPASS  2014   TOTAL KNEE ARTHROPLASTY Right 07/21/2019   Procedure: TOTAL KNEE ARTHROPLASTY;  Surgeon: Ollen Gross, MD;  Location: [facility];  Service: Orthopedics;  Laterality: Right;    Patient Active Problem List   Diagnosis Date Noted   Recurrent UTI 06/26/2022   Family history of Parkinson disease 06/26/2022   Post-menopausal 02/17/2022   Urinary incontinence without sensory  awareness 02/13/2022   Class 1 obesity due to excess calories with serious comorbidity and body mass index (BMI) of 30.0 to 30.9 in adult 07/13/2021   Dyslipidemia, goal LDL below 70 07/13/2021   Type 2 diabetes mellitus with hyperglycemia, without long-term current use of insulin (HCC) 03/18/2021   OAB (overactive bladder) 03/07/2021   Mixed stress and urge urinary incontinence 03/07/2021   Urinary frequency 03/07/2021   Groin pain, right 08/03/2020   Memory changes 07/12/2020   Right hip pain 07/12/2020   Chronic right-sided low back pain without sciatica 07/12/2020   Numbness and tingling of right side of face 07/12/2020   Osteoporosis 07/12/2020   Pap smear abnormality of cervix/human papillomavirus (HPV) positive 07/12/2020   OA (osteoarthritis) of knee 07/21/2019   Hammer toe of left foot 07/23/2018   Epigastric pain 07/23/2018   Onychomycosis 07/23/2018   Bilateral primary osteoarthritis of knee 03/05/2018   Gout 03/05/2018   Chronic pain of both knees 03/05/2018   Status post laparoscopic sleeve gastrectomy 03/04/2018   Obesity 05/23/2010   LEG CRAMPS 07/17/2009   Constipation 06/21/2009   FATTY LIVER  DISEASE 09/12/2007   Essential hypertension 05/16/2007   ARTHRITIS 05/16/2007   DIVERTICULOSIS, COLON 10/18/2006   HYPERCHOLESTEROLEMIA 09/05/2006   Factor V deficiency (HCC) 09/05/2006    PCP: Tandy Gaw PA-C   REFERRING PROVIDER: Ollen Gross, MD  REFERRING DIAG: M79.18 (ICD-10-CM) - Right buttock pain  THERAPY DIAG:  Pain in right hip  Muscle weakness (generalized)  Unsteadiness on feet  Rationale for Evaluation and Treatment: Rehabilitation  ONSET DATE: 3-4 months ago or maybe more   SUBJECTIVE:   SUBJECTIVE STATEMENT: Patient reports she fell twice since last PT visit; states the first fall happened when she was carrying heavy objects and lost her balance when leaning forward to put objects down. Second fall happened in her bedroom when she fell  backwards and pulled the closet door down with her. Patient states her R glute is feeling much better but now has L hip/glute pain and L ribcage pain, states she went to ER and was told she had no broken bones or fractures.  EVAL: This started at least 3-4 months ago, its been awhile. My posterior and my hip are hurting, when I take my first few steps it feels like the ball of my hip is popping out but I know that's not the case. I have to hold it, once I get moving I can walk a couple of miles. I can only lift it but so far, it feels weak and grabby. No fall or injury that caused all of this. I'm in a Parkinsons study, my dad has parkinsons, I feel like when I first stand up I need a second to get going, I need a little bit to feel like I'm getting going. If I were to just get up and go I would fall, if I wait a second I'm OK.   PERTINENT HISTORY: OA, blood dyscrasia, CPAP dependence, DM, fatty liver, gout, hip pain, HTN, knee pain, obesity, recurrent UTI, roux-en-y bypass, TKA 2021 PAIN:  Are you having pain? Yes: NPRS scale: 3/10 Pain location: R glute and sometimes running up to back just above the waist line  Pain description: weak, grabby Aggravating factors: transitions/starting a movement  Relieving factors: back brace, heat    PRECAUTIONS: None  RED FLAGS: None   WEIGHT BEARING RESTRICTIONS: No  FALLS:  Has patient fallen in last 6 months? No  LIVING ENVIRONMENT: Lives with: lives with their family Lives in: House/apartment Stairs: 1-2 to enter  Has following equipment at home: Single point cane, Environmental consultant - 2 wheeled, and bed side commode  OCCUPATION: retired   PLOF: Independent, Independent with basic ADLs, Independent with gait, and Independent with transfers  PATIENT GOALS: feel steadier when I stand, get pain under control   NEXT MD VISIT: Dr. Despina Hick early-mid September 2024  OBJECTIVE:   MUSCLE LENGTH:  Hip flexors Mod limitation per functional observation L  HS/Piriformis mild limitation R HS/Piriformis Moderate limitation    PALPATION: R piriformis tender, R TFL sore as well    LOWER EXTREMITY MMT:  MMT Right eval Left eval  Hip flexion 3- 3-  Hip extension 2 3+  Hip abduction 3- 4  Hip adduction    Hip internal rotation    Hip external rotation    Knee flexion 4- 4+  Knee extension 4 4+  Ankle dorsiflexion 5 5  Ankle plantarflexion    Ankle inversion    Ankle eversion     (Blank rows = not tested)   FUNCTIONAL TESTS:  5 times sit to stand:  20 seconds U UE on chair  Tandem stance 30 seconds B but shaky/wobbly, SLS RLE 3 seconds, L LE 10 seconds   GAIT: Distance walked: in clinic distances  Assistive device utilized: None Level of assistance: Complete Independence Comments: slow and steady, proximal weakness especially in hip abductors, tends to favor R LE    TODAY'S TREATMENT:    [facility] Adult PT Treatment:                                                DATE: 10/02/2022 Therapeutic Exercise: Seated green PB roll out front x10 --> alt diagonals x5 Supine (pelvis offloaded with folded towel & hand placement): Small range LTR Hooklying hip add iso ball squeeze x10 Seated: Figure 4 piriformis stretch  Modalities: TENS L glute/piriformis + moist heat L glute & L ribs x 15 min    [facility] Adult PT Treatment:                                                DATE: 09/11/2022 Therapeutic Exercise: NuStep L6 x 5 min Travell piriformis stretch (R) with strap 3x30" Sidelying reverse clam 2x10 Sidelying clam RTB 2x10 Supine bridges + ball squeeze 2x10 Seated HS stretch (R propped on stool) Seated --> supine figure 4/glute stretch  Captain morgan 8x3" Side stepping GTB crossed at ankles (counter) Rocker board laterally x1 min tapping --> x1 min static hold Tandem stance on foam 2x30" (B) Standing on foam 10# KB swing 2x30" Eccentric glute bridge slide    [facility] Adult PT Treatment:                                                 DATE: 09/06/22 Therapeutic Exercise: Nustep L6 x 6 minutes Travell piriformis stretch (R) with strap 3x30" Sidelying reverse clam - ball between knees x 15 Rocker board laterally x 1 min tapping, x 1 min static hold Tandem stance on foam 2 x 30 sec bilat Standing on foam 10# KB swing 2 x 30 sec Captain morgan 6x3" Resisted walking laterally 15# x 10 (B)   PATIENT EDUCATION:  Education details: Update HEP (clamshells starting no resistance) Person educated: Patient Education method: Explanation, Demonstration, and Handouts Education comprehension: verbalized understanding and returned demonstration  HOME EXERCISE PROGRAM: Access Code: [email or web address] URL: [email or web address] Date: 09/05/2022 Prepared by: Carlynn Herald  Exercises - Beginner Bridge  - 1-2 x daily - 7 x weekly - 1 sets - 10 reps - 1 second  hold - Seated Hamstring Stretch  - 1-2 x daily - 7 x weekly - 1 sets - 3 reps - 30 seconds  hold - Tandem Stance  - 1-2 x daily - 7 x weekly - 1 sets - 3 reps - 30 seconds  hold - Clam with Resistance  - 1 x daily - 7 x weekly - 3 sets - 10 reps - Prone Hip Extension on Table  - 1 x daily - 7 x weekly - 3 sets - 10 reps  ASSESSMENT:  CLINICAL IMPRESSION: Gentle stretching and exercises performed as tolerated by patient due to previous falls  since last PT visit. Patient unable to tolerate supine position for longer than approx 5 min due to L glute pain.    OBJECTIVE IMPAIRMENTS: Abnormal gait, decreased activity tolerance, decreased balance, decreased mobility, difficulty walking, decreased strength, increased fascial restrictions, increased muscle spasms, impaired flexibility, obesity, and pain.     GOALS: Goals reviewed with patient? Yes  SHORT TERM GOALS: Target date: 09/21/2022   Will be compliant with appropriate progressive HEP  Baseline: Goal status: MET  2.  Flexibility impairments to have improved by 50% Baseline: 30% improvement Goal status: IN  PROGRESS  3.  R buttock pain to be no more than 2/10 at worst  Baseline: 2/10 Goal status: MET    LONG TERM GOALS: Target date: 10/12/2022  MMT to have improved by at least 1 grade in all weak groups  Baseline:  Goal status: INITIAL  2.  Will report resolution of unsteadiness/inability to move well with initial transitions, also will perform 5x STS in 15 seconds or less no UEs  Baseline:  Goal status: INITIAL  3.  Will score at least 50/56 on Berg balance test  Baseline:  Goal status: INITIAL  4.  LEFS score to improve by at least 10 points  Baseline: 50% Goal status: INITIAL   PLAN:  PT FREQUENCY: 2x/week  PT DURATION: 6 weeks  PLANNED INTERVENTIONS: Therapeutic exercises, Therapeutic activity, Neuromuscular re-education, Balance training, Gait training, Patient/Family education, Self Care, Joint mobilization, Stair training, Aquatic Therapy, Dry Needling, Electrical stimulation, Spinal mobilization, Cryotherapy, Moist heat, Taping, Ultrasound, Ionotophoresis 4mg /ml Dexamethasone, Manual therapy, and Re-evaluation  PLAN FOR NEXT SESSION: Follow-up on L glute /ribcage pain from falls. Continue strength, flexibility, balance training   Carlynn Herald, PTA 10/02/2022 10:17 AM

## 2022-10-03 ENCOUNTER — Encounter: Payer: Self-pay | Admitting: Physical Therapy

## 2022-10-03 ENCOUNTER — Ambulatory Visit: Payer: PPO | Admitting: Physical Therapy

## 2022-10-03 DIAGNOSIS — M25551 Pain in right hip: Secondary | ICD-10-CM | POA: Diagnosis not present

## 2022-10-03 DIAGNOSIS — R2681 Unsteadiness on feet: Secondary | ICD-10-CM

## 2022-10-03 DIAGNOSIS — M6281 Muscle weakness (generalized): Secondary | ICD-10-CM

## 2022-10-03 NOTE — Therapy (Signed)
OUTPATIENT PHYSICAL THERAPY LOWER EXTREMITY TREATMENT   Patient Name: Amanda [name] MRN: [id number] DOB:12-Jun-1955, 67 y.o., female Today's Date: 10/03/2022  END OF SESSION:  PT End of Session - 10/03/22 1000     Visit Number 6    Number of Visits 13    Date for PT Re-Evaluation 10/12/22    Authorization Type HTA    Authorization Time Period 08/31/22 to 10/12/22    Authorization - Visit Number 6    Progress Note Due on Visit 10    PT Start Time 0930    PT Stop Time 1008    PT Time Calculation (min) 38 min    Activity Tolerance Patient tolerated treatment well    Behavior During Therapy WFL for tasks assessed/performed               Past Medical History:  Diagnosis Date   Allergy    seasonal   Arthritis    all over   Blood dyscrasia    Carrys trait for Leiden Factor five never had any issues . Yhe only reason she was tested was because her mother had it   CPAP (continuous positive airway pressure) dependence    8 cm water w/ heated humidifier   Diabetes mellitus without complication (HCC)    Pre surgery - Gastric sx 2014 Roun-Y no diabetes since  no meds   Fatty liver 4-08   by [place]   Gout    Hip pain    Hypertension    Knee pain    Obesity    Pneumonia    Recurrent UTI    Sleep apnea    had roux en y lost 140lbs no longer needs cpap   Past Surgical History:  Procedure Laterality Date   APPENDECTOMY  1977   CESAREAN SECTION  1989   one   CHOLECYSTECTOMY  1977   DILATION AND CURETTAGE OF UTERUS  1987 & 1988   REDUCTION MAMMAPLASTY Bilateral 1985   ROUX-EN-Y GASTRIC BYPASS  2014   TOTAL KNEE ARTHROPLASTY Right 07/21/2019   Procedure: TOTAL KNEE ARTHROPLASTY;  Surgeon: Ollen Gross, MD;  Location: [facility];  Service: Orthopedics;  Laterality: Right;    Patient Active Problem List   Diagnosis Date Noted   Recurrent UTI 06/26/2022   Family history of Parkinson disease 06/26/2022   Post-menopausal 02/17/2022   Urinary incontinence without  sensory awareness 02/13/2022   Class 1 obesity due to excess calories with serious comorbidity and body mass index (BMI) of 30.0 to 30.9 in adult 07/13/2021   Dyslipidemia, goal LDL below 70 07/13/2021   Type 2 diabetes mellitus with hyperglycemia, without long-term current use of insulin (HCC) 03/18/2021   OAB (overactive bladder) 03/07/2021   Mixed stress and urge urinary incontinence 03/07/2021   Urinary frequency 03/07/2021   Groin pain, right 08/03/2020   Memory changes 07/12/2020   Right hip pain 07/12/2020   Chronic right-sided low back pain without sciatica 07/12/2020   Numbness and tingling of right side of face 07/12/2020   Osteoporosis 07/12/2020   Pap smear abnormality of cervix/human papillomavirus (HPV) positive 07/12/2020   OA (osteoarthritis) of knee 07/21/2019   Hammer toe of left foot 07/23/2018   Epigastric pain 07/23/2018   Onychomycosis 07/23/2018   Bilateral primary osteoarthritis of knee 03/05/2018   Gout 03/05/2018   Chronic pain of both knees 03/05/2018   Status post laparoscopic sleeve gastrectomy 03/04/2018   Obesity 05/23/2010   LEG CRAMPS 07/17/2009   Constipation 06/21/2009   FATTY  LIVER DISEASE 09/12/2007   Essential hypertension 05/16/2007   ARTHRITIS 05/16/2007   DIVERTICULOSIS, COLON 10/18/2006   HYPERCHOLESTEROLEMIA 09/05/2006   Factor V deficiency (HCC) 09/05/2006    PCP: Tandy Gaw PA-C   REFERRING PROVIDER: Ollen Gross, MD  REFERRING DIAG: M79.18 (ICD-10-CM) - Right buttock pain  THERAPY DIAG:  Pain in right hip  Muscle weakness (generalized)  Unsteadiness on feet  Rationale for Evaluation and Treatment: Rehabilitation  ONSET DATE: 3-4 months ago or maybe more   SUBJECTIVE:   SUBJECTIVE STATEMENT: Patient reports she tried TENS at home and is now having worse Lt hip/back pain than yesterday's visit. She states her Rt hip feels good, she is now limited by Lt hip/back pain. She is using rollator for gait  EVAL: This  started at least 3-4 months ago, its been awhile. My posterior and my hip are hurting, when I take my first few steps it feels like the ball of my hip is popping out but I know that's not the case. I have to hold it, once I get moving I can walk a couple of miles. I can only lift it but so far, it feels weak and grabby. No fall or injury that caused all of this. I'm in a Parkinsons study, my dad has parkinsons, I feel like when I first stand up I need a second to get going, I need a little bit to feel like I'm getting going. If I were to just get up and go I would fall, if I wait a second I'm OK.   PERTINENT HISTORY: OA, blood dyscrasia, CPAP dependence, DM, fatty liver, gout, hip pain, HTN, knee pain, obesity, recurrent UTI, roux-en-y bypass, TKA 2021 PAIN:  Are you having pain? Yes: NPRS scale: 7/10 Pain location: Lt glute Pain description: weak, grabby Aggravating factors: transitions/starting a movement  Relieving factors: back brace, heat    PRECAUTIONS: None  RED FLAGS: None   WEIGHT BEARING RESTRICTIONS: No  FALLS:  Has patient fallen in last 6 months? No  LIVING ENVIRONMENT: Lives with: lives with their family Lives in: House/apartment Stairs: 1-2 to enter  Has following equipment at home: Single point cane, Environmental consultant - 2 wheeled, and bed side commode  OCCUPATION: retired   PLOF: Independent, Independent with basic ADLs, Independent with gait, and Independent with transfers  PATIENT GOALS: feel steadier when I stand, get pain under control   NEXT MD VISIT: Dr. Despina Hick early-mid September 2024  OBJECTIVE:   MUSCLE LENGTH:  Hip flexors Mod limitation per functional observation L HS/Piriformis mild limitation R HS/Piriformis Moderate limitation    PALPATION: R piriformis tender, R TFL sore as well    LOWER EXTREMITY MMT:  MMT Right eval Left eval  Hip flexion 3- 3-  Hip extension 2 3+  Hip abduction 3- 4  Hip adduction    Hip internal rotation    Hip  external rotation    Knee flexion 4- 4+  Knee extension 4 4+  Ankle dorsiflexion 5 5  Ankle plantarflexion    Ankle inversion    Ankle eversion     (Blank rows = not tested)   FUNCTIONAL TESTS:  5 times sit to stand: 20 seconds U UE on chair  Tandem stance 30 seconds B but shaky/wobbly, SLS RLE 3 seconds, L LE 10 seconds   GAIT: Distance walked: in clinic distances  Assistive device utilized: None Level of assistance: Complete Independence Comments: slow and steady, proximal weakness especially in hip abductors, tends to favor R LE  TODAY'S TREATMENT:    [facility] Adult PT Treatment:                                                DATE: 10/03/22 Therapeutic Exercise: Hip add ball squeeze 3 sec hold 2 x 10 Hip abd green TB 3 sec hold 2 x 10 Seated hip flexion isometric 3 sec hold x 10 Manual Therapy: STM Lt glutes at sacral lateral border  Modalities: Moist heat x 10 min   [facility] Adult PT Treatment:                                                DATE: 10/02/2022 Therapeutic Exercise: Seated green PB roll out front x10 --> alt diagonals x5 Supine (pelvis offloaded with folded towel & hand placement): Small range LTR Hooklying hip add iso ball squeeze x10 Seated: Figure 4 piriformis stretch  Modalities: TENS L glute/piriformis + moist heat L glute & L ribs x 15 min    [facility] Adult PT Treatment:                                                DATE: 09/11/2022 Therapeutic Exercise: NuStep L6 x 5 min Travell piriformis stretch (R) with strap 3x30" Sidelying reverse clam 2x10 Sidelying clam RTB 2x10 Supine bridges + ball squeeze 2x10 Seated HS stretch (R propped on stool) Seated --> supine figure 4/glute stretch  Captain morgan 8x3" Side stepping GTB crossed at ankles (counter) Rocker board laterally x1 min tapping --> x1 min static hold Tandem stance on foam 2x30" (B) Standing on foam 10# KB swing 2x30" Eccentric glute bridge slide    [facility] Adult PT Treatment:                                                 DATE: 09/06/22 Therapeutic Exercise: Nustep L6 x 6 minutes Travell piriformis stretch (R) with strap 3x30" Sidelying reverse clam - ball between knees x 15 Rocker board laterally x 1 min tapping, x 1 min static hold Tandem stance on foam 2 x 30 sec bilat Standing on foam 10# KB swing 2 x 30 sec Captain morgan 6x3" Resisted walking laterally 15# x 10 (B)   PATIENT EDUCATION:  Education details: Update HEP (clamshells starting no resistance) Person educated: Patient Education method: Explanation, Demonstration, and Handouts Education comprehension: verbalized understanding and returned demonstration  HOME EXERCISE PROGRAM: Access Code: [email or web address] URL: [email or web address] Date: 09/05/2022 Prepared by: Carlynn Herald  Exercises - Beginner Bridge  - 1-2 x daily - 7 x weekly - 1 sets - 10 reps - 1 second  hold - Seated Hamstring Stretch  - 1-2 x daily - 7 x weekly - 1 sets - 3 reps - 30 seconds  hold - Tandem Stance  - 1-2 x daily - 7 x weekly - 1 sets - 3 reps - 30 seconds  hold - Clam with Resistance  - 1 x daily - 7 x weekly -  3 sets - 10 reps - Prone Hip Extension on Table  - 1 x daily - 7 x weekly - 3 sets - 10 reps  ASSESSMENT:  CLINICAL IMPRESSION: Treatment focused on decreasing pain and improving pt tolerance to activity. At end of session pt reports decreased pain and presents with improved upright posture during gait   OBJECTIVE IMPAIRMENTS: Abnormal gait, decreased activity tolerance, decreased balance, decreased mobility, difficulty walking, decreased strength, increased fascial restrictions, increased muscle spasms, impaired flexibility, obesity, and pain.     GOALS: Goals reviewed with patient? Yes  SHORT TERM GOALS: Target date: 09/21/2022   Will be compliant with appropriate progressive HEP  Baseline: Goal status: MET  2.  Flexibility impairments to have improved by 50% Baseline: 30% improvement Goal status:  IN PROGRESS  3.  R buttock pain to be no more than 2/10 at worst  Baseline: 2/10 Goal status: MET    LONG TERM GOALS: Target date: 10/12/2022  MMT to have improved by at least 1 grade in all weak groups  Baseline:  Goal status: INITIAL  2.  Will report resolution of unsteadiness/inability to move well with initial transitions, also will perform 5x STS in 15 seconds or less no UEs  Baseline:  Goal status: INITIAL  3.  Will score at least 50/56 on Berg balance test  Baseline:  Goal status: INITIAL  4.  LEFS score to improve by at least 10 points  Baseline: 50% Goal status: INITIAL   PLAN:  PT FREQUENCY: 2x/week  PT DURATION: 6 weeks  PLANNED INTERVENTIONS: Therapeutic exercises, Therapeutic activity, Neuromuscular re-education, Balance training, Gait training, Patient/Family education, Self Care, Joint mobilization, Stair training, Aquatic Therapy, Dry Needling, Electrical stimulation, Spinal mobilization, Cryotherapy, Moist heat, Taping, Ultrasound, Ionotophoresis 4mg /ml Dexamethasone, Manual therapy, and Re-evaluation  PLAN FOR NEXT SESSION: Follow-up on L glute /ribcage pain from falls. Continue strength, flexibility, balance training   Reggy Eye, PT,DPT08/27/2410:01 AM

## 2022-10-07 ENCOUNTER — Other Ambulatory Visit (HOSPITAL_COMMUNITY): Payer: Self-pay

## 2022-10-08 ENCOUNTER — Other Ambulatory Visit (HOSPITAL_COMMUNITY): Payer: Self-pay

## 2022-10-08 ENCOUNTER — Other Ambulatory Visit: Payer: Self-pay | Admitting: Physician Assistant

## 2022-10-09 ENCOUNTER — Other Ambulatory Visit (HOSPITAL_COMMUNITY): Payer: Self-pay

## 2022-10-10 ENCOUNTER — Other Ambulatory Visit (HOSPITAL_COMMUNITY): Payer: Self-pay

## 2022-10-10 ENCOUNTER — Other Ambulatory Visit: Payer: Self-pay

## 2022-10-11 ENCOUNTER — Ambulatory Visit: Payer: PPO | Attending: Orthopedic Surgery | Admitting: Physical Therapy

## 2022-10-11 ENCOUNTER — Encounter: Payer: Self-pay | Admitting: Physical Therapy

## 2022-10-11 DIAGNOSIS — R2681 Unsteadiness on feet: Secondary | ICD-10-CM | POA: Insufficient documentation

## 2022-10-11 DIAGNOSIS — M6281 Muscle weakness (generalized): Secondary | ICD-10-CM | POA: Insufficient documentation

## 2022-10-11 DIAGNOSIS — M25551 Pain in right hip: Secondary | ICD-10-CM | POA: Diagnosis not present

## 2022-10-11 NOTE — Therapy (Addendum)
OUTPATIENT PHYSICAL THERAPY LOWER EXTREMITY TREATMENT AND RECERTIFICATION   Patient Name: Amanda [name] MRN: [id number] DOB:[birth year], 67 y.o., female Today's Date: 10/11/2022  END OF SESSION:  PT End of Session - 10/11/22 1140     Visit Number 7    Number of Visits 19    Date for PT Re-Evaluation 11/22/22    Authorization Type HTA    Authorization Time Period 08/31/22 to 10/12/22    Authorization - Visit Number 7    Progress Note Due on Visit 10    PT Start Time 1100    PT Stop Time 1142    PT Time Calculation (min) 42 min    Activity Tolerance Patient tolerated treatment well    Behavior During Therapy WFL for tasks assessed/performed                Past Medical History:  Diagnosis Date   Allergy    seasonal   Arthritis    all over   Blood dyscrasia    Carrys trait for Leiden Factor five never had any issues . Yhe only reason she was tested was because her mother had it   CPAP (continuous positive airway pressure) dependence    8 cm water w/ heated humidifier   Diabetes mellitus without complication (HCC)    Pre surgery - Gastric sx 2014 Roun-Y no diabetes since  no meds   Fatty liver 4-08   by [place]   Gout    Hip pain    Hypertension    Knee pain    Obesity    Pneumonia    Recurrent UTI    Sleep apnea    had roux en y lost 140lbs no longer needs cpap   Past Surgical History:  Procedure Laterality Date   APPENDECTOMY  1977   CESAREAN SECTION  1989   one   CHOLECYSTECTOMY  1977   DILATION AND CURETTAGE OF UTERUS  1987 & 1988   REDUCTION MAMMAPLASTY Bilateral 1985   ROUX-EN-Y GASTRIC BYPASS  2014   TOTAL KNEE ARTHROPLASTY Right 07/21/2019   Procedure: TOTAL KNEE ARTHROPLASTY;  Surgeon: Ollen Gross, MD;  Location: [facility];  Service: Orthopedics;  Laterality: Right;    Patient Active Problem List   Diagnosis Date Noted   Recurrent UTI 06/26/2022   Family history of Parkinson disease 06/26/2022   Post-menopausal 02/17/2022   Urinary  incontinence without sensory awareness 02/13/2022   Class 1 obesity due to excess calories with serious comorbidity and body mass index (BMI) of 30.0 to 30.9 in adult 07/13/2021   Dyslipidemia, goal LDL below 70 07/13/2021   Type 2 diabetes mellitus with hyperglycemia, without long-term current use of insulin (HCC) 03/18/2021   OAB (overactive bladder) 03/07/2021   Mixed stress and urge urinary incontinence 03/07/2021   Urinary frequency 03/07/2021   Groin pain, right 08/03/2020   Memory changes 07/12/2020   Right hip pain 07/12/2020   Chronic right-sided low back pain without sciatica 07/12/2020   Numbness and tingling of right side of face 07/12/2020   Osteoporosis 07/12/2020   Pap smear abnormality of cervix/human papillomavirus (HPV) positive 07/12/2020   OA (osteoarthritis) of knee 07/21/2019   Hammer toe of left foot 07/23/2018   Epigastric pain 07/23/2018   Onychomycosis 07/23/2018   Bilateral primary osteoarthritis of knee 03/05/2018   Gout 03/05/2018   Chronic pain of both knees 03/05/2018   Status post laparoscopic sleeve gastrectomy 03/04/2018   Obesity 05/23/2010   LEG CRAMPS 07/17/2009   Constipation 06/21/2009  FATTY LIVER DISEASE 09/12/2007   Essential hypertension 05/16/2007   ARTHRITIS 05/16/2007   DIVERTICULOSIS, COLON 10/18/2006   HYPERCHOLESTEROLEMIA 09/05/2006   Factor V deficiency (HCC) 09/05/2006    PCP: Tandy Gaw PA-C   REFERRING PROVIDER: Ollen Gross, MD  REFERRING DIAG: M79.18 (ICD-10-CM) - Right buttock pain  THERAPY DIAG:  Pain in right hip  Muscle weakness (generalized)  Unsteadiness on feet  Rationale for Evaluation and Treatment: Rehabilitation  ONSET DATE: 3-4 months ago or maybe more   SUBJECTIVE:   SUBJECTIVE STATEMENT: Patient reports she continues with her main c/o of Lt hip pain. Her Rt hip pain is resolved. She is able to walk without rollator short distance but continues to use rollator a lot at home and in  community. She states she is still having trouble sleeping due to LT glute pain  EVAL: This started at least 3-4 months ago, its been awhile. My posterior and my hip are hurting, when I take my first few steps it feels like the ball of my hip is popping out but I know that's not the case. I have to hold it, once I get moving I can walk a couple of miles. I can only lift it but so far, it feels weak and grabby. No fall or injury that caused all of this. I'm in a Parkinsons study, my dad has parkinsons, I feel like when I first stand up I need a second to get going, I need a little bit to feel like I'm getting going. If I were to just get up and go I would fall, if I wait a second I'm OK.   PERTINENT HISTORY: OA, blood dyscrasia, CPAP dependence, DM, fatty liver, gout, hip pain, HTN, knee pain, obesity, recurrent UTI, roux-en-y bypass, TKA 2021 PAIN:  Are you having pain? Yes: NPRS scale: 5/10 Pain location: Lt glute Pain description: weak, grabby Aggravating factors: transitions/starting a movement  Relieving factors: back brace, heat    PRECAUTIONS: None  RED FLAGS: None   WEIGHT BEARING RESTRICTIONS: No  FALLS:  Has patient fallen in last 6 months? No  LIVING ENVIRONMENT: Lives with: lives with their family Lives in: House/apartment Stairs: 1-2 to enter  Has following equipment at home: Single point cane, Environmental consultant - 2 wheeled, and bed side commode  OCCUPATION: retired   PLOF: Independent, Independent with basic ADLs, Independent with gait, and Independent with transfers  PATIENT GOALS: feel steadier when I stand, get pain under control   NEXT MD VISIT: Dr. Despina Hick early-mid September 2024  OBJECTIVE:   MUSCLE LENGTH:  Hip flexors Mod limitation per functional observation L HS/Piriformis mild limitation R HS/Piriformis Moderate limitation    PALPATION: R piriformis tender, R TFL sore as well    LOWER EXTREMITY MMT:  MMT Right eval Left eval Right 9/4 Left 9/4  Hip  flexion 3- 3- 4 4-  Hip extension 2 3+ 3 3-  Hip abduction 3- 4 4 4-  Hip adduction      Hip internal rotation      Hip external rotation      Knee flexion 4- 4+ 4+ 4+  Knee extension 4 4+ 4+ 4+  Ankle dorsiflexion 5 5    Ankle plantarflexion      Ankle inversion      Ankle eversion       (Blank rows = not tested)   FUNCTIONAL TESTS:  EVAL: 5 times sit to stand: 20 seconds U UE on chair  Tandem stance 30 seconds B  but shaky/wobbly, SLS RLE 3 seconds, L LE 10 seconds  5x STS (10/11/22): 14.51 seconds GAIT: Distance walked: in clinic distances  Assistive device utilized: None Level of assistance: Complete Independence Comments: slow and steady, proximal weakness especially in hip abductors, tends to favor R LE  BERG BALANCE TEST Sitting to Standing: 3.      Stands independently using hands Standing Unsupported: 4.      Stands safely for 2 minutes Sitting Unsupported: 4.     Sits for 2 minutes independently Standing to Sitting: 2.     Uses back of legs against chair to control descent  Transfers: 3.     Transfers safely definite use of hands Standing with eyes closed: 4.     Stands safely for 10 seconds  Standing with feet together: 2.     Unable to hold for 30 seconds  Reaching forward with outstretched arm: 4.     Reaches forward 10 inches Retrieving object from the floor: 3.     Able to pick up with supervision Turning to look behind: 4.     Looks behind from both sides and weight shifts well Turning 360 degrees: 2.     Able to turn slowly, but safely Place alternate foot on stool: 1.     Completes >2 steps with minimal assist Standing with one foot in front: 3.     Independent foot ahead for 30 seconds Standing on one foot: 3.     Holds 5-10 seconds  Total Score: 42/56   TODAY'S TREATMENT:    [facility] Adult PT Treatment:                                                DATE: 10/11/22 Therapeutic Exercise: Nustep L6 x 5 min 5x STS: 14.51 seconds MMT (see above) LEFS  47%  Neuromuscular re-ed: Berg balance test performed 42/56   [facility] Adult PT Treatment:                                                DATE: 10/03/22 Therapeutic Exercise: Hip add ball squeeze 3 sec hold 2 x 10 Hip abd green TB 3 sec hold 2 x 10 Seated hip flexion isometric 3 sec hold x 10 Manual Therapy: STM Lt glutes at sacral lateral border  Modalities: Moist heat x 10 min   [facility] Adult PT Treatment:                                                DATE: 10/02/2022 Therapeutic Exercise: Seated green PB roll out front x10 --> alt diagonals x5 Supine (pelvis offloaded with folded towel & hand placement): Small range LTR Hooklying hip add iso ball squeeze x10 Seated: Figure 4 piriformis stretch  Modalities: TENS L glute/piriformis + moist heat L glute & L ribs x 15 min    [facility] Adult PT Treatment:  DATE: 09/11/2022 Therapeutic Exercise: NuStep L6 x 5 min Travell piriformis stretch (R) with strap 3x30" Sidelying reverse clam 2x10 Sidelying clam RTB 2x10 Supine bridges + ball squeeze 2x10 Seated HS stretch (R propped on stool) Seated --> supine figure 4/glute stretch  Captain morgan 8x3" Side stepping GTB crossed at ankles (counter) Rocker board laterally x1 min tapping --> x1 min static hold Tandem stance on foam 2x30" (B) Standing on foam 10# KB swing 2x30" Eccentric glute bridge slide   PATIENT EDUCATION:  Education details: Update HEP (clamshells starting no resistance) Person educated: Patient Education method: Explanation, Demonstration, and Handouts Education comprehension: verbalized understanding and returned demonstration  HOME EXERCISE PROGRAM: Access Code: [email or web address] URL: [email or web address] Date: 09/05/2022 Prepared by: Carlynn Herald  Exercises - Beginner Bridge  - 1-2 x daily - 7 x weekly - 1 sets - 10 reps - 1 second  hold - Seated Hamstring Stretch  - 1-2 x daily - 7 x weekly - 1 sets - 3  reps - 30 seconds  hold - Tandem Stance  - 1-2 x daily - 7 x weekly - 1 sets - 3 reps - 30 seconds  hold - Clam with Resistance  - 1 x daily - 7 x weekly - 3 sets - 10 reps - Prone Hip Extension on Table  - 1 x daily - 7 x weekly - 3 sets - 10 reps  ASSESSMENT:  CLINICAL IMPRESSION: Session focused on re-evaluation. Pt has improved Rt LE strength and decreased pain. Her major deficit now is Lt hip pain and Lt hip has decreased strength since eval due to increased pain. She scored 42/56 on Berg indicating increased risk of falls. Goals updated. Pt will continue to benefit from skilled PT to work towards new and remaining goals.   OBJECTIVE IMPAIRMENTS: Abnormal gait, decreased activity tolerance, decreased balance, decreased mobility, difficulty walking, decreased strength, increased fascial restrictions, increased muscle spasms, impaired flexibility, obesity, and pain.     GOALS: Goals reviewed with patient? Yes  SHORT TERM GOALS: Target date: 09/21/2022   Will be compliant with appropriate progressive HEP  Baseline: Goal status: MET  2.  Flexibility impairments to have improved by 50% Baseline: 30% improvement Goal status: IN PROGRESS  3.  R buttock pain to be no more than 2/10 at worst  Baseline: 2/10 Goal status: MET    LONG TERM GOALS: Target date: 11/22/2022    MMT to have improved by at least 1 grade in all weak groups  Baseline:  Goal status: Met for Rt LE, IN PROGRESS for LT LE  2.  Will report resolution of unsteadiness/inability to move well with initial transitions, also will perform 5x STS in 15 seconds or less no UEs  Baseline: 14.51 with UE support Goal status: IN PROGRESS  3.  Will score at least 50/56 on Berg balance test  Baseline:  Goal status: IN PROGRESS  4.  LEFS score to improve by at least 10 points  Baseline: 50% (eval), 47% (10/11/22) Goal status: IN PROGRESS 5.  Pt will be able to walk household distance without AD Baseline:  Goal status:  INITIAL 6.  Pt will sleep x 6 hours without waking due to pain Baseline:  Goal status: INITIAL     PLAN:  PT FREQUENCY: 2x/week  PT DURATION: 6 weeks  PLANNED INTERVENTIONS: Therapeutic exercises, Therapeutic activity, Neuromuscular re-education, Balance training, Gait training, Patient/Family education, Self Care, Joint mobilization, Stair training, Aquatic Therapy, Dry Needling, Electrical stimulation, Spinal mobilization, Cryotherapy, Moist heat, Taping, Ultrasound,  Ionotophoresis 4mg /ml Dexamethasone, Manual therapy, and Re-evaluation  PLAN FOR NEXT SESSION: update HEP! continue Lt hip strength, balance, gait  Reggy Eye, PT,DPT09/05/2409:50 AM

## 2022-10-11 NOTE — Addendum Note (Signed)
Addended by: Leone Brand on: 10/11/2022 11:51 AM   Modules accepted: Orders

## 2022-10-12 ENCOUNTER — Other Ambulatory Visit (HOSPITAL_COMMUNITY): Payer: Self-pay

## 2022-10-12 MED ORDER — METHOCARBAMOL 500 MG PO TABS
500.0000 mg | ORAL_TABLET | Freq: Four times a day (QID) | ORAL | 1 refills | Status: AC
  Filled 2022-10-12: qty 120, 30d supply, fill #0
  Filled 2022-12-15 – 2023-01-22 (×3): qty 120, 30d supply, fill #1

## 2022-10-13 ENCOUNTER — Ambulatory Visit: Payer: PPO | Admitting: Physical Therapy

## 2022-10-13 ENCOUNTER — Other Ambulatory Visit: Payer: Self-pay

## 2022-10-17 ENCOUNTER — Ambulatory Visit: Payer: PPO

## 2022-10-17 DIAGNOSIS — R2681 Unsteadiness on feet: Secondary | ICD-10-CM

## 2022-10-17 DIAGNOSIS — M25551 Pain in right hip: Secondary | ICD-10-CM

## 2022-10-17 DIAGNOSIS — M6281 Muscle weakness (generalized): Secondary | ICD-10-CM

## 2022-10-17 NOTE — Therapy (Signed)
OUTPATIENT PHYSICAL THERAPY LOWER EXTREMITY TREATMENT    Patient Name: Amanda [name] MRN: [id number] DOB:Nov 05, 1955, 67 y.o., female Today's Date: 10/17/2022  END OF SESSION:  PT End of Session - 10/17/22 1532     Visit Number 8    Number of Visits 19    Date for PT Re-Evaluation 11/22/22    Authorization Type HTA    Authorization Time Period 08/31/22 to 10/12/22    Progress Note Due on Visit 10    PT Start Time 1532    PT Stop Time 1611    PT Time Calculation (min) 39 min    Activity Tolerance Patient tolerated treatment well    Behavior During Therapy WFL for tasks assessed/performed                 Past Medical History:  Diagnosis Date   Allergy    seasonal   Arthritis    all over   Blood dyscrasia    Carrys trait for Leiden Factor five never had any issues . Yhe only reason she was tested was because her mother had it   CPAP (continuous positive airway pressure) dependence    8 cm water w/ heated humidifier   Diabetes mellitus without complication (HCC)    Pre surgery - Gastric sx 2014 Roun-Y no diabetes since  no meds   Fatty liver 4-08   by [place]   Gout    Hip pain    Hypertension    Knee pain    Obesity    Pneumonia    Recurrent UTI    Sleep apnea    had roux en y lost 140lbs no longer needs cpap   Past Surgical History:  Procedure Laterality Date   APPENDECTOMY  1977   CESAREAN SECTION  1989   one   CHOLECYSTECTOMY  1977   DILATION AND CURETTAGE OF UTERUS  1987 & 1988   REDUCTION MAMMAPLASTY Bilateral 1985   ROUX-EN-Y GASTRIC BYPASS  2014   TOTAL KNEE ARTHROPLASTY Right 07/21/2019   Procedure: TOTAL KNEE ARTHROPLASTY;  Surgeon: Ollen Gross, MD;  Location: [facility];  Service: Orthopedics;  Laterality: Right;    Patient Active Problem List   Diagnosis Date Noted   Recurrent UTI 06/26/2022   Family history of Parkinson disease 06/26/2022   Post-menopausal 02/17/2022   Urinary incontinence without sensory awareness 02/13/2022    Class 1 obesity due to excess calories with serious comorbidity and body mass index (BMI) of 30.0 to 30.9 in adult 07/13/2021   Dyslipidemia, goal LDL below 70 07/13/2021   Type 2 diabetes mellitus with hyperglycemia, without long-term current use of insulin (HCC) 03/18/2021   OAB (overactive bladder) 03/07/2021   Mixed stress and urge urinary incontinence 03/07/2021   Urinary frequency 03/07/2021   Groin pain, right 08/03/2020   Memory changes 07/12/2020   Right hip pain 07/12/2020   Chronic right-sided low back pain without sciatica 07/12/2020   Numbness and tingling of right side of face 07/12/2020   Osteoporosis 07/12/2020   Pap smear abnormality of cervix/human papillomavirus (HPV) positive 07/12/2020   OA (osteoarthritis) of knee 07/21/2019   Hammer toe of left foot 07/23/2018   Epigastric pain 07/23/2018   Onychomycosis 07/23/2018   Bilateral primary osteoarthritis of knee 03/05/2018   Gout 03/05/2018   Chronic pain of both knees 03/05/2018   Status post laparoscopic sleeve gastrectomy 03/04/2018   Obesity 05/23/2010   LEG CRAMPS 07/17/2009   Constipation 06/21/2009   FATTY LIVER DISEASE 09/12/2007  Essential hypertension 05/16/2007   ARTHRITIS 05/16/2007   DIVERTICULOSIS, COLON 10/18/2006   HYPERCHOLESTEROLEMIA 09/05/2006   Factor V deficiency (HCC) 09/05/2006    PCP: Tandy Gaw PA-C   REFERRING PROVIDER: Ollen Gross, MD  REFERRING DIAG: M79.18 (ICD-10-CM) - Right buttock pain  THERAPY DIAG:  Pain in right hip  Muscle weakness (generalized)  Unsteadiness on feet  Rationale for Evaluation and Treatment: Rehabilitation  ONSET DATE: 3-4 months ago or maybe more   SUBJECTIVE:   SUBJECTIVE STATEMENT: Patient reports she continues with her main c/o of Lt hip pain. She feels the Rt hip pain has resolved. She wants to hold off on more visits until she sees Dr. Lequita Halt on Thursday.   EVAL: This started at least 3-4 months ago, its been awhile. My posterior  and my hip are hurting, when I take my first few steps it feels like the ball of my hip is popping out but I know that's not the case. I have to hold it, once I get moving I can walk a couple of miles. I can only lift it but so far, it feels weak and grabby. No fall or injury that caused all of this. I'm in a Parkinsons study, my dad has parkinsons, I feel like when I first stand up I need a second to get going, I need a little bit to feel like I'm getting going. If I were to just get up and go I would fall, if I wait a second I'm OK.   PERTINENT HISTORY: OA, blood dyscrasia, CPAP dependence, DM, fatty liver, gout, hip pain, HTN, knee pain, obesity, recurrent UTI, roux-en-y bypass, TKA 2021 PAIN:  Are you having pain? Yes: NPRS scale: 5/10 Pain location: Lt posterolateral hip Pain description: weak, grabby Aggravating factors: transitions/starting a movement  Relieving factors: back brace, heat    PRECAUTIONS: None  RED FLAGS: None   WEIGHT BEARING RESTRICTIONS: No  FALLS:  Has patient fallen in last 6 months? No  LIVING ENVIRONMENT: Lives with: lives with their family Lives in: House/apartment Stairs: 1-2 to enter  Has following equipment at home: Single point cane, Environmental consultant - 2 wheeled, and bed side commode  OCCUPATION: retired   PLOF: Independent, Independent with basic ADLs, Independent with gait, and Independent with transfers  PATIENT GOALS: feel steadier when I stand, get pain under control   NEXT MD VISIT: Dr. Despina Hick early-mid September 2024  OBJECTIVE:   MUSCLE LENGTH:  Hip flexors Mod limitation per functional observation L HS/Piriformis mild limitation R HS/Piriformis Moderate limitation    PALPATION: R piriformis tender, R TFL sore as well    LOWER EXTREMITY MMT:  MMT Right eval Left eval Right 9/4 Left 9/4  Hip flexion 3- 3- 4 4-  Hip extension 2 3+ 3 3-  Hip abduction 3- 4 4 4-  Hip adduction      Hip internal rotation      Hip external rotation       Knee flexion 4- 4+ 4+ 4+  Knee extension 4 4+ 4+ 4+  Ankle dorsiflexion 5 5    Ankle plantarflexion      Ankle inversion      Ankle eversion       (Blank rows = not tested)   FUNCTIONAL TESTS:  EVAL: 5 times sit to stand: 20 seconds U UE on chair  Tandem stance 30 seconds B but shaky/wobbly, SLS RLE 3 seconds, L LE 10 seconds  5x STS (10/11/22): 14.51 seconds GAIT: Distance walked: in clinic  distances  Assistive device utilized: None Level of assistance: Complete Independence Comments: slow and steady, proximal weakness especially in hip abductors, tends to favor R LE  BERG BALANCE TEST Sitting to Standing: 3.      Stands independently using hands Standing Unsupported: 4.      Stands safely for 2 minutes Sitting Unsupported: 4.     Sits for 2 minutes independently Standing to Sitting: 2.     Uses back of legs against chair to control descent  Transfers: 3.     Transfers safely definite use of hands Standing with eyes closed: 4.     Stands safely for 10 seconds  Standing with feet together: 2.     Unable to hold for 30 seconds  Reaching forward with outstretched arm: 4.     Reaches forward 10 inches Retrieving object from the floor: 3.     Able to pick up with supervision Turning to look behind: 4.     Looks behind from both sides and weight shifts well Turning 360 degrees: 2.     Able to turn slowly, but safely Place alternate foot on stool: 1.     Completes >2 steps with minimal assist Standing with one foot in front: 3.     Independent foot ahead for 30 seconds Standing on one foot: 3.     Holds 5-10 seconds  Total Score: 42/56   TODAY'S TREATMENT:    [facility] Adult PT Treatment:                                                DATE: 10/17/22 Therapeutic Exercise: Bridge 2 x 10  Clamshells 2 x 10; LLE Hooklying march 2 x 10  Hooklying resisted hip abduction green band 2 x 10  LAQ 2 x 10 @ 1.5 lbs  Updated HEP  Manual Therapy: STM Lt gluteals, piriformis, IT band      [facility] Adult PT Treatment:                                                DATE: 10/11/22 Therapeutic Exercise: Nustep L6 x 5 min 5x STS: 14.51 seconds MMT (see above) LEFS 47%  Neuromuscular re-ed: Berg balance test performed 42/56   [facility] Adult PT Treatment:                                                DATE: 10/03/22 Therapeutic Exercise: Hip add ball squeeze 3 sec hold 2 x 10 Hip abd green TB 3 sec hold 2 x 10 Seated hip flexion isometric 3 sec hold x 10 Manual Therapy: STM Lt glutes at sacral lateral border  Modalities: Moist heat x 10 min   [facility] Adult PT Treatment:                                                DATE: 10/02/2022 Therapeutic Exercise: Seated green PB roll out front x10 --> alt diagonals x5 Supine (  pelvis offloaded with folded towel & hand placement): Small range LTR Hooklying hip add iso ball squeeze x10 Seated: Figure 4 piriformis stretch  Modalities: TENS L glute/piriformis + moist heat L glute & L ribs x 15 min   PATIENT EDUCATION:  Education details: Update HEP  Person educated: Patient Education method: Explanation, Demonstration, and Handouts Education comprehension: verbalized understanding and returned demonstration  HOME EXERCISE PROGRAM: Access Code: [email or web address] URL: [email or web address] Date: 10/17/2022 Prepared by: Letitia Libra  Exercises - Beginner Bridge  - 1-2 x daily - 7 x weekly - 1 sets - 10 reps - 1 second  hold - Seated Hamstring Stretch  - 1-2 x daily - 7 x weekly - 1 sets - 3 reps - 30 seconds  hold - Tandem Stance  - 1-2 x daily - 7 x weekly - 1 sets - 3 reps - 30 seconds  hold - Clam with Resistance  - 1 x daily - 7 x weekly - 2 sets - 10 reps - Supine March  - 1 x daily - 7 x weekly - 2 sets - 10 reps - Hooklying Clamshell with Resistance  - 1 x daily - 7 x weekly - 2 sets - 10 reps - Seated Long Arc Quad  - 1 x daily - 7 x weekly - 2 sets - 10 reps  ASSESSMENT:  CLINICAL IMPRESSION: Patient  continues to report Lt hip pain as her chief complaint, stating that her Rt hip continues to feel good. We focused on mat based hip strengthening today with fairly good tolerance with HEP updated to include further strengthening. She would like to hold on scheduling further visits until she has f/u with Dr. Lequita Halt later this week.    OBJECTIVE IMPAIRMENTS: Abnormal gait, decreased activity tolerance, decreased balance, decreased mobility, difficulty walking, decreased strength, increased fascial restrictions, increased muscle spasms, impaired flexibility, obesity, and pain.     GOALS: Goals reviewed with patient? Yes  SHORT TERM GOALS: Target date: 09/21/2022   Will be compliant with appropriate progressive HEP  Baseline: Goal status: MET  2.  Flexibility impairments to have improved by 50% Baseline: 30% improvement Goal status: IN PROGRESS  3.  R buttock pain to be no more than 2/10 at worst  Baseline: 2/10 Goal status: MET    LONG TERM GOALS: Target date: 11/22/2022    MMT to have improved by at least 1 grade in all weak groups  Baseline:  Goal status: Met for Rt LE, IN PROGRESS for LT LE  2.  Will report resolution of unsteadiness/inability to move well with initial transitions, also will perform 5x STS in 15 seconds or less no UEs  Baseline: 14.51 with UE support Goal status: IN PROGRESS  3.  Will score at least 50/56 on Berg balance test  Baseline:  Goal status: IN PROGRESS  4.  LEFS score to improve by at least 10 points  Baseline: 50% (eval), 47% (10/11/22) Goal status: IN PROGRESS 5.  Pt will be able to walk household distance without AD Baseline:  Goal status: INITIAL 6.  Pt will sleep x 6 hours without waking due to pain Baseline:  Goal status: INITIAL     PLAN:  PT FREQUENCY: 2x/week  PT DURATION: 6 weeks  PLANNED INTERVENTIONS: Therapeutic exercises, Therapeutic activity, Neuromuscular re-education, Balance training, Gait training, Patient/Family  education, Self Care, Joint mobilization, Stair training, Aquatic Therapy, Dry Needling, Electrical stimulation, Spinal mobilization, Cryotherapy, Moist heat, Taping, Ultrasound, Ionotophoresis 4mg /ml Dexamethasone, Manual therapy, and Re-evaluation  PLAN FOR  NEXT SESSION: continue Lt hip strength, balance, gait  Letitia Libra, PT, DPT, ATC 10/17/22 4:12 PM

## 2022-10-19 DIAGNOSIS — M25551 Pain in right hip: Secondary | ICD-10-CM | POA: Diagnosis not present

## 2022-10-19 DIAGNOSIS — M25552 Pain in left hip: Secondary | ICD-10-CM | POA: Diagnosis not present

## 2022-10-22 ENCOUNTER — Other Ambulatory Visit (HOSPITAL_COMMUNITY): Payer: Self-pay

## 2022-10-25 ENCOUNTER — Telehealth (INDEPENDENT_AMBULATORY_CARE_PROVIDER_SITE_OTHER): Payer: PPO | Admitting: Physician Assistant

## 2022-10-25 ENCOUNTER — Other Ambulatory Visit (HOSPITAL_COMMUNITY): Payer: Self-pay

## 2022-10-25 ENCOUNTER — Other Ambulatory Visit: Payer: Self-pay

## 2022-10-25 ENCOUNTER — Encounter: Payer: Self-pay | Admitting: Physician Assistant

## 2022-10-25 DIAGNOSIS — M25552 Pain in left hip: Secondary | ICD-10-CM

## 2022-10-25 DIAGNOSIS — M545 Low back pain, unspecified: Secondary | ICD-10-CM | POA: Diagnosis not present

## 2022-10-25 MED ORDER — HYDROCODONE-ACETAMINOPHEN 5-325 MG PO TABS: 1.0000 | ORAL_TABLET | ORAL | 0 refills | Status: DC | PRN

## 2022-10-25 MED ORDER — PREDNISONE 50 MG PO TABS
50.0000 mg | ORAL_TABLET | Freq: Every day | ORAL | 0 refills | Status: AC
Start: 2022-10-25 — End: 2022-10-30
  Filled 2022-10-25: qty 5, 5d supply, fill #0

## 2022-10-25 MED ORDER — GABAPENTIN 300 MG PO CAPS
300.0000 mg | ORAL_CAPSULE | Freq: Three times a day (TID) | ORAL | 1 refills | Status: DC
Start: 2022-10-25 — End: 2023-01-22
  Filled 2022-10-25: qty 270, 45d supply, fill #0
  Filled 2022-12-14: qty 270, 45d supply, fill #1

## 2022-10-25 MED ORDER — HYDROCODONE-ACETAMINOPHEN 5-325 MG PO TABS
1.0000 | ORAL_TABLET | ORAL | 0 refills | Status: AC | PRN
  Filled 2022-10-25: qty 30, 5d supply, fill #0

## 2022-10-25 NOTE — Progress Notes (Unsigned)
..Virtual Visit via Video Note  I connected with Amanda [name] on 10/26/22 at  9:50 AM EDT by a video enabled telemedicine application and verified that I am speaking with the correct person using two identifiers.  Location: Patient: home Provider: clinic  .Marland KitchenParticipating in visit:  Patient: Amanda [name] Provider: Tandy Gaw PA-C Provider in training: Weston Anna PA-S   I discussed the limitations of evaluation and management by telemedicine and the availability of in person appointments. The patient expressed understanding and agreed to proceed.  History of Present Illness: Pt is a 67 yo obese female with hx of OA and lumbar DDD who calls into the clinic with left sided hip pain that is similar to her right sided hip pain that she had in 2022 and got epidural injections for and highly effective. She is in a lot of pain for the last 3 weeks. She has had one or two falls in last 3 months that could have exacerbated it. She denies any radiation of pain down leg. She feels like her actual hip is popping ever time she moves. Pain is unbearable at times. Last MRI was 08/2020.   IMPRESSION: Given right lower extremity symptoms, dominant findings are likely at L2-3 where there is severe right foraminal narrowing with encroachment on the exiting right L2 due to a disc protrusion. A down turning right subarticular recess protrusion at L2-3 is also seen which could impact the descending right L3 root.   Moderate central canal stenosis and moderately severe left foraminal narrowing at L4-5 where there is a disc bulge and left foraminal protrusion.   Moderately severe left foraminal narrowing at L5-S1 due to disc bulging and endplate spur.   She has been taking 3 of her 100mg  of gabapentin multiple times a day which helps some. She has also been taking ibuprofen and robaxin.   Denies any bowel or bladder dysfunction or saddle anesthesia.  Marland Kitchen. Active Ambulatory Problems    Diagnosis Date  Noted   HYPERCHOLESTEROLEMIA 09/05/2006   Factor V deficiency (HCC) 09/05/2006   Essential hypertension 05/16/2007   DIVERTICULOSIS, COLON 10/18/2006   Constipation 06/21/2009   FATTY LIVER DISEASE 09/12/2007   ARTHRITIS 05/16/2007   LEG CRAMPS 07/17/2009   Obesity 05/23/2010   Status post laparoscopic sleeve gastrectomy 03/04/2018   Bilateral primary osteoarthritis of knee 03/05/2018   Gout 03/05/2018   Chronic pain of both knees 03/05/2018   Hammer toe of left foot 07/23/2018   Epigastric pain 07/23/2018   Onychomycosis 07/23/2018   OA (osteoarthritis) of knee 07/21/2019   Memory changes 07/12/2020   Right hip pain 07/12/2020   Chronic right-sided low back pain without sciatica 07/12/2020   Numbness and tingling of right side of face 07/12/2020   Osteoporosis 07/12/2020   Pap smear abnormality of cervix/human papillomavirus (HPV) positive 07/12/2020   Groin pain, right 08/03/2020   OAB (overactive bladder) 03/07/2021   Mixed stress and urge urinary incontinence 03/07/2021   Urinary frequency 03/07/2021   Type 2 diabetes mellitus with hyperglycemia, without long-term current use of insulin (HCC) 03/18/2021   Class 1 obesity due to excess calories with serious comorbidity and body mass index (BMI) of 30.0 to 30.9 in adult 07/13/2021   Dyslipidemia, goal LDL below 70 07/13/2021   Urinary incontinence without sensory awareness 02/13/2022   Post-menopausal 02/17/2022   Recurrent UTI 06/26/2022   Family history of Parkinson disease 06/26/2022   Resolved Ambulatory Problems    Diagnosis Date Noted   Acute cystitis 07/17/2009   SLEEP  APNEA 10/25/2007   Acute pain of right thigh 08/03/2020   Past Medical History:  Diagnosis Date   Allergy    Arthritis    Blood dyscrasia    CPAP (continuous positive airway pressure) dependence    Diabetes mellitus without complication (HCC)    Fatty liver 4-08   Hip pain    Hypertension    Knee pain    Pneumonia    Sleep apnea      Observations/Objective: No acute distress Normal mood and appearance Normal breathing   Assessment and Plan: Marland KitchenMarland KitchenDiagnoses and all orders for this visit:  Left hip pain -     gabapentin (NEURONTIN) 300 MG capsule; Take 1-2 capsules (300-600 mg total) by mouth 3 (three) times daily. -     predniSONE (DELTASONE) 50 MG tablet; Take 1 tablet (50 mg total) by mouth daily for 5 days. -     DG Hip Unilat W OR W/O Pelvis 2-3 Views Left; Future -     HYDROcodone-acetaminophen (NORCO/VICODIN) 5-325 MG tablet; Take 1 tablet by mouth every 4 (four) hours as needed for up to 5 days for moderate pain.  Acute left-sided low back pain without sciatica -     gabapentin (NEURONTIN) 300 MG capsule; Take 1-2 capsules (300-600 mg total) by mouth 3 (three) times daily. -     predniSONE (DELTASONE) 50 MG tablet; Take 1 tablet (50 mg total) by mouth daily for 5 days. -     DG Hip Unilat W OR W/O Pelvis 2-3 Views Left; Future -     HYDROcodone-acetaminophen (NORCO/VICODIN) 5-325 MG tablet; Take 1 tablet by mouth every 4 (four) hours as needed for up to 5 days for moderate pain.  Other orders -     Discontinue: HYDROcodone-acetaminophen (NORCO/VICODIN) 5-325 MG tablet; Take 1 tablet by mouth every 4 (four) hours as needed for up to 5 days for moderate pain.   Low back pain into left hip with popping ? If more OA or if due to known lumbar DDD at L4-S1 Make appt with Dr. Karie Schwalbe Will xray left hip No red flag symptoms today Start prednisone for 5 days, avoid NSAIDs during this time Continue robaxin Norco for break through pain .Marland KitchenPDMP reviewed during this encounter. Increased gabapentin to 300mg  TID     Follow Up Instructions:    I discussed the assessment and treatment plan with the patient. The patient was provided an opportunity to ask questions and all were answered. The patient agreed with the plan and demonstrated an understanding of the instructions.   The patient was advised to call back or seek  an in-person evaluation if the symptoms worsen or if the condition fails to improve as anticipated.    Tandy Gaw, PA-C

## 2022-10-26 ENCOUNTER — Encounter: Payer: Self-pay | Admitting: Physician Assistant

## 2022-10-26 ENCOUNTER — Encounter (HOSPITAL_COMMUNITY): Payer: Self-pay

## 2022-10-26 ENCOUNTER — Other Ambulatory Visit (HOSPITAL_COMMUNITY): Payer: Self-pay

## 2022-11-03 ENCOUNTER — Other Ambulatory Visit: Payer: Self-pay | Admitting: Physician Assistant

## 2022-11-03 NOTE — Telephone Encounter (Signed)
Forwarding to pcp for rx fill

## 2022-11-03 NOTE — Telephone Encounter (Signed)
Patient called in stating that her hip is still hurting and the only thing that helped is the prednisone but she is out. She needs suggestions. Please advise

## 2022-11-05 ENCOUNTER — Other Ambulatory Visit (HOSPITAL_COMMUNITY): Payer: Self-pay

## 2022-11-06 ENCOUNTER — Other Ambulatory Visit (HOSPITAL_COMMUNITY): Payer: Self-pay

## 2022-11-07 ENCOUNTER — Telehealth: Payer: Self-pay | Admitting: Physician Assistant

## 2022-11-07 NOTE — Telephone Encounter (Signed)
No referral is needed she just needs to make appt.

## 2022-11-07 NOTE — Telephone Encounter (Signed)
Returned patients call back, patient advised that Ortho appointment can be made with Dr. Benjamin Stain first available appointment. Patient advised that I can check schedule to see what is available.   Patient left message on voicemail stating she has severe back pain and wanted to know if referral would be required prior to scheduling an appointment with Dr. Benjamin Stain.

## 2022-11-08 ENCOUNTER — Ambulatory Visit: Payer: PPO

## 2022-11-09 ENCOUNTER — Ambulatory Visit: Payer: PPO | Attending: Orthopedic Surgery

## 2022-11-09 ENCOUNTER — Other Ambulatory Visit: Payer: Self-pay

## 2022-11-09 DIAGNOSIS — M25562 Pain in left knee: Secondary | ICD-10-CM | POA: Insufficient documentation

## 2022-11-09 DIAGNOSIS — M25552 Pain in left hip: Secondary | ICD-10-CM | POA: Diagnosis not present

## 2022-11-09 DIAGNOSIS — M25551 Pain in right hip: Secondary | ICD-10-CM | POA: Insufficient documentation

## 2022-11-09 DIAGNOSIS — M6281 Muscle weakness (generalized): Secondary | ICD-10-CM | POA: Insufficient documentation

## 2022-11-09 DIAGNOSIS — R2681 Unsteadiness on feet: Secondary | ICD-10-CM | POA: Diagnosis not present

## 2022-11-09 NOTE — Therapy (Addendum)
OUTPATIENT PHYSICAL THERAPY LOWER EXTREMITY RE-EVALUATION NOTE AND DISCHARGE   Patient Name: Amanda [name] MRN: [id number] DOB:Aug 16, 1955, 67 y.o., female Today's Date: 11/09/2022  END OF SESSION:  PT End of Session - 11/09/22 0919     Visit Number 9    Number of Visits 19    Date for PT Re-Evaluation 12/28/22    Authorization Type HTA    Authorization Time Period 10/12/2022 to 12/28/2022    Progress Note Due on Visit 19    PT Start Time 0802    PT Stop Time 0840    PT Time Calculation (min) 38 min    Activity Tolerance Patient tolerated treatment well                  Past Medical History:  Diagnosis Date   Allergy    seasonal   Arthritis    all over   Blood dyscrasia    Carrys trait for Leiden Factor five never had any issues . Yhe only reason she was tested was because her mother had it   CPAP (continuous positive airway pressure) dependence    8 cm water w/ heated humidifier   Diabetes mellitus without complication (HCC)    Pre surgery - Gastric sx 2014 Roun-Y no diabetes since  no meds   Fatty liver 4-08   by [place]   Gout    Hip pain    Hypertension    Knee pain    Obesity    Pneumonia    Recurrent UTI    Sleep apnea    had roux en y lost 140lbs no longer needs cpap   Past Surgical History:  Procedure Laterality Date   APPENDECTOMY  1977   CESAREAN SECTION  1989   one   CHOLECYSTECTOMY  1977   DILATION AND CURETTAGE OF UTERUS  1987 & 1988   REDUCTION MAMMAPLASTY Bilateral 1985   ROUX-EN-Y GASTRIC BYPASS  2014   TOTAL KNEE ARTHROPLASTY Right 07/21/2019   Procedure: TOTAL KNEE ARTHROPLASTY;  Surgeon: Ollen Gross, MD;  Location: [facility];  Service: Orthopedics;  Laterality: Right;    Patient Active Problem List   Diagnosis Date Noted   Recurrent UTI 06/26/2022   Family history of Parkinson disease 06/26/2022   Post-menopausal 02/17/2022   Urinary incontinence without sensory awareness 02/13/2022   Class 1 obesity due to excess  calories with serious comorbidity and body mass index (BMI) of 30.0 to 30.9 in adult 07/13/2021   Dyslipidemia, goal LDL below 70 07/13/2021   Type 2 diabetes mellitus with hyperglycemia, without long-term current use of insulin (HCC) 03/18/2021   OAB (overactive bladder) 03/07/2021   Mixed stress and urge urinary incontinence 03/07/2021   Urinary frequency 03/07/2021   Groin pain, right 08/03/2020   Memory changes 07/12/2020   Right hip pain 07/12/2020   Chronic right-sided low back pain without sciatica 07/12/2020   Numbness and tingling of right side of face 07/12/2020   Osteoporosis 07/12/2020   Pap smear abnormality of cervix/human papillomavirus (HPV) positive 07/12/2020   OA (osteoarthritis) of knee 07/21/2019   Hammer toe of left foot 07/23/2018   Epigastric pain 07/23/2018   Onychomycosis 07/23/2018   Bilateral primary osteoarthritis of knee 03/05/2018   Gout 03/05/2018   Chronic pain of both knees 03/05/2018   Status post laparoscopic sleeve gastrectomy 03/04/2018   Obesity 05/23/2010   LEG CRAMPS 07/17/2009   Constipation 06/21/2009   FATTY LIVER DISEASE 09/12/2007   Essential hypertension 05/16/2007   Arthropathy 05/16/2007  Diverticulosis of colon 10/18/2006   HYPERCHOLESTEROLEMIA 09/05/2006   Factor V deficiency (HCC) 09/05/2006    PCP: Tandy Gaw PA-C   REFERRING PROVIDER: Ollen Gross, MD  REFERRING DIAG: M79.18 (ICD-10-CM) - Right buttock pain  THERAPY DIAG:  Pain in right hip  Muscle weakness (generalized)  Unsteadiness on feet  Pain in left hip  Left knee pain, unspecified chronicity  Rationale for Evaluation and Treatment: Rehabilitation  ONSET DATE: 3-4 months ago or maybe more   SUBJECTIVE:   SUBJECTIVE STATEMENT: Amanda [name] returned to the clinic with a referral for L knee pain. She stated that this issue is more related to weakness - the L knee feels like it will give way sometimes with walking or when returning to standing from a  squatting type position. She is not sure if this issue is different from the issue she is having at  her L hip, which remains the bigger issue. She feels like the L head of the femur is popping in and out of place when she walks, and if she puts pressure into the back of her L hip while walking it seems to hep this sensation. Amanda [name] has a visit with Dr. Karie Schwalbe next week and she is hoping to be scheduled for L hip injections as when she had a similar problem on her R, injection therapy helped a lot.  EVAL: This started at least 3-4 months ago, its been awhile. My posterior and my hip are hurting, when I take my first few steps it feels like the ball of my hip is popping out but I know that's not the case. I have to hold it, once I get moving I can walk a couple of miles. I can only lift it but so far, it feels weak and grabby. No fall or injury that caused all of this. I'm in a Parkinsons study, my dad has parkinsons, I feel like when I first stand up I need a second to get going, I need a little bit to feel like I'm getting going. If I were to just get up and go I would fall, if I wait a second I'm OK.   PERTINENT HISTORY: OA, blood dyscrasia, CPAP dependence, DM, fatty liver, gout, hip pain, HTN, knee pain, obesity, recurrent UTI, roux-en-y bypass, TKA 2021 PAIN:  Are you having pain? Yes: NPRS scale: 5/10 Pain location: Lt posterolateral hip Pain description: weak, grabby Aggravating factors: transitions/starting a movement  Relieving factors: back brace, heat    PRECAUTIONS: None  RED FLAGS: None   WEIGHT BEARING RESTRICTIONS: No  FALLS:  Has patient fallen in last 6 months? No  LIVING ENVIRONMENT: Lives with: lives with their family Lives in: House/apartment Stairs: 1-2 to enter  Has following equipment at home: Single point cane, Environmental consultant - 2 wheeled, and bed side commode  OCCUPATION: retired   PLOF: Independent, Independent with basic ADLs, Independent with gait, and Independent with  transfers  PATIENT GOALS: feel steadier when I stand, get pain under control   NEXT MD VISIT: Dr. Despina Hick early-mid September 2024  OBJECTIVE:   FOTO SCORE:  11/09/2022: Current: 44%, Predicted: 56%  MUSCLE LENGTH:  Hip flexors Mod limitation per functional observation L HS/Piriformis mild limitation R HS/Piriformis Moderate limitation    PALPATION: R piriformis tender, R TFL sore as well   LOWER EXTREMITY ROM:     Passive  Right 10/03 Left 10/03  Hip flexion WNL WNL  Hip extension WNL WNL  Hip abduction    Hip adduction  Hip internal rotation WNL at 90 WNL at 90  Hip external rotation WNL at 90 WNL at 90  Knee flexion WNL WNL  Knee extension WNL WNL  Ankle dorsiflexion    Ankle plantarflexion    Ankle inversion    Ankle eversion     (Blank rows = not tested)   LOWER EXTREMITY MMT:  MMT Right eval Left eval Right 9/4 Left 9/4 Right 10/03 Left 10/03  Hip flexion 3- 3- 4 4-    Hip extension 2 3+ 3 3-    Hip abduction 3- 4 4 4-    Hip adduction        Hip internal rotation        Hip external rotation        Knee flexion 4- 4+ 4+ 4+ 4+ 4+  Knee extension 4 4+ 4+ 4+ 4+ 4+  Ankle dorsiflexion 5 5      Ankle plantarflexion        Ankle inversion        Ankle eversion         (Blank rows = not tested)   FUNCTIONAL TESTS:  EVAL: 5 times sit to stand: 20 seconds U UE on chair  Tandem stance 30 seconds B but shaky/wobbly, SLS RLE 3 seconds, L LE 10 seconds  5x STS (10/11/22): 14.51 seconds GAIT: Distance walked: in clinic distances  Assistive device utilized: None Level of assistance: Complete Independence Comments: slow and steady, proximal weakness especially in hip abductors, tends to favor R LE  BERG BALANCE TEST Sitting to Standing: 3.      Stands independently using hands Standing Unsupported: 4.      Stands safely for 2 minutes Sitting Unsupported: 4.     Sits for 2 minutes independently Standing to Sitting: 2.     Uses back of legs against chair  to control descent  Transfers: 3.     Transfers safely definite use of hands Standing with eyes closed: 4.     Stands safely for 10 seconds  Standing with feet together: 2.     Unable to hold for 30 seconds  Reaching forward with outstretched arm: 4.     Reaches forward 10 inches Retrieving object from the floor: 3.     Able to pick up with supervision Turning to look behind: 4.     Looks behind from both sides and weight shifts well Turning 360 degrees: 2.     Able to turn slowly, but safely Place alternate foot on stool: 1.     Completes >2 steps with minimal assist Standing with one foot in front: 3.     Independent foot ahead for 30 seconds Standing on one foot: 3.     Holds 5-10 seconds  Total Score: 42/56  TODAY'S TREATMENT:    [facility] Adult PT Treatment:                                                DATE: 11/09/2022 Therapeutic Exercise: R sidelying: Manual stretching of L quadratus lumborum Hooklying: Self soft tissue mobilization of the L deep hip rotators using a softball - added to home program, 5 min Pelvic expansion inhale, 2 x 10 Manual Therapy: R sidelying: Soft tissue mobilization to L quadratus lumborum, L deep hip rotators along insertion at sacrum  [facility] Adult PT Treatment:  DATE: 10/17/22 Therapeutic Exercise: Bridge 2 x 10  Clamshells 2 x 10; LLE Hooklying march 2 x 10  Hooklying resisted hip abduction green band 2 x 10  LAQ 2 x 10 @ 1.5 lbs  Updated HEP  Manual Therapy: STM Lt gluteals, piriformis, IT band     [facility] Adult PT Treatment:                                                DATE: 10/11/22 Therapeutic Exercise: Nustep L6 x 5 min 5x STS: 14.51 seconds MMT (see above) LEFS 47%  Neuromuscular re-ed: Berg balance test performed 42/56   [facility] Adult PT Treatment:                                                DATE: 10/03/22 Therapeutic Exercise: Hip add ball squeeze 3 sec hold 2 x 10 Hip abd green TB 3  sec hold 2 x 10 Seated hip flexion isometric 3 sec hold x 10 Manual Therapy: STM Lt glutes at sacral lateral border  Modalities: Moist heat x 10 min  [facility] Adult PT Treatment:                                                DATE: 10/02/2022 Therapeutic Exercise: Seated green PB roll out front x10 --> alt diagonals x5 Supine (pelvis offloaded with folded towel & hand placement): Small range LTR Hooklying hip add iso ball squeeze x10 Seated: Figure 4 piriformis stretch  Modalities: TENS L glute/piriformis + moist heat L glute & L ribs x 15 min   PATIENT EDUCATION:  Education details: Update HEP  Person educated: Patient Education method: Explanation, Demonstration, and Handouts Education comprehension: verbalized understanding and returned demonstration  HOME EXERCISE PROGRAM: Access Code: [email or web address] URL: [email or web address] Date: 11/09/2022 Prepared by: Edmonia Caprio  Program Notes Added (1) hooklying self soft tissue mobilization for L deep hip rotators using soft ball, (2) hooklying pelvic expansion inhales  Exercises - Beginner Bridge  - 1-2 x daily - 7 x weekly - 1 sets - 10 reps - 1 second  hold - Seated Hamstring Stretch  - 1-2 x daily - 7 x weekly - 1 sets - 3 reps - 30 seconds  hold - Tandem Stance  - 1-2 x daily - 7 x weekly - 1 sets - 3 reps - 30 seconds  hold - Clam with Resistance  - 1 x daily - 7 x weekly - 2 sets - 10 reps - Supine March  - 1 x daily - 7 x weekly - 2 sets - 10 reps - Hooklying Clamshell with Resistance  - 1 x daily - 7 x weekly - 2 sets - 10 reps - Seated Long Arc Quad  - 1 x daily - 7 x weekly - 2 sets - 10 reps  ASSESSMENT:  CLINICAL IMPRESSION: Patient returned to physical therapy for her first visit since 10/17/2022 with a new referral for L knee pain. L knee symptoms likely related to issues ongoing at the L hip for which she is currently under a plan of care. She  continues to have impairments of L hip pain and lower  extremity weakness. L hip range of motion was unrestricted. L hip symptoms were reproduced today with palpation of the L quadratus lumborum region and at the L deep hip rotators. Will continue this plan of care to further develop L lower extremity strength, while also introducing exercises to address the lumbar region which may be contributing to the overall presentation. Long term goals will be continued with addition of goals added today. Patient has an office visit with Orthopedics next week to explore injection therapy which may assist rehabilitation goals. Patient has been seen for 10 of 19 planned visits - extending plan of care dates for remaining 9 visits. Plan of care will be continued so long as patient is demonstrating improvement and care remains medically necessary.   OBJECTIVE IMPAIRMENTS: Abnormal gait, decreased activity tolerance, decreased balance, decreased mobility, difficulty walking, decreased strength, increased fascial restrictions, increased muscle spasms, impaired flexibility, obesity, and pain.     GOALS: Goals reviewed with patient? Yes  SHORT TERM GOALS: Target date: 09/21/2022   Will be compliant with appropriate progressive HEP  Baseline: Goal status: MET  2.  Flexibility impairments to have improved by 50% Baseline: 30% improvement Goal status: IN PROGRESS  3.  R buttock pain to be no more than 2/10 at worst  Baseline: 2/10 Goal status: MET  LONG TERM GOALS: Target date: 11/22/2022  MMT to have improved by at least 1 grade in all weak groups  Baseline:  Goal status: Met for Rt LE, IN PROGRESS for LT LE  2.  Will report resolution of unsteadiness/inability to move well with initial transitions, also will perform 5x STS in 15 seconds or less no UEs  Baseline: 14.51 with UE support Goal status: IN PROGRESS  3.  Will score at least 50/56 on Berg balance test  Baseline:  Goal status: IN PROGRESS  4.  LEFS score to improve by at least 10 points   Baseline: 50% (eval), 47% (10/11/22) Goal status: IN PROGRESS  5.  Pt will be able to walk household distance without AD Baseline:  Goal status: INITIAL  6.  Pt will sleep x 6 hours without waking due to pain Baseline:  Goal status: INITIAL  7. Patient will score at least a 56% on the FOTO outcome questionnare.  Baseline: 44% (11/09/2022)  Goal status: INITIAL  8. Patient will have no reproduction of L hip symptoms with palpation of the L quadratus lumborum. Baseline:  Goal status: INITIAL  PLAN:  PT FREQUENCY: 2x/week  PT DURATION: 6 weeks  PLANNED INTERVENTIONS: Therapeutic exercises, Therapeutic activity, Neuromuscular re-education, Balance training, Gait training, Patient/Family education, Self Care, Joint mobilization, Stair training, Aquatic Therapy, Dry Needling, Electrical stimulation, Spinal mobilization, Cryotherapy, Moist heat, Taping, Ultrasound, Ionotophoresis 4mg /ml Dexamethasone, Manual therapy, and Re-evaluation  PLAN FOR NEXT SESSION: soft tissue mobilization L deep hip rotators and L quadratus lumborum region, lumbar mobility/strengthening exercises, continue lower extremity strengthening and balance exercises  PHYSICAL THERAPY DISCHARGE SUMMARY  Visits from Start of Care: 9  Current functional level related to goals / functional outcomes: Decreased buttock and hip pain   Remaining deficits: See above   Education / Equipment: HEP   Patient agrees to discharge. Patient goals were partially met. Patient is being discharged due to not returning since the last visit.  Reggy Eye, PT,DPT11/18/2410:11 AM    Edmonia Caprio, PT, PhD, DPT 11/09/22 9:30 AM

## 2022-11-13 ENCOUNTER — Encounter: Payer: Self-pay | Admitting: Sports Medicine

## 2022-11-13 ENCOUNTER — Ambulatory Visit (INDEPENDENT_AMBULATORY_CARE_PROVIDER_SITE_OTHER): Payer: PPO | Admitting: Sports Medicine

## 2022-11-13 ENCOUNTER — Other Ambulatory Visit (INDEPENDENT_AMBULATORY_CARE_PROVIDER_SITE_OTHER): Payer: PPO

## 2022-11-13 DIAGNOSIS — M47816 Spondylosis without myelopathy or radiculopathy, lumbar region: Secondary | ICD-10-CM

## 2022-11-13 MED ORDER — ROLLATOR ULTRA-LIGHT MISC
0 refills | Status: DC
Start: 2022-11-13 — End: 2023-04-06

## 2022-11-13 MED ORDER — TRIAMCINOLONE ACETONIDE 40 MG/ML IJ SUSP
40.0000 mg | Freq: Once | INTRAMUSCULAR | Status: AC
Start: 2022-11-13 — End: 2022-11-13
  Administered 2022-11-13: 40 mg via INTRAMUSCULAR

## 2022-11-13 NOTE — Addendum Note (Signed)
Addended by: Carren Rang A on: 11/13/2022 03:17 PM   Modules accepted: Orders

## 2022-11-13 NOTE — Progress Notes (Signed)
    Procedures performed today:    Procedure: Real-time Ultrasound Guided injection of the left sacroiliac joint Device: Samsung HS60  Verbal informed consent obtained.  Time-out conducted.  Noted no overlying erythema, induration, or other signs of local infection.  Skin prepped in a sterile fashion.  Local anesthesia: Topical Ethyl chloride.  With sterile technique and under real time ultrasound guidance: Noted minimally arthritic joint, 1 cc Kenalog 40, 2 cc lidocaine, 2 cc bupivacaine injected easily Completed without difficulty  Advised to call if fevers/chills, erythema, induration, drainage, or persistent bleeding.  Images permanently stored and available for review in PACS.  Impression: Technically successful ultrasound guided injection.  Independent interpretation of notes and tests performed by another provider:   None.  Brief History, Exam, Impression, and Recommendations:    Lumbar spondylosis This is a pleasant 67 year old female, she has multifactorial axial and radicular low back pain, she had a right sided sacroiliac joint injection approximately 2 years ago that did really well. This did well for her axial pain but then she developed radicular symptoms over the anterior right thigh, ultimately MRI showed right L2 and right L3 nerve root compression, she did really well with a right L2-L3 and right L3-L4 transforaminal epidurals. She is not having recurrence of pain, left-sided, axial localized to the left sacroiliac joint. She denies any radicular symptomatology. She is tender to palpation at the sacroiliac joint, today we did a diagnostic and therapeutic left sacroiliac joint injection with ultrasound guidance. Return to see me in about 6 weeks, if insufficient improvement we will proceed with additional intervention likely in the form of an epidural.    ____________________________________________ Ihor Austin. Benjamin Stain, M.D., ABFM., CAQSM., AME. Primary Care and  Sports Medicine [facility]  Adjunct Professor of Family Medicine  Section of [facility] [employer]  Restaurant manager, fast food

## 2022-11-13 NOTE — Assessment & Plan Note (Signed)
This is a pleasant 67 year old female, she has multifactorial axial and radicular low back pain, she had a right sided sacroiliac joint injection approximately 2 years ago that did really well. This did well for her axial pain but then she developed radicular symptoms over the anterior right thigh, ultimately MRI showed right L2 and right L3 nerve root compression, she did really well with a right L2-L3 and right L3-L4 transforaminal epidurals. She is not having recurrence of pain, left-sided, axial localized to the left sacroiliac joint. She denies any radicular symptomatology. She is tender to palpation at the sacroiliac joint, today we did a diagnostic and therapeutic left sacroiliac joint injection with ultrasound guidance. Return to see me in about 6 weeks, if insufficient improvement we will proceed with additional intervention likely in the form of an epidural.

## 2022-11-20 ENCOUNTER — Other Ambulatory Visit (HOSPITAL_COMMUNITY): Payer: Self-pay

## 2022-11-20 ENCOUNTER — Other Ambulatory Visit: Payer: Self-pay

## 2022-11-23 ENCOUNTER — Telehealth: Payer: Self-pay | Admitting: Sports Medicine

## 2022-11-23 NOTE — Telephone Encounter (Signed)
Synetta Fail, I believe PAs go to [facility].

## 2022-11-23 NOTE — Telephone Encounter (Deleted)
I believe PAs have always gone to [place].

## 2022-11-23 NOTE — Telephone Encounter (Signed)
Dr. Karie Schwalbe this is for medical equipment PA's for medication go to [facility] not equipment

## 2022-11-23 NOTE — Telephone Encounter (Signed)
I just asked and Amanda [name] is the one I will forward it to sorry I didn't know who was suppose to get from now on I will know

## 2022-11-23 NOTE — Telephone Encounter (Signed)
Patient called about a PA for Rolator please submit to  [employer]  ID number [id number] Phone Number [phone]

## 2022-11-24 NOTE — Telephone Encounter (Signed)
Left message on patients voicemail updating her on PA status.

## 2022-11-24 NOTE — Telephone Encounter (Signed)
Rollator orders, Clinical notes, demographics and copies of insurance cards have been faxed to [facility] at [phone].

## 2022-11-24 NOTE — Telephone Encounter (Signed)
Contacted [employer] ( spoke with Misty Stanley)- Once orders are received, insurance will be checked for PA, Copay and then patient will be contacted to schedule delivery or pick up home Health equipment.

## 2022-11-24 NOTE — Telephone Encounter (Signed)
Patient has [employer] insurance,  Rx: Rollator CPT code: [phone],  PA is not required unless DME company charges >$500.

## 2022-12-14 ENCOUNTER — Other Ambulatory Visit: Payer: Self-pay

## 2022-12-14 ENCOUNTER — Other Ambulatory Visit: Payer: Self-pay | Admitting: Physician Assistant

## 2022-12-14 ENCOUNTER — Other Ambulatory Visit (HOSPITAL_COMMUNITY): Payer: Self-pay

## 2022-12-14 DIAGNOSIS — I10 Essential (primary) hypertension: Secondary | ICD-10-CM

## 2022-12-15 ENCOUNTER — Other Ambulatory Visit (HOSPITAL_COMMUNITY): Payer: Self-pay

## 2022-12-15 ENCOUNTER — Other Ambulatory Visit: Payer: Self-pay

## 2022-12-15 MED ORDER — SPIRONOLACTONE 25 MG PO TABS
25.0000 mg | ORAL_TABLET | Freq: Every day | ORAL | 0 refills | Status: DC
  Filled 2022-12-15: qty 90, 90d supply, fill #0

## 2022-12-16 ENCOUNTER — Other Ambulatory Visit (HOSPITAL_COMMUNITY): Payer: Self-pay

## 2022-12-18 ENCOUNTER — Other Ambulatory Visit: Payer: Self-pay

## 2022-12-18 ENCOUNTER — Encounter: Payer: Self-pay | Admitting: Pharmacist

## 2022-12-21 ENCOUNTER — Other Ambulatory Visit: Payer: Self-pay

## 2022-12-22 ENCOUNTER — Encounter: Payer: Self-pay | Admitting: Physician Assistant

## 2022-12-22 ENCOUNTER — Other Ambulatory Visit (HOSPITAL_COMMUNITY): Payer: Self-pay

## 2022-12-22 ENCOUNTER — Other Ambulatory Visit: Payer: Self-pay

## 2022-12-22 ENCOUNTER — Telehealth (INDEPENDENT_AMBULATORY_CARE_PROVIDER_SITE_OTHER): Payer: PPO | Admitting: Physician Assistant

## 2022-12-22 DIAGNOSIS — R251 Tremor, unspecified: Secondary | ICD-10-CM

## 2022-12-22 DIAGNOSIS — Z7984 Long term (current) use of oral hypoglycemic drugs: Secondary | ICD-10-CM

## 2022-12-22 DIAGNOSIS — Z820 Family history of epilepsy and other diseases of the nervous system: Secondary | ICD-10-CM | POA: Diagnosis not present

## 2022-12-22 DIAGNOSIS — R296 Repeated falls: Secondary | ICD-10-CM

## 2022-12-22 DIAGNOSIS — E1165 Type 2 diabetes mellitus with hyperglycemia: Secondary | ICD-10-CM | POA: Diagnosis not present

## 2022-12-22 DIAGNOSIS — R269 Unspecified abnormalities of gait and mobility: Secondary | ICD-10-CM | POA: Diagnosis not present

## 2022-12-22 MED ORDER — DAPAGLIFLOZIN PRO-METFORMIN ER 10-1000 MG PO TB24
1.0000 | ORAL_TABLET | Freq: Every day | ORAL | 1 refills | Status: DC
Start: 2022-12-22 — End: 2023-01-01
  Filled 2022-12-22: qty 30, 30d supply, fill #0

## 2022-12-22 NOTE — Progress Notes (Unsigned)
..Virtual Visit via Telephone Note  I connected with [name] on 12/25/22 at  2:20 PM EST by telephone and verified that I am speaking with the correct person using two identifiers.  Location: Patient: home Provider: clinic  .Marland KitchenParticipating in visit:  Patient: [name] Provider: Tandy Gaw PA-C Provider in training: Carlyle Dolly PA-S   I discussed the limitations, risks, security and privacy concerns of performing an evaluation and management service by telephone and the availability of in person appointments. I also discussed with the patient that there may be a patient responsible charge related to this service. The patient expressed understanding and agreed to proceed.   History of Present Illness: Pt is a 67 yo female who calls into the clinic to request a referral to neurology for evaluation.   Pt is in a research trial since April 2024 that is evaluating people at risk for parkinsons disease. Her father had parkinson's. They have done a full work up and requested that she seek evaluation locally.   She had a recent MRI done 11/5 results below.   12/12/2022 11:44 AM EST  Limited MRI for research purpose guidance only.  Mild age appropriate atrophy.Supratentorial white matter signal intensity changes are nonspecific, most likely chronic small vessel ischemic changes.  Pt does admit frequent falls. She has had 4 falls since August 2024. She has not been hurt. She notices her gait "looks funny". She does feel like she has to work to not "slide her feet". Typing has become harder and harder. She has noticed a tremor when she holds items.   Labs were also done and showed elevated sugar per patient but we cannot see labs. She has known T2DM and not on medication.   .. Active Ambulatory Problems    Diagnosis Date Noted   HYPERCHOLESTEROLEMIA 09/05/2006   Factor V deficiency (HCC) 09/05/2006   Essential hypertension 05/16/2007   Diverticulosis of colon 10/18/2006   Constipation  06/21/2009   FATTY LIVER DISEASE 09/12/2007   Arthropathy 05/16/2007   LEG CRAMPS 07/17/2009   Obesity 05/23/2010   Status post laparoscopic sleeve gastrectomy 03/04/2018   Bilateral primary osteoarthritis of knee 03/05/2018   Gout 03/05/2018   Chronic pain of both knees 03/05/2018   Hammer toe of left foot 07/23/2018   Epigastric pain 07/23/2018   Onychomycosis 07/23/2018   OA (osteoarthritis) of knee 07/21/2019   Memory changes 07/12/2020   Right hip pain 07/12/2020   Lumbar spondylosis 07/12/2020   Numbness and tingling of right side of face 07/12/2020   Osteoporosis 07/12/2020   Pap smear abnormality of cervix/human papillomavirus (HPV) positive 07/12/2020   Groin pain, right 08/03/2020   OAB (overactive bladder) 03/07/2021   Mixed stress and urge urinary incontinence 03/07/2021   Urinary frequency 03/07/2021   Type 2 diabetes mellitus with hyperglycemia, without long-term current use of insulin (HCC) 03/18/2021   Class 1 obesity due to excess calories with serious comorbidity and body mass index (BMI) of 30.0 to 30.9 in adult 07/13/2021   Dyslipidemia, goal LDL below 70 07/13/2021   Urinary incontinence without sensory awareness 02/13/2022   Post-menopausal 02/17/2022   Recurrent UTI 06/26/2022   Family history of Parkinson disease 06/26/2022   Gait abnormality 12/22/2022   Tremors of nervous system 12/25/2022   Frequent falls 12/25/2022   Resolved Ambulatory Problems    Diagnosis Date Noted   Acute cystitis 07/17/2009   Sleep apnea 10/25/2007   Acute pain of right thigh 08/03/2020   Past Medical History:  Diagnosis Date  Allergy    Arthritis    Blood dyscrasia    CPAP (continuous positive airway pressure) dependence    Diabetes mellitus without complication (HCC)    Fatty liver 4-08   Hip pain    Hypertension    Knee pain    Pneumonia      Assessment and Plan: .Marland KitchenRosio "[name]" was seen today for medical management of chronic issues.  Diagnoses and all  orders for this visit:  Gait abnormality -     Ambulatory referral to Neurology  Family history of Parkinson disease -     Ambulatory referral to Neurology  Tremors of nervous system -     Ambulatory referral to Neurology  Frequent falls -     Ambulatory referral to Neurology  Type 2 diabetes mellitus with hyperglycemia, without long-term current use of insulin (HCC) -     Dapagliflozin Pro-metFORMIN ER (XIGDUO XR) 11-998 MG TB24; Take 1 tablet by mouth daily with breakfast.   Referral placed for Dr. Arbutus Leas with neurology and movement disorders.   Discussed T2DM and how she has not followed up. Needs appt for A1C. Start xigduo now and schedule appt. Last A1C was above goal but 9months ago.  Discussed risk of not treating.  .. Lab Results  Component Value Date   HGBA1C 7.3 (H) 02/13/2022       Spent 20 minutes with patient on phone discussing plan.    Follow Up Instructions:    I discussed the assessment and treatment plan with the patient. The patient was provided an opportunity to ask questions and all were answered. The patient agreed with the plan and demonstrated an understanding of the instructions.   The patient was advised to call back or seek an in-person evaluation if the symptoms worsen or if the condition fails to improve as anticipated.   Tandy Gaw, PA-C

## 2022-12-23 ENCOUNTER — Other Ambulatory Visit (HOSPITAL_COMMUNITY): Payer: Self-pay

## 2022-12-25 ENCOUNTER — Other Ambulatory Visit (HOSPITAL_COMMUNITY): Payer: Self-pay

## 2022-12-25 DIAGNOSIS — R251 Tremor, unspecified: Secondary | ICD-10-CM | POA: Insufficient documentation

## 2022-12-25 DIAGNOSIS — R296 Repeated falls: Secondary | ICD-10-CM | POA: Insufficient documentation

## 2022-12-26 ENCOUNTER — Other Ambulatory Visit: Payer: Self-pay

## 2022-12-27 ENCOUNTER — Other Ambulatory Visit (HOSPITAL_COMMUNITY): Payer: Self-pay

## 2023-01-01 ENCOUNTER — Other Ambulatory Visit: Payer: Self-pay

## 2023-01-01 ENCOUNTER — Other Ambulatory Visit (HOSPITAL_COMMUNITY): Payer: Self-pay

## 2023-01-01 ENCOUNTER — Ambulatory Visit (INDEPENDENT_AMBULATORY_CARE_PROVIDER_SITE_OTHER): Payer: PPO | Admitting: Physician Assistant

## 2023-01-01 ENCOUNTER — Encounter: Payer: Self-pay | Admitting: Physician Assistant

## 2023-01-01 VITALS — BP 126/70 | HR 70 | Ht 67.0 in | Wt 209.0 lb

## 2023-01-01 DIAGNOSIS — R42 Dizziness and giddiness: Secondary | ICD-10-CM

## 2023-01-01 DIAGNOSIS — E1165 Type 2 diabetes mellitus with hyperglycemia: Secondary | ICD-10-CM

## 2023-01-01 DIAGNOSIS — R251 Tremor, unspecified: Secondary | ICD-10-CM | POA: Diagnosis not present

## 2023-01-01 DIAGNOSIS — R296 Repeated falls: Secondary | ICD-10-CM | POA: Diagnosis not present

## 2023-01-01 DIAGNOSIS — R269 Unspecified abnormalities of gait and mobility: Secondary | ICD-10-CM | POA: Diagnosis not present

## 2023-01-01 DIAGNOSIS — L209 Atopic dermatitis, unspecified: Secondary | ICD-10-CM | POA: Diagnosis not present

## 2023-01-01 DIAGNOSIS — R9082 White matter disease, unspecified: Secondary | ICD-10-CM | POA: Diagnosis not present

## 2023-01-01 DIAGNOSIS — Z820 Family history of epilepsy and other diseases of the nervous system: Secondary | ICD-10-CM

## 2023-01-01 MED ORDER — TRIAMCINOLONE ACETONIDE 0.1 % EX CREA
1.0000 | TOPICAL_CREAM | Freq: Two times a day (BID) | CUTANEOUS | 0 refills | Status: DC
  Filled 2023-01-01: qty 30, 15d supply, fill #0

## 2023-01-01 NOTE — Progress Notes (Addendum)
Established Patient Office Visit  Subjective   Patient ID: Amanda [name], female    DOB: [birth year]  Age: 67 y.o. MRN: [id number]  Chief Complaint  Patient presents with   Medical Management of Chronic Issues    Last A1c 7.3    HPI 67 yo female presents today for neurology referral. She has been in a Parkinson's disease research trial since April 2024 due to her father having PD. The researchers recommended neurology referral based on findings.  Patient reports frequent falls and gait imbalance. She also reports intention hand tremor but denies resting tremor.  She also reports dizziness starting in August 2024. She describes it as the room spins when she turns her head.   She has had many falls since the summer. No major injuries.   Patient is also worried about increased blood sugar and wants her A1c checked today. Her last A1c on 02/13/22 was 7.3. She is interested in starting Metformin.  Patient also reports pruritic lesions on her left hand.  .. Active Ambulatory Problems    Diagnosis Date Noted   HYPERCHOLESTEROLEMIA 09/05/2006   Factor V deficiency (HCC) 09/05/2006   Essential hypertension 05/16/2007   Diverticulosis of colon 10/18/2006   Constipation 06/21/2009   FATTY LIVER DISEASE 09/12/2007   Arthropathy 05/16/2007   LEG CRAMPS 07/17/2009   Obesity 05/23/2010   Status post laparoscopic sleeve gastrectomy 03/04/2018   Bilateral primary osteoarthritis of knee 03/05/2018   Gout 03/05/2018   Chronic pain of both knees 03/05/2018   Hammer toe of left foot 07/23/2018   Epigastric pain 07/23/2018   Onychomycosis 07/23/2018   OA (osteoarthritis) of knee 07/21/2019   Memory changes 07/12/2020   Right hip pain 07/12/2020   Lumbar spondylosis 07/12/2020   Numbness and tingling of right side of face 07/12/2020   Osteoporosis 07/12/2020   Pap smear abnormality of cervix/human papillomavirus (HPV) positive 07/12/2020   Groin pain, right 08/03/2020   OAB (overactive  bladder) 03/07/2021   Mixed stress and urge urinary incontinence 03/07/2021   Urinary frequency 03/07/2021   Type 2 diabetes mellitus with hyperglycemia, without long-term current use of insulin (HCC) 03/18/2021   Class 1 obesity due to excess calories with serious comorbidity and body mass index (BMI) of 30.0 to 30.9 in adult 07/13/2021   Dyslipidemia, goal LDL below 70 07/13/2021   Urinary incontinence without sensory awareness 02/13/2022   Post-menopausal 02/17/2022   Recurrent UTI 06/26/2022   Family history of Parkinson disease 06/26/2022   Gait abnormality 12/22/2022   Tremors of nervous system 12/25/2022   Frequent falls 12/25/2022   White matter abnormality on MRI of brain 01/01/2023   Atopic dermatitis 01/01/2023   Resolved Ambulatory Problems    Diagnosis Date Noted   Acute cystitis 07/17/2009   Sleep apnea 10/25/2007   Acute pain of right thigh 08/03/2020   Past Medical History:  Diagnosis Date   Allergy    Arthritis    Blood dyscrasia    CPAP (continuous positive airway pressure) dependence    Diabetes mellitus without complication (HCC)    Fatty liver 4-08   Hip pain    Hypertension    Knee pain    Pneumonia      Review of Systems  Respiratory:  Negative for shortness of breath.   Musculoskeletal:  Positive for falls.  Neurological:  Positive for dizziness and tremors.  Psychiatric/Behavioral:  Negative for memory loss.       Objective:     BP 126/70 (BP Location: Left  Arm, Patient Position: Sitting, Cuff Size: Large)   Pulse 70   Ht 5\' 7"  (1.702 m)   Wt 209 lb (94.8 kg)   SpO2 97%   BMI 32.73 kg/m    Physical Exam HENT:     Head: Normocephalic.     Right Ear: A middle ear effusion is present.     Left Ear: A middle ear effusion is present.     Ears:     Comments: Gilberto Better attempted, but limited by mobility; dizziness reported but no nystagmus noted. Cardiovascular:     Rate and Rhythm: Normal rate and regular rhythm.     Pulses:  Normal pulses.     Heart sounds: Murmur heard.  Pulmonary:     Effort: Pulmonary effort is normal.     Breath sounds: Normal breath sounds.  Musculoskeletal:     Right lower leg: No edema.     Left lower leg: No edema.     Comments: Normal strength in UEs and LEs  Skin:    Findings: Lesion (scaly, erythematous lesions located over last two MCPs of left hand. Pruritus present.) present.  Neurological:     Mental Status: She is alert and oriented to person, place, and time.     Motor: No tremor (no resting tremor).     Gait: Gait abnormal (unbalanced).     Comments: Abnormal dixhallpike but without nystagmus.   Psychiatric:        Mood and Affect: Mood normal.       Assessment & Plan:  .Marland KitchenSiearra "[name]" was seen today for medical management of chronic issues.  Diagnoses and all orders for this visit:  Vertigo -     Hemoglobin A1c -     CMP14+EGFR -     TSH -     CBC w/Diff/Platelet -     Ambulatory referral to Physical Therapy  Frequent falls -     Hemoglobin A1c -     CMP14+EGFR -     TSH -     CBC w/Diff/Platelet -     Ambulatory referral to Physical Therapy -     Ambulatory referral to Neurology  Gait abnormality -     Hemoglobin A1c -     CMP14+EGFR -     TSH -     CBC w/Diff/Platelet -     Ambulatory referral to Neurology  Type 2 diabetes mellitus with hyperglycemia, without long-term current use of insulin (HCC) -     Hemoglobin A1c -     CMP14+EGFR  Tremors of nervous system -     Hemoglobin A1c -     CMP14+EGFR -     TSH -     CBC w/Diff/Platelet -     Ambulatory referral to Neurology  Family history of Parkinson disease -     Ambulatory referral to Neurology  White matter abnormality on MRI of brain -     Ambulatory referral to Neurology  Atopic dermatitis, unspecified type  Other orders -     triamcinolone cream (KENALOG) 0.1 %; Apply 1 Application topically 2 (two) times daily.   Neurology referral placed for gait abnormality and tremor. As  well as white matter brain changes and family hx of parkinsons.   Gilberto Better attempted, but limited by mobility; dizziness reported but no nystagmus noted. Patient would most likely benefit from vestibular rehabilitation. Flonase recommended for middle ear effusion.  A1c results pending. If A1c in diabetic range, start Metformin. She declined xigduo due  to cost Discussed diet changes  Topical steroid for left hand lesions.  Pt on a statin Pt declined pneumococcal vaccine today.  Return in about 3 months (around 04/03/2023), or if symptoms worsen or fail to improve.    Tandy Gaw, PA-C

## 2023-01-01 NOTE — Patient Instructions (Signed)
Will order vestibular rehab Start flonase regularly Will get labs today Will make referral to neurology

## 2023-01-01 NOTE — Addendum Note (Signed)
Addended by: Jomarie Longs on: 01/01/2023 04:38 PM   Modules accepted: Orders

## 2023-01-02 ENCOUNTER — Encounter: Payer: Self-pay | Admitting: Neurology

## 2023-01-02 LAB — CMP14+EGFR
ALT: 19 [IU]/L (ref 0–32)
AST: 19 [IU]/L (ref 0–40)
Albumin: 3.7 g/dL — ABNORMAL LOW (ref 3.9–4.9)
Alkaline Phosphatase: 86 [IU]/L (ref 44–121)
BUN/Creatinine Ratio: 15 (ref 12–28)
BUN: 16 mg/dL (ref 8–27)
Bilirubin Total: 0.6 mg/dL (ref 0.0–1.2)
CO2: 24 mmol/L (ref 20–29)
Calcium: 8.9 mg/dL (ref 8.7–10.3)
Chloride: 103 mmol/L (ref 96–106)
Creatinine, Ser: 1.05 mg/dL — ABNORMAL HIGH (ref 0.57–1.00)
Globulin, Total: 2.5 g/dL (ref 1.5–4.5)
Glucose: 267 mg/dL — ABNORMAL HIGH (ref 70–99)
Potassium: 4.1 mmol/L (ref 3.5–5.2)
Sodium: 141 mmol/L (ref 134–144)
Total Protein: 6.2 g/dL (ref 6.0–8.5)
eGFR: 59 mL/min/{1.73_m2} — ABNORMAL LOW (ref >59)

## 2023-01-02 LAB — CBC WITH DIFFERENTIAL/PLATELET
Basophils Absolute: 0 10*3/uL (ref 0.0–0.2)
Basos: 0 %
EOS (ABSOLUTE): 0.1 10*3/uL (ref 0.0–0.4)
Eos: 1 %
Hematocrit: 38 % (ref 34.0–46.6)
Hemoglobin: 12 g/dL (ref 11.1–15.9)
Immature Grans (Abs): 0 10*3/uL (ref 0.0–0.1)
Immature Granulocytes: 0 %
Lymphocytes Absolute: 2.8 10*3/uL (ref 0.7–3.1)
Lymphs: 29 %
MCH: 32.3 pg (ref 26.6–33.0)
MCHC: 31.6 g/dL (ref 31.5–35.7)
MCV: 102 fL — ABNORMAL HIGH (ref 79–97)
Monocytes Absolute: 0.5 10*3/uL (ref 0.1–0.9)
Monocytes: 5 %
Neutrophils Absolute: 5.9 10*3/uL (ref 1.4–7.0)
Neutrophils: 65 %
Platelets: 148 10*3/uL — ABNORMAL LOW (ref 150–450)
RBC: 3.72 x10E6/uL — ABNORMAL LOW (ref 3.77–5.28)
RDW: 13.2 % (ref 11.7–15.4)
WBC: 9.4 10*3/uL (ref 3.4–10.8)

## 2023-01-02 LAB — TSH: TSH: 1.47 u[IU]/mL (ref 0.450–4.500)

## 2023-01-02 LAB — HEMOGLOBIN A1C
Est. average glucose Bld gHb Est-mCnc: 235 mg/dL
Hgb A1c MFr Bld: 9.8 % — ABNORMAL HIGH (ref 4.8–5.6)

## 2023-01-02 NOTE — Progress Notes (Signed)
A1C is 9.8.  I feel like we need to be more aggressive than just metformin. I know insurance and cost is an issue. What if we gave you samples of a GLP-1 injectable until the new year when cost should be more affordable?   Kidney function down a bit as well and could be due to the higher sugars.  Albumin low. Increase protein in diet.  Thyroid looks good.

## 2023-01-03 ENCOUNTER — Other Ambulatory Visit: Payer: Self-pay

## 2023-01-03 ENCOUNTER — Encounter: Payer: Self-pay | Admitting: Physician Assistant

## 2023-01-03 DIAGNOSIS — E1165 Type 2 diabetes mellitus with hyperglycemia: Secondary | ICD-10-CM

## 2023-01-08 ENCOUNTER — Other Ambulatory Visit: Payer: Self-pay

## 2023-01-08 ENCOUNTER — Other Ambulatory Visit (HOSPITAL_COMMUNITY): Payer: Self-pay

## 2023-01-08 MED ORDER — METFORMIN HCL 1000 MG PO TABS
1000.0000 mg | ORAL_TABLET | Freq: Two times a day (BID) | ORAL | 3 refills | Status: DC
  Filled 2023-01-08: qty 180, 90d supply, fill #0
  Filled 2023-01-22 – 2023-04-12 (×2): qty 180, 90d supply, fill #1

## 2023-01-09 ENCOUNTER — Other Ambulatory Visit (HOSPITAL_COMMUNITY): Payer: Self-pay

## 2023-01-09 NOTE — Progress Notes (Unsigned)
Assessment/Plan:   Family history of Parkinsons  -Explained to the patient that 85% of Parkinsons is not inherited.  She was apparently in Parkinsons disease research trial (? Parkinsons Disease [employer]) and told to f/u with neurology. ***  Uncontrolled diabetes  -Patient's recent A1c was 9.8.  This could certainly be responsible for gait instability and falls, as I suspect that she also has associated neuropathy.  This also could cause tremor, given glucose fluctuations.  Subjective:   Amanda [name] was seen today in the movement disorders clinic for neurologic consultation at the request of Jomarie Longs, PA-C.  The consultation is for the evaluation of Parkinsons.  Patient reports that she was in a research trial for Parkinson's disease because of a family history of Parkinson's disease in her father.  I have no notes regarding the research trial.  Nonetheless, the researchers told her that I recommended a neurology referral based on their findings (unknown what those findings were).  She does admit to some falls and gait instability.   Specific Symptoms:  Tremor: Yes.  ,  No rest tremor.  Reports intention tremor.  Had recent hemoglobin A1c that demonstrated significantly uncontrolled diabetes.  A1c was 9.8. Family hx of similar:  {yes no:314532} Voice: *** Sleep: ***  Vivid Dreams:  {yes no:314532}  Acting out dreams:  {yes no:314532} Wet Pillows: {yes no:314532} Postural symptoms:  {yes no:314532}  Falls?  {yes no:314532} Bradykinesia symptoms: {parkinson brady:18041} Loss of smell:  {yes no:314532} Loss of taste:  {yes no:314532} Urinary Incontinence:  {yes no:314532} Difficulty Swallowing:  {yes no:314532} Handwriting, micrographia: {yes no:314532} Trouble with ADL's:  {yes no:314532}  Trouble buttoning clothing: {yes no:314532} Depression:  {yes no:314532} Memory changes:  {yes no:314532} Hallucinations:  {yes no:314532}  visual distortions: {yes  no:314532} N/V:  {yes no:314532} Lightheaded:  {yes no:314532}  Syncope: {yes no:314532} Diplopia:  {yes no:314532} Dyskinesia:  {yes no:314532} Prior exposure to reglan/antipsychotics: {yes no:314532}  Neuroimaging of the brain has *** previously been performed.  CT brain was done in April, 2023 and intracranially was unremarkable.  PREVIOUS MEDICATIONS: {Parkinson's RX:18200}  ALLERGIES:   Allergies  Allergen Reactions   Fenofibrate Other (See Comments)    Constipation   Morphine Nausea And Vomiting    Reports she is not allergic.    Penicillins      Reports she is not allergic.    Sulfonamide Derivatives     Reports she is not allergic.    Mounjaro [Tirzepatide]     Increase in appetite and cravings.    Ozempic (0.25 Or 0.5 Mg-Dose) [Semaglutide(0.25 Or 0.5mg -Dos)]     nausea    CURRENT MEDICATIONS:  No outpatient medications have been marked as taking for the 01/11/23 encounter (Appointment) with Thomasenia Dowse, Octaviano Batty, DO.     Objective:   VITALS:  There were no vitals filed for this visit.  GEN:  The patient appears stated age and is in NAD. HEENT:  Normocephalic, atraumatic.  The mucous membranes are moist. The superficial temporal arteries are without ropiness or tenderness. CV:  RRR Lungs:  CTAB Neck/HEME:  There are no carotid bruits bilaterally.  Neurological examination:  Orientation: The patient is alert and oriented x3.  Cranial nerves: There is good facial symmetry. Extraocular muscles are intact. The visual fields are full to confrontational testing. The speech is fluent and clear. Soft palate rises symmetrically and there is no tongue deviation. Hearing is intact to conversational tone. Sensation: Sensation is intact to light and pinprick throughout (  facial, trunk, extremities). Vibration is intact at the bilateral big toe. There is no extinction with double simultaneous stimulation. There is no sensory dermatomal level identified. Motor: Strength is 5/5 in  the bilateral upper and lower extremities.   Shoulder shrug is equal and symmetric.  There is no pronator drift. Deep tendon reflexes: Deep tendon reflexes are 2/4 at the bilateral biceps, triceps, brachioradialis, patella and achilles. Plantar responses are downgoing bilaterally.  Movement examination: Tone: There is ***tone in the bilateral upper extremities.  The tone in the lower extremities is ***.  Abnormal movements: *** Coordination:  There is *** decremation with RAM's, *** Gait and Station: The patient has *** difficulty arising out of a deep-seated chair without the use of the hands. The patient's stride length is ***.  The patient has a *** pull test.     I have reviewed and interpreted the following labs independently   Chemistry      Component Value Date/Time   NA 141 01/01/2023 0921   K 4.1 01/01/2023 0921   CL 103 01/01/2023 0921   CO2 24 01/01/2023 0921   BUN 16 01/01/2023 0921   CREATININE 1.05 (H) 01/01/2023 0921   CREATININE 0.93 02/13/2022 1032      Component Value Date/Time   CALCIUM 8.9 01/01/2023 0921   ALKPHOS 86 01/01/2023 0921   AST 19 01/01/2023 0921   ALT 19 01/01/2023 0921   BILITOT 0.6 01/01/2023 0921      Lab Results  Component Value Date   TSH 1.470 01/01/2023   Lab Results  Component Value Date   WBC 9.4 01/01/2023   HGB 12.0 01/01/2023   HCT 38.0 01/01/2023   MCV 102 (H) 01/01/2023   PLT 148 (L) 01/01/2023   Lab Results  Component Value Date   HGBA1C 9.8 (H) 01/01/2023     Total time spent on today's visit was ***60 minutes, including both face-to-face time and nonface-to-face time.  Time included that spent on review of records (prior notes available to me/labs/imaging if pertinent), discussing treatment and goals, answering patient's questions and coordinating care.  Cc:  Jomarie Longs, PA-C

## 2023-01-09 NOTE — Telephone Encounter (Signed)
I sent a message to a nurse for ozempic samples to take .25mg  weekly for 4 weeks then increase to .5mg  weekly and in the new year we would send rx.

## 2023-01-10 ENCOUNTER — Ambulatory Visit: Payer: PPO | Attending: Physician Assistant | Admitting: Physical Therapy

## 2023-01-10 ENCOUNTER — Other Ambulatory Visit: Payer: Self-pay

## 2023-01-10 ENCOUNTER — Encounter: Payer: Self-pay | Admitting: Physical Therapy

## 2023-01-10 DIAGNOSIS — R2689 Other abnormalities of gait and mobility: Secondary | ICD-10-CM | POA: Insufficient documentation

## 2023-01-10 DIAGNOSIS — R296 Repeated falls: Secondary | ICD-10-CM | POA: Diagnosis not present

## 2023-01-10 DIAGNOSIS — R42 Dizziness and giddiness: Secondary | ICD-10-CM | POA: Insufficient documentation

## 2023-01-10 NOTE — Therapy (Signed)
OUTPATIENT PHYSICAL THERAPY VESTIBULAR EVALUATION     Patient Name: Amanda [name] MRN: [id number] DOB:August 07, 1955, 67 y.o., female Today's Date: 01/10/2023  END OF SESSION:  PT End of Session - 01/10/23 1214     Visit Number 1    Number of Visits 8    Date for PT Re-Evaluation 03/07/23    Authorization Type [employer]    Authorization - Visit Number 1    Progress Note Due on Visit 10    PT Start Time 0845    PT Stop Time 0930    PT Time Calculation (min) 45 min    Activity Tolerance Patient tolerated treatment well    Behavior During Therapy WFL for tasks assessed/performed             Past Medical History:  Diagnosis Date   Allergy    seasonal   Arthritis    all over   Blood dyscrasia    Carrys trait for Leiden Factor five never had any issues . Yhe only reason she was tested was because her mother had it   CPAP (continuous positive airway pressure) dependence    8 cm water w/ heated humidifier   Diabetes mellitus without complication (HCC)    Pre surgery - Gastric sx 2014 Roun-Y no diabetes since  no meds   Fatty liver 4-08   by [place]   Gout    Hip pain    Hypertension    Knee pain    Obesity    Pneumonia    Recurrent UTI    Sleep apnea    had roux en y lost 140lbs no longer needs cpap   Past Surgical History:  Procedure Laterality Date   APPENDECTOMY  1977   CESAREAN SECTION  1989   one   CHOLECYSTECTOMY  1977   DILATION AND CURETTAGE OF UTERUS  1987 & 1988   REDUCTION MAMMAPLASTY Bilateral 1985   ROUX-EN-Y GASTRIC BYPASS  2014   TOTAL KNEE ARTHROPLASTY Right 07/21/2019   Procedure: TOTAL KNEE ARTHROPLASTY;  Surgeon: Ollen Gross, MD;  Location: [facility];  Service: Orthopedics;  Laterality: Right;    Patient Active Problem List   Diagnosis Date Noted   White matter abnormality on MRI of brain 01/01/2023   Atopic dermatitis 01/01/2023   Tremors of nervous system 12/25/2022   Frequent falls 12/25/2022   Gait abnormality  12/22/2022   Recurrent UTI 06/26/2022   Family history of Parkinson disease 06/26/2022   Post-menopausal 02/17/2022   Urinary incontinence without sensory awareness 02/13/2022   Class 1 obesity due to excess calories with serious comorbidity and body mass index (BMI) of 30.0 to 30.9 in adult 07/13/2021   Dyslipidemia, goal LDL below 70 07/13/2021   Type 2 diabetes mellitus with hyperglycemia, without long-term current use of insulin (HCC) 03/18/2021   OAB (overactive bladder) 03/07/2021   Mixed stress and urge urinary incontinence 03/07/2021   Urinary frequency 03/07/2021   Groin pain, right 08/03/2020   Memory changes 07/12/2020   Right hip pain 07/12/2020   Lumbar spondylosis 07/12/2020   Numbness and tingling of right side of face 07/12/2020   Osteoporosis 07/12/2020   Pap smear abnormality of cervix/human papillomavirus (HPV) positive 07/12/2020   OA (osteoarthritis) of knee 07/21/2019   Hammer toe of left foot 07/23/2018   Epigastric pain 07/23/2018   Onychomycosis 07/23/2018   Bilateral primary osteoarthritis of knee 03/05/2018   Gout 03/05/2018   Chronic pain of both knees 03/05/2018   Status post laparoscopic sleeve  gastrectomy 03/04/2018   Obesity 05/23/2010   LEG CRAMPS 07/17/2009   Constipation 06/21/2009   FATTY LIVER DISEASE 09/12/2007   Essential hypertension 05/16/2007   Arthropathy 05/16/2007   Diverticulosis of colon 10/18/2006   HYPERCHOLESTEROLEMIA 09/05/2006   Factor V deficiency (HCC) 09/05/2006    PCP: Caleen Essex REFERRING PROVIDER: Caleen Essex  REFERRING DIAG: frequent falls, vertigo  THERAPY DIAG:  Dizziness and giddiness  Balance disorder  ONSET DATE: MD visit 12/2022  Rationale for Evaluation and Treatment: Rehabilitation  SUBJECTIVE:   SUBJECTIVE STATEMENT: Pt states she has had dizziness which she believes is causing her falls. She states she has had 3-4 falls recently, no injury but pt is not able to get up from the ground by herself. She  feels most dizzy with looking down and with quick head turns. Pt states she does use a rollator for times she has to look down like when she is loading the dishwasher Pt accompanied by: self  PERTINENT HISTORY: possible parkinson's disease, history of hip and back pain  PAIN:  Are you having pain? No  PRECAUTIONS: Fall  RED FLAGS: None   WEIGHT BEARING RESTRICTIONS: No  FALLS: Has patient fallen in last 6 months? Yes. Number of falls 4  PLOF: Independent  PATIENT GOALS: decrease dizziness, decrease falls  OBJECTIVE:  Note: Objective measures were completed at Evaluation unless otherwise noted.   GAIT: Gait pattern: decreased stride length and lateral hip instability Distance walked: 100' Assistive device utilized: None Level of assistance: Complete Independence   VESTIBULAR ASSESSMENT:    SYMPTOM BEHAVIOR:  Subjective history: see above  Non-Vestibular symptoms:  none reported  Type of dizziness: Spinning/Vertigo, Lightheadedness/Faint, and "World moves"  Frequency: daily to 3 x a week  Duration: seconds  Aggravating factors: Induced by motion: bending down to the ground  Relieving factors: head stationary  Progression of symptoms: unchanged  OCULOMOTOR EXAM:  Ocular Alignment: normal  Ocular ROM: No Limitations  Spontaneous Nystagmus: absent  Gaze-Induced Nystagmus: age appropriate nystagmus at end range - dizziness with looking up/left  Smooth Pursuits: intact  Saccades: intact - increased symptoms    VESTIBULAR - OCULAR REFLEX:   Slow VOR: Positive Bilaterally  VOR Cancellation: Normal  Head-Impulse Test: HIT Right: negative HIT Left: negative     POSITIONAL TESTING: Right Dix-Hallpike: no nystagmus Left Dix-Hallpike: no nystagmus  MOTION SENSITIVITY:  Motion Sensitivity Quotient Intensity: 0 = none, 1 = Lightheaded, 2 = Mild, 3 = Moderate, 4 = Severe, 5 = Vomiting  Intensity  1. Sitting to supine   2. Supine to L side   3. Supine to R side    4. Supine to sitting   5. L Hallpike-Dix 0  6. Up from L  0  7. R Hallpike-Dix 0  8. Up from R  0  9. Sitting, head tipped to L knee   10. Head up from L knee   11. Sitting, head tipped to R knee   12. Head up from R knee   13. Sitting head turns x5 2  14.Sitting head nods x5 1  15. In stance, 180 turn to L    16. In stance, 180 turn to R       VESTIBULAR TREATMENT:  DATE: 01/10/23  Gaze Adaptation:  x1 Viewing Horizontal: Position: seated and Reps: 10 and x1 Viewing Vertical:  Position: seated and Reps: 10 Habituation:  Other: saccades horizontal x 10  PATIENT EDUCATION: Education details: PT POC and goals, HEP Person educated: Patient Education method: Explanation, Demonstration, and Handouts Education comprehension: verbalized understanding and returned demonstration  HOME EXERCISE PROGRAM: Access Code: [email or web address] URL: [email or web address] Date: 01/10/2023 Prepared by: Reggy Eye  Exercises - Seated Gaze Stabilization with Head Nod  - 1 x daily - 7 x weekly - 3 sets - 10 reps - Seated Gaze Stabilization with Head Rotation  - 1 x daily - 7 x weekly - 3 sets - 10 reps - Seated Horizontal Saccades  - 1 x daily - 7 x weekly - 3 sets - 10 reps  GOALS: Goals reviewed with patient? Yes  SHORT TERM GOALS: Target date: 02/07/2023    Pt will be independent in initial HEP Baseline: Goal status: INITIAL  2.  Pt will unload dishwasher with dizziness <= 2/10 Baseline:  Goal status: INITIAL  LONG TERM GOALS: Target date: 03/07/2023    Pt will be independent with advanced HEP Baseline:  Goal status: INITIAL  2.  Pt will tolerate unloading dishwasher without increase in symptoms Baseline:  Goal status: INITIAL  3.  Pt will perform floor transfer to get up herself after a fall Baseline:  Goal status: INITIAL    ASSESSMENT:  CLINICAL  IMPRESSION: Patient is a 67 y.o. female who was seen today for physical therapy evaluation and treatment for vertigo and frequent falls. Pt presents with increased symptoms with saccades, VOR x 1 horizontal and vertically and with seated head turns and nods. She reports frequent falls and that she is unable to get up from a fall without assistance. She will benefit from skilled PT to address deficits and decreased dizziness and decrease risk of falls.  OBJECTIVE IMPAIRMENTS: decreased activity tolerance, decreased balance, and dizziness.   ACTIVITY LIMITATIONS: transfers  PARTICIPATION LIMITATIONS: cleaning and community activity  PERSONAL FACTORS: Age and Time since onset of injury/illness/exacerbation are also affecting patient's functional outcome.   REHAB POTENTIAL: Good  CLINICAL DECISION MAKING: Evolving/moderate complexity  EVALUATION COMPLEXITY: Moderate   PLAN:  PT FREQUENCY: 1x/week  PT DURATION: 8 weeks  PLANNED INTERVENTIONS: 97164- PT Re-evaluation, 97110-Therapeutic exercises, 97530- Therapeutic activity, 97112- Neuromuscular re-education, 97535- Self Care, 95284- Manual therapy, 95992- Canalith repositioning, Balance training, and Vestibular training  PLAN FOR NEXT SESSION: assess balance and add to HEP, assess response to vestibular HEP   Devell Parkerson, PT 01/10/2023, 12:15 PM

## 2023-01-11 ENCOUNTER — Encounter: Payer: Self-pay | Admitting: Neurology

## 2023-01-11 ENCOUNTER — Ambulatory Visit: Payer: PPO | Admitting: Neurology

## 2023-01-11 VITALS — BP 130/71 | HR 95 | Ht 67.0 in | Wt 205.8 lb

## 2023-01-11 DIAGNOSIS — E1165 Type 2 diabetes mellitus with hyperglycemia: Secondary | ICD-10-CM

## 2023-01-11 DIAGNOSIS — R251 Tremor, unspecified: Secondary | ICD-10-CM

## 2023-01-11 DIAGNOSIS — R2681 Unsteadiness on feet: Secondary | ICD-10-CM | POA: Diagnosis not present

## 2023-01-11 NOTE — Patient Instructions (Signed)
Good to see you today!  We discussed the Parkinsons Disease GeneRation study at the [employer].  We discussed the clinical criteria for Parkinsons Disease and you do not have Parkinsons Disease.  This is great news!    Altamese Cabal Christmas to you and your family!  The physicians and staff at [facility] are committed to providing excellent care. You may receive a survey requesting feedback about your experience at our office. We strive to receive "very good" responses to the survey questions. If you feel that your experience would prevent you from giving the office a "very good " response, please contact our office to try to remedy the situation. We may be reached at [phone]. Thank you for taking the time out of your busy day to complete the survey.

## 2023-01-12 ENCOUNTER — Other Ambulatory Visit: Payer: Self-pay

## 2023-01-22 ENCOUNTER — Other Ambulatory Visit (HOSPITAL_COMMUNITY): Payer: Self-pay

## 2023-01-22 ENCOUNTER — Other Ambulatory Visit: Payer: Self-pay | Admitting: Physician Assistant

## 2023-01-22 DIAGNOSIS — I10 Essential (primary) hypertension: Secondary | ICD-10-CM

## 2023-01-22 DIAGNOSIS — M810 Age-related osteoporosis without current pathological fracture: Secondary | ICD-10-CM

## 2023-01-22 DIAGNOSIS — M545 Low back pain, unspecified: Secondary | ICD-10-CM

## 2023-01-22 DIAGNOSIS — M25552 Pain in left hip: Secondary | ICD-10-CM

## 2023-01-23 ENCOUNTER — Other Ambulatory Visit: Payer: Self-pay

## 2023-01-23 ENCOUNTER — Other Ambulatory Visit (HOSPITAL_COMMUNITY): Payer: Self-pay

## 2023-01-23 NOTE — Addendum Note (Signed)
Addended by: Jomarie Longs on: 01/23/2023 03:40 PM   Modules accepted: Orders

## 2023-01-23 NOTE — Addendum Note (Signed)
Addended by: Chalmers Cater on: 01/23/2023 02:54 PM   Modules accepted: Orders

## 2023-01-24 ENCOUNTER — Encounter: Payer: PPO | Attending: Physician Assistant | Admitting: Dietician

## 2023-01-24 ENCOUNTER — Encounter: Payer: Self-pay | Admitting: Dietician

## 2023-01-24 VITALS — Wt 203.8 lb

## 2023-01-24 DIAGNOSIS — E1165 Type 2 diabetes mellitus with hyperglycemia: Secondary | ICD-10-CM | POA: Diagnosis not present

## 2023-01-24 NOTE — Patient Instructions (Signed)
Pt goals: Test blood sugar fasting in the morning and 2 hours after a meal. Try to incorporate some physical activity into your week. Take the trash/recycling can up to the end of the drive way. (Other ideas: Silver Sneakers, stationary bike...) Try to eat a balanced meal/snack every 3-5 hours for good blood sugar control.

## 2023-01-24 NOTE — Progress Notes (Signed)
Diabetes Self-Management Education  Visit Type: First/Initial  Appt. Start Time: 0810 Appt. End Time: 0910  01/24/2023  [name], identified by name and date of birth, is a 67 y.o. female with a diagnosis of Diabetes: Type 2.   ASSESSMENT  Weight 203 lb 12.8 oz (92.4 kg). Body mass index is 31.92 kg/m.  Primary concern: diabetes management and weight  History includes: allergies, arthritis, sleep apnea, HTN Labs noted: 01/01/23: A1c 9.8 Medications include: metformin, ozempic for 3 weeks Supplements: none reported  Pt states she is a retired Engineer, civil (consulting).   Pt states her fasting blood glucose is usually around 90-100. Pt states she has experienced weight fluctuations throughout her lifetime, and states she has weighed between 140 lbs and 300 lbs.   Pt had a Roux-en-Y in the past. Pt states she started taking ozempic 3 weeks ago and has been experiencing nausea mid-day since starting the medication.  Pt's husband has diabetes. Pt states it is hard to feel motivated to cook because they eat different kinds of food. Pt reports they eat out or order food in frequently for dinner.  Pt reports her dad had Parkinson's Disease and she is currently participating in a study about parkinson's disease. Pt states she has been experiencing confusion recently. Pt reports balance issues and 4-5 falls.   Pt reports her usual dietary intake before recent changes to her diet: Breakfast: cinnamon sugar toast, coffee and sweet creamer Lunch: chick-fila pimento spicy chicken sandwich Snack: chocolate candy Dinner: eating out: pizza, spaghetti, hamburger, ground beef and salad   Diabetes Self-Management Education - 01/24/23 0805       Visit Information   Visit Type First/Initial      Initial Visit   Diabetes Type Type 2    Are you taking your medications as prescribed? Yes      Health Coping   How would you rate your overall health? Good      Psychosocial Assessment   Patient  Belief/Attitude about Diabetes Motivated to manage diabetes    What is the hardest part about your diabetes right now, causing you the most concern, or is the most worrisome to you about your diabetes?   Making healty food and beverage choices;Being active    Self-care barriers Unsteady gait/risk for falls    Self-management support Doctor's office;Friends    Other persons present Patient    Patient Concerns Healthy Lifestyle;Nutrition/Meal planning;Monitoring    Special Needs None    Preferred Learning Style Hands on    Learning Readiness Ready    How often do you need to have someone help you when you read instructions, pamphlets, or other written materials from your doctor or pharmacy? 1 - Never    What is the last grade level you completed in school? Master's degree      Pre-Education Assessment   Patient understands the diabetes disease and treatment process. Needs Instruction    Patient understands incorporating nutritional management into lifestyle. Needs Instruction    Patient undertands incorporating physical activity into lifestyle. Needs Instruction    Patient understands using medications safely. Needs Instruction    Patient understands monitoring blood glucose, interpreting and using results Needs Instruction    Patient understands prevention, detection, and treatment of acute complications. Needs Instruction    Patient understands prevention, detection, and treatment of chronic complications. Needs Instruction    Patient understands how to develop strategies to address psychosocial issues. Needs Instruction    Patient understands how to develop strategies to promote  health/change behavior. Needs Instruction      Complications   Last HgB A1C per patient/outside source 9.8 %    How often do you check your blood sugar? 1-2 times/day    Fasting Blood glucose range (mg/dL) 13-086    Have you had a dilated eye exam in the past 12 months? Yes    Have you had a dental exam in the  past 12 months? Yes    Are you checking your feet? Yes    How many days per week are you checking your feet? 5      Dietary Intake   Breakfast [employer] everything bread with cream cheese, coffee w/ sugar free creamer    Snack (morning) none    Lunch NOTHING unless going out with a friend    Snack (afternoon) none    Psychologist, counselling (evening) none    Beverage(s) water, diet lipton green tea      Activity / Exercise   Activity / Exercise Type ADL's      Patient Education   Previous Diabetes Education Yes (please comment)   in nursing school   Disease Pathophysiology Explored patient's options for treatment of their diabetes    Healthy Eating Role of diet in the treatment of diabetes and the relationship between the three main macronutrients and blood glucose level;Plate Method;Reviewed blood glucose goals for pre and post meals and how to evaluate the patients' food intake on their blood glucose level.;Meal timing in regards to the patients' current diabetes medication.;Meal options for control of blood glucose level and chronic complications.;Information on hints to eating out and maintain blood glucose control.    Being Active Helped patient identify appropriate exercises in relation to his/her diabetes, diabetes complications and other health issue.    Medications Reviewed patients medication for diabetes, action, purpose, timing of dose and side effects.    Monitoring Purpose and frequency of SMBG.;Taught/discussed recording of test results and interpretation of SMBG.;Identified appropriate SMBG and/or A1C goals.    Acute complications Taught prevention, symptoms, and  treatment of hypoglycemia - the 15 rule.;Discussed and identified patients' prevention, symptoms, and treatment of hyperglycemia.    Diabetes Stress and Support Identified and addressed patients feelings and concerns about diabetes;Worked with patient to identify barriers to care and solutions    Lifestyle and  Health Coping Lifestyle issues that need to be addressed for better diabetes care      Individualized Goals (developed by patient)   Nutrition General guidelines for healthy choices and portions discussed    Physical Activity Exercise 1-2 times per week;15 minutes per day    Medications take my medication as prescribed    Monitoring  Test my blood glucose as discussed    Problem Solving Eating Pattern    Reducing Risk examine blood glucose patterns;do foot checks daily;treat hypoglycemia with 15 grams of carbs if blood glucose less than 70mg /dL    Health Coping Ask for help with psychological, social, or emotional issues      Post-Education Assessment   Patient understands the diabetes disease and treatment process. Comprehends key points    Patient understands incorporating nutritional management into lifestyle. Comprehends key points    Patient undertands incorporating physical activity into lifestyle. Comprehends key points    Patient understands using medications safely. Comphrehends key points    Patient understands monitoring blood glucose, interpreting and using results Comprehends key points    Patient understands prevention, detection, and treatment of acute complications. Comprehends key  points    Patient understands prevention, detection, and treatment of chronic complications. Needs Review    Patient understands how to develop strategies to address psychosocial issues. Comprehends key points    Patient understands how to develop strategies to promote health/change behavior. Comprehends key points      Outcomes   Expected Outcomes Demonstrated interest in learning. Expect positive outcomes    Future DMSE 3-4 months    Program Status Not Completed             Individualized Plan for Diabetes Self-Management Training:   Learning Objective:  Patient will have a greater understanding of diabetes self-management. Patient education plan is to attend individual and/or group  sessions per assessed needs and concerns.   Plan:   Patient Instructions  Pt goals: Test blood sugar fasting in the morning and 2 hours after a meal. Try to incorporate some physical activity into your week. Take the trash/recycling can up to the end of the drive way. (Other ideas: Silver Sneakers, stationary bike...) Try to eat a balanced meal/snack every 3-5 hours for good blood sugar control.  Expected Outcomes:  Demonstrated interest in learning. Expect positive outcomes  Education material provided: ADA - How to Thrive: A Guide for Your Journey with Diabetes and Snack sheet  If problems or questions, patient to contact team via:  Phone  Future DSME appointment: 3-4 months

## 2023-01-25 ENCOUNTER — Ambulatory Visit: Payer: PPO

## 2023-01-26 ENCOUNTER — Other Ambulatory Visit: Payer: Self-pay

## 2023-01-26 ENCOUNTER — Other Ambulatory Visit (HOSPITAL_COMMUNITY): Payer: Self-pay

## 2023-01-26 MED ORDER — SPIRONOLACTONE 25 MG PO TABS
25.0000 mg | ORAL_TABLET | Freq: Every day | ORAL | 0 refills | Status: DC
  Filled 2023-01-26 – 2023-04-25 (×2): qty 90, 90d supply, fill #0

## 2023-01-26 MED ORDER — ALENDRONATE SODIUM 70 MG PO TABS
70.0000 mg | ORAL_TABLET | ORAL | 1 refills | Status: DC
  Filled 2023-01-26 – 2023-02-28 (×2): qty 12, 84d supply, fill #0

## 2023-01-26 MED ORDER — GABAPENTIN 300 MG PO CAPS
300.0000 mg | ORAL_CAPSULE | Freq: Three times a day (TID) | ORAL | 1 refills | Status: DC
Start: 2023-01-26 — End: 2023-10-05
  Filled 2023-01-26: qty 270, 45d supply, fill #0

## 2023-02-03 ENCOUNTER — Other Ambulatory Visit (HOSPITAL_COMMUNITY): Payer: Self-pay

## 2023-02-05 ENCOUNTER — Other Ambulatory Visit (HOSPITAL_COMMUNITY): Payer: Self-pay

## 2023-02-06 ENCOUNTER — Encounter: Payer: Self-pay | Admitting: Physical Therapy

## 2023-02-06 ENCOUNTER — Ambulatory Visit: Payer: PPO | Admitting: Physical Therapy

## 2023-02-06 DIAGNOSIS — R42 Dizziness and giddiness: Secondary | ICD-10-CM

## 2023-02-06 DIAGNOSIS — R2689 Other abnormalities of gait and mobility: Secondary | ICD-10-CM

## 2023-02-06 NOTE — Therapy (Signed)
 OUTPATIENT PHYSICAL THERAPY VESTIBULAR TREATMENT     Patient Name: Amanda [name] MRN: [id number] DOB:[birth year], 67 y.o., female Today's Date: 02/06/2023  END OF SESSION:  PT End of Session - 02/06/23 1357     Visit Number 2    Number of Visits 8    Date for PT Re-Evaluation 03/07/23    Authorization Type [employer]    Authorization - Visit Number 2    Progress Note Due on Visit 10    PT Start Time 1315    PT Stop Time 1355    PT Time Calculation (min) 40 min    Activity Tolerance Patient tolerated treatment well    Behavior During Therapy WFL for tasks assessed/performed              Past Medical History:  Diagnosis Date   Allergy    seasonal   Arthritis    all over   Blood dyscrasia    Carrys trait for Leiden Factor five never had any issues . Yhe only reason she was tested was because her mother had it   Diabetes mellitus without complication (HCC)    Pre surgery - Gastric sx 2014 Roun-Y no diabetes since  no meds   Fatty liver 05/2006   by US    Gout    Hip pain    Hypertension    Knee pain    Obesity    Pneumonia    Recurrent UTI    Sleep apnea    had roux en y lost 140lbs no longer needs cpap   Past Surgical History:  Procedure Laterality Date   APPENDECTOMY  1977   CESAREAN SECTION  1989   one   CHOLECYSTECTOMY  1977   DILATION AND CURETTAGE OF UTERUS  1987 & 1988   REDUCTION MAMMAPLASTY Bilateral 1985   ROUX-EN-Y GASTRIC BYPASS  2014   TOTAL KNEE ARTHROPLASTY Right 07/21/2019   Procedure: TOTAL KNEE ARTHROPLASTY;  Surgeon: Melodi Lerner, MD;  Location: [facility];  Service: Orthopedics;  Laterality: Right;    Patient Active Problem List   Diagnosis Date Noted   White matter abnormality on MRI of brain 01/01/2023   Atopic dermatitis 01/01/2023   Tremors of nervous system 12/25/2022   Frequent falls 12/25/2022   Gait abnormality 12/22/2022   Recurrent UTI 06/26/2022   Family history of Parkinson disease 06/26/2022    Post-menopausal 02/17/2022   Urinary incontinence without sensory awareness 02/13/2022   Class 1 obesity due to excess calories with serious comorbidity and body mass index (BMI) of 30.0 to 30.9 in adult 07/13/2021   Dyslipidemia, goal LDL below 70 07/13/2021   Type 2 diabetes mellitus with hyperglycemia, without long-term current use of insulin  (HCC) 03/18/2021   OAB (overactive bladder) 03/07/2021   Mixed stress and urge urinary incontinence 03/07/2021   Urinary frequency 03/07/2021   Groin pain, right 08/03/2020   Memory changes 07/12/2020   Right hip pain 07/12/2020   Lumbar spondylosis 07/12/2020   Numbness and tingling of right side of face 07/12/2020   Osteoporosis 07/12/2020   Pap smear abnormality of cervix/human papillomavirus (HPV) positive 07/12/2020   OA (osteoarthritis) of knee 07/21/2019   Hammer toe of left foot 07/23/2018   Epigastric pain 07/23/2018   Onychomycosis 07/23/2018   Bilateral primary osteoarthritis of knee 03/05/2018   Gout 03/05/2018   Chronic pain of both knees 03/05/2018   Status post laparoscopic sleeve gastrectomy 03/04/2018   Obesity 05/23/2010   LEG CRAMPS 07/17/2009   Constipation 06/21/2009  FATTY LIVER DISEASE 09/12/2007   Essential hypertension 05/16/2007   Arthropathy 05/16/2007   Diverticulosis of colon 10/18/2006   HYPERCHOLESTEROLEMIA 09/05/2006   Factor V deficiency (HCC) 09/05/2006    PCP: Antoniette REFERRING PROVIDER: Antoniette  REFERRING DIAG: frequent falls, vertigo  THERAPY DIAG:  Dizziness and giddiness  Balance disorder  ONSET DATE: MD visit 12/2022  Rationale for Evaluation and Treatment: Rehabilitation  SUBJECTIVE:   SUBJECTIVE STATEMENT: Pt states she lost her balance and fell in a bush after stepping up a curb. She also almost fell yesterday after turning too fast in the grocery store.   PERTINENT HISTORY: possible parkinson's disease, history of hip and back pain  Pt states she has had dizziness which she  believes is causing her falls. She states she has had 3-4 falls recently, no injury but pt is not able to get up from the ground by herself. She feels most dizzy with looking down and with quick head turns. Pt states she does use a rollator for times she has to look down like when she is loading the dishwasher  PAIN:  Are you having pain? No  PRECAUTIONS: Fall  RED FLAGS: None   WEIGHT BEARING RESTRICTIONS: No  FALLS: Has patient fallen in last 6 months? Yes. Number of falls 4  PLOF: Independent  PATIENT GOALS: decrease dizziness, decrease falls  OBJECTIVE:  Note: Objective measures were completed at Evaluation unless otherwise noted.   GAIT: Gait pattern: decreased stride length and lateral hip instability Distance walked: 100' Assistive device utilized: None Level of assistance: Complete Independence   VESTIBULAR ASSESSMENT:    SYMPTOM BEHAVIOR:  Subjective history: see above  Non-Vestibular symptoms:  none reported  Type of dizziness: Spinning/Vertigo, Lightheadedness/Faint, and World moves  Frequency: daily to 3 x a week  Duration: seconds  Aggravating factors: Induced by motion: bending down to the ground  Relieving factors: head stationary  Progression of symptoms: unchanged  OCULOMOTOR EXAM:  Ocular Alignment: normal  Ocular ROM: No Limitations  Spontaneous Nystagmus: absent  Gaze-Induced Nystagmus: age appropriate nystagmus at end range - dizziness with looking up/left  Smooth Pursuits: intact  Saccades: intact - increased symptoms    VESTIBULAR - OCULAR REFLEX:   Slow VOR: Positive Bilaterally  VOR Cancellation: Normal  Head-Impulse Test: HIT Right: negative HIT Left: negative     POSITIONAL TESTING: Right Dix-Hallpike: no nystagmus Left Dix-Hallpike: no nystagmus  MOTION SENSITIVITY:  Motion Sensitivity Quotient Intensity: 0 = none, 1 = Lightheaded, 2 = Mild, 3 = Moderate, 4 = Severe, 5 = Vomiting  Intensity  1. Sitting to supine   2.  Supine to L side   3. Supine to R side   4. Supine to sitting   5. L Hallpike-Dix 0  6. Up from L  0  7. R Hallpike-Dix 0  8. Up from R  0  9. Sitting, head tipped to L knee   10. Head up from L knee   11. Sitting, head tipped to R knee   12. Head up from R knee   13. Sitting head turns x5 2  14.Sitting head nods x5 1  15. In stance, 180 turn to L    16. In stance, 180 turn to R       [facility] Adult PT Treatment:  DATE: 02/06/23 Therapeutic Exercise: STS from elevated surfaces 2 x 5 Standing hip abd x 15 bilat Standing hip ext x 15 bilat Heel/toe raises x 15  Neuromuscular re-ed: VOR seated head turns, head nods VOR standing head nods VOR cancellation horizontal x 10 90 degree turns 1 UE support 180 degree turns 1 UE support Seated look down/diagonally and pick up and place cones Standing on trampoline with UE support: VOR nods, turns  Gait: Gait with head turns, head nods - CGA Gait with obstacle negotiation and stepping over obstacles - CGA DGI 10/24   VESTIBULAR TREATMENT:                                                                                                   DATE: 01/10/23  Gaze Adaptation:  x1 Viewing Horizontal: Position: seated and Reps: 10 and x1 Viewing Vertical:  Position: seated and Reps: 10 Habituation:  Other: saccades horizontal x 10  PATIENT EDUCATION: Education details: PT POC and goals, HEP Person educated: Patient Education method: Explanation, Demonstration, and Handouts Education comprehension: verbalized understanding and returned demonstration  HOME EXERCISE PROGRAM: Access Code: [email or web address] URL: [email or web address] Date: 02/06/2023 Prepared by: Darice Conine  Exercises - Seated Gaze Stabilization with Head Nod  - 1 x daily - 7 x weekly - 3 sets - 10 reps - Seated Gaze Stabilization with Head Rotation  - 1 x daily - 7 x weekly - 3 sets - 10 reps - Seated Horizontal  Saccades  - 1 x daily - 7 x weekly - 3 sets - 10 reps - Sit to Stand with Armchair  - 1 x daily - 7 x weekly - 3 sets - 5 reps - Standing Hip Abduction with Counter Support  - 1 x daily - 7 x weekly - 2 sets - 10 reps - Standing Knee Flexion AROM with Chair Support  - 1 x daily - 7 x weekly - 2 sets - 10 reps  GOALS: Goals reviewed with patient? Yes  SHORT TERM GOALS: Target date: 02/07/2023    Pt will be independent in initial HEP Baseline: Goal status: IN PROGRESS  2.  Pt will unload dishwasher with dizziness <= 2/10 Baseline:  Goal status: IN PROGRESS  LONG TERM GOALS: Target date: 03/07/2023    Pt will be independent with advanced HEP Baseline:  Goal status: INITIAL  2.  Pt will tolerate unloading dishwasher without increase in symptoms Baseline:  Goal status: INITIAL  3.  Pt will perform floor transfer to get up herself after a fall Baseline:  Goal status: INITIAL  4.  Pt will improve DGI to >= 14/24 Baseline:  Goal status: INITIAL     ASSESSMENT:  CLINICAL IMPRESSION: Pt states she feels dizziness is improving. She was able to perform all activities today with only minor increase in symptoms. She has difficulty with VOR with a busy background, so encouraged pt to use busy background for HEP.  DGI performed today with pt a high fall risk. Added strengthening activities to improve hip and ankle strength and reduce risk of falls  OBJECTIVE IMPAIRMENTS: decreased activity tolerance,  decreased balance, and dizziness.     PLAN:  PT FREQUENCY: 1x/week  PT DURATION: 8 weeks  PLANNED INTERVENTIONS: 97164- PT Re-evaluation, 97110-Therapeutic exercises, 97530- Therapeutic activity, [id number]- Neuromuscular re-education, 97535- Self Care, 02859- Manual therapy, [phone]- Canalith repositioning, Balance training, and Vestibular training  PLAN FOR NEXT SESSION: assess balance and add to HEP, assess response to vestibular HEP   Zerenity Bowron, PT 02/06/2023, 1:58 PM

## 2023-02-08 ENCOUNTER — Other Ambulatory Visit: Payer: Self-pay | Admitting: Physician Assistant

## 2023-02-08 ENCOUNTER — Other Ambulatory Visit (HOSPITAL_COMMUNITY): Payer: Self-pay

## 2023-02-08 DIAGNOSIS — E1165 Type 2 diabetes mellitus with hyperglycemia: Secondary | ICD-10-CM

## 2023-02-10 ENCOUNTER — Other Ambulatory Visit (HOSPITAL_COMMUNITY): Payer: Self-pay

## 2023-02-10 ENCOUNTER — Other Ambulatory Visit: Payer: Self-pay | Admitting: Physician Assistant

## 2023-02-10 DIAGNOSIS — M1A4720 Other secondary chronic gout, left ankle and foot, without tophus (tophi): Secondary | ICD-10-CM

## 2023-02-11 ENCOUNTER — Other Ambulatory Visit: Payer: Self-pay

## 2023-02-12 ENCOUNTER — Other Ambulatory Visit (HOSPITAL_COMMUNITY): Payer: Self-pay

## 2023-02-14 ENCOUNTER — Other Ambulatory Visit (HOSPITAL_COMMUNITY): Payer: Self-pay

## 2023-02-14 ENCOUNTER — Ambulatory Visit: Payer: PPO | Attending: Physician Assistant | Admitting: Physical Therapy

## 2023-02-14 ENCOUNTER — Encounter: Payer: Self-pay | Admitting: Physical Therapy

## 2023-02-14 DIAGNOSIS — R42 Dizziness and giddiness: Secondary | ICD-10-CM | POA: Diagnosis not present

## 2023-02-14 DIAGNOSIS — R2689 Other abnormalities of gait and mobility: Secondary | ICD-10-CM | POA: Insufficient documentation

## 2023-02-14 MED ORDER — ALLOPURINOL 100 MG PO TABS
100.0000 mg | ORAL_TABLET | Freq: Every day | ORAL | 3 refills | Status: DC
  Filled 2023-02-14 – 2023-04-05 (×2): qty 90, 90d supply, fill #0
  Filled 2023-07-31: qty 90, 90d supply, fill #1
  Filled 2023-11-04: qty 90, 90d supply, fill #2
  Filled 2023-11-12: qty 90, 90d supply, fill #3

## 2023-02-14 NOTE — Therapy (Addendum)
 OUTPATIENT PHYSICAL THERAPY VESTIBULAR TREATMENT AND DISCHARGE     Patient Name: Amanda [name] MRN: [id number] DOB:[birth year], 68 y.o., female Today's Date: 02/14/2023  END OF SESSION:  PT End of Session - 02/14/23 1519     Visit Number 3    Number of Visits 8    Date for PT Re-Evaluation 03/07/23    Authorization Type [employer]    Authorization - Visit Number 3    Progress Note Due on Visit 10    PT Start Time 1445    PT Stop Time 1515   pt ill - had to leave early   PT Time Calculation (min) 30 min    Activity Tolerance Patient tolerated treatment well    Behavior During Therapy WFL for tasks assessed/performed               Past Medical History:  Diagnosis Date   Allergy    seasonal   Arthritis    all over   Blood dyscrasia    Carrys trait for Leiden Factor five never had any issues . Yhe only reason she was tested was because her mother had it   Diabetes mellitus without complication (HCC)    Pre surgery - Gastric sx 2014 Roun-Y no diabetes since  no meds   Fatty liver 05/2006   by US    Gout    Hip pain    Hypertension    Knee pain    Obesity    Pneumonia    Recurrent UTI    Sleep apnea    had roux en y lost 140lbs no longer needs cpap   Past Surgical History:  Procedure Laterality Date   APPENDECTOMY  1977   CESAREAN SECTION  1989   one   CHOLECYSTECTOMY  1977   DILATION AND CURETTAGE OF UTERUS  1987 & 1988   REDUCTION MAMMAPLASTY Bilateral 1985   ROUX-EN-Y GASTRIC BYPASS  2014   TOTAL KNEE ARTHROPLASTY Right 07/21/2019   Procedure: TOTAL KNEE ARTHROPLASTY;  Surgeon: Melodi Lerner, MD;  Location: [facility];  Service: Orthopedics;  Laterality: Right;    Patient Active Problem List   Diagnosis Date Noted   White matter abnormality on MRI of brain 01/01/2023   Atopic dermatitis 01/01/2023   Tremors of nervous system 12/25/2022   Frequent falls 12/25/2022   Gait abnormality 12/22/2022   Recurrent UTI 06/26/2022   Family  history of Parkinson disease 06/26/2022   Post-menopausal 02/17/2022   Urinary incontinence without sensory awareness 02/13/2022   Class 1 obesity due to excess calories with serious comorbidity and body mass index (BMI) of 30.0 to 30.9 in adult 07/13/2021   Dyslipidemia, goal LDL below 70 07/13/2021   Type 2 diabetes mellitus with hyperglycemia, without long-term current use of insulin  (HCC) 03/18/2021   OAB (overactive bladder) 03/07/2021   Mixed stress and urge urinary incontinence 03/07/2021   Urinary frequency 03/07/2021   Groin pain, right 08/03/2020   Memory changes 07/12/2020   Right hip pain 07/12/2020   Lumbar spondylosis 07/12/2020   Numbness and tingling of right side of face 07/12/2020   Osteoporosis 07/12/2020   Pap smear abnormality of cervix/human papillomavirus (HPV) positive 07/12/2020   OA (osteoarthritis) of knee 07/21/2019   Hammer toe of left foot 07/23/2018   Epigastric pain 07/23/2018   Onychomycosis 07/23/2018   Bilateral primary osteoarthritis of knee 03/05/2018   Gout 03/05/2018   Chronic pain of both knees 03/05/2018   Status post laparoscopic sleeve gastrectomy 03/04/2018   Obesity  05/23/2010   LEG CRAMPS 07/17/2009   Constipation 06/21/2009   FATTY LIVER DISEASE 09/12/2007   Essential hypertension 05/16/2007   Arthropathy 05/16/2007   Diverticulosis of colon 10/18/2006   HYPERCHOLESTEROLEMIA 09/05/2006   Factor V deficiency (HCC) 09/05/2006    PCP: Antoniette REFERRING PROVIDER: Antoniette  REFERRING DIAG: frequent falls, vertigo  THERAPY DIAG:  Dizziness and giddiness  Balance disorder  ONSET DATE: MD visit 12/2022  Rationale for Evaluation and Treatment: Rehabilitation  SUBJECTIVE:   SUBJECTIVE STATEMENT: Pt states she feels her dizziness is getting better. She has not tried unloading the dishwasher lately. Pt is feeling nauseous today due to taking ozempic   PERTINENT HISTORY: possible parkinson's disease, history of hip and back  pain  Pt states she has had dizziness which she believes is causing her falls. She states she has had 3-4 falls recently, no injury but pt is not able to get up from the ground by herself. She feels most dizzy with looking down and with quick head turns. Pt states she does use a rollator for times she has to look down like when she is loading the dishwasher  PAIN:  Are you having pain? No  PRECAUTIONS: Fall  RED FLAGS: None   WEIGHT BEARING RESTRICTIONS: No  FALLS: Has patient fallen in last 6 months? Yes. Number of falls 4  PLOF: Independent  PATIENT GOALS: decrease dizziness, decrease falls  OBJECTIVE:  Note: Objective measures were completed at Evaluation unless otherwise noted.   GAIT: Gait pattern: decreased stride length and lateral hip instability Distance walked: 100' Assistive device utilized: None Level of assistance: Complete Independence   VESTIBULAR ASSESSMENT:    SYMPTOM BEHAVIOR:  Subjective history: see above  Non-Vestibular symptoms:  none reported  Type of dizziness: Spinning/Vertigo, Lightheadedness/Faint, and World moves  Frequency: daily to 3 x a week  Duration: seconds  Aggravating factors: Induced by motion: bending down to the ground  Relieving factors: head stationary  Progression of symptoms: unchanged  OCULOMOTOR EXAM:  Ocular Alignment: normal  Ocular ROM: No Limitations  Spontaneous Nystagmus: absent  Gaze-Induced Nystagmus: age appropriate nystagmus at end range - dizziness with looking up/left  Smooth Pursuits: intact  Saccades: intact - increased symptoms    VESTIBULAR - OCULAR REFLEX:   Slow VOR: Positive Bilaterally  VOR Cancellation: Normal  Head-Impulse Test: HIT Right: negative HIT Left: negative     POSITIONAL TESTING: Right Dix-Hallpike: no nystagmus Left Dix-Hallpike: no nystagmus  MOTION SENSITIVITY:  Motion Sensitivity Quotient Intensity: 0 = none, 1 = Lightheaded, 2 = Mild, 3 = Moderate, 4 = Severe, 5 =  Vomiting  Intensity  1. Sitting to supine   2. Supine to L side   3. Supine to R side   4. Supine to sitting   5. L Hallpike-Dix 0  6. Up from L  0  7. R Hallpike-Dix 0  8. Up from R  0  9. Sitting, head tipped to L knee   10. Head up from L knee   11. Sitting, head tipped to R knee   12. Head up from R knee   13. Sitting head turns x5 2  14.Sitting head nods x5 1  15. In stance, 180 turn to L    16. In stance, 180 turn to R      [facility] Adult PT Treatment:  DATE: 02/14/23 Therapeutic Exercise: Sit <> stand elevated surface 2 x 5  Neuromuscular re-ed: Seated bending over/reaching up x 10 VOR head turns on trampoline - attempted busy background but pt unable due to nausea, performed with plain background VOR with cancelation on trampoline Search and find different letters while standing on trampoline - plain background Seated VOR head turns busy background Seated head diagonals   [facility] Adult PT Treatment:                                                DATE: 02/06/23 Therapeutic Exercise: STS from elevated surfaces 2 x 5 Standing hip abd x 15 bilat Standing hip ext x 15 bilat Heel/toe raises x 15  Neuromuscular re-ed: VOR seated head turns, head nods VOR standing head nods VOR cancellation horizontal x 10 90 degree turns 1 UE support 180 degree turns 1 UE support Seated look down/diagonally and pick up and place cones Standing on trampoline with UE support: VOR nods, turns  Gait: Gait with head turns, head nods - CGA Gait with obstacle negotiation and stepping over obstacles - CGA DGI 10/24   PATIENT EDUCATION: Education details: PT POC and goals, HEP Person educated: Patient Education method: Explanation, Demonstration, and Handouts Education comprehension: verbalized understanding and returned demonstration  HOME EXERCISE PROGRAM: Access Code: [email or web address] URL: [email or web address] Date:  02/06/2023 Prepared by: Darice Conine  Exercises - Seated Gaze Stabilization with Head Nod  - 1 x daily - 7 x weekly - 3 sets - 10 reps - Seated Gaze Stabilization with Head Rotation  - 1 x daily - 7 x weekly - 3 sets - 10 reps - Seated Horizontal Saccades  - 1 x daily - 7 x weekly - 3 sets - 10 reps - Sit to Stand with Armchair  - 1 x daily - 7 x weekly - 3 sets - 5 reps - Standing Hip Abduction with Counter Support  - 1 x daily - 7 x weekly - 2 sets - 10 reps - Standing Knee Flexion AROM with Chair Support  - 1 x daily - 7 x weekly - 2 sets - 10 reps  GOALS: Goals reviewed with patient? Yes  SHORT TERM GOALS: Target date: 02/07/2023    Pt will be independent in initial HEP Baseline: Goal status: IN PROGRESS  2.  Pt will unload dishwasher with dizziness <= 2/10 Baseline:  Goal status: IN PROGRESS  LONG TERM GOALS: Target date: 03/07/2023    Pt will be independent with advanced HEP Baseline:  Goal status: INITIAL  2.  Pt will tolerate unloading dishwasher without increase in symptoms Baseline:  Goal status: INITIAL  3.  Pt will perform floor transfer to get up herself after a fall Baseline:  Goal status: INITIAL  4.  Pt will improve DGI to >= 14/24 Baseline:  Goal status: INITIAL     ASSESSMENT:  CLINICAL IMPRESSION: Pt requires increased rest breaks today due to nausea. Treatment modified as needed. Added VOR with busy background to HEP. Pt unable to complete full session due to nausea. PT advised pt to follow up with MD about nausea symptoms as pt believes they are due to medication  OBJECTIVE IMPAIRMENTS: decreased activity tolerance, decreased balance, and dizziness.     PLAN:  PT FREQUENCY: 1x/week  PT DURATION: 8 weeks  PLANNED INTERVENTIONS: 97164- PT Re-evaluation, 97110-Therapeutic exercises, 97530- Therapeutic  activity, [id number]- Neuromuscular re-education, 97535- Self Care, 02859- Manual therapy, [phone]- Canalith repositioning, Balance training,  and Vestibular training  PLAN FOR NEXT SESSION: balance and vestibular, activity tolerance  PHYSICAL THERAPY DISCHARGE SUMMARY  Visits from Start of Care: 3  Current functional level related to goals / functional outcomes: Decreased dizziness   Remaining deficits: See above   Education / Equipment: HEP   Patient agrees to discharge. Patient goals were partially met. Patient is being discharged due to not returning since the last visit. Darice Conine, PT,DPT03/14/259:53 AM   Shantice Menger, PT 02/14/2023, 3:20 PM

## 2023-02-21 ENCOUNTER — Other Ambulatory Visit (HOSPITAL_COMMUNITY): Payer: Self-pay

## 2023-02-28 ENCOUNTER — Other Ambulatory Visit: Payer: Self-pay

## 2023-02-28 ENCOUNTER — Other Ambulatory Visit (HOSPITAL_COMMUNITY): Payer: Self-pay

## 2023-03-20 ENCOUNTER — Other Ambulatory Visit (HOSPITAL_COMMUNITY): Payer: Self-pay

## 2023-03-21 ENCOUNTER — Other Ambulatory Visit (HOSPITAL_COMMUNITY): Payer: Self-pay

## 2023-03-22 ENCOUNTER — Ambulatory Visit: Payer: PPO | Admitting: Dietician

## 2023-04-03 ENCOUNTER — Ambulatory Visit: Payer: PPO | Admitting: Physician Assistant

## 2023-04-03 ENCOUNTER — Other Ambulatory Visit: Payer: Self-pay

## 2023-04-03 ENCOUNTER — Encounter: Payer: Self-pay | Admitting: Physician Assistant

## 2023-04-03 ENCOUNTER — Other Ambulatory Visit (HOSPITAL_COMMUNITY): Payer: Self-pay

## 2023-04-03 VITALS — BP 112/97 | HR 80 | Ht 67.0 in | Wt 180.3 lb

## 2023-04-03 DIAGNOSIS — R202 Paresthesia of skin: Secondary | ICD-10-CM | POA: Diagnosis not present

## 2023-04-03 DIAGNOSIS — E1165 Type 2 diabetes mellitus with hyperglycemia: Secondary | ICD-10-CM

## 2023-04-03 DIAGNOSIS — I10 Essential (primary) hypertension: Secondary | ICD-10-CM | POA: Diagnosis not present

## 2023-04-03 DIAGNOSIS — E785 Hyperlipidemia, unspecified: Secondary | ICD-10-CM | POA: Diagnosis not present

## 2023-04-03 DIAGNOSIS — E7800 Pure hypercholesterolemia, unspecified: Secondary | ICD-10-CM

## 2023-04-03 LAB — POCT GLYCOSYLATED HEMOGLOBIN (HGB A1C): HbA1c, POC (controlled diabetic range): 5.8 % (ref 0.0–7.0)

## 2023-04-03 LAB — POCT UA - MICROALBUMIN
Creatinine, POC: 100 mg/dL
Microalbumin Ur, POC: 80 mg/L

## 2023-04-03 MED ORDER — FREESTYLE LIBRE 14 DAY SENSOR MISC
1.0000 | 11 refills | Status: DC
  Filled 2023-04-03: qty 2, 28d supply, fill #0
  Filled 2023-04-30: qty 2, 28d supply, fill #1

## 2023-04-03 NOTE — Progress Notes (Signed)
 Established Patient Office Visit  Subjective   Patient ID: Amanda [name], female    DOB: 1955-07-14  Age: 68 y.o. MRN: [id number]  CC: diabetes follow up   HPI Patient is a 68 year old female who presents for diabetes follow up appointment. She is followed by nutrition. She is taking Metformin 1000mg  daily and is not checking her blood sugars at home. She is interested in [place] that her husband has but she does not take insulin. She has worked really hard on diet and exercise. She denies any CP, palpitations, headaches, or dizziness. She did have one episode for about one hour where her right index and middle finger felt numb and she could not move it. It resolved on it's own and not come back.   She was seen by neurology and did not think there were any clinical signs of parkinson disease.   ROS   See HPI.  Objective:     BP (!) 112/97   Pulse 80   Ht 5\' 7"  (1.702 m)   Wt 180 lb 5 oz (81.8 kg)   SpO2 99%   BMI 28.24 kg/m  BP Readings from Last 3 Encounters:  04/03/23 (!) 112/97  01/11/23 130/71  01/01/23 126/70   Wt Readings from Last 3 Encounters:  04/03/23 180 lb 5 oz (81.8 kg)  01/24/23 203 lb 12.8 oz (92.4 kg)  01/11/23 205 lb 12.8 oz (93.4 kg)      Physical Exam Constitutional:      Appearance: Normal appearance. She is obese.  HENT:     Head: Normocephalic.  Cardiovascular:     Rate and Rhythm: Normal rate and regular rhythm.  Pulmonary:     Effort: Pulmonary effort is normal.     Breath sounds: Normal breath sounds.  Musculoskeletal:     Right lower leg: No edema.     Left lower leg: No edema.     Comments: NROM of bilateral hands and wrists with 5/5 strength.  Negative tinels and phalens, bilaterally.   Neurological:     General: No focal deficit present.     Mental Status: She is alert and oriented to person, place, and time.  Psychiatric:        Mood and Affect: Mood normal.      Results for orders placed or performed in visit on 04/03/23   Lipid panel  Result Value Ref Range   Cholesterol, Total 77 (L) 100 - 199 mg/dL   Triglycerides 87 0 - 149 mg/dL   HDL 53 >40 mg/dL   VLDL Cholesterol Cal 17 5 - 40 mg/dL   LDL Chol Calc (NIH) 7 0 - 99 mg/dL   Chol/HDL Ratio 1.5 0.0 - 4.4 ratio  CMP14+EGFR  Result Value Ref Range   Glucose 130 (H) 70 - 99 mg/dL   BUN 12 8 - 27 mg/dL   Creatinine, Ser 9.81 (H) 0.57 - 1.00 mg/dL   eGFR 54 (L) >19 JY/NWG/9.56   BUN/Creatinine Ratio 11 (L) 12 - 28   Sodium 148 (H) 134 - 144 mmol/L   Potassium 4.7 3.5 - 5.2 mmol/L   Chloride 111 (H) 96 - 106 mmol/L   CO2 21 20 - 29 mmol/L   Calcium 9.9 8.7 - 10.3 mg/dL   Total Protein 6.5 6.0 - 8.5 g/dL   Albumin 4.1 3.9 - 4.9 g/dL   Globulin, Total 2.4 1.5 - 4.5 g/dL   Bilirubin Total 0.5 0.0 - 1.2 mg/dL   Alkaline Phosphatase 57 44 -  121 IU/L   AST 34 0 - 40 IU/L   ALT 25 0 - 32 IU/L  POCT HgB A1C  Result Value Ref Range   Hemoglobin A1C     HbA1c POC (<> result, manual entry)     HbA1c, POC (prediabetic range)     HbA1c, POC (controlled diabetic range) 5.8 0.0 - 7.0 %  POCT UA - Microalbumin  Result Value Ref Range   Microalbumin Ur, POC 80 mg/L   Creatinine, POC 100 mg/dL   Albumin/Creatinine Ratio, Urine, POC 30-300       Assessment & Plan:  .Marland KitchenNanami "[name]" was seen today for diabetes.  Diagnoses and all orders for this visit:  Type 2 diabetes mellitus with hyperglycemia, without long-term current use of insulin (HCC) -     POCT HgB A1C -     Lipid panel -     CMP14+EGFR -     Continuous Glucose Sensor (FREESTYLE LIBRE 14 DAY SENSOR) MISC; Apply to upper deltoid every 14 days. Use reader to determine blood sugars -     POCT UA - Microalbumin -     VAS US CAROTID; Future  Paresthesias -     Lipid panel -     CMP14+EGFR -     VAS US CAROTID; Future  HYPERCHOLESTEROLEMIA -     Lipid panel -     CMP14+EGFR -     VAS US CAROTID; Future  Essential hypertension -     Lipid panel -     CMP14+EGFR  Dyslipidemia, goal LDL  below 70 -     Lipid panel -     CMP14+EGFR -     VAS US CAROTID; Future   A1C has improved a lot at 5.8 and to goal.  Continue same medications Fasting labs ordered BP to goal On statin Needs eye exam Declines vaccine Follow up in 3 months  Concerned with her CV risk of stroke like symptoms Will get carotid doppler for numbness and loss of function of fingers Follow up if happens again   Return in about 3 months (around 07/01/2023).    Tandy Gaw, PA-C

## 2023-04-03 NOTE — Patient Instructions (Addendum)
 GREAT JOB! Carotid u/s

## 2023-04-04 ENCOUNTER — Encounter: Payer: Self-pay | Admitting: Physician Assistant

## 2023-04-04 DIAGNOSIS — R202 Paresthesia of skin: Secondary | ICD-10-CM | POA: Insufficient documentation

## 2023-04-04 DIAGNOSIS — N183 Chronic kidney disease, stage 3 unspecified: Secondary | ICD-10-CM | POA: Insufficient documentation

## 2023-04-04 LAB — CMP14+EGFR
ALT: 25 [IU]/L (ref 0–32)
AST: 34 [IU]/L (ref 0–40)
Albumin: 4.1 g/dL (ref 3.9–4.9)
Alkaline Phosphatase: 57 [IU]/L (ref 44–121)
BUN/Creatinine Ratio: 11 — ABNORMAL LOW (ref 12–28)
BUN: 12 mg/dL (ref 8–27)
Bilirubin Total: 0.5 mg/dL (ref 0.0–1.2)
CO2: 21 mmol/L (ref 20–29)
Calcium: 9.9 mg/dL (ref 8.7–10.3)
Chloride: 111 mmol/L — ABNORMAL HIGH (ref 96–106)
Creatinine, Ser: 1.11 mg/dL — ABNORMAL HIGH (ref 0.57–1.00)
Globulin, Total: 2.4 g/dL (ref 1.5–4.5)
Glucose: 130 mg/dL — ABNORMAL HIGH (ref 70–99)
Potassium: 4.7 mmol/L (ref 3.5–5.2)
Sodium: 148 mmol/L — ABNORMAL HIGH (ref 134–144)
Total Protein: 6.5 g/dL (ref 6.0–8.5)
eGFR: 54 mL/min/{1.73_m2} — ABNORMAL LOW (ref >59)

## 2023-04-04 LAB — LIPID PANEL
Chol/HDL Ratio: 1.5 {ratio} (ref 0.0–4.4)
Cholesterol, Total: 77 mg/dL — ABNORMAL LOW (ref 100–199)
HDL: 53 mg/dL (ref >39)
LDL Chol Calc (NIH): 7 mg/dL (ref 0–99)
Triglycerides: 87 mg/dL (ref 0–149)
VLDL Cholesterol Cal: 17 mg/dL (ref 5–40)

## 2023-04-04 NOTE — Progress Notes (Signed)
 Amanda [name],   Cholesterol is VERY low. HDL, good cholesterol, is great!   Kidney function dropped a little more. I would like for you to consider adding farxiga to help protect kidney and helps with sugar lowering as well. Thoughts?

## 2023-04-05 ENCOUNTER — Other Ambulatory Visit: Payer: Self-pay

## 2023-04-06 ENCOUNTER — Encounter: Payer: Self-pay | Admitting: Physician Assistant

## 2023-04-10 ENCOUNTER — Other Ambulatory Visit (HOSPITAL_COMMUNITY): Payer: Self-pay

## 2023-04-10 MED ORDER — DAPAGLIFLOZIN PROPANEDIOL 10 MG PO TABS
10.0000 mg | ORAL_TABLET | Freq: Every day | ORAL | 0 refills | Status: DC
  Filled 2023-04-10: qty 90, 90d supply, fill #0

## 2023-04-11 ENCOUNTER — Encounter: Payer: Self-pay | Admitting: Dietician

## 2023-04-11 ENCOUNTER — Other Ambulatory Visit: Payer: Self-pay

## 2023-04-11 ENCOUNTER — Encounter: Payer: PPO | Attending: Physician Assistant | Admitting: Dietician

## 2023-04-11 DIAGNOSIS — E119 Type 2 diabetes mellitus without complications: Secondary | ICD-10-CM | POA: Insufficient documentation

## 2023-04-11 NOTE — Progress Notes (Signed)
 Diabetes Self-Management Education  Visit Type: Follow-up  Appt. Start Time: 1010 Appt. End Time: 1040  04/11/2023  [name], identified by name and date of birth, is a 68 y.o. female with a diagnosis of Diabetes: type 2  .   ASSESSMENT  This is a virtual visit.  Patient location: Home Provider location: Office  Primary concern: diabetes management and weight   History includes: allergies, arthritis, sleep apnea, HTN, type 2 diabetes Labs noted: 04/03/23: A1c 5.8% Medications include: metformin Supplements: MVI, iron   Wt Readings from Last 3 Encounters:  04/03/23 180 lb 5 oz (81.8 kg)  01/24/23 203 lb 12.8 oz (92.4 kg)  01/11/23 205 lb 12.8 oz (93.4 kg)   A1c down from 9.8% on 01/01/23 to 5.8% on 04/03/23. Pt states she has been trying to takes things very seriously with her health. Pt reports she was very happy with her recent labs, and states she has been working hard to bring her A1c and triglycerides down.   Pt reports she feels she has become bored with her eating. Pt states she also has noticed her hunger decrease. Pt reports usually eating twice per day. Pt states she has occasionally felt low when her blood glucose was 90mg /dL. Pt states she continues to check her blood glucose and reports she just got a [employer] but hasn't put it on yet. Pt reports she is starting farxiga soon.   Goal: drink 2 water bottles daily.   Goal: aim to eat at least 3 times per day (even if just a snack).   There were no vitals taken for this visit. There is no height or weight on file to calculate BMI.   Diabetes Self-Management Education - 04/11/23 1015       Visit Information   Visit Type Follow-up      Health Coping   How would you rate your overall health? Good      Psychosocial Assessment   Patient Belief/Attitude about Diabetes Motivated to manage diabetes    What is the hardest part about your diabetes right now, causing you the most concern, or is the most  worrisome to you about your diabetes?   Making healty food and beverage choices    Self-care barriers None    Self-management support Doctor's office    Other persons present Patient    Patient Concerns Nutrition/Meal planning    Special Needs None    Preferred Learning Style No preference indicated    Learning Readiness Ready      Pre-Education Assessment   Patient understands the diabetes disease and treatment process. Needs Review    Patient understands incorporating nutritional management into lifestyle. Needs Review    Patient undertands incorporating physical activity into lifestyle. Needs Review    Patient understands using medications safely. Needs Review    Patient understands monitoring blood glucose, interpreting and using results Needs Review    Patient understands prevention, detection, and treatment of acute complications. Needs Review    Patient understands prevention, detection, and treatment of chronic complications. Needs Review    Patient understands how to develop strategies to address psychosocial issues. Needs Review    Patient understands how to develop strategies to promote health/change behavior. Needs Review      Complications   Last HgB A1C per patient/outside source 5.8 %    Fasting Blood glucose range (mg/dL) 57-846    Postprandial Blood glucose range (mg/dL) 962-952      Dietary Intake   Breakfast coffee with  SF creamer    Snack (morning) 10am: low sugar greek yogurt    Lunch none    Snack (afternoon) none    Dinner side salad and grilled chicken    Snack (evening) none    Beverage(s) coffee with SF creamer, 2-3 lipton zero sugar, 8 oz water      Activity / Exercise   Activity / Exercise Type ADL's    How many days per week do you exercise? 0    How many minutes per day do you exercise? 0    Total minutes per week of exercise 0      Patient Education   Previous Diabetes Education Yes (please comment)    Disease Pathophysiology Explored patient's  options for treatment of their diabetes    Healthy Eating Role of diet in the treatment of diabetes and the relationship between the three main macronutrients and blood glucose level;Plate Method;Reviewed blood glucose goals for pre and post meals and how to evaluate the patients' food intake on their blood glucose level.;Meal options for control of blood glucose level and chronic complications.    Being Active Role of exercise on diabetes management, blood pressure control and cardiac health.;Helped patient identify appropriate exercises in relation to his/her diabetes, diabetes complications and other health issue.    Medications Reviewed patients medication for diabetes, action, purpose, timing of dose and side effects.    Monitoring Identified appropriate SMBG and/or A1C goals.    Acute complications Taught prevention, symptoms, and  treatment of hypoglycemia - the 15 rule.    Chronic complications Relationship between chronic complications and blood glucose control;Identified and discussed with patient  current chronic complications    Diabetes Stress and Support Identified and addressed patients feelings and concerns about diabetes    Lifestyle and Health Coping Lifestyle issues that need to be addressed for better diabetes care      Individualized Goals (developed by patient)   Nutrition General guidelines for healthy choices and portions discussed    Physical Activity Exercise 1-2 times per week;30 minutes per day    Medications take my medication as prescribed    Monitoring  Test my blood glucose as discussed    Problem Solving Eating Pattern    Reducing Risk examine blood glucose patterns;do foot checks daily;treat hypoglycemia with 15 grams of carbs if blood glucose less than 70mg /dL    Health Coping Ask for help with psychological, social, or emotional issues      Patient Self-Evaluation of Goals - Patient rates self as meeting previously set goals (% of time)   Nutrition 50 - 75 %  (half of the time)    Physical Activity 25 - 50% (sometimes)    Medications >75% (most of the time)    Monitoring >75% (most of the time)    Problem Solving and behavior change strategies  50 - 75 % (half of the time)    Reducing Risk (treating acute and chronic complications) >75% (most of the time)    Health Coping 50 - 75 % (half of the time)      Post-Education Assessment   Patient understands the diabetes disease and treatment process. Comprehends key points    Patient understands incorporating nutritional management into lifestyle. Comprehends key points    Patient undertands incorporating physical activity into lifestyle. Comprehends key points    Patient understands using medications safely. Demonstrates understanding / competency    Patient understands monitoring blood glucose, interpreting and using results Comprehends key points  Patient understands prevention, detection, and treatment of acute complications. Comprehends key points    Patient understands prevention, detection, and treatment of chronic complications. Comprehends key points    Patient understands how to develop strategies to address psychosocial issues. Comprehends key points    Patient understands how to develop strategies to promote health/change behavior. Comprehends key points      Outcomes   Expected Outcomes Demonstrated interest in learning. Expect positive outcomes    Future DMSE PRN    Program Status Completed      Subsequent Visit   Since your last visit have you continued or begun to take your medications as prescribed? Yes             Individualized Plan for Diabetes Self-Management Training:   Learning Objective:  Patient will have a greater understanding of diabetes self-management. Patient education plan is to attend individual and/or group sessions per assessed needs and concerns.   Plan:   Patient Instructions  New Goals  Goal: drink 2 water bottles daily.   Goal: aim to eat at  least 3 times per day (even if just a snack).   Expected Outcomes:  Demonstrated interest in learning. Expect positive outcomes  Education material provided: none (virtual)  If problems or questions, patient to contact team via:  Phone  Future DSME appointment: PRN

## 2023-04-11 NOTE — Patient Instructions (Signed)
 New Goals  Goal: drink 2 water bottles daily.   Goal: aim to eat at least 3 times per day (even if just a snack).

## 2023-04-13 ENCOUNTER — Other Ambulatory Visit (HOSPITAL_COMMUNITY): Payer: Self-pay

## 2023-04-17 ENCOUNTER — Other Ambulatory Visit (HOSPITAL_COMMUNITY): Payer: Self-pay

## 2023-04-23 ENCOUNTER — Other Ambulatory Visit: Payer: Self-pay | Admitting: Physician Assistant

## 2023-04-23 DIAGNOSIS — M25552 Pain in left hip: Secondary | ICD-10-CM

## 2023-04-23 DIAGNOSIS — M545 Low back pain, unspecified: Secondary | ICD-10-CM

## 2023-04-23 DIAGNOSIS — E1165 Type 2 diabetes mellitus with hyperglycemia: Secondary | ICD-10-CM

## 2023-04-23 DIAGNOSIS — I10 Essential (primary) hypertension: Secondary | ICD-10-CM

## 2023-04-23 DIAGNOSIS — E785 Hyperlipidemia, unspecified: Secondary | ICD-10-CM

## 2023-04-23 DIAGNOSIS — R202 Paresthesia of skin: Secondary | ICD-10-CM

## 2023-04-23 DIAGNOSIS — E7800 Pure hypercholesterolemia, unspecified: Secondary | ICD-10-CM

## 2023-04-25 ENCOUNTER — Other Ambulatory Visit (HOSPITAL_COMMUNITY): Payer: Self-pay

## 2023-04-25 ENCOUNTER — Other Ambulatory Visit: Payer: Self-pay

## 2023-04-25 ENCOUNTER — Other Ambulatory Visit: Payer: Self-pay | Admitting: Physician Assistant

## 2023-04-25 DIAGNOSIS — I10 Essential (primary) hypertension: Secondary | ICD-10-CM

## 2023-04-25 MED ORDER — LOSARTAN POTASSIUM 25 MG PO TABS
25.0000 mg | ORAL_TABLET | Freq: Every day | ORAL | 1 refills | Status: DC
  Filled 2023-04-25: qty 90, 90d supply, fill #0
  Filled 2023-07-31: qty 90, 90d supply, fill #1

## 2023-04-26 ENCOUNTER — Telehealth: Payer: Self-pay

## 2023-04-26 NOTE — Telephone Encounter (Signed)
 Copied from CRM #[id number]. Topic: General - Other >> Apr 26, 2023  2:33 PM Shardie S wrote: Reason for CRM: Barbara Cower with [facility] [employer] calling to check the status of a request for [employer]. He states paperwork was faxed on 03/13. Callback # [phone].

## 2023-04-26 NOTE — Telephone Encounter (Signed)
 I haven't seen any paperwork if it came through it was given to [facility] to sign off on.

## 2023-04-26 NOTE — Telephone Encounter (Signed)
 Have this task been completed? Was the form ever received? Thanks in advance.

## 2023-04-30 ENCOUNTER — Telehealth: Payer: Self-pay

## 2023-04-30 ENCOUNTER — Ambulatory Visit (HOSPITAL_BASED_OUTPATIENT_CLINIC_OR_DEPARTMENT_OTHER)
Admission: RE | Admit: 2023-04-30 | Discharge: 2023-04-30 | Disposition: A | Discharge status: Home / Self Care | Payer: PPO | Source: Ambulatory Visit | Attending: Physician Assistant | Admitting: Physician Assistant

## 2023-04-30 DIAGNOSIS — R202 Paresthesia of skin: Secondary | ICD-10-CM | POA: Insufficient documentation

## 2023-04-30 NOTE — Telephone Encounter (Signed)
 Copied from CRM [id number]. Topic: General - Other >> Apr 27, 2023  2:09 PM Geroge Baseman wrote: Reason for CRM: [facility] [employer] calling to see if a fax was received in regard to diabetic monitoring supplies and a request for clinic notes. Callback [phone]

## 2023-04-30 NOTE — Progress Notes (Signed)
 Carotid ultrasounds are near normal with minimal plaque. GREAT news.

## 2023-05-01 ENCOUNTER — Other Ambulatory Visit: Payer: Self-pay | Admitting: Physician Assistant

## 2023-05-01 ENCOUNTER — Other Ambulatory Visit (HOSPITAL_COMMUNITY): Payer: Self-pay

## 2023-05-01 DIAGNOSIS — E1165 Type 2 diabetes mellitus with hyperglycemia: Secondary | ICD-10-CM

## 2023-05-01 MED ORDER — FREESTYLE LIBRE 14 DAY SENSOR MISC
1.0000 | 11 refills | Status: DC
Start: 2023-05-01 — End: 2023-12-18
  Filled 2023-05-01: qty 6, 84d supply, fill #0
  Filled 2023-07-20: qty 6, 84d supply, fill #1
  Filled 2023-10-15: qty 6, 84d supply, fill #2

## 2023-05-08 ENCOUNTER — Encounter: Payer: Self-pay | Admitting: Physician Assistant

## 2023-05-09 NOTE — Progress Notes (Signed)
 Your referring the alzheimer's study? I do not.

## 2023-05-10 NOTE — Telephone Encounter (Signed)
 Refaxed with confirmation 05/10/23

## 2023-05-24 ENCOUNTER — Telehealth: Payer: Self-pay

## 2023-05-24 NOTE — Telephone Encounter (Signed)
 Copied from CRM [id number]. Topic: Clinical - Lab/Test Results >> May 24, 2023 10:39 AM Corin V wrote: Reason for CRM: Dr. Dortha Gauss with the [facility] [facility] is faxing EKG results to the office for [facility] to review. They are screening patient for an alzheimer's study trial and the EKG results were abnormal. He said that they are not alarming or urgent, but he does feel that PCP needs to review them for possible plan of care adjustments. Please call the office if the fax does not come through [phone].

## 2023-05-27 ENCOUNTER — Inpatient Hospital Stay (HOSPITAL_COMMUNITY)
Admission: EM | Admit: 2023-05-27 | Discharge: 2023-06-01 | DRG: 329 | Disposition: A | Discharge status: Home / Self Care | Attending: Internal Medicine | Admitting: Internal Medicine

## 2023-05-27 ENCOUNTER — Encounter (HOSPITAL_COMMUNITY): Payer: Self-pay | Admitting: Emergency Medicine

## 2023-05-27 ENCOUNTER — Other Ambulatory Visit: Payer: Self-pay

## 2023-05-27 ENCOUNTER — Encounter (HOSPITAL_COMMUNITY)
Admission: EM | Disposition: A | Discharge status: Home / Self Care | Payer: Self-pay | Source: Home / Self Care | Attending: Internal Medicine

## 2023-05-27 ENCOUNTER — Emergency Department (HOSPITAL_COMMUNITY)

## 2023-05-27 ENCOUNTER — Inpatient Hospital Stay (HOSPITAL_COMMUNITY): Admitting: Anesthesiology

## 2023-05-27 DIAGNOSIS — E669 Obesity, unspecified: Secondary | ICD-10-CM | POA: Diagnosis not present

## 2023-05-27 DIAGNOSIS — Z7984 Long term (current) use of oral hypoglycemic drugs: Secondary | ICD-10-CM

## 2023-05-27 DIAGNOSIS — Z888 Allergy status to other drugs, medicaments and biological substances status: Secondary | ICD-10-CM

## 2023-05-27 DIAGNOSIS — K631 Perforation of intestine (nontraumatic): Principal | ICD-10-CM | POA: Diagnosis present

## 2023-05-27 DIAGNOSIS — Z7983 Long term (current) use of bisphosphonates: Secondary | ICD-10-CM | POA: Diagnosis not present

## 2023-05-27 DIAGNOSIS — K285 Chronic or unspecified gastrojejunal ulcer with perforation: Principal | ICD-10-CM | POA: Diagnosis present

## 2023-05-27 DIAGNOSIS — Z832 Family history of diseases of the blood and blood-forming organs and certain disorders involving the immune mechanism: Secondary | ICD-10-CM

## 2023-05-27 DIAGNOSIS — K760 Fatty (change of) liver, not elsewhere classified: Secondary | ICD-10-CM | POA: Diagnosis not present

## 2023-05-27 DIAGNOSIS — M1990 Unspecified osteoarthritis, unspecified site: Secondary | ICD-10-CM | POA: Diagnosis present

## 2023-05-27 DIAGNOSIS — Z792 Long term (current) use of antibiotics: Secondary | ICD-10-CM

## 2023-05-27 DIAGNOSIS — M109 Gout, unspecified: Secondary | ICD-10-CM | POA: Diagnosis present

## 2023-05-27 DIAGNOSIS — R1012 Left upper quadrant pain: Secondary | ICD-10-CM | POA: Diagnosis not present

## 2023-05-27 DIAGNOSIS — R000 Tachycardia, unspecified: Secondary | ICD-10-CM | POA: Diagnosis not present

## 2023-05-27 DIAGNOSIS — Z79899 Other long term (current) drug therapy: Secondary | ICD-10-CM

## 2023-05-27 DIAGNOSIS — E872 Acidosis, unspecified: Secondary | ICD-10-CM | POA: Diagnosis not present

## 2023-05-27 DIAGNOSIS — Z6829 Body mass index (BMI) 29.0-29.9, adult: Secondary | ICD-10-CM

## 2023-05-27 DIAGNOSIS — E1165 Type 2 diabetes mellitus with hyperglycemia: Secondary | ICD-10-CM | POA: Diagnosis not present

## 2023-05-27 DIAGNOSIS — I10 Essential (primary) hypertension: Secondary | ICD-10-CM | POA: Diagnosis present

## 2023-05-27 DIAGNOSIS — E876 Hypokalemia: Secondary | ICD-10-CM | POA: Diagnosis not present

## 2023-05-27 DIAGNOSIS — D688 Other specified coagulation defects: Secondary | ICD-10-CM | POA: Diagnosis present

## 2023-05-27 DIAGNOSIS — R110 Nausea: Secondary | ICD-10-CM | POA: Diagnosis not present

## 2023-05-27 DIAGNOSIS — R109 Unspecified abdominal pain: Secondary | ICD-10-CM | POA: Diagnosis present

## 2023-05-27 DIAGNOSIS — G309 Alzheimer's disease, unspecified: Secondary | ICD-10-CM | POA: Diagnosis present

## 2023-05-27 DIAGNOSIS — F0282 Dementia in other diseases classified elsewhere, unspecified severity, with psychotic disturbance: Secondary | ICD-10-CM | POA: Diagnosis not present

## 2023-05-27 DIAGNOSIS — R197 Diarrhea, unspecified: Secondary | ICD-10-CM | POA: Diagnosis present

## 2023-05-27 DIAGNOSIS — Z9884 Bariatric surgery status: Secondary | ICD-10-CM

## 2023-05-27 DIAGNOSIS — Z885 Allergy status to narcotic agent status: Secondary | ICD-10-CM | POA: Diagnosis not present

## 2023-05-27 DIAGNOSIS — G9341 Metabolic encephalopathy: Secondary | ICD-10-CM | POA: Diagnosis not present

## 2023-05-27 DIAGNOSIS — E785 Hyperlipidemia, unspecified: Secondary | ICD-10-CM | POA: Diagnosis present

## 2023-05-27 DIAGNOSIS — Z8249 Family history of ischemic heart disease and other diseases of the circulatory system: Secondary | ICD-10-CM | POA: Diagnosis not present

## 2023-05-27 DIAGNOSIS — E119 Type 2 diabetes mellitus without complications: Secondary | ICD-10-CM | POA: Diagnosis not present

## 2023-05-27 DIAGNOSIS — N390 Urinary tract infection, site not specified: Secondary | ICD-10-CM | POA: Diagnosis not present

## 2023-05-27 DIAGNOSIS — K432 Incisional hernia without obstruction or gangrene: Secondary | ICD-10-CM | POA: Diagnosis not present

## 2023-05-27 DIAGNOSIS — Z87440 Personal history of urinary (tract) infections: Secondary | ICD-10-CM

## 2023-05-27 DIAGNOSIS — K8689 Other specified diseases of pancreas: Secondary | ICD-10-CM | POA: Diagnosis not present

## 2023-05-27 DIAGNOSIS — R1084 Generalized abdominal pain: Secondary | ICD-10-CM | POA: Diagnosis not present

## 2023-05-27 DIAGNOSIS — Z820 Family history of epilepsy and other diseases of the nervous system: Secondary | ICD-10-CM

## 2023-05-27 DIAGNOSIS — Z87891 Personal history of nicotine dependence: Secondary | ICD-10-CM | POA: Diagnosis not present

## 2023-05-27 DIAGNOSIS — Z8711 Personal history of peptic ulcer disease: Secondary | ICD-10-CM

## 2023-05-27 DIAGNOSIS — Z96651 Presence of right artificial knee joint: Secondary | ICD-10-CM | POA: Diagnosis present

## 2023-05-27 DIAGNOSIS — K255 Chronic or unspecified gastric ulcer with perforation: Secondary | ICD-10-CM | POA: Diagnosis not present

## 2023-05-27 DIAGNOSIS — Z8261 Family history of arthritis: Secondary | ICD-10-CM

## 2023-05-27 DIAGNOSIS — Z9049 Acquired absence of other specified parts of digestive tract: Secondary | ICD-10-CM

## 2023-05-27 DIAGNOSIS — I2510 Atherosclerotic heart disease of native coronary artery without angina pectoris: Secondary | ICD-10-CM | POA: Diagnosis not present

## 2023-05-27 DIAGNOSIS — Z803 Family history of malignant neoplasm of breast: Secondary | ICD-10-CM

## 2023-05-27 DIAGNOSIS — R531 Weakness: Secondary | ICD-10-CM | POA: Diagnosis not present

## 2023-05-27 DIAGNOSIS — G4733 Obstructive sleep apnea (adult) (pediatric): Secondary | ICD-10-CM | POA: Diagnosis present

## 2023-05-27 DIAGNOSIS — Z9889 Other specified postprocedural states: Secondary | ICD-10-CM | POA: Diagnosis not present

## 2023-05-27 DIAGNOSIS — R0902 Hypoxemia: Secondary | ICD-10-CM | POA: Diagnosis not present

## 2023-05-27 DIAGNOSIS — Z833 Family history of diabetes mellitus: Secondary | ICD-10-CM

## 2023-05-27 DIAGNOSIS — Z880 Allergy status to penicillin: Secondary | ICD-10-CM

## 2023-05-27 HISTORY — PX: LAPAROSCOPY: SHX197

## 2023-05-27 LAB — URINALYSIS, ROUTINE W REFLEX MICROSCOPIC
Bilirubin Urine: NEGATIVE
Glucose, UA: >=500 mg/dL — AB
Hgb urine dipstick: NEGATIVE
Ketones, ur: 20 mg/dL — AB
Nitrite: POSITIVE — AB
Protein, ur: NEGATIVE mg/dL
Specific Gravity, Urine: 1.045 — ABNORMAL HIGH (ref 1.005–1.030)
WBC, UA: >50 WBC/hpf (ref 0–5)
pH: 5 (ref 5.0–8.0)

## 2023-05-27 LAB — CBC
HCT: 41.5 % (ref 36.0–46.0)
Hemoglobin: 13.5 g/dL (ref 12.0–15.0)
MCH: 33.6 pg (ref 26.0–34.0)
MCHC: 32.5 g/dL (ref 30.0–36.0)
MCV: 103.2 fL — ABNORMAL HIGH (ref 80.0–100.0)
Platelets: 188 10*3/uL (ref 150–400)
RBC: 4.02 MIL/uL (ref 3.87–5.11)
RDW: 13.9 % (ref 11.5–15.5)
WBC: 12.3 10*3/uL — ABNORMAL HIGH (ref 4.0–10.5)
nRBC: 0 % (ref 0.0–0.2)

## 2023-05-27 LAB — COMPREHENSIVE METABOLIC PANEL WITH GFR
ALT: 16 U/L (ref 0–44)
AST: 25 U/L (ref 15–41)
Albumin: 3.7 g/dL (ref 3.5–5.0)
Alkaline Phosphatase: 46 U/L (ref 38–126)
Anion gap: 11 (ref 5–15)
BUN: 14 mg/dL (ref 8–23)
CO2: 20 mmol/L — ABNORMAL LOW (ref 22–32)
Calcium: 10.1 mg/dL (ref 8.9–10.3)
Chloride: 110 mmol/L (ref 98–111)
Creatinine, Ser: 0.96 mg/dL (ref 0.44–1.00)
GFR, Estimated: >60 mL/min (ref >60)
Glucose, Bld: 176 mg/dL — ABNORMAL HIGH (ref 70–99)
Potassium: 3.8 mmol/L (ref 3.5–5.1)
Sodium: 141 mmol/L (ref 135–145)
Total Bilirubin: 1.1 mg/dL (ref 0.0–1.2)
Total Protein: 6.5 g/dL (ref 6.5–8.1)

## 2023-05-27 LAB — LIPASE, BLOOD: Lipase: 27 U/L (ref 11–51)

## 2023-05-27 LAB — I-STAT CG4 LACTIC ACID, ED: Lactic Acid, Venous: 2.3 mmol/L (ref 0.5–1.9)

## 2023-05-27 LAB — TROPONIN I (HIGH SENSITIVITY)
Troponin I (High Sensitivity): 3 ng/L (ref <18)
Troponin I (High Sensitivity): 4 ng/L (ref <18)

## 2023-05-27 SURGERY — LAPAROSCOPY, DIAGNOSTIC
Anesthesia: General

## 2023-05-27 MED ORDER — ALLOPURINOL 100 MG PO TABS
100.0000 mg | ORAL_TABLET | Freq: Every day | ORAL | Status: DC
  Administered 2023-05-28 – 2023-06-01 (×5): 100 mg via ORAL
  Filled 2023-05-27 (×5): qty 1

## 2023-05-27 MED ORDER — LACTATED RINGERS IV BOLUS
1000.0000 mL | Freq: Once | INTRAVENOUS | Status: AC
  Administered 2023-05-27: 1000 mL via INTRAVENOUS

## 2023-05-27 MED ORDER — PHENYLEPHRINE 80 MCG/ML (10ML) SYRINGE FOR IV PUSH (FOR BLOOD PRESSURE SUPPORT)
PREFILLED_SYRINGE | INTRAVENOUS | Status: AC
  Filled 2023-05-27: qty 10

## 2023-05-27 MED ORDER — ROCURONIUM BROMIDE 10 MG/ML (PF) SYRINGE
PREFILLED_SYRINGE | INTRAVENOUS | Status: DC | PRN
  Administered 2023-05-27 (×2): 20 mg via INTRAVENOUS
  Administered 2023-05-27: 30 mg via INTRAVENOUS
  Administered 2023-05-27: 10 mg via INTRAVENOUS

## 2023-05-27 MED ORDER — SPIRONOLACTONE 25 MG PO TABS
25.0000 mg | ORAL_TABLET | Freq: Every day | ORAL | Status: DC
  Administered 2023-05-28 – 2023-05-31 (×4): 25 mg via ORAL
  Filled 2023-05-27 (×4): qty 1

## 2023-05-27 MED ORDER — PHENYLEPHRINE 80 MCG/ML (10ML) SYRINGE FOR IV PUSH (FOR BLOOD PRESSURE SUPPORT)
PREFILLED_SYRINGE | INTRAVENOUS | Status: DC | PRN
  Administered 2023-05-27 (×2): 160 ug via INTRAVENOUS

## 2023-05-27 MED ORDER — LIDOCAINE HCL (PF) 2 % IJ SOLN
INTRAMUSCULAR | Status: AC
  Filled 2023-05-27: qty 5

## 2023-05-27 MED ORDER — LOSARTAN POTASSIUM 25 MG PO TABS
25.0000 mg | ORAL_TABLET | Freq: Every day | ORAL | Status: DC
  Administered 2023-05-28 – 2023-06-01 (×5): 25 mg via ORAL
  Filled 2023-05-27 (×5): qty 1

## 2023-05-27 MED ORDER — ROCURONIUM BROMIDE 10 MG/ML (PF) SYRINGE
PREFILLED_SYRINGE | INTRAVENOUS | Status: AC
  Filled 2023-05-27: qty 10

## 2023-05-27 MED ORDER — PIPERACILLIN-TAZOBACTAM 3.375 G IVPB: 3.3750 g | Freq: Three times a day (TID) | INTRAVENOUS | Status: DC

## 2023-05-27 MED ORDER — SUCCINYLCHOLINE CHLORIDE 200 MG/10ML IV SOSY
PREFILLED_SYRINGE | INTRAVENOUS | Status: DC | PRN
  Administered 2023-05-27: 120 mg via INTRAVENOUS

## 2023-05-27 MED ORDER — PIPERACILLIN-TAZOBACTAM 3.375 G IVPB 30 MIN
3.3750 g | Freq: Once | INTRAVENOUS | Status: AC
  Administered 2023-05-27: 3.375 g via INTRAVENOUS
  Filled 2023-05-27: qty 50

## 2023-05-27 MED ORDER — PROPOFOL 10 MG/ML IV BOLUS
INTRAVENOUS | Status: AC
  Filled 2023-05-27: qty 20

## 2023-05-27 MED ORDER — DEXAMETHASONE SODIUM PHOSPHATE 10 MG/ML IJ SOLN
INTRAMUSCULAR | Status: AC
  Filled 2023-05-27: qty 1

## 2023-05-27 MED ORDER — ENOXAPARIN SODIUM 40 MG/0.4ML IJ SOSY: 40.0000 mg | PREFILLED_SYRINGE | INTRAMUSCULAR | Status: DC

## 2023-05-27 MED ORDER — ACETAMINOPHEN 650 MG RE SUPP: 650.0000 mg | Freq: Four times a day (QID) | RECTAL | Status: DC | PRN

## 2023-05-27 MED ORDER — FENTANYL CITRATE PF 50 MCG/ML IJ SOSY: 25.0000 ug | PREFILLED_SYRINGE | INTRAMUSCULAR | Status: DC | PRN

## 2023-05-27 MED ORDER — FENTANYL CITRATE (PF) 250 MCG/5ML IJ SOLN
INTRAMUSCULAR | Status: DC | PRN
  Administered 2023-05-27 (×2): 50 ug via INTRAVENOUS

## 2023-05-27 MED ORDER — LIDOCAINE 2% (20 MG/ML) 5 ML SYRINGE
INTRAMUSCULAR | Status: DC | PRN
  Administered 2023-05-27: 100 mg via INTRAVENOUS

## 2023-05-27 MED ORDER — OXYCODONE HCL 5 MG PO TABS: 5.0000 mg | ORAL_TABLET | ORAL | Status: DC | PRN

## 2023-05-27 MED ORDER — ACETAMINOPHEN 325 MG PO TABS: 650.0000 mg | ORAL_TABLET | Freq: Four times a day (QID) | ORAL | Status: DC | PRN

## 2023-05-27 MED ORDER — ONDANSETRON HCL 4 MG/2ML IJ SOLN
INTRAMUSCULAR | Status: AC
  Filled 2023-05-27: qty 2

## 2023-05-27 MED ORDER — 0.9 % SODIUM CHLORIDE (POUR BTL) OPTIME
TOPICAL | Status: DC | PRN
  Administered 2023-05-27: 1000 mL

## 2023-05-27 MED ORDER — LACTATED RINGERS IV SOLN: INTRAVENOUS | Status: DC | PRN

## 2023-05-27 MED ORDER — VISTASEAL 10 ML SINGLE DOSE KIT
10.0000 mL | PACK | Freq: Once | CUTANEOUS | Status: AC
  Administered 2023-05-27: 10 mL via TOPICAL
  Filled 2023-05-27: qty 10

## 2023-05-27 MED ORDER — LACTATED RINGERS IV SOLN: INTRAVENOUS | Status: DC

## 2023-05-27 MED ORDER — ONDANSETRON HCL 4 MG PO TABS: 4.0000 mg | ORAL_TABLET | Freq: Four times a day (QID) | ORAL | Status: DC | PRN

## 2023-05-27 MED ORDER — ACETAMINOPHEN 10 MG/ML IV SOLN
INTRAVENOUS | Status: AC
  Filled 2023-05-27: qty 100

## 2023-05-27 MED ORDER — DROPERIDOL 2.5 MG/ML IJ SOLN: 0.6250 mg | Freq: Once | INTRAMUSCULAR | Status: DC | PRN

## 2023-05-27 MED ORDER — ATORVASTATIN CALCIUM 20 MG PO TABS: 40.0000 mg | ORAL_TABLET | Freq: Every day | ORAL | Status: DC

## 2023-05-27 MED ORDER — HYDROMORPHONE HCL 1 MG/ML IJ SOLN
0.5000 mg | Freq: Once | INTRAMUSCULAR | Status: AC
  Administered 2023-05-27: 0.5 mg via INTRAVENOUS
  Filled 2023-05-27: qty 1

## 2023-05-27 MED ORDER — FENTANYL CITRATE (PF) 100 MCG/2ML IJ SOLN
INTRAMUSCULAR | Status: AC
  Filled 2023-05-27: qty 2

## 2023-05-27 MED ORDER — DEXAMETHASONE SODIUM PHOSPHATE 10 MG/ML IJ SOLN
INTRAMUSCULAR | Status: DC | PRN
  Administered 2023-05-27: 5 mg via INTRAVENOUS

## 2023-05-27 MED ORDER — SUCCINYLCHOLINE CHLORIDE 200 MG/10ML IV SOSY
PREFILLED_SYRINGE | INTRAVENOUS | Status: AC
Start: 2023-05-27 — End: ?
  Filled 2023-05-27: qty 10

## 2023-05-27 MED ORDER — PROPOFOL 10 MG/ML IV BOLUS
INTRAVENOUS | Status: DC | PRN
  Administered 2023-05-27: 140 mg via INTRAVENOUS

## 2023-05-27 MED ORDER — BUPIVACAINE-EPINEPHRINE (PF) 0.25% -1:200000 IJ SOLN
INTRAMUSCULAR | Status: AC
  Filled 2023-05-27: qty 30

## 2023-05-27 MED ORDER — ONDANSETRON HCL 4 MG/2ML IJ SOLN: 4.0000 mg | Freq: Four times a day (QID) | INTRAMUSCULAR | Status: DC | PRN

## 2023-05-27 MED ORDER — IOHEXOL 350 MG/ML SOLN
100.0000 mL | Freq: Once | INTRAVENOUS | Status: AC | PRN
  Administered 2023-05-27: 100 mL via INTRAVENOUS

## 2023-05-27 MED ORDER — OXYCODONE-ACETAMINOPHEN 5-325 MG PO TABS
1.0000 | ORAL_TABLET | ORAL | Status: DC | PRN
  Administered 2023-05-27: 1 via ORAL
  Filled 2023-05-27: qty 1

## 2023-05-27 SURGICAL SUPPLY — 60 items
APPLICATOR VISTASEAL 35 (MISCELLANEOUS) IMPLANT
APPLIER CLIP 5 13 M/L LIGAMAX5 (MISCELLANEOUS) IMPLANT
APPLIER CLIP ROT 10 11.4 M/L (STAPLE) IMPLANT
BAG COUNTER SPONGE SURGICOUNT (BAG) IMPLANT
BLADE EXTENDED COATED 6.5IN (ELECTRODE) IMPLANT
BLADE SURG SZ10 CARB STEEL (BLADE) IMPLANT
CELLS DAT CNTRL 66122 CELL SVR (MISCELLANEOUS) IMPLANT
CHLORAPREP W/TINT 26 (MISCELLANEOUS) ×2 IMPLANT
CLIP APPLIE 5 13 M/L LIGAMAX5 (MISCELLANEOUS) IMPLANT
CLIP APPLIE ROT 10 11.4 M/L (STAPLE) IMPLANT
COVER MAYO STAND STRL (DRAPES) IMPLANT
COVER SURGICAL LIGHT HANDLE (MISCELLANEOUS) ×2 IMPLANT
DERMABOND ADVANCED .7 DNX12 (GAUZE/BANDAGES/DRESSINGS) IMPLANT
DRAIN CHANNEL 19F RND (DRAIN) IMPLANT
DRAPE SHEET LG 3/4 BI-LAMINATE (DRAPES) IMPLANT
DRAPE WARM FLUID 44X44 (DRAPES) IMPLANT
ELECT REM PT RETURN 15FT ADLT (MISCELLANEOUS) ×2 IMPLANT
EVACUATOR SILICONE 100CC (DRAIN) IMPLANT
GAUZE SPONGE 4X4 12PLY STRL (GAUZE/BANDAGES/DRESSINGS) ×2 IMPLANT
GLOVE BIO SURGEON STRL SZ7.5 (GLOVE) ×2 IMPLANT
GLOVE INDICATOR 8.0 STRL GRN (GLOVE) ×2 IMPLANT
GOWN STRL REUS W/ TWL XL LVL3 (GOWN DISPOSABLE) ×8 IMPLANT
GRASPER SUT TROCAR 14GX15 (MISCELLANEOUS) IMPLANT
HANDLE SUCTION POOLE (INSTRUMENTS) IMPLANT
IRRIG SUCT STRYKERFLOW 2 WTIP (MISCELLANEOUS) ×1 IMPLANT
IRRIGATION SUCT STRKRFLW 2 WTP (MISCELLANEOUS) IMPLANT
KIT BASIN OR (CUSTOM PROCEDURE TRAY) ×2 IMPLANT
KIT TURNOVER KIT A (KITS) IMPLANT
LEGGING LITHOTOMY PAIR STRL (DRAPES) IMPLANT
RETRACTOR WND ALEXIS 18 MED (MISCELLANEOUS) IMPLANT
RTRCTR WOUND ALEXIS 18CM MED (MISCELLANEOUS) IMPLANT
SCISSORS LAP 5X35 DISP (ENDOMECHANICALS) ×2 IMPLANT
SHEARS HARMONIC 36 ACE (MISCELLANEOUS) IMPLANT
SLEEVE Z-THREAD 5X100MM (TROCAR) ×2 IMPLANT
SPIKE FLUID TRANSFER (MISCELLANEOUS) ×2 IMPLANT
STAPLER SKIN PROX WIDE 3.9 (STAPLE) IMPLANT
STRIP CLOSURE SKIN 1/2X4 (GAUZE/BANDAGES/DRESSINGS) IMPLANT
SUCTION POOLE HANDLE (INSTRUMENTS) IMPLANT
SUT ETHILON 2 0 PS N (SUTURE) IMPLANT
SUT MNCRL AB 4-0 PS2 18 (SUTURE) IMPLANT
SUT PDS AB 1 TP1 96 (SUTURE) IMPLANT
SUT PROLENE 2 0 KS (SUTURE) IMPLANT
SUT PROLENE 2 0 SH DA (SUTURE) IMPLANT
SUT SILK 2 0 SH (SUTURE) IMPLANT
SUT SILK 2 0 SH CR/8 (SUTURE) IMPLANT
SUT SILK 2-0 18XBRD TIE 12 (SUTURE) IMPLANT
SUT SILK 3 0 SH CR/8 (SUTURE) IMPLANT
SUT SILK 3-0 18XBRD TIE 12 (SUTURE) IMPLANT
SUT VICRYL 0 TIES 12 18 (SUTURE) IMPLANT
SYR BULB IRRIG 60ML STRL (SYRINGE) IMPLANT
SYS LAPSCP GELPORT 120MM (MISCELLANEOUS) IMPLANT
SYSTEM LAPSCP GELPORT 120MM (MISCELLANEOUS) IMPLANT
TOWEL OR 17X26 10 PK STRL BLUE (TOWEL DISPOSABLE) ×2 IMPLANT
TRAY FOLEY MTR SLVR 16FR STAT (SET/KITS/TRAYS/PACK) ×2 IMPLANT
TRAY LAPAROSCOPIC (CUSTOM PROCEDURE TRAY) ×2 IMPLANT
TROCAR 11X100 Z THREAD (TROCAR) IMPLANT
TROCAR BALLN 12MMX100 BLUNT (TROCAR) IMPLANT
TROCAR Z-THREAD FIOS 5X100MM (TROCAR) IMPLANT
TROCAR Z-THREAD OPTICAL 5X100M (TROCAR) ×2 IMPLANT
YANKAUER SUCT BULB TIP NO VENT (SUCTIONS) IMPLANT

## 2023-05-27 NOTE — Anesthesia Procedure Notes (Signed)
 Procedure Name: Intubation Date/Time: 05/27/2023 10:50 PM  Performed by: Stasia Edelman, CRNAPre-anesthesia Checklist: Patient identified, Emergency Drugs available, Suction available and Patient being monitored Patient Re-evaluated:Patient Re-evaluated prior to induction Oxygen Delivery Method: Circle System Utilized Preoxygenation: Pre-oxygenation with 100% oxygen Induction Type: IV induction, Rapid sequence and Cricoid Pressure applied Laryngoscope Size: Mac and 3 Grade View: Grade I Tube type: Oral Tube size: 7.0 mm Number of attempts: 1 Airway Equipment and Method: Stylet Placement Confirmation: ETT inserted through vocal cords under direct vision, positive ETCO2 and breath sounds checked- equal and bilateral Secured at: 22 cm Tube secured with: Tape Dental Injury: Teeth and Oropharynx as per pre-operative assessment

## 2023-05-27 NOTE — ED Triage Notes (Addendum)
 Pt BIB [facility] from home with reports of mid abdominal pain that radiated to the left shoulder. Pt further reports diarrhea and weakness x 3-4 days. Pt was given 4mg  zofran , 100mcg fentanyl , and 500cc saline bolus.

## 2023-05-27 NOTE — ED Provider Triage Note (Signed)
 Emergency Medicine Provider Triage Evaluation Note  Amanda [name] , a 68 y.o. female  was evaluated in triage.  Pt complains of cp, abd pain. Dark stool, black diarrhea, hurts to breath. Hx of roux n y. No nsaids. Nausea and emesis for 1 week  Review of Systems  Positive: Cp,abd pain Negative:   Physical Exam  There were no vitals taken for this visit. Gen:   Awake, no distress   Resp:  Normal effort  MSK:   Moves extremities without difficulty  Other:    Medical Decision Making  Medically screening exam initiated at 6:12 PM.  Appropriate orders placed.  Amanda [name] was informed that the remainder of the evaluation will be completed by another provider, this initial triage assessment does not replace that evaluation, and the importance of remaining in the ED until their evaluation is complete.  pain   Adhrit Krenz A, PA-C 05/27/23 1814

## 2023-05-27 NOTE — ED Notes (Signed)
 Pt. CG4 Lactic Acid 2.3, EDP, Pfeiffer,MD. Made aware.

## 2023-05-27 NOTE — Anesthesia Preprocedure Evaluation (Addendum)
 Anesthesia Evaluation  Patient identified by MRN, date of birth, ID band Patient awake    Reviewed: Allergy & Precautions, NPO status , Patient's Chart, lab work & pertinent test results  History of Anesthesia Complications Negative for: history of anesthetic complications  Airway Mallampati: II  TM Distance: >3 FB Neck ROM: Full    Dental no notable dental hx.    Pulmonary former smoker   Pulmonary exam normal        Cardiovascular hypertension, Normal cardiovascular exam     Neuro/Psych negative neurological ROS  negative psych ROS   GI/Hepatic Neg liver ROS,,,Roux-en-Y 2014, gastric perforation   Endo/Other  diabetes, Type 2, Oral Hypoglycemic Agents    Renal/GU negative Renal ROS  negative genitourinary   Musculoskeletal  (+) Arthritis ,    Abdominal   Peds  Hematology negative hematology ROS (+)   Anesthesia Other Findings Day of surgery medications reviewed with patient.  Reproductive/Obstetrics negative OB ROS                             Anesthesia Physical Anesthesia Plan  ASA: 2 and emergent  Anesthesia Plan: General   Post-op Pain Management: Ofirmev  IV (intra-op)*   Induction: Intravenous  PONV Risk Score and Plan: 3 and Treatment may vary due to age or medical condition, Ondansetron , Dexamethasone  and Midazolam   Airway Management Planned: Oral ETT  Additional Equipment: None  Intra-op Plan:   Post-operative Plan: Extubation in OR  Informed Consent: I have reviewed the patients History and Physical, chart, labs and discussed the procedure including the risks, benefits and alternatives for the proposed anesthesia with the patient or authorized representative who has indicated his/her understanding and acceptance.     Dental advisory given  Plan Discussed with: CRNA  Anesthesia Plan Comments:        Anesthesia Quick Evaluation

## 2023-05-27 NOTE — Consult Note (Signed)
 CC: abdominal pain  Requesting provider: Dr Daivd Dub  HPI: Amanda [name] is an 68 y.o. female with past medical history of hypertension, hyperlipidemia, diabetes mellitus type 2 and a remote history of laparoscopic gastric bypass at [facility] in 2013 by Dr. Sueanne Emerald comes in with acute onset of "severe "upper abdominal pain.  She states that it acutely happened this afternoon while she was moving around the house.  She denies any prior symptoms.  The pain was so severe prompted her to go to the emergency room.  She had some nausea for the past several weeks but no fever or chills.  No emesis per se.  She may have had some white foamy's over the past few weeks intermittently.  She states that she has been having diarrhea at nights for the past month or so.  She describes it as oatmeal consistency.  No melena or hematochezia.  She states that she does take her multivitamin and calcium  supplements as directed.  She denies any NSAID use.  She denies any tobacco use.  She denies any postprandial pain over the past few weeks.  She has just been having intermittent nausea at times.  She reports a little bit of dragging her left foot over the past few months.  She also reports some numbness and tingling 100 fingers which prompted a carotid ultrasound recently which was normal.  She denies any personal blood clots in her life.  She has had a prior open cholecystectomy in addition to her laparoscopic gastric bypass as well as a C-section  Past Medical History:  Diagnosis Date   Allergy    seasonal   Arthritis    all over   Blood dyscrasia    Carrys trait for Leiden Factor five never had any issues . Yhe only reason she was tested was because her mother had it   Diabetes mellitus without complication (HCC)    Pre surgery - Gastric sx 2014 Roun-Y no diabetes since  no meds   Fatty liver 05/2006   by US    Gout    Hip pain    Hypertension    Knee pain    Obesity    Pneumonia    Recurrent UTI     Sleep apnea    had roux en y lost 140lbs no longer needs cpap    Past Surgical History:  Procedure Laterality Date   APPENDECTOMY  1977   CESAREAN SECTION  1989   one   CHOLECYSTECTOMY  1977   DILATION AND CURETTAGE OF UTERUS  1987 & 1988   REDUCTION MAMMAPLASTY Bilateral 1985   ROUX-EN-Y GASTRIC BYPASS  2014   TOTAL KNEE ARTHROPLASTY Right 07/21/2019   Procedure: TOTAL KNEE ARTHROPLASTY;  Surgeon: Liliane Rei, MD;  Location: [facility];  Service: Orthopedics;  Laterality: Right;     Family History  Problem Relation Age of Onset   Breast cancer Mother    Diabetes Mother    Hypertension Mother    Cancer Mother        breast- partial mastectomy- in her 24's   Heart attack Father    Hypertension Father    Arthritis Father    Gout Father    Parkinsonism Father    Parkinson's disease Father    Alcohol abuse Maternal Grandfather    Healthy Child    Colon cancer Neg Hx    Esophageal cancer Neg Hx    Stomach cancer Neg Hx    Rectal cancer Neg Hx  Social:  reports that she quit smoking about 24 years ago. Her smoking use included cigarettes. She started smoking about 39 years ago. She has never used smokeless tobacco. She reports current alcohol use. She reports that she does not use drugs.  Allergies:  Allergies  Allergen Reactions   Fenofibrate Other (See Comments)    Constipation   Morphine  Nausea And Vomiting    Reports she is not allergic.    Penicillins      Reports she is not allergic.    Sulfonamide Derivatives     Reports she is not allergic.    Mounjaro  [Tirzepatide ]     Increase in appetite and cravings.    Ozempic  (0.25 Or 0.5 Mg-Dose) [Semaglutide (0.25 Or 0.5mg -Dos)]     nausea    Medications: I have reviewed the patient's current medications.   ROS - all of the below systems have been reviewed with the patient and positives are indicated with bold text General: chills, fever or night sweats Eyes: blurry vision or double vision ENT:  epistaxis or sore throat Allergy/Immunology: itchy/watery eyes or nasal congestion Hematologic/Lymphatic: bleeding problems, blood clots or swollen lymph nodes Endocrine: temperature intolerance or unexpected weight changes Breast: new or changing breast lumps or nipple discharge Resp: cough, shortness of breath, or wheezing CV: chest pain or dyspnea on exertion GI: as per HPI GU: dysuria, trouble voiding, or hematuria MSK: joint pain or joint stiffness Neuro: TIA or stroke symptoms Derm: pruritus and skin lesion changes Psych: anxiety and depression  PE Blood pressure (!) 161/87, pulse (!) 102, temperature 98 F (36.7 C), resp. rate 19, SpO2 92%. Constitutional: NAD; conversant; no deformities Eyes: Moist conjunctiva; no lid lag; anicteric; PERRL Neck: Trachea midline; no thyromegaly Lungs: Normal respiratory effort; no tactile fremitus CV: RRR; no palpable thrills; no pitting edema GI: Abd soft, old right subcostal incision, lower midline incision, old trocar scars.  Tender to palpation in the upper abdomen, positive voluntary guarding; no palpable hepatosplenomegaly MSK:  no clubbing/cyanosis Psychiatric: Appropriate affect; alert and oriented x3; has had some pain medicine Lymphatic: No palpable cervical or axillary lymphadenopathy Skin: No rash, lesions or jaundice  Results for orders placed or performed during the hospital encounter of 05/27/23 (from the past 48 hours)  Lipase, blood     Status: None   Collection Time: 05/27/23  6:05 PM  Result Value Ref Range   Lipase 27 11 - 51 U/L    Comment: Performed at [facility], [address], [place], [place] [address]  Comprehensive metabolic panel     Status: Abnormal   Collection Time: 05/27/23  6:05 PM  Result Value Ref Range   Sodium 141 135 - 145 mmol/L   Potassium 3.8 3.5 - 5.1 mmol/L   Chloride 110 98 - 111 mmol/L   CO2 20 (L) 22 - 32 mmol/L   Glucose, Bld 176 (H) 70 - 99 mg/dL    Comment: Glucose  reference range applies only to samples taken after fasting for at least 8 hours.   BUN 14 8 - 23 mg/dL   Creatinine, Ser 6.04 0.44 - 1.00 mg/dL   Calcium  10.1 8.9 - 10.3 mg/dL   Total Protein 6.5 6.5 - 8.1 g/dL   Albumin 3.7 3.5 - 5.0 g/dL   AST 25 15 - 41 U/L   ALT 16 0 - 44 U/L   Alkaline Phosphatase 46 38 - 126 U/L   Total Bilirubin 1.1 0.0 - 1.2 mg/dL   GFR, Estimated >54 >09 mL/min  Comment: (NOTE) Calculated using the CKD-EPI Creatinine Equation (2021)    Anion gap 11 5 - 15    Comment: Performed at [facility], [address], [place], [place] [address]  CBC     Status: Abnormal   Collection Time: 05/27/23  6:05 PM  Result Value Ref Range   WBC 12.3 (H) 4.0 - 10.5 K/uL   RBC 4.02 3.87 - 5.11 MIL/uL   Hemoglobin 13.5 12.0 - 15.0 g/dL   HCT 46.9 62.9 - 52.8 %   MCV 103.2 (H) 80.0 - 100.0 fL   MCH 33.6 26.0 - 34.0 pg   MCHC 32.5 30.0 - 36.0 g/dL   RDW 41.3 24.4 - 01.0 %   Platelets 188 150 - 400 K/uL   nRBC 0.0 0.0 - 0.2 %    Comment: Performed at [facility], [address], [place], [place] [address]  Troponin I (High Sensitivity)     Status: None   Collection Time: 05/27/23  6:05 PM  Result Value Ref Range   Troponin I (High Sensitivity) 3 <18 ng/L    Comment: (NOTE) Elevated high sensitivity troponin I (hsTnI) values and significant  changes across serial measurements may suggest ACS but many other  chronic and acute conditions are known to elevate hsTnI results.  Refer to the "Links" section for chest pain algorithms and additional  guidance. Performed at [facility], [address], [place], [place] [address]   Troponin I (High Sensitivity)     Status: None   Collection Time: 05/27/23  7:30 PM  Result Value Ref Range   Troponin I (High Sensitivity) 4 <18 ng/L    Comment: (NOTE) Elevated high sensitivity troponin I (hsTnI) values and significant  changes across serial measurements may suggest ACS  but many other  chronic and acute conditions are known to elevate hsTnI results.  Refer to the "Links" section for chest pain algorithms and additional  guidance. Performed at [facility], [address], [place], [place] [address]   Urinalysis, Routine w reflex microscopic -Urine, Clean Catch     Status: Abnormal   Collection Time: 05/27/23  8:59 PM  Result Value Ref Range   Color, Urine YELLOW YELLOW   APPearance CLOUDY (A) CLEAR   Specific Gravity, Urine 1.045 (H) 1.005 - 1.030   pH 5.0 5.0 - 8.0   Glucose, UA >=500 (A) NEGATIVE mg/dL   Hgb urine dipstick NEGATIVE NEGATIVE   Bilirubin Urine NEGATIVE NEGATIVE   Ketones, ur 20 (A) NEGATIVE mg/dL   Protein, ur NEGATIVE NEGATIVE mg/dL   Nitrite POSITIVE (A) NEGATIVE   Leukocytes,Ua MODERATE (A) NEGATIVE   RBC / HPF 6-10 0 - 5 RBC/hpf   WBC, UA >50 0 - 5 WBC/hpf   Bacteria, UA RARE (A) NONE SEEN   Squamous Epithelial / HPF 6-10 0 - 5 /HPF    Comment: Performed at [facility], [address] [place], [place] [address]  I-Stat Lactic Acid     Status: Abnormal   Collection Time: 05/27/23  9:17 PM  Result Value Ref Range   Lactic Acid, Venous 2.3 (HH) 0.5 - 1.9 mmol/L   Comment NOTIFIED PHYSICIAN     CT Angio Chest PE W and/or Wo Contrast Addendum Date: 05/27/2023 ADDENDUM REPORT: 05/27/2023 20:47 ADDENDUM: Addendum to include additional impression point. 7. Large amount of gas within the bladder. Correlate for recent instrumentation. Electronically Signed   By: Rozell Cornet M.D.   On: 05/27/2023  20:47   Result Date: 05/27/2023 CLINICAL DATA:  Mid abdominal pain radiating to the left shoulder. Diarrhea and weakness for 3-4 days. PE suspected. EXAM: CT ANGIOGRAPHY CHEST CT ABDOMEN AND PELVIS WITH CONTRAST TECHNIQUE: Multidetector CT imaging of the chest was performed using the standard protocol during bolus administration of intravenous contrast. Multiplanar CT image reconstructions and MIPs  were obtained to evaluate the vascular anatomy. Multidetector CT imaging of the abdomen and pelvis was performed using the standard protocol during bolus administration of intravenous contrast. RADIATION DOSE REDUCTION: This exam was performed according to the departmental dose-optimization program which includes automated exposure control, adjustment of the mA and/or kV according to patient size and/or use of iterative reconstruction technique. CONTRAST:  OMNIPAQUE  IOHEXOL  350 MG/ML SOLN COMPARISON:  Radiographs 09/15/2022; FINDINGS: CTA CHEST FINDINGS Cardiovascular: Normal heart size. No pericardial effusion. Coronary artery and aortic atherosclerotic calcification. Negative for acute pulmonary embolism. Mediastinum/Nodes: Trachea and esophagus are unremarkable. No thoracic adenopathy. Lungs/Pleura: Lungs are clear. No pleural effusion or pneumothorax. Musculoskeletal: No acute fracture. Review of the MIP images confirms the above findings. CT ABDOMEN and PELVIS FINDINGS Hepatobiliary: Hepatic steatosis. Cholecystectomy. No biliary dilation. Pancreas: Pancreatic atrophy. No ductal dilation. 1.1 cm cystic lesion in the pancreatic tail (series 4/image 25). Spleen: Unremarkable. Adrenals/Urinary Tract: Normal adrenal glands. No urinary calculi or hydronephrosis. Cortical calcification in the posterior left kidney. Large amount of gas within the bladder. Stomach/Bowel: Postoperative change of Roux-en-Y gastric bypass. Small amount of fluid and tiny locule of free intraperitoneal air extending from the gastro jejunal anastomosis (series 8/image 108). Mild wall thickening and mucosal hyperenhancement about the jejunum immediately distal to the gastrojejunal anastomosis. No bowel obstruction.  The appendix is not visualized. Vascular/Lymphatic: Aortic atherosclerosis. No enlarged abdominal or pelvic lymph nodes. Reproductive: Uterus and bilateral adnexa are unremarkable. Other: Free fluid and tiny locule of free  intraperitoneal air about the gastro jejunal anastomosis in the left upper quadrant. No abscess. Musculoskeletal: No acute fracture. Review of the MIP images confirms the above findings. IMPRESSION: 1. Postoperative change of Roux-en-Y gastric bypass. Free fluid and tiny locule of intraperitoneal gas about the gastro jejunal anastomosis in the left upper quadrant. Findings are concerning for anastomotic dehiscence. 2. Enteritis about the jejunum distal to the gastro jejunal anastomosis. 3. No pulmonary embolism or acute abnormality in the chest. 4. Hepatic steatosis. 5. 1.1 cm cystic lesion in the pancreatic tail. Most likely a benign IPMN or pseudocyst. Follow-up pancreatic protocol CT or MRI in 2 years is recommended. 6.  Aortic Atherosclerosis (ICD10-I70.0). Critical Value/emergent results were called by telephone at the time of interpretation on 05/27/2023 at 8:25 pm to provider Dr. Jennetta Modena, who verbally acknowledged these results. Electronically Signed: By: Rozell Cornet M.D. On: 05/27/2023 20:32   CT ABDOMEN PELVIS W CONTRAST Addendum Date: 05/27/2023 ADDENDUM REPORT: 05/27/2023 20:47 ADDENDUM: Addendum to include additional impression point. 7. Large amount of gas within the bladder. Correlate for recent instrumentation. Electronically Signed   By: Rozell Cornet M.D.   On: 05/27/2023 20:47   Result Date: 05/27/2023 CLINICAL DATA:  Mid abdominal pain radiating to the left shoulder. Diarrhea and weakness for 3-4 days. PE suspected. EXAM: CT ANGIOGRAPHY CHEST CT ABDOMEN AND PELVIS WITH CONTRAST TECHNIQUE: Multidetector CT imaging of the chest was performed using the standard protocol during bolus administration of intravenous contrast. Multiplanar CT image reconstructions and MIPs were obtained to evaluate the vascular anatomy. Multidetector CT imaging of the abdomen and pelvis was performed using the standard protocol during bolus administration  of intravenous contrast. RADIATION DOSE REDUCTION: This exam  was performed according to the departmental dose-optimization program which includes automated exposure control, adjustment of the mA and/or kV according to patient size and/or use of iterative reconstruction technique. CONTRAST:  OMNIPAQUE  IOHEXOL  350 MG/ML SOLN COMPARISON:  Radiographs 09/15/2022; FINDINGS: CTA CHEST FINDINGS Cardiovascular: Normal heart size. No pericardial effusion. Coronary artery and aortic atherosclerotic calcification. Negative for acute pulmonary embolism. Mediastinum/Nodes: Trachea and esophagus are unremarkable. No thoracic adenopathy. Lungs/Pleura: Lungs are clear. No pleural effusion or pneumothorax. Musculoskeletal: No acute fracture. Review of the MIP images confirms the above findings. CT ABDOMEN and PELVIS FINDINGS Hepatobiliary: Hepatic steatosis. Cholecystectomy. No biliary dilation. Pancreas: Pancreatic atrophy. No ductal dilation. 1.1 cm cystic lesion in the pancreatic tail (series 4/image 25). Spleen: Unremarkable. Adrenals/Urinary Tract: Normal adrenal glands. No urinary calculi or hydronephrosis. Cortical calcification in the posterior left kidney. Large amount of gas within the bladder. Stomach/Bowel: Postoperative change of Roux-en-Y gastric bypass. Small amount of fluid and tiny locule of free intraperitoneal air extending from the gastro jejunal anastomosis (series 8/image 108). Mild wall thickening and mucosal hyperenhancement about the jejunum immediately distal to the gastrojejunal anastomosis. No bowel obstruction.  The appendix is not visualized. Vascular/Lymphatic: Aortic atherosclerosis. No enlarged abdominal or pelvic lymph nodes. Reproductive: Uterus and bilateral adnexa are unremarkable. Other: Free fluid and tiny locule of free intraperitoneal air about the gastro jejunal anastomosis in the left upper quadrant. No abscess. Musculoskeletal: No acute fracture. Review of the MIP images confirms the above findings. IMPRESSION: 1. Postoperative change of  Roux-en-Y gastric bypass. Free fluid and tiny locule of intraperitoneal gas about the gastro jejunal anastomosis in the left upper quadrant. Findings are concerning for anastomotic dehiscence. 2. Enteritis about the jejunum distal to the gastro jejunal anastomosis. 3. No pulmonary embolism or acute abnormality in the chest. 4. Hepatic steatosis. 5. 1.1 cm cystic lesion in the pancreatic tail. Most likely a benign IPMN or pseudocyst. Follow-up pancreatic protocol CT or MRI in 2 years is recommended. 6.  Aortic Atherosclerosis (ICD10-I70.0). Critical Value/emergent results were called by telephone at the time of interpretation on 05/27/2023 at 8:25 pm to provider Dr. Jennetta Modena, who verbally acknowledged these results. Electronically Signed: By: Rozell Cornet M.D. On: 05/27/2023 20:32    Imaging: Personally reviewed  A/P: Amanda [name] is an 68 y.o. female History of laparoscopic gastric bypass at [facility] September 2013 Dr. Clary Crown ulcer perforation Diabetes mellitus type 2 1 cm pancreatic tail cystic lesion Hypertension Hyperlipidemia Diarrhea   Patient has what appears to be a marginal ulcer perforation on the jejunal aspect of her Roux limb.  Despite looking in care everywhere her operative note from 2013 is unavailable.  It is unclear whether or not she had a "sleeve bypass "which sometimes the surgeon performed.  I have recommended diagnostic laparoscopy, laparoscopic Graham patch repair, possible gastrotomy tube placement.  We discussed steps of the procedure.  I discussed the typical hospitalization.  We discussed typical recovery.  We discussed the risk including not limited to bleeding, infection, injury to surrounding structures, perioperative cardiac and pulmonary events, abscess formation, leak, persistent ulcer needing future surgeries, potential issues with G-tube if we end up having to place it.  We generally only place it tonight if there is a large perforation.  Explained that  we do not generally redo the anastomosis in this setting because of the high risk of anastomotic leak.  It is possible her several week history of intermittent nausea was due to  her ulcer Diarrhea could also be related to her ulcer although there other etiologies as well.  Patient denies tobacco and NSAID use.  IV antibiotic Continue multivitamin postop/thiamine Patient will need follow-up CT pancreatic protocol in 2 years to follow-up the pancreatic tail cystic lesion  High Medical decision making  Data reviewed: [facility] Dr Sueanne Emerald encounter 10/31/11; labs today including labs from February 2025 and November 2024 and January 2024; carotid ultrasound from 2025; PCP office note February 2025 Marianna Shirk. Elvan Hamel, MD, FACS General, Bariatric, & Minimally Invasive Surgery [facility]

## 2023-05-27 NOTE — H&P (Signed)
 History and Physical    Amanda [name] ZOX:[id number] DOB: 12-07-1955 DOA: 05/27/2023  PCP: Araceli Knight, PA-C   Chief Complaint:  abd pain  HPI: Amanda [name] is a 68 y.o. female with medical history significant of diabetes, obesity who presents emergency department due to diarrhea and acute onset severe abdominal pain.  Patient states that afternoon she developed abrupt severe epigastric abdominal pain that is sharp in nature with radiation throughout her abdomen.  She had a history of Roux-en-Y gastric bypass 10 years ago.  She presented to the ER where she was found to be afebrile and hemodynamically stable.  Labs were [facility] presentation which showed lipase within normal limits, sodium 141, creatinine 0.96, lactic acid 2.3, WBC 12.3, hemoglobin 13.5, troponin 3, urinalysis positive for infection.  Patient underwent CT abdomen pelvis which showed large amount of gas within the bladder with intraperitoneal gas concerning for anastomotic dehiscence.  There is also a 1.1 cm pancreatic tail lesion.  Patient went CTA pulmonary embolism study which showed no evidence of PE.  Surgery was consulted who recommended diagnostic laparoscopy for further assessment of imaging findings.  She denied NSAID usage.   Review of Systems: Review of Systems  Constitutional:  Negative for chills and fever.  HENT: Negative.    Eyes: Negative.   Respiratory: Negative.    Cardiovascular: Negative.   Gastrointestinal: Negative.   Genitourinary: Negative.   Musculoskeletal: Negative.   Skin: Negative.   Neurological: Negative.   Endo/Heme/Allergies: Negative.   Psychiatric/Behavioral: Negative.       As per HPI otherwise 10 point review of systems negative.   Allergies  Allergen Reactions   Fenofibrate Other (See Comments)    Constipation   Morphine  Nausea And Vomiting    Reports she is not allergic.    Penicillins      Reports she is not allergic.    Sulfonamide Derivatives     Reports she  is not allergic.    Mounjaro  [Tirzepatide ]     Increase in appetite and cravings.    Ozempic  (0.25 Or 0.5 Mg-Dose) [Semaglutide (0.25 Or 0.5mg -Dos)]     nausea    Past Medical History:  Diagnosis Date   Allergy    seasonal   Arthritis    all over   Blood dyscrasia    Carrys trait for Leiden Factor five never had any issues . Yhe only reason she was tested was because her mother had it   Diabetes mellitus without complication (HCC)    Pre surgery - Gastric sx 2014 Roun-Y no diabetes since  no meds   Fatty liver 05/2006   by US    Gout    Hip pain    Hypertension    Knee pain    Obesity    Pneumonia    Recurrent UTI    Sleep apnea    had roux en y lost 140lbs no longer needs cpap    Past Surgical History:  Procedure Laterality Date   APPENDECTOMY  1977   CESAREAN SECTION  1989   one   CHOLECYSTECTOMY  1977   DILATION AND CURETTAGE OF UTERUS  1987 & 1988   REDUCTION MAMMAPLASTY Bilateral 1985   ROUX-EN-Y GASTRIC BYPASS  2014   TOTAL KNEE ARTHROPLASTY Right 07/21/2019   Procedure: TOTAL KNEE ARTHROPLASTY;  Surgeon: Liliane Rei, MD;  Location: [facility];  Service: Orthopedics;  Laterality: Right;      reports that she quit smoking about 24 years ago. Her smoking use included cigarettes.  She started smoking about 39 years ago. She has never used smokeless tobacco. She reports current alcohol use. She reports that she does not use drugs.  Family History  Problem Relation Age of Onset   Breast cancer Mother    Diabetes Mother    Hypertension Mother    Cancer Mother        breast- partial mastectomy- in her 66's   Heart attack Father    Hypertension Father    Arthritis Father    Gout Father    Parkinsonism Father    Parkinson's disease Father    Alcohol abuse Maternal Grandfather    Healthy Child    Colon cancer Neg Hx    Esophageal cancer Neg Hx    Stomach cancer Neg Hx    Rectal cancer Neg Hx     Prior to Admission medications   Medication Sig Start  Date End Date Taking? Authorizing Provider  acetaminophen  (TYLENOL ) 500 MG tablet Take 500 mg by mouth every 6 (six) hours as needed for moderate pain (pain score 4-6).   Yes [provider]  alendronate  (FOSAMAX ) 70 MG tablet Take 1 tablet (70 mg total) by mouth every 7 (seven) days. 01/26/23  Yes Breeback, Jade L, PA-C  allopurinol  (ZYLOPRIM ) 100 MG tablet Take 1 tablet (100 mg total) by mouth daily. Needs appt Patient taking differently: Take 100 mg by mouth every evening. Needs appt 02/14/23  Yes Breeback, Jade L, PA-C  atorvastatin  (LIPITOR) 40 MG tablet Take 1 tablet (40 mg total) by mouth daily. 07/04/22  Yes Breeback, Jade L, PA-C  calcium  citrate (CALCITRATE - DOSED IN MG ELEMENTAL CALCIUM ) 950 (200 Ca) MG tablet Take 200 mg of elemental calcium  by mouth daily.   Yes [provider]  dapagliflozin  propanediol (FARXIGA ) 10 MG TABS tablet Take 1 tablet (10 mg total) by mouth daily. 04/10/23  Yes Breeback, Jade L, PA-C  ferrous sulfate 324 MG TBEC Take 324 mg by mouth every evening.   Yes [provider]  gabapentin  (NEURONTIN ) 300 MG capsule Take 1-2 capsules (300-600 mg total) by mouth 3 (three) times daily. 01/26/23  Yes Breeback, Jade L, PA-C  ibuprofen (ADVIL) 800 MG tablet Take 800 mg by mouth every 8 (eight) hours as needed.   Yes [provider]  losartan  (COZAAR ) 25 MG tablet Take 1 tablet (25 mg total) by mouth daily. Patient taking differently: Take 25 mg by mouth every evening. 04/25/23  Yes Breeback, Jade L, PA-C  metFORMIN  (GLUCOPHAGE ) 1000 MG tablet Take 1 tablet (1,000 mg total) by mouth 2 (two) times daily with a meal. 01/08/23  Yes Breeback, Jade L, PA-C  Multiple Vitamin (MULTI-VITAMIN) tablet Take 1 tablet by mouth in the morning and at bedtime.   Yes [provider]  nitrofurantoin  (MACRODANTIN ) 50 MG capsule Take 1 capsule by mouth daily. 05/26/22  Yes   spironolactone  (ALDACTONE ) 25 MG tablet Take 1 tablet (25 mg total) by mouth  daily. Patient taking differently: Take 25 mg by mouth every evening. 01/26/23  Yes Breeback, Jade L, PA-C  Accu-Chek Softclix Lancets lancets Use up to four times daily as directed 07/08/21     blood glucose meter kit and supplies Use up to four times daily as directed 07/08/21   Breeback, Jade L, PA-C  Continuous Glucose Sensor (FREESTYLE LIBRE 14 DAY SENSOR) MISC Apply to upper deltoid every 14 days. Use reader to determine blood sugars 05/01/23   Breeback, Jade L, PA-C  estradiol  (ESTRACE  VAGINAL) 0.1 MG/GM vaginal cream  Apply pea-sized amount over urethral opening daily. 06/26/22  Yes Breeback, Jade L, PA-C  fexofenadine (ALLEGRA) 180 MG tablet Take 180 mg by mouth daily as needed for allergies.   Yes [provider]  fluticasone (FLONASE) 50 MCG/ACT nasal spray Place 1 spray into both nostrils daily as needed for allergies.   Yes [provider]  gabapentin  (NEURONTIN ) 100 MG capsule Take 1-3 capsules (100-300 mg total) by mouth 3 (three) times daily, for pain. Patient not taking: Reported on 05/27/2023 07/04/22   Sandy Crumb L, PA-C  glucose blood (ACCU-CHEK GUIDE) test strip Use up to four times daily as directed 07/08/21     methocarbamol  (ROBAXIN ) 500 MG tablet Take 1 tablet (500 mg total) by mouth 4 (four) times daily. Patient taking differently: Take 500 mg by mouth every 6 (six) hours as needed for muscle spasms. 10/12/22  Yes Breeback, Jade L, PA-C  omeprazole  (PRILOSEC) 20 MG capsule Take 1 capsule (20 mg total) by mouth daily. Patient not taking: Reported on 05/27/2023 02/17/22   Araceli Knight, PA-C  mirabegron  ER (MYRBETRIQ ) 50 MG TB24 tablet 1 tablet PO QD 07/11/22 07/11/22      Physical Exam: Vitals:   05/27/23 1909 05/27/23 1945 05/27/23 2000 05/27/23 2011  BP: 125/80 (!) 161/87    Pulse: (!) 102 100 (!) 102   Resp:  18 19   Temp: 98 F (36.7 C)     SpO2: 100% 99% 91% 92%   Physical Exam Constitutional:      Appearance: She is normal weight.  HENT:     Head:  Normocephalic.     Mouth/Throat:     Mouth: Mucous membranes are moist.  Cardiovascular:     Rate and Rhythm: Normal rate and regular rhythm.     Heart sounds: Normal heart sounds.  Abdominal:     General: Abdomen is flat. Bowel sounds are normal.     Palpations: Abdomen is soft.     Tenderness: There is abdominal tenderness.  Skin:    General: Skin is warm.     Capillary Refill: Capillary refill takes less than 2 seconds.  Neurological:     General: No focal deficit present.     Mental Status: She is alert.      Labs on Admission: I have personally reviewed the patients's labs and imaging studies.  Assessment/Plan Principal Problem:   Bowel perforation (HCC)   # Obesity status post Roux-en-Y gastric bypass complicated by possible wound dehiscence - Imaging findings with concern for bowel perforation -Mild lactic acidosis and leukocytosis - Mildly tachycardic  Plan: Appreciate surgical recommendations N.p.o. Diagnostic laparoscopy per surgery Continue Zosyn  Continue IV fluids  # History of gout-continue allopurinol   # Hyperlipidemia-continue Lipitor  # Hypertension-continue losartan , spironolactone     Admission status: Inpatient Telemetry  Certification: The appropriate patient status for this patient is INPATIENT. Inpatient status is judged to be reasonable and necessary in order to provide the required intensity of service to ensure the patient's safety. The patient's presenting symptoms, physical exam findings, and initial radiographic and laboratory data in the context of their chronic comorbidities is felt to place them at high risk for further clinical deterioration. Furthermore, it is not anticipated that the patient will be medically stable for discharge from the hospital within 2 midnights of admission.   * I certify that at the point of admission it is my clinical judgment that the patient will require inpatient hospital care spanning beyond 2 midnights  from the point of admission  due to high intensity of service, high risk for further deterioration and high frequency of surveillance required.Myrl Askew MD [facility] If 7PM-7AM, please contact night-coverage [email or web address]  05/27/2023, 10:05 PM

## 2023-05-27 NOTE — ED Provider Notes (Signed)
 [place] EMERGENCY DEPARTMENT AT [facility] Provider Note   CSN: [id number] Arrival date & time: 05/27/23  1709     History  Chief Complaint  Patient presents with   Abdominal Pain    Amanda [name] is a 68 y.o. female.  HPI Patient reports she has had several days of loose stool.  She reports has been about the consistency of thin oatmeal slightly dark in appearance.  She did not think it looked bloody.  She was not having pain or fever and did not feel very badly.  She reports is not very unusual for her to have thin stool.  She experienced a very abrupt and unexpected pain at about 3 PM today.  She was watching television and felt comfortable when suddenly she got a sharp pain that radiated across her abdomen toward the left.  She reports it felt like a pole went through her when it happened.  She reports since then the pain has been uncomfortable but not as severe.  She reports it is worse with certain movements still. Prior surgery by Dr. Sueanne Emerald in [place] (partner Dr. Tresa Frohlich) gastric sleeve Roux-en-Y.    Home Medications Prior to Admission medications   Medication Sig Start Date End Date Taking? Authorizing Provider  Accu-Chek Softclix Lancets lancets Use up to four times daily as directed 07/08/21     acetaminophen  (TYLENOL ) 500 MG tablet Take 500 mg by mouth every 6 (six) hours as needed.    [provider]  alendronate  (FOSAMAX ) 70 MG tablet Take 1 tablet (70 mg total) by mouth every 7 (seven) days. 01/26/23   Breeback, Jade L, PA-C  allopurinol  (ZYLOPRIM ) 100 MG tablet Take 1 tablet (100 mg total) by mouth daily. Needs appt 02/14/23   Araceli Knight, PA-C  atorvastatin  (LIPITOR) 40 MG tablet Take 1 tablet (40 mg total) by mouth daily. 07/04/22   Breeback, Jade L, PA-C  blood glucose meter kit and supplies Use up to four times daily as directed 07/08/21   Breeback, Jade L, PA-C  calcium  citrate (CALCITRATE - DOSED IN MG ELEMENTAL CALCIUM ) 950 (200 Ca)  MG tablet     [provider]  Continuous Glucose Sensor (FREESTYLE LIBRE 14 DAY SENSOR) MISC Apply to upper deltoid every 14 days. Use reader to determine blood sugars 05/01/23   Breeback, Jade L, PA-C  dapagliflozin  propanediol (FARXIGA ) 10 MG TABS tablet Take 1 tablet (10 mg total) by mouth daily. 04/10/23   Breeback, Jade L, PA-C  estradiol  (ESTRACE  VAGINAL) 0.1 MG/GM vaginal cream Apply pea-sized amount over urethral opening daily. 06/26/22   Breeback, Jade L, PA-C  ferrous sulfate 324 MG TBEC Take 324 mg by mouth.    [provider]  fexofenadine (ALLEGRA) 180 MG tablet 180 mg by oral route.    [provider]  fluticasone (FLONASE) 50 MCG/ACT nasal spray     [provider]  gabapentin  (NEURONTIN ) 100 MG capsule Take 1-3 capsules (100-300 mg total) by mouth 3 (three) times daily, for pain. 07/04/22   Breeback, Jade L, PA-C  gabapentin  (NEURONTIN ) 300 MG capsule Take 1-2 capsules (300-600 mg total) by mouth 3 (three) times daily. 01/26/23   Breeback, Jade L, PA-C  glucose blood (ACCU-CHEK GUIDE) test strip Use up to four times daily as directed 07/08/21     ibuprofen (ADVIL) 800 MG tablet Take 800 mg by mouth every 8 (eight) hours as needed.    [provider]  losartan  (COZAAR ) 25 MG tablet Take 1  tablet (25 mg total) by mouth daily. 04/25/23   Breeback, Jade L, PA-C  metFORMIN  (GLUCOPHAGE ) 1000 MG tablet Take 1 tablet (1,000 mg total) by mouth 2 (two) times daily with a meal. 01/08/23   Breeback, Jade L, PA-C  methocarbamol  (ROBAXIN ) 500 MG tablet Take 1 tablet (500 mg total) by mouth 4 (four) times daily. 10/12/22   Breeback, Jade L, PA-C  Multiple Vitamin (MULTI-VITAMIN) tablet Take 1 tablet by mouth daily.    [provider]  nitrofurantoin  (MACRODANTIN ) 50 MG capsule Take 1 capsule by mouth daily. 05/26/22     omeprazole  (PRILOSEC) 20 MG capsule Take 1 capsule (20 mg total) by mouth daily. 02/17/22   Breeback, Jade L, PA-C  spironolactone   (ALDACTONE ) 25 MG tablet Take 1 tablet (25 mg total) by mouth daily. 01/26/23   Breeback, Jade L, PA-C  mirabegron  ER (MYRBETRIQ ) 50 MG TB24 tablet 1 tablet PO QD 07/11/22 07/11/22        Allergies    Fenofibrate, Morphine , Penicillins, Sulfonamide derivatives, Mounjaro  [tirzepatide ], and Ozempic  (0.25 or 0.5 mg-dose) [semaglutide (0.25 or 0.5mg -dos)]    Review of Systems   Review of Systems  Physical Exam Updated Vital Signs BP (!) 161/87   Pulse (!) 102   Temp 98 F (36.7 C)   Resp 19   SpO2 92%  Physical Exam Constitutional:      Comments: Alert nontoxic clear mental status.  No respiratory distress.  Comfortable appearance.  HENT:     Mouth/Throat:     Pharynx: Oropharynx is clear.  Eyes:     Extraocular Movements: Extraocular movements intact.  Cardiovascular:     Comments: Borderline tachycardia no significant rub or gallop. Pulmonary:     Effort: Pulmonary effort is normal.     Breath sounds: Normal breath sounds.  Abdominal:     Comments: Abdomen is soft without guarding.  Patient endorses moderate pain in the left mid and upper quadrant.  No guarding at this time.  Musculoskeletal:        General: No swelling or tenderness. Normal range of motion.     Right lower leg: No edema.     Left lower leg: No edema.  Skin:    General: Skin is warm and dry.  Neurological:     General: No focal deficit present.     Mental Status: She is oriented to person, place, and time.     Motor: No weakness.     Coordination: Coordination normal.  Psychiatric:        Mood and Affect: Mood normal.     ED Results / Procedures / Treatments   Labs (all labs ordered are listed, but only abnormal results are displayed) Labs Reviewed  COMPREHENSIVE METABOLIC PANEL WITH GFR - Abnormal; Notable for the following components:      Result Value   CO2 20 (*)    Glucose, Bld 176 (*)    All other components within normal limits  CBC - Abnormal; Notable for the following components:   WBC  12.3 (*)    MCV 103.2 (*)    All other components within normal limits  CULTURE, BLOOD (ROUTINE X 2)  CULTURE, BLOOD (ROUTINE X 2)  GASTROINTESTINAL PANEL BY PCR, STOOL (REPLACES STOOL CULTURE)  C DIFFICILE QUICK SCREEN W PCR REFLEX    LIPASE, BLOOD  URINALYSIS, ROUTINE W REFLEX MICROSCOPIC  POC OCCULT BLOOD, ED  I-STAT CG4 LACTIC ACID, ED  TROPONIN I (HIGH SENSITIVITY)  TROPONIN I (HIGH SENSITIVITY)    EKG EKG  Interpretation Date/Time:  Sunday May 27 2023 19:49:51 EDT Ventricular Rate:  100 PR Interval:  185 QRS Duration:  89 QT Interval:  349 QTC Calculation: 451 R Axis:   -75  Text Interpretation: Sinus tachycardia Left anterior fascicular block no acute ischemic appearance. no old comparison Confirmed by Wynetta Heckle [phone]) on 05/27/2023 8:19:09 PM  Radiology CT Angio Chest PE W and/or Wo Contrast Addendum Date: 05/27/2023 ADDENDUM REPORT: 05/27/2023 20:47 ADDENDUM: Addendum to include additional impression point. 7. Large amount of gas within the bladder. Correlate for recent instrumentation. Electronically Signed   By: Rozell Cornet M.D.   On: 05/27/2023 20:47   Result Date: 05/27/2023 CLINICAL DATA:  Mid abdominal pain radiating to the left shoulder. Diarrhea and weakness for 3-4 days. PE suspected. EXAM: CT ANGIOGRAPHY CHEST CT ABDOMEN AND PELVIS WITH CONTRAST TECHNIQUE: Multidetector CT imaging of the chest was performed using the standard protocol during bolus administration of intravenous contrast. Multiplanar CT image reconstructions and MIPs were obtained to evaluate the vascular anatomy. Multidetector CT imaging of the abdomen and pelvis was performed using the standard protocol during bolus administration of intravenous contrast. RADIATION DOSE REDUCTION: This exam was performed according to the departmental dose-optimization program which includes automated exposure control, adjustment of the mA and/or kV according to patient size and/or use of iterative  reconstruction technique. CONTRAST:  OMNIPAQUE  IOHEXOL  350 MG/ML SOLN COMPARISON:  Radiographs 09/15/2022; FINDINGS: CTA CHEST FINDINGS Cardiovascular: Normal heart size. No pericardial effusion. Coronary artery and aortic atherosclerotic calcification. Negative for acute pulmonary embolism. Mediastinum/Nodes: Trachea and esophagus are unremarkable. No thoracic adenopathy. Lungs/Pleura: Lungs are clear. No pleural effusion or pneumothorax. Musculoskeletal: No acute fracture. Review of the MIP images confirms the above findings. CT ABDOMEN and PELVIS FINDINGS Hepatobiliary: Hepatic steatosis. Cholecystectomy. No biliary dilation. Pancreas: Pancreatic atrophy. No ductal dilation. 1.1 cm cystic lesion in the pancreatic tail (series 4/image 25). Spleen: Unremarkable. Adrenals/Urinary Tract: Normal adrenal glands. No urinary calculi or hydronephrosis. Cortical calcification in the posterior left kidney. Large amount of gas within the bladder. Stomach/Bowel: Postoperative change of Roux-en-Y gastric bypass. Small amount of fluid and tiny locule of free intraperitoneal air extending from the gastro jejunal anastomosis (series 8/image 108). Mild wall thickening and mucosal hyperenhancement about the jejunum immediately distal to the gastrojejunal anastomosis. No bowel obstruction.  The appendix is not visualized. Vascular/Lymphatic: Aortic atherosclerosis. No enlarged abdominal or pelvic lymph nodes. Reproductive: Uterus and bilateral adnexa are unremarkable. Other: Free fluid and tiny locule of free intraperitoneal air about the gastro jejunal anastomosis in the left upper quadrant. No abscess. Musculoskeletal: No acute fracture. Review of the MIP images confirms the above findings. IMPRESSION: 1. Postoperative change of Roux-en-Y gastric bypass. Free fluid and tiny locule of intraperitoneal gas about the gastro jejunal anastomosis in the left upper quadrant. Findings are concerning for anastomotic dehiscence. 2.  Enteritis about the jejunum distal to the gastro jejunal anastomosis. 3. No pulmonary embolism or acute abnormality in the chest. 4. Hepatic steatosis. 5. 1.1 cm cystic lesion in the pancreatic tail. Most likely a benign IPMN or pseudocyst. Follow-up pancreatic protocol CT or MRI in 2 years is recommended. 6.  Aortic Atherosclerosis (ICD10-I70.0). Critical Value/emergent results were called by telephone at the time of interpretation on 05/27/2023 at 8:25 pm to provider Dr. Jennetta Modena, who verbally acknowledged these results. Electronically Signed: By: Rozell Cornet M.D. On: 05/27/2023 20:32   CT ABDOMEN PELVIS W CONTRAST Addendum Date: 05/27/2023 ADDENDUM REPORT: 05/27/2023 20:47 ADDENDUM: Addendum to include additional impression point. 7. Large  amount of gas within the bladder. Correlate for recent instrumentation. Electronically Signed   By: Rozell Cornet M.D.   On: 05/27/2023 20:47   Result Date: 05/27/2023 CLINICAL DATA:  Mid abdominal pain radiating to the left shoulder. Diarrhea and weakness for 3-4 days. PE suspected. EXAM: CT ANGIOGRAPHY CHEST CT ABDOMEN AND PELVIS WITH CONTRAST TECHNIQUE: Multidetector CT imaging of the chest was performed using the standard protocol during bolus administration of intravenous contrast. Multiplanar CT image reconstructions and MIPs were obtained to evaluate the vascular anatomy. Multidetector CT imaging of the abdomen and pelvis was performed using the standard protocol during bolus administration of intravenous contrast. RADIATION DOSE REDUCTION: This exam was performed according to the departmental dose-optimization program which includes automated exposure control, adjustment of the mA and/or kV according to patient size and/or use of iterative reconstruction technique. CONTRAST:  OMNIPAQUE  IOHEXOL  350 MG/ML SOLN COMPARISON:  Radiographs 09/15/2022; FINDINGS: CTA CHEST FINDINGS Cardiovascular: Normal heart size. No pericardial effusion. Coronary artery and  aortic atherosclerotic calcification. Negative for acute pulmonary embolism. Mediastinum/Nodes: Trachea and esophagus are unremarkable. No thoracic adenopathy. Lungs/Pleura: Lungs are clear. No pleural effusion or pneumothorax. Musculoskeletal: No acute fracture. Review of the MIP images confirms the above findings. CT ABDOMEN and PELVIS FINDINGS Hepatobiliary: Hepatic steatosis. Cholecystectomy. No biliary dilation. Pancreas: Pancreatic atrophy. No ductal dilation. 1.1 cm cystic lesion in the pancreatic tail (series 4/image 25). Spleen: Unremarkable. Adrenals/Urinary Tract: Normal adrenal glands. No urinary calculi or hydronephrosis. Cortical calcification in the posterior left kidney. Large amount of gas within the bladder. Stomach/Bowel: Postoperative change of Roux-en-Y gastric bypass. Small amount of fluid and tiny locule of free intraperitoneal air extending from the gastro jejunal anastomosis (series 8/image 108). Mild wall thickening and mucosal hyperenhancement about the jejunum immediately distal to the gastrojejunal anastomosis. No bowel obstruction.  The appendix is not visualized. Vascular/Lymphatic: Aortic atherosclerosis. No enlarged abdominal or pelvic lymph nodes. Reproductive: Uterus and bilateral adnexa are unremarkable. Other: Free fluid and tiny locule of free intraperitoneal air about the gastro jejunal anastomosis in the left upper quadrant. No abscess. Musculoskeletal: No acute fracture. Review of the MIP images confirms the above findings. IMPRESSION: 1. Postoperative change of Roux-en-Y gastric bypass. Free fluid and tiny locule of intraperitoneal gas about the gastro jejunal anastomosis in the left upper quadrant. Findings are concerning for anastomotic dehiscence. 2. Enteritis about the jejunum distal to the gastro jejunal anastomosis. 3. No pulmonary embolism or acute abnormality in the chest. 4. Hepatic steatosis. 5. 1.1 cm cystic lesion in the pancreatic tail. Most likely a benign IPMN  or pseudocyst. Follow-up pancreatic protocol CT or MRI in 2 years is recommended. 6.  Aortic Atherosclerosis (ICD10-I70.0). Critical Value/emergent results were called by telephone at the time of interpretation on 05/27/2023 at 8:25 pm to provider Dr. Jennetta Modena, who verbally acknowledged these results. Electronically Signed: By: Rozell Cornet M.D. On: 05/27/2023 20:32    Procedures Procedures   CRITICAL CARE Performed by: Wynetta Heckle   Total critical care time: 30 minutes  Critical care time was exclusive of separately billable procedures and treating other patients.  Critical care was necessary to treat or prevent imminent or life-threatening deterioration.  Critical care was time spent personally by me on the following activities: development of treatment plan with patient and/or surrogate as well as nursing, discussions with consultants, evaluation of patient's response to treatment, examination of patient, obtaining history from patient or surrogate, ordering and performing treatments and interventions, ordering and review of laboratory studies, ordering and review of  radiographic studies, pulse oximetry and re-evaluation of patient's condition.  Medications Ordered in ED Medications  oxyCODONE -acetaminophen  (PERCOCET/ROXICET) 5-325 MG per tablet 1 tablet (1 tablet Oral Given 05/27/23 1907)  piperacillin -tazobactam (ZOSYN ) IVPB 3.375 g (3.375 g Intravenous New Bag/Given 05/27/23 2048)  lactated ringers  bolus 1,000 mL (has no administration in time range)  iohexol  (OMNIPAQUE ) 350 MG/ML injection 100 mL (100 mLs Intravenous Contrast Given 05/27/23 1924)  HYDROmorphone  (DILAUDID ) injection 0.5 mg (0.5 mg Intravenous Given 05/27/23 2044)    ED Course/ Medical Decision Making/ A&P                                 Medical Decision Making Amount and/or Complexity of Data Reviewed Labs: ordered.  Risk Prescription drug management.  Presents as outlined.  She is initially assessed through  emergency medicine triage provider.  Labs and CT imaging ordered.  Troponin 4 metabolic panel normal except for glucose 176 normal LFTs and normal GFR greater than 60.  White count 12.3.  Hemoglobin 13.5.  Platelets 188.  Have personally reviewed CT imaging.  I subsequently got a call from radiology to notify that there is an area of small pocket of gas and fluid suspicious for a perforation at the anastomosis.  I personally reviewed the chest portion and do not appreciate a significant PE.  With evidence of perforation and history consistent, I have ordered IV antibiotics with Zosyn  and added lactic acid and cultures.  At this time patient is not showing immediate signs of sepsis.  She does have mild leukocytosis at 12,000 blood pressures are mildly hypertensive at 160s over 80s.  Patient is afebrile.  Will continue to observe closely with high risk of evolution to sepsis.  Patient treated for pain with Dilaudid  half milligram IV.  Consult: 20: 48 reviewed with Dr. Elvan Hamel general surgery.  Advises for medical admission and initiating antibiotics.  Dr. Elvan Hamel will evaluate the OR schedule and determine time of intervention.  At this time, n.p.o. with optimizing medical management.        Final Clinical Impression(s) / ED Diagnoses Final diagnoses:  Bowel perforation [facility])    Rx / DC Orders ED Discharge Orders     None         Wynetta Heckle, MD 05/27/23 2056

## 2023-05-28 ENCOUNTER — Other Ambulatory Visit: Payer: Self-pay

## 2023-05-28 ENCOUNTER — Encounter (HOSPITAL_COMMUNITY): Payer: Self-pay | Admitting: General Surgery

## 2023-05-28 DIAGNOSIS — K631 Perforation of intestine (nontraumatic): Secondary | ICD-10-CM | POA: Diagnosis not present

## 2023-05-28 LAB — GLUCOSE, CAPILLARY
Glucose-Capillary: 114 mg/dL — ABNORMAL HIGH (ref 70–99)
Glucose-Capillary: 120 mg/dL — ABNORMAL HIGH (ref 70–99)
Glucose-Capillary: 121 mg/dL — ABNORMAL HIGH (ref 70–99)
Glucose-Capillary: 153 mg/dL — ABNORMAL HIGH (ref 70–99)
Glucose-Capillary: 155 mg/dL — ABNORMAL HIGH (ref 70–99)
Glucose-Capillary: 167 mg/dL — ABNORMAL HIGH (ref 70–99)

## 2023-05-28 LAB — BASIC METABOLIC PANEL WITH GFR
Anion gap: 10 (ref 5–15)
BUN: 16 mg/dL (ref 8–23)
CO2: 19 mmol/L — ABNORMAL LOW (ref 22–32)
Calcium: 8.9 mg/dL (ref 8.9–10.3)
Chloride: 107 mmol/L (ref 98–111)
Creatinine, Ser: 0.93 mg/dL (ref 0.44–1.00)
GFR, Estimated: >60 mL/min (ref >60)
Glucose, Bld: 173 mg/dL — ABNORMAL HIGH (ref 70–99)
Potassium: 3.8 mmol/L (ref 3.5–5.1)
Sodium: 136 mmol/L (ref 135–145)

## 2023-05-28 LAB — CBC
HCT: 37 % (ref 36.0–46.0)
Hemoglobin: 11.5 g/dL — ABNORMAL LOW (ref 12.0–15.0)
MCH: 33.3 pg (ref 26.0–34.0)
MCHC: 31.1 g/dL (ref 30.0–36.0)
MCV: 107.2 fL — ABNORMAL HIGH (ref 80.0–100.0)
Platelets: 133 10*3/uL — ABNORMAL LOW (ref 150–400)
RBC: 3.45 MIL/uL — ABNORMAL LOW (ref 3.87–5.11)
RDW: 13.9 % (ref 11.5–15.5)
WBC: 20.1 10*3/uL — ABNORMAL HIGH (ref 4.0–10.5)
nRBC: 0 % (ref 0.0–0.2)

## 2023-05-28 MED ORDER — INSULIN ASPART 100 UNIT/ML IJ SOLN
0.0000 [IU] | INTRAMUSCULAR | Status: DC
  Administered 2023-05-28 (×2): 2 [IU] via SUBCUTANEOUS
  Administered 2023-05-28: 1 [IU] via SUBCUTANEOUS
  Administered 2023-06-01: 2 [IU] via SUBCUTANEOUS

## 2023-05-28 MED ORDER — ACETAMINOPHEN 10 MG/ML IV SOLN
INTRAVENOUS | Status: DC | PRN
  Administered 2023-05-28: 1000 mg via INTRAVENOUS

## 2023-05-28 MED ORDER — ONDANSETRON HCL 4 MG/2ML IJ SOLN
INTRAMUSCULAR | Status: DC | PRN
  Administered 2023-05-28: 4 mg via INTRAVENOUS

## 2023-05-28 MED ORDER — ENOXAPARIN SODIUM 40 MG/0.4ML IJ SOSY
40.0000 mg | PREFILLED_SYRINGE | INTRAMUSCULAR | Status: DC
  Administered 2023-05-28 – 2023-05-31 (×4): 40 mg via SUBCUTANEOUS
  Filled 2023-05-28 (×4): qty 0.4

## 2023-05-28 MED ORDER — LACTATED RINGERS IR SOLN
Status: DC | PRN
  Administered 2023-05-27: 1000 mL

## 2023-05-28 MED ORDER — PIPERACILLIN-TAZOBACTAM 3.375 G IVPB
3.3750 g | Freq: Three times a day (TID) | INTRAVENOUS | Status: AC
  Administered 2023-05-28 – 2023-05-30 (×9): 3.375 g via INTRAVENOUS
  Filled 2023-05-28 (×9): qty 50

## 2023-05-28 MED ORDER — SUCRALFATE 1 GM/10ML PO SUSP
1.0000 g | Freq: Four times a day (QID) | ORAL | Status: DC
  Administered 2023-05-28 – 2023-06-01 (×12): 1 g via ORAL
  Filled 2023-05-28 (×13): qty 10

## 2023-05-28 MED ORDER — MELATONIN 5 MG PO TABS
10.0000 mg | ORAL_TABLET | Freq: Every day | ORAL | Status: AC
  Administered 2023-05-28 – 2023-05-30 (×3): 10 mg via ORAL
  Filled 2023-05-28 (×3): qty 2

## 2023-05-28 MED ORDER — BUPIVACAINE-EPINEPHRINE 0.25% -1:200000 IJ SOLN
INTRAMUSCULAR | Status: DC | PRN
  Administered 2023-05-28: 6 mL

## 2023-05-28 MED ORDER — SUGAMMADEX SODIUM 200 MG/2ML IV SOLN
INTRAVENOUS | Status: DC | PRN
  Administered 2023-05-28: 200 mg via INTRAVENOUS

## 2023-05-28 MED ORDER — ADULT MULTIVITAMIN LIQUID CH
15.0000 mL | Freq: Every day | ORAL | Status: DC
  Administered 2023-05-28 – 2023-06-01 (×3): 15 mL via ORAL
  Filled 2023-05-28 (×5): qty 15

## 2023-05-28 MED ORDER — PANTOPRAZOLE SODIUM 40 MG IV SOLR
40.0000 mg | Freq: Two times a day (BID) | INTRAVENOUS | Status: DC
  Administered 2023-05-28 – 2023-06-01 (×10): 40 mg via INTRAVENOUS
  Filled 2023-05-28 (×10): qty 10

## 2023-05-28 NOTE — Op Note (Signed)
 05/27/2023 - 05/28/2023  1:00 AM  PATIENT:  [name]  68 y.o. female  PRE-OPERATIVE DIAGNOSIS:  Marginal ulcer perforation; h/o laparoscopic gastric bypass surgery at [facility] 2013, incisional hernia 2 cm  POST-OPERATIVE DIAGNOSIS:  same; incisional hernia 2cm;   PROCEDURE:  Procedure(s): LAPAROSCOPY DIAGNOSTIC, LAPAROSCOPIC GRAHAM PATCH REPAIR of perforated marginal ulcer, LAPARSCOPIC PRIMARY INCISIONAL HERNIA REPAIR 2cm  SURGEON:  Surgeon(s): Aldean Hummingbird, MD  ASSISTANTS: Circulator: Jaylene Metz, Anette Barb, RN Scrub Person: Little Riff, RNFA Circulator Assistant: Creasy, [place] H, RN    ANESTHESIA:   general  DRAINS: Urinary Catheter (Foley) and (67fr) Blake drain(s) in the LUQ over repair    LOCAL MEDICATIONS USED:  MARCAINE      SPECIMEN:  No Specimen  DISPOSITION OF SPECIMEN:  N/A  EBL: <25 mL  FINDINGS: The patient had about 1/2 cm perforation at the gastrojejunal anastomosis on the jejunal aspect.  It appears the patient has a mini gastric bypass-omega loop bypass with a enteroenterostomy about 20 cm distal to the gastrojejunal anastomosis.  The patient has a 250 to 260 cm biliopancreatic limb starting at the ligament of Treitz that comes up to the long sleeve gastric pouch where there is a (loop) gastrojejunal anastomosis.  About 20 to 30 cm distal to that anastomosis there appears to be an enteroenterostomy between alimentary limb to the BP limb and then it goes distal to the common channel.  The patient's gastric pouch is essentially a sleeve that comes down to the level of the transverse colon.  There is a 2 cm fat-containing ventral incisional hernia just above the umbilicus containing omentum.  I primarily repaired the perforated marginal ulcer with 3 interrupted 2-0 silk sutures and then used a tongue of omentum and tied that down over the repair. See pics/media tab in epic  COUNTS:  YES  INDICATION FOR PROCEDURE: This is a 68 year old female who has a history  of laparoscopic "gastric bypass "surgery at [facility] in 2013.  She has been compliant with her multivitamin supplements.  She has done well with her weight loss.  She states that she is had some nausea for the past few weeks intermittently along with an issue with loose stools for the past month or so.  She developed severe abdominal pain on Sunday.  She thought she had pulled a muscle.  She came to the emergency room and was found to have evidence of a perforated marginal ulcer on the jejunal aspect of her gastric bypass.  She had a leukocytosis and elevated lactate and was very tender on exam.  I recommended proceeding to the operating room for laparoscopic evaluation with probable laparoscopic Tyrone Gallop patch repair, possible gastrotomy tube placement in her remnant.  She had had a prior open cholecystectomy.  She had a fat-containing ventral incisional hernia that I thought we are going to be able to leave alone.  This measured about 2 cm.  Risk and benefits were separately documented and discussed with the patient  PROCEDURE: After obtaining informed consent the patient was taken urgently to operating room 1 at [facility].  She was placed upon on the operating room table.  General endotracheal anesthesia was established.  Sequential compression devices were placed.  A Foley catheter was placed.  Her abdomen was prepped and draped in usual standard surgical fashion with ChloraPrep.  She received IV antibiotic in the ER.  Surgical timeout was performed.  Due to her abdominal wall laxity I decided to gain access to the abdomen using an  open technique.  A small supraumbilical vertical incision was made.  Subcutaneous tissue was divided.  I visualized a fat-containing hernia just cephalad to this.  I made a small incision in the fascia and a pursestring suture of 0 Vicryl UR 6 placed on the fascial edges.  A 12 mm Hassan trocar was placed.  Pneumoperitoneum was smoothly established.  Laparoscope was advanced  of abdominal cavity was surveilled.  There was no evidence of injury to surrounding structures.  The patient had what appeared to be a fat-containing omentum ventral incisional hernia just above the umbilicus.  There was some omentum and right colon tethered to the right lateral abdominal wall in the right upper quadrant.  There was gross evidence of marginal ulcer perforation on what appeared to be the jejunal side of her gastrojejunal anastomosis of about half a centimeters.  There was evidence of thin purulent fluid in the left upper quadrant.  2 additional trocars were placed.  A 5 mm trocar in the right mid abdomen and a 5 mm trocar in the left mid abdominal wall all under direct visualization.  The patient was placed slightly in reverse and delivered.  Patient had a very long gastric pouch.  It was essentially a sleeve.  It extended down to the level of the transverse colon.  It appeared the patient had a loop gastrojejunostomy.  Distal to the loop gastrojejunostomy about 20 cm downstream there is an enteroenterostomy to the BP limb.  The alimentary limb then proceeds downstream with the common channel.  I ended up placing 3 interrupted 2-0 silk sutures to primarily repair the marginal ulcer.  It appeared that the edges of the perforation were well-approximated.  There was not a lot of mobile omentum available.  There was a tongue of omentum going up into the ventral incisional hernia.  I therefore decided to take down the ventral incisional hernia and this was done by taking EndoShears with and without electrocautery to reduce the plug of omentum from the 2 cm ventral incisional hernia.  I then created a tongue of omentum by dividing it slightly with EndoShears with electrocautery.  I placed 4 cc of Vistaseal  over the primary repair of the ulcer.  I had left to the sutures in the abdomen attached with tails.  I then brought the tongue of omentum up and laid it over the repair.  The omentum laid flat.  I then  tied down the omentum and secured it using the sutures as a Midwife.  I then ran the small bowel to further confirm the anatomy.  Starting at the cecum and terminal ileum and running back toward the proximal bowel.  Distal small bowel appeared normal.  As I ran the bowel back proximally I came back to the enteroenterostomy that was between the alimentary limb and the BP limb and the loop gastrojejunostomy.  I then started running the bowel proximally.  The transverse colon was lifted up in the left upper quadrant.  I found the ligament of Treitz and ran the bowel distally using atraumatic bowel graspers.  The BP limb was about 250 to 260 cm long and it came up as a loop/omega loop where there was the anastomosis to the gastric sleeve.  About 20 to 25 cm distal to this was what appeared to be an enteroenterostomy between the alimentary limb and the BP limb and then going downstream from the alimentary limb was the common channel.  A surgical drain was placed in the abdomen  over the [facility] and then brought out through the left abdominal wall through the previously placed 5 mm trocar.  It was secured to the skin with a 2-0 nylon.  Prior to doing this I did use a PMI suture passer to place 2-0 Vicryl's to primarily repair the ventral incisional hernia superior to the [facility] trocar.  It was airtight.  There is nothing trapped in it.  The assigned trocar was removed and 2-0 Vicryl's were placed at the umbilical fascia using a PMI suture passer.  Again it was airtight and nothing trapped within it.  The remaining trocar was removed.  Pneumoperitoneum was released.  The [facility] trocar skin site in the right abdominal wall trocar sites were closed with 4-0 Monocryl followed by Dermabond.  A drain sponge was placed around the left abdominal wall drain.  All needle, instrument, and sponge counts were correct x 2.  There were no immediate complications.  Patient was extubated and taken the recovery room  in stable condition   CASE DATA: Type of patient?: LDOW CASE (Surgical Hospitalist [facility] Inpatient) Status of Case? URGENT Add On Infection Present At Time Of Surgery (PATOS)?  PURULENCE  Hernia Details   Size: 2cm and Recurrent Hernia   PLAN OF CARE: Admit to inpatient   PATIENT DISPOSITION:  PACU - hemodynamically stable.   Delay start of Pharmacological VTE agent (>24hrs) due to surgical blood loss or risk of bleeding:  no  Marianna Shirk. Elvan Hamel, MD, FACS General, Bariatric, & Minimally Invasive Surgery [facility], [place]

## 2023-05-28 NOTE — Transfer of Care (Signed)
 Immediate Anesthesia Transfer of Care Note  Patient: Amanda [name]  Procedure(s) Performed: LAPAROSCOPY DIAGNOSTIC, LAPAROSCOPIC GRAHAM PATCH REPAIR, LAPARSCOPIC PRIMARY INCISIONAL HERNIA REPAIR  Patient Location: PACU  Anesthesia Type:General  Level of Consciousness: drowsy and patient cooperative  Airway & Oxygen Therapy: Patient Spontanous Breathing  Post-op Assessment: Report given to RN and Post -op Vital signs reviewed and stable  Post vital signs: Reviewed and stable  Last Vitals:  Vitals Value Taken Time  BP 147/96 05/28/23 0101  Temp    Pulse 100 05/28/23 0105  Resp 17 05/28/23 0105  SpO2 94 % 05/28/23 0105  Vitals shown include unfiled device data.  Last Pain:  Vitals:   05/27/23 2041  PainSc: 10-Worst pain ever         Complications: No notable events documented.

## 2023-05-28 NOTE — Plan of Care (Signed)
  Problem: Clinical Measurements: Goal: Ability to maintain clinical measurements within normal limits will improve Outcome: Progressing Goal: Will remain free from infection Outcome: Progressing   Problem: Activity: Goal: Risk for activity intolerance will decrease Outcome: Not Progressing   Problem: Nutrition: Goal: Adequate nutrition will be maintained Outcome: Not Progressing   Problem: Coping: Goal: Level of anxiety will decrease Outcome: Progressing

## 2023-05-28 NOTE — Progress Notes (Signed)
   05/28/23 1102  TOC Brief Assessment  Insurance and Status Reviewed  Patient has primary care physician Yes  Home environment has been reviewed resides in private residence with spouse  Prior level of function: Independent  Prior/Current Home Services No current home services  Social Drivers of Health Review SDOH reviewed no interventions necessary  Readmission risk has been reviewed Yes  Transition of care needs no transition of care needs at this time

## 2023-05-28 NOTE — Anesthesia Postprocedure Evaluation (Signed)
 Anesthesia Post Note  Patient: Amanda [name]  Procedure(s) Performed: LAPAROSCOPY DIAGNOSTIC, LAPAROSCOPIC GRAHAM PATCH REPAIR, LAPARSCOPIC PRIMARY INCISIONAL HERNIA REPAIR     Patient location during evaluation: PACU Anesthesia Type: General Level of consciousness: awake and alert Pain management: pain level controlled Vital Signs Assessment: post-procedure vital signs reviewed and stable Respiratory status: spontaneous breathing, nonlabored ventilation and respiratory function stable Cardiovascular status: blood pressure returned to baseline Postop Assessment: no apparent nausea or vomiting Anesthetic complications: no   No notable events documented.             Rayfield Cairo

## 2023-05-28 NOTE — Progress Notes (Signed)
 PROGRESS NOTE  Amanda [name]  DOB: 24-Jan-1956  PCP: Araceli Knight, PA-C ZOX:[id number]  DOA: 05/27/2023  LOS: 1 day  Hospital Day: 2  Brief narrative: Amanda [name] is a 68 y.o. female with PMH significant for DM2, HTN, sleep apnea, arthritis, h/o Roux-en-Y bypass 2013. 4/20, patient presented to the ED with complaint of diarrhea, acute onset severe abdominal pain.  Labs showed WBC count of 12.3, lactic acid 2.3 Urinalysis suggestive of infection CT abdomen pelvis showed large amount of gas within the bladder with intraperitoneal gas concerning for anastomotic dehiscence.  There was also a 1.1 cm pancreatic tail lesion.   CTPA negative for pulm embolism Admitted to [facility] General Surgery was consulted 4/21, patient underwent laparoscopic Tyrone Gallop patch repair of perforated marginal ulcer, laparoscopic primary incisional hernia repair  Subjective: Patient was seen and examined this morning.  Pleasant elderly Caucasian female.  Not in distress.  Patient is concerned that she was disoriented this morning.  She knows she has Alzheimer's dementia but was never disoriented like this before. Chart reviewed In the last 24 hours, afebrile, hemodynamically stable, on 3 L oxygen Labs this morning with WBC count 20.1 Start sliding scale with Accu-Cheks  Assessment and plan: Anastomotic ulcer perforation H/o Roux-en-Y gastric bypass 2013 Presented with acute onset severe abdominal pain CT abdomen suggestive of intraperitoneal gas concerning for anastomotic dehiscence Underwent laparoscopic Tyrone Gallop patch repair of perforated marginal ulcer by Dr. Elvan Hamel Currently in postop ileus Continue IV antibiotics Repeat WBC count and lactic acid level tomorrow Recent Labs  Lab 05/27/23 1805 05/27/23 2117 05/28/23 0439  WBC 12.3*  --  20.1*  LATICACIDVEN  --  2.3*  --    Acute delirium Alzheimer's dementia Patient reports confusion, disorientation since this morning.  She has underlying  advanced dementia.  Delirium likely secondary to hospitalization, surgery, pain meds Reorientable. Continue to monitor mental status  Hypertension Continued on losartan  and Aldactone  IV hydralazine as needed  Type 2 diabetes mellitus A1c 5.8 on 04/03/2023 PTA meds-metformin , Farxiga  Currently on SSI/Accu-Cheks Recent Labs  Lab 05/28/23 0119 05/28/23 0924 05/28/23 1153  GLUCAP 167* 155* 153*   H/o gout Continue allopurinol   HLD Continue Lipitor   Mobility: Encourage ambulation  Goals of care   Code Status: Full Code     DVT prophylaxis:  enoxaparin  (LOVENOX ) injection 40 mg Start: 05/28/23 2215 SCD's Start: 05/28/23 0145 SCDs Start: 05/27/23 2202   Antimicrobials: IV Zosyn  Fluid: LR at 125 mL/h Consultants: Surgery Family Communication: None at bedside  Status: Inpatient Level of care:  Telemetry   Patient is from: Home Needs to continue in-hospital care: POD 1, has postop ileus Anticipated d/c to: Pending clinical course   Diet:  Diet Order             Diet NPO time specified Except for: Ice Chips, Sips with Meds  Diet effective now                   Scheduled Meds:  allopurinol   100 mg Oral Daily   enoxaparin  (LOVENOX ) injection  40 mg Subcutaneous Q24H   insulin  aspart  0-9 Units Subcutaneous Q4H   losartan   25 mg Oral Daily   melatonin  10 mg Oral QHS   multivitamin  15 mL Oral Daily   pantoprazole  (PROTONIX ) IV  40 mg Intravenous Q12H   spironolactone   25 mg Oral Daily   sucralfate   1 g Oral Q6H    PRN meds: ondansetron  **OR** ondansetron  (ZOFRAN ) IV  Infusions:   lactated ringers  125 mL/hr at 05/28/23 1206   piperacillin -tazobactam (ZOSYN )  IV 3.375 g (05/28/23 1217)    Antimicrobials: Anti-infectives (From admission, onward)    Start     Dose/Rate Route Frequency Ordered Stop   05/28/23 0400  piperacillin -tazobactam (ZOSYN ) IVPB 3.375 g  Status:  Discontinued        3.375 g 12.5 mL/hr over 240 Minutes Intravenous Every 8  hours 05/27/23 2220 05/28/23 0144   05/28/23 0400  piperacillin -tazobactam (ZOSYN ) IVPB 3.375 g        3.375 g 12.5 mL/hr over 240 Minutes Intravenous Every 8 hours 05/28/23 0144 05/31/23 0359   05/27/23 2045  piperacillin -tazobactam (ZOSYN ) IVPB 3.375 g        3.375 g 100 mL/hr over 30 Minutes Intravenous  Once 05/27/23 2036 05/27/23 2134       Objective: Vitals:   05/28/23 0930 05/28/23 1415  BP: (!) 143/79 (!) 155/70  Pulse: 78 82  Resp:    Temp: (!) 97.4 F (36.3 C)   SpO2: 96% 95%    Intake/Output Summary (Last 24 hours) at 05/28/2023 1547 Last data filed at 05/28/2023 1400 Gross per 24 hour  Intake 2085.58 ml  Output 770 ml  Net 1315.58 ml   Filed Weights   05/27/23 2200  Weight: 81.8 kg   Weight change:  Body mass index is 28.24 kg/m.   Physical Exam: General exam: Pleasant, elderly Caucasian female Skin: No rashes, lesions or ulcers. HEENT: Atraumatic, normocephalic, no obvious bleeding Lungs: Clear to auscultation bilaterally,  CVS: S1, S2, no murmur,   GI/Abd: Soft, appropriate postop tenderness, nondistended, bowel sound absent,   CNS: Alert, awake, oriented x 3.  Slow to respond Psychiatry: Anxious Extremities: No pedal edema, no calf tenderness,   Data Review: I have personally reviewed the laboratory data and studies available.  F/u labs  Unresulted Labs (From admission, onward)     Start     Ordered   05/29/23 0500  Basic metabolic panel with GFR  Tomorrow morning,   R        05/28/23 1543   05/29/23 0500  CBC with Differential/Platelet  Tomorrow morning,   R        05/28/23 1543   05/29/23 0500  Lactic acid, plasma  (Lactic Acid)  Tomorrow morning,   R        05/28/23 1543   05/27/23 2035  Gastrointestinal Panel by PCR , Stool  (Gastrointestinal Panel by PCR, Stool                                                                                                                                                     **Does Not include CLOSTRIDIUM  DIFFICILE testing. **If CDIFF testing is needed, place order from the "C Difficile Testing" order set.**)  Once,   URGENT  05/27/23 2036   05/27/23 2035  C Difficile Quick Screen w PCR reflex  (C Difficile quick screen w PCR reflex panel )  Once, for 24 hours,   URGENT       References:    CDiff Information Tool   05/27/23 2036           Total time spent in review of labs and imaging, patient evaluation, formulation of plan, documentation and communication with family: 55 minutes  Signed, Hoyt Macleod, MD [facility] 05/28/2023

## 2023-05-29 DIAGNOSIS — K631 Perforation of intestine (nontraumatic): Secondary | ICD-10-CM | POA: Diagnosis not present

## 2023-05-29 LAB — BASIC METABOLIC PANEL WITH GFR
Anion gap: 6 (ref 5–15)
BUN: 17 mg/dL (ref 8–23)
CO2: 25 mmol/L (ref 22–32)
Calcium: 8.5 mg/dL — ABNORMAL LOW (ref 8.9–10.3)
Chloride: 106 mmol/L (ref 98–111)
Creatinine, Ser: 0.98 mg/dL (ref 0.44–1.00)
GFR, Estimated: >60 mL/min (ref >60)
Glucose, Bld: 110 mg/dL — ABNORMAL HIGH (ref 70–99)
Potassium: 3.5 mmol/L (ref 3.5–5.1)
Sodium: 137 mmol/L (ref 135–145)

## 2023-05-29 LAB — CBC WITH DIFFERENTIAL/PLATELET
Abs Immature Granulocytes: 0.08 10*3/uL — ABNORMAL HIGH (ref 0.00–0.07)
Basophils Absolute: 0 10*3/uL (ref 0.0–0.1)
Basophils Relative: 0 %
Eosinophils Absolute: 0 10*3/uL (ref 0.0–0.5)
Eosinophils Relative: 0 %
HCT: 31.9 % — ABNORMAL LOW (ref 36.0–46.0)
Hemoglobin: 10.5 g/dL — ABNORMAL LOW (ref 12.0–15.0)
Immature Granulocytes: 1 %
Lymphocytes Relative: 19 %
Lymphs Abs: 2.9 10*3/uL (ref 0.7–4.0)
MCH: 33.8 pg (ref 26.0–34.0)
MCHC: 32.9 g/dL (ref 30.0–36.0)
MCV: 102.6 fL — ABNORMAL HIGH (ref 80.0–100.0)
Monocytes Absolute: 1 10*3/uL (ref 0.1–1.0)
Monocytes Relative: 7 %
Neutro Abs: 11.4 10*3/uL — ABNORMAL HIGH (ref 1.7–7.7)
Neutrophils Relative %: 73 %
Platelets: 124 10*3/uL — ABNORMAL LOW (ref 150–400)
RBC: 3.11 MIL/uL — ABNORMAL LOW (ref 3.87–5.11)
RDW: 14 % (ref 11.5–15.5)
WBC: 15.4 10*3/uL — ABNORMAL HIGH (ref 4.0–10.5)
nRBC: 0 % (ref 0.0–0.2)

## 2023-05-29 LAB — GLUCOSE, CAPILLARY
Glucose-Capillary: 109 mg/dL — ABNORMAL HIGH (ref 70–99)
Glucose-Capillary: 97 mg/dL (ref 70–99)
Glucose-Capillary: 100 mg/dL — ABNORMAL HIGH (ref 70–99)
Glucose-Capillary: 99 mg/dL (ref 70–99)
Glucose-Capillary: 93 mg/dL (ref 70–99)

## 2023-05-29 LAB — LACTIC ACID, PLASMA: Lactic Acid, Venous: 0.7 mmol/L (ref 0.5–1.9)

## 2023-05-29 MED ORDER — MORPHINE SULFATE (PF) 2 MG/ML IV SOLN
1.0000 mg | INTRAVENOUS | Status: DC | PRN
  Administered 2023-05-29: 1 mg via INTRAVENOUS
  Filled 2023-05-29: qty 1

## 2023-05-29 MED ORDER — ACETAMINOPHEN 325 MG PO TABS
650.0000 mg | ORAL_TABLET | Freq: Four times a day (QID) | ORAL | Status: AC | PRN
  Administered 2023-05-29 – 2023-05-30 (×2): 650 mg via ORAL
  Filled 2023-05-29 (×2): qty 2

## 2023-05-29 NOTE — Progress Notes (Signed)
 Progress Note  2 Days Post-Op  Subjective: Pt less disoriented this AM compared to yesterday. Denies nausea or vomiting. She was taking some ice chips, I recommended strict NPO until UGI. She is passing some flatus. Ambulating in hallways. Abdominal pain well controlled.   Objective: Vital signs in last 24 hours: Temp:  [97.9 F (36.6 C)-98.5 F (36.9 C)] 97.9 F (36.6 C) (04/22 0545) Pulse Rate:  [70-82] 70 (04/22 0545) Resp:  [16-19] 19 (04/22 0545) BP: (133-157)/(70-83) 133/71 (04/22 0545) SpO2:  [92 %-97 %] 95 % (04/22 0545) Last BM Date : 05/26/23  Intake/Output from previous day: 04/21 0701 - 04/22 0700 In: 2658.8 [I.V.:2477.7; IV Piggyback:181.2] Out: 2575 [Urine:1525; Drains:1050] Intake/Output this shift: Total I/O In: 0  Out: 300 [Urine:300]  PE: General: pleasant, WD, overweight female who is laying in bed in NAD HEENT: sclera anicteric  Lungs: Respiratory effort nonlabored Abd: soft, NT, ND, drain SS, incisions C/D/I    Lab Results:  Recent Labs    05/28/23 0439 05/29/23 0500  WBC 20.1* 15.4*  HGB 11.5* 10.5*  HCT 37.0 31.9*  PLT 133* 124*   BMET Recent Labs    05/28/23 0439 05/29/23 0500  NA 136 137  K 3.8 3.5  CL 107 106  CO2 19* 25  GLUCOSE 173* 110*  BUN 16 17  CREATININE 0.93 0.98  CALCIUM  8.9 8.5*   PT/INR No results for input(s): "LABPROT", "INR" in the last 72 hours. CMP     Component Value Date/Time   NA 137 05/29/2023 0500   NA 148 (H) 04/03/2023 1041   K 3.5 05/29/2023 0500   CL 106 05/29/2023 0500   CO2 25 05/29/2023 0500   GLUCOSE 110 (H) 05/29/2023 0500   BUN 17 05/29/2023 0500   BUN 12 04/03/2023 1041   CREATININE 0.98 05/29/2023 0500   CREATININE 0.93 02/13/2022 1032   CALCIUM  8.5 (L) 05/29/2023 0500   PROT 6.5 05/27/2023 1805   PROT 6.5 04/03/2023 1041   ALBUMIN 3.7 05/27/2023 1805   ALBUMIN 4.1 04/03/2023 1041   AST 25 05/27/2023 1805   ALT 16 05/27/2023 1805   ALKPHOS 46 05/27/2023 1805   BILITOT 1.1  05/27/2023 1805   BILITOT 0.5 04/03/2023 1041   GFRNONAA >60 05/29/2023 0500   GFRNONAA 61 02/12/2020 1031   GFRAA 71 02/12/2020 1031   Lipase     Component Value Date/Time   LIPASE 27 05/27/2023 1805       Studies/Results: CT Angio Chest PE W and/or Wo Contrast Addendum Date: 05/27/2023 ADDENDUM REPORT: 05/27/2023 20:47 ADDENDUM: Addendum to include additional impression point. 7. Large amount of gas within the bladder. Correlate for recent instrumentation. Electronically Signed   By: Rozell Cornet M.D.   On: 05/27/2023 20:47   Result Date: 05/27/2023 CLINICAL DATA:  Mid abdominal pain radiating to the left shoulder. Diarrhea and weakness for 3-4 days. PE suspected. EXAM: CT ANGIOGRAPHY CHEST CT ABDOMEN AND PELVIS WITH CONTRAST TECHNIQUE: Multidetector CT imaging of the chest was performed using the standard protocol during bolus administration of intravenous contrast. Multiplanar CT image reconstructions and MIPs were obtained to evaluate the vascular anatomy. Multidetector CT imaging of the abdomen and pelvis was performed using the standard protocol during bolus administration of intravenous contrast. RADIATION DOSE REDUCTION: This exam was performed according to the departmental dose-optimization program which includes automated exposure control, adjustment of the mA and/or kV according to patient size and/or use of iterative reconstruction technique. CONTRAST:  OMNIPAQUE  IOHEXOL  350 MG/ML SOLN COMPARISON:  Radiographs 09/15/2022; FINDINGS: CTA CHEST FINDINGS Cardiovascular: Normal heart size. No pericardial effusion. Coronary artery and aortic atherosclerotic calcification. Negative for acute pulmonary embolism. Mediastinum/Nodes: Trachea and esophagus are unremarkable. No thoracic adenopathy. Lungs/Pleura: Lungs are clear. No pleural effusion or pneumothorax. Musculoskeletal: No acute fracture. Review of the MIP images confirms the above findings. CT ABDOMEN and PELVIS FINDINGS  Hepatobiliary: Hepatic steatosis. Cholecystectomy. No biliary dilation. Pancreas: Pancreatic atrophy. No ductal dilation. 1.1 cm cystic lesion in the pancreatic tail (series 4/image 25). Spleen: Unremarkable. Adrenals/Urinary Tract: Normal adrenal glands. No urinary calculi or hydronephrosis. Cortical calcification in the posterior left kidney. Large amount of gas within the bladder. Stomach/Bowel: Postoperative change of Roux-en-Y gastric bypass. Small amount of fluid and tiny locule of free intraperitoneal air extending from the gastro jejunal anastomosis (series 8/image 108). Mild wall thickening and mucosal hyperenhancement about the jejunum immediately distal to the gastrojejunal anastomosis. No bowel obstruction.  The appendix is not visualized. Vascular/Lymphatic: Aortic atherosclerosis. No enlarged abdominal or pelvic lymph nodes. Reproductive: Uterus and bilateral adnexa are unremarkable. Other: Free fluid and tiny locule of free intraperitoneal air about the gastro jejunal anastomosis in the left upper quadrant. No abscess. Musculoskeletal: No acute fracture. Review of the MIP images confirms the above findings. IMPRESSION: 1. Postoperative change of Roux-en-Y gastric bypass. Free fluid and tiny locule of intraperitoneal gas about the gastro jejunal anastomosis in the left upper quadrant. Findings are concerning for anastomotic dehiscence. 2. Enteritis about the jejunum distal to the gastro jejunal anastomosis. 3. No pulmonary embolism or acute abnormality in the chest. 4. Hepatic steatosis. 5. 1.1 cm cystic lesion in the pancreatic tail. Most likely a benign IPMN or pseudocyst. Follow-up pancreatic protocol CT or MRI in 2 years is recommended. 6.  Aortic Atherosclerosis (ICD10-I70.0). Critical Value/emergent results were called by telephone at the time of interpretation on 05/27/2023 at 8:25 pm to provider Dr. Jennetta Modena, who verbally acknowledged these results. Electronically Signed: By: Rozell Cornet M.D.  On: 05/27/2023 20:32   CT ABDOMEN PELVIS W CONTRAST Addendum Date: 05/27/2023 ADDENDUM REPORT: 05/27/2023 20:47 ADDENDUM: Addendum to include additional impression point. 7. Large amount of gas within the bladder. Correlate for recent instrumentation. Electronically Signed   By: Rozell Cornet M.D.   On: 05/27/2023 20:47   Result Date: 05/27/2023 CLINICAL DATA:  Mid abdominal pain radiating to the left shoulder. Diarrhea and weakness for 3-4 days. PE suspected. EXAM: CT ANGIOGRAPHY CHEST CT ABDOMEN AND PELVIS WITH CONTRAST TECHNIQUE: Multidetector CT imaging of the chest was performed using the standard protocol during bolus administration of intravenous contrast. Multiplanar CT image reconstructions and MIPs were obtained to evaluate the vascular anatomy. Multidetector CT imaging of the abdomen and pelvis was performed using the standard protocol during bolus administration of intravenous contrast. RADIATION DOSE REDUCTION: This exam was performed according to the departmental dose-optimization program which includes automated exposure control, adjustment of the mA and/or kV according to patient size and/or use of iterative reconstruction technique. CONTRAST:  OMNIPAQUE  IOHEXOL  350 MG/ML SOLN COMPARISON:  Radiographs 09/15/2022; FINDINGS: CTA CHEST FINDINGS Cardiovascular: Normal heart size. No pericardial effusion. Coronary artery and aortic atherosclerotic calcification. Negative for acute pulmonary embolism. Mediastinum/Nodes: Trachea and esophagus are unremarkable. No thoracic adenopathy. Lungs/Pleura: Lungs are clear. No pleural effusion or pneumothorax. Musculoskeletal: No acute fracture. Review of the MIP images confirms the above findings. CT ABDOMEN and PELVIS FINDINGS Hepatobiliary: Hepatic steatosis. Cholecystectomy. No biliary dilation. Pancreas: Pancreatic atrophy. No ductal dilation. 1.1 cm cystic lesion in the pancreatic tail (series 4/image 25).  Spleen: Unremarkable. Adrenals/Urinary  Tract: Normal adrenal glands. No urinary calculi or hydronephrosis. Cortical calcification in the posterior left kidney. Large amount of gas within the bladder. Stomach/Bowel: Postoperative change of Roux-en-Y gastric bypass. Small amount of fluid and tiny locule of free intraperitoneal air extending from the gastro jejunal anastomosis (series 8/image 108). Mild wall thickening and mucosal hyperenhancement about the jejunum immediately distal to the gastrojejunal anastomosis. No bowel obstruction.  The appendix is not visualized. Vascular/Lymphatic: Aortic atherosclerosis. No enlarged abdominal or pelvic lymph nodes. Reproductive: Uterus and bilateral adnexa are unremarkable. Other: Free fluid and tiny locule of free intraperitoneal air about the gastro jejunal anastomosis in the left upper quadrant. No abscess. Musculoskeletal: No acute fracture. Review of the MIP images confirms the above findings. IMPRESSION: 1. Postoperative change of Roux-en-Y gastric bypass. Free fluid and tiny locule of intraperitoneal gas about the gastro jejunal anastomosis in the left upper quadrant. Findings are concerning for anastomotic dehiscence. 2. Enteritis about the jejunum distal to the gastro jejunal anastomosis. 3. No pulmonary embolism or acute abnormality in the chest. 4. Hepatic steatosis. 5. 1.1 cm cystic lesion in the pancreatic tail. Most likely a benign IPMN or pseudocyst. Follow-up pancreatic protocol CT or MRI in 2 years is recommended. 6.  Aortic Atherosclerosis (ICD10-I70.0). Critical Value/emergent results were called by telephone at the time of interpretation on 05/27/2023 at 8:25 pm to provider Dr. Jennetta Modena, who verbally acknowledged these results. Electronically Signed: By: Rozell Cornet M.D. On: 05/27/2023 20:32    Anti-infectives: Anti-infectives (From admission, onward)    Start     Dose/Rate Route Frequency Ordered Stop   05/28/23 0400  piperacillin -tazobactam (ZOSYN ) IVPB 3.375 g  Status:  Discontinued         3.375 g 12.5 mL/hr over 240 Minutes Intravenous Every 8 hours 05/27/23 2220 05/28/23 0144   05/28/23 0400  piperacillin -tazobactam (ZOSYN ) IVPB 3.375 g        3.375 g 12.5 mL/hr over 240 Minutes Intravenous Every 8 hours 05/28/23 0144 05/31/23 0359   05/27/23 2045  piperacillin -tazobactam (ZOSYN ) IVPB 3.375 g        3.375 g 100 mL/hr over 30 Minutes Intravenous  Once 05/27/23 2036 05/27/23 2134        Assessment/Plan  POD1 s/p laparoscopic graham patch repair of perforated marginal ulcer, primary incisional hernia repair  - having some flatus already but too early to study with UGI - will plan 4/24 - drain SS and not high OP - continue NPO and IV protonix  and abx - send stool for H.pylori  - continue to mobilize  FEN: NPO IVF@125cc /h BJY:NWGN ID: [email or web address]  4/20>>  - per [facility] -  T2DM HTN OSA arthritis UTI  LOS: 2 days     Annetta Killian, [facility] 05/29/2023, 10:23 AM Please see Amion for pager number during day hours 7:00am-4:30pm

## 2023-05-29 NOTE — Progress Notes (Signed)
 PROGRESS NOTE  Amanda [name]  DOB: 1955/08/04  PCP: Araceli Knight, PA-C VWU:[id number]  DOA: 05/27/2023  LOS: 2 days  Hospital Day: 3  Brief narrative: Amanda [name] is a 68 y.o. female with PMH significant for DM2, HTN, sleep apnea, arthritis, h/o Roux-en-Y bypass 2013. 4/20, patient presented to the ED with complaint of diarrhea, acute onset severe abdominal pain.  Labs showed WBC count of 12.3, lactic acid 2.3 Urinalysis suggestive of infection CT abdomen pelvis showed large amount of gas within the bladder with intraperitoneal gas concerning for anastomotic dehiscence.  There was also a 1.1 cm pancreatic tail lesion.   CTPA negative for pulm embolism Admitted to [facility] General Surgery was consulted 4/21, patient underwent laparoscopic Tyrone Gallop patch repair of perforated marginal ulcer, laparoscopic primary incisional hernia repair  Subjective: Patient was seen and examined this morning. Lying down in bed.  Not in distress. Passing flatus.  Ambulating in hallway Family at bedside. Afebrile, hemodynamically stable, breathing on room air Labs this morning with WBC count better at 15.4  Assessment and plan: Anastomotic ulcer perforation H/o Roux-en-Y gastric bypass 2013 Presented with acute onset severe abdominal pain CT abdomen suggestive of intraperitoneal gas concerning for anastomotic dehiscence Underwent laparoscopic Graham patch repair of perforated marginal ulcer by Dr. Elvan Hamel Currently in postop ileus.  Gradually improving.  Passing flatus.  General surgery plans for upper GI series tomorrow. Continue IV antibiotics WBC count and lactic acid level improving today Recent Labs  Lab 05/27/23 1805 05/27/23 2117 05/28/23 0439 05/29/23 0500  WBC 12.3*  --  20.1* 15.4*  LATICACIDVEN  --  2.3*  --  0.7   UTI Urinalysis 4/20 showed cloudy yellow urine with moderate leukocytes, positive nitrite, rare bacteria Blood culture sent, it seems urine culture was not sent.   Added today. Currently on IV Zosyn   Acute delirium Alzheimer's dementia Reports intermittent confusion, disorientation.  She has underlying advanced dementia.  Delirium likely secondary to hospitalization, surgery, pain meds Reorientable. Continue to monitor mental status  Hypertension Continued on losartan  and Aldactone  IV hydralazine as needed  Type 2 diabetes mellitus A1c 5.8 on 04/03/2023 PTA meds-metformin , Farxiga  Currently on SSI/Accu-Cheks Recent Labs  Lab 05/28/23 1559 05/28/23 1921 05/28/23 2352 05/29/23 0306 05/29/23 0728  GLUCAP 120* 121* 114* 109* 97   H/o gout Continue allopurinol   HLD Continue Lipitor   Mobility: Encourage ambulation  Goals of care   Code Status: Full Code     DVT prophylaxis:  enoxaparin  (LOVENOX ) injection 40 mg Start: 05/28/23 2215 SCD's Start: 05/28/23 0145 SCDs Start: 05/27/23 2202   Antimicrobials: IV Zosyn  Fluid: Continue IV hydration Consultants: General Surgery Family Communication: None at bedside  Status: Inpatient Level of care:  Telemetry   Patient is from: Home Needs to continue in-hospital care: POD 2, has postop ileus Anticipated d/c to: Pending clinical course   Diet:  Diet Order             Diet NPO time specified Except for: Ice Chips, Sips with Meds  Diet effective now                   Scheduled Meds:  allopurinol   100 mg Oral Daily   enoxaparin  (LOVENOX ) injection  40 mg Subcutaneous Q24H   insulin  aspart  0-9 Units Subcutaneous Q4H   losartan   25 mg Oral Daily   melatonin  10 mg Oral QHS   multivitamin  15 mL Oral Daily   pantoprazole  (PROTONIX ) IV  40 mg Intravenous Q12H  spironolactone   25 mg Oral Daily   sucralfate   1 g Oral Q6H    PRN meds: acetaminophen , ondansetron  **OR** ondansetron  (ZOFRAN ) IV   Infusions:   lactated ringers  125 mL/hr at 05/29/23 4098   piperacillin -tazobactam (ZOSYN )  IV 3.375 g (05/29/23 0332)    Antimicrobials: Anti-infectives (From admission,  onward)    Start     Dose/Rate Route Frequency Ordered Stop   05/28/23 0400  piperacillin -tazobactam (ZOSYN ) IVPB 3.375 g  Status:  Discontinued        3.375 g 12.5 mL/hr over 240 Minutes Intravenous Every 8 hours 05/27/23 2220 05/28/23 0144   05/28/23 0400  piperacillin -tazobactam (ZOSYN ) IVPB 3.375 g        3.375 g 12.5 mL/hr over 240 Minutes Intravenous Every 8 hours 05/28/23 0144 05/31/23 0359   05/27/23 2045  piperacillin -tazobactam (ZOSYN ) IVPB 3.375 g        3.375 g 100 mL/hr over 30 Minutes Intravenous  Once 05/27/23 2036 05/27/23 2134       Objective: Vitals:   05/29/23 0238 05/29/23 0545  BP: (!) 147/83 133/71  Pulse: 72 70  Resp: 16 19  Temp: 98.4 F (36.9 C) 97.9 F (36.6 C)  SpO2: 92% 95%    Intake/Output Summary (Last 24 hours) at 05/29/2023 1191 Last data filed at 05/29/2023 0603 Gross per 24 hour  Intake 2658.81 ml  Output 2575 ml  Net 83.81 ml   Filed Weights   05/27/23 2200  Weight: 81.8 kg   Weight change:  Body mass index is 28.24 kg/m.   Physical Exam: General exam: Pleasant, elderly Caucasian female Skin: No rashes, lesions or ulcers. HEENT: Atraumatic, normocephalic, no obvious bleeding Lungs: Clear to auscultation bilaterally,  CVS: S1, S2, no murmur,   GI/Abd: Soft, appropriate postop tenderness, nondistended, bowel sound sluggish, JP drain with serous sanguinous discharge CNS: Alert, awake, oriented x 3.   Psychiatry: Mood appropriate Extremities: No pedal edema, no calf tenderness,   Data Review: I have personally reviewed the laboratory data and studies available.  F/u labs  Unresulted Labs (From admission, onward)     Start     Ordered   05/27/23 2035  Gastrointestinal Panel by PCR , Stool  (Gastrointestinal Panel by PCR, Stool                                                                                                                                                     **Does Not include CLOSTRIDIUM DIFFICILE testing. **If  CDIFF testing is needed, place order from the "C Difficile Testing" order set.**)  Once,   URGENT        05/27/23 2036   05/27/23 2035  C Difficile Quick Screen w PCR reflex  (C Difficile quick screen w PCR reflex panel )  Once, for 24 hours,   URGENT  References:    CDiff Information Tool   05/27/23 2036           Total time spent in review of labs and imaging, patient evaluation, formulation of plan, documentation and communication with family: 45 minutes  Signed, Hoyt Macleod, MD [facility] 05/29/2023

## 2023-05-29 NOTE — Progress Notes (Signed)
 Mobility Specialist - Progress Note   05/29/23 0933  Mobility  Activity Ambulated with assistance in hallway  Level of Assistance Modified independent, requires aide device or extra time  Assistive Device Front wheel walker  Distance Ambulated (ft) 1000 ft  Activity Response Tolerated well  Mobility Referral Yes  Mobility visit 1 Mobility  Mobility Specialist Start Time (ACUTE ONLY) 0913  Mobility Specialist Stop Time (ACUTE ONLY) 0933  Mobility Specialist Time Calculation (min) (ACUTE ONLY) 20 min   Pt received in bed and agreeable to mobility. No complaints during session. Upon returning, pt nose began to bleed. RN notified. Pt to EOB after session with all needs met.    [facility]

## 2023-05-30 ENCOUNTER — Ambulatory Visit: Admitting: Physician Assistant

## 2023-05-30 ENCOUNTER — Telehealth: Payer: Self-pay

## 2023-05-30 DIAGNOSIS — K631 Perforation of intestine (nontraumatic): Secondary | ICD-10-CM | POA: Diagnosis not present

## 2023-05-30 LAB — URINE CULTURE: Culture: NO GROWTH

## 2023-05-30 LAB — GLUCOSE, CAPILLARY
Glucose-Capillary: 101 mg/dL — ABNORMAL HIGH (ref 70–99)
Glucose-Capillary: 101 mg/dL — ABNORMAL HIGH (ref 70–99)
Glucose-Capillary: 104 mg/dL — ABNORMAL HIGH (ref 70–99)
Glucose-Capillary: 110 mg/dL — ABNORMAL HIGH (ref 70–99)
Glucose-Capillary: 118 mg/dL — ABNORMAL HIGH (ref 70–99)
Glucose-Capillary: 86 mg/dL (ref 70–99)

## 2023-05-30 MED ORDER — QUETIAPINE FUMARATE 25 MG PO TABS
25.0000 mg | ORAL_TABLET | Freq: Every day | ORAL | Status: DC
  Administered 2023-05-30 – 2023-05-31 (×2): 25 mg via ORAL
  Filled 2023-05-30 (×2): qty 1

## 2023-05-30 NOTE — Telephone Encounter (Signed)
 Dr. Rosevelt Constable called back -  States he apologizes for confusion but patient is currently still in hospital and being treated for UTI. ( Previously was seen for bowel perforation) Cancelled appt schld for today Called patient husband Amanda [name] and left a voice mail message requesting a return call. Patient will need a hospital f/u visit scheduled with Jade Breeback for next week.

## 2023-05-30 NOTE — Progress Notes (Addendum)
 Progress Note  3 Days Post-Op  Subjective: Had a rough night, to the point where she and her husband want to go home today AMA. Having visual hallucinations  Objective: Vital signs in last 24 hours: Temp:  [98.4 F (36.9 C)] 98.4 F (36.9 C) (04/22 2247) Pulse Rate:  [73-82] 82 (04/22 2247) Resp:  [16-18] 18 (04/22 2247) BP: (130-138)/(74-80) 138/74 (04/22 2247) SpO2:  [91 %-95 %] 91 % (04/22 2247) Last BM Date : 05/29/23  Intake/Output from previous day: 04/22 0701 - 04/23 0700 In: 1937.2 [I.V.:1808.7; IV Piggyback:128.5] Out: 951 [Urine:601; Drains:350] Intake/Output this shift: Total I/O In: -  Out: 30 [Drains:30]  PE: General: pleasant, WD, overweight female who is laying in bed in NAD HEENT: sclera anicteric  Lungs: Respiratory effort nonlabored Abd: soft, NT, ND, drain SS, incisions C/D/I    Lab Results:  Recent Labs    05/28/23 0439 05/29/23 0500  WBC 20.1* 15.4*  HGB 11.5* 10.5*  HCT 37.0 31.9*  PLT 133* 124*   BMET Recent Labs    05/28/23 0439 05/29/23 0500  NA 136 137  K 3.8 3.5  CL 107 106  CO2 19* 25  GLUCOSE 173* 110*  BUN 16 17  CREATININE 0.93 0.98  CALCIUM  8.9 8.5*   PT/INR No results for input(s): "LABPROT", "INR" in the last 72 hours. CMP     Component Value Date/Time   NA 137 05/29/2023 0500   NA 148 (H) 04/03/2023 1041   K 3.5 05/29/2023 0500   CL 106 05/29/2023 0500   CO2 25 05/29/2023 0500   GLUCOSE 110 (H) 05/29/2023 0500   BUN 17 05/29/2023 0500   BUN 12 04/03/2023 1041   CREATININE 0.98 05/29/2023 0500   CREATININE 0.93 02/13/2022 1032   CALCIUM  8.5 (L) 05/29/2023 0500   PROT 6.5 05/27/2023 1805   PROT 6.5 04/03/2023 1041   ALBUMIN 3.7 05/27/2023 1805   ALBUMIN 4.1 04/03/2023 1041   AST 25 05/27/2023 1805   ALT 16 05/27/2023 1805   ALKPHOS 46 05/27/2023 1805   BILITOT 1.1 05/27/2023 1805   BILITOT 0.5 04/03/2023 1041   GFRNONAA >60 05/29/2023 0500   GFRNONAA 61 02/12/2020 1031   GFRAA 71 02/12/2020 1031    Lipase     Component Value Date/Time   LIPASE 27 05/27/2023 1805       Studies/Results: No results found.   Anti-infectives: Anti-infectives (From admission, onward)    Start     Dose/Rate Route Frequency Ordered Stop   05/28/23 0400  piperacillin -tazobactam (ZOSYN ) IVPB 3.375 g  Status:  Discontinued        3.375 g 12.5 mL/hr over 240 Minutes Intravenous Every 8 hours 05/27/23 2220 05/28/23 0144   05/28/23 0400  piperacillin -tazobactam (ZOSYN ) IVPB 3.375 g        3.375 g 12.5 mL/hr over 240 Minutes Intravenous Every 8 hours 05/28/23 0144 05/31/23 0359   05/27/23 2045  piperacillin -tazobactam (ZOSYN ) IVPB 3.375 g        3.375 g 100 mL/hr over 30 Minutes Intravenous  Once 05/27/23 2036 05/27/23 2134        Assessment/Plan  POD1 s/p laparoscopic graham patch repair of perforated marginal ulcer, primary incisional hernia repair  - having some flatus already but too early to study with UGI - will plan 4/24 - drain SS and 350 mL yesterday - continue NPO and IV protonix  and abx - send stool for H.pylori  - continue to mobilize  It seems waxing and waning delirium causing frustrustration  and family considering leaving AMA.  I advised that is a terrible idea, ulcer appears to be healing but if it starts leaking again she could become very sick very quickly.  I would recommend she stay inpatient for an upper GI x-ray tomorrow and slow advancement of her diet.  It is possible we can discharge her as early as Friday.  Discussed with medical team in the room and will initiate delirium treatments which could help her tolerate staying inpatient longer.  This was all discussed with the patient and the husband at the bedside.  FEN: NPO IVF@125cc /h GNF:AOZH ID: [email or web address]  4/20>>  - per [facility] -  T2DM HTN OSA arthritis UTI  LOS: 3 days     Amanda Olds, MD [facility] 05/30/2023, 12:13 PM Please see Amion for pager number during day hours 7:00am-4:30pm

## 2023-05-30 NOTE — Progress Notes (Signed)
 Patient care was resumed by me. Patient was trying to leave home. She is in and out of confusion. She called [employer] and said she was being held against her will in the storage closet and she called her husband to come pick her up. AC, Christiane Cowing was notified and she came to patient's room to speak to her. Christiane Cowing, [facility] called husband and explained what was happening. Patient agreed to stay until MD Elvan Hamel comes this morning. Previous RN had notified NP Sharion Davidson of patient's confusion and wanting to leave.

## 2023-05-30 NOTE — Progress Notes (Addendum)
 Pt's bed alarm went off. Nurse and NT entered into pt's room.  Pt was in the process of trying ot get up and out of the bed by herself. She stated that she was going home and calling an UBER to come and get her. She stated she was going to pull her Ivs. Nurse asked her to leave Ivs in and that staying may benefit her due to the doctors plan not being discharging her right now. Pt is in and out of confusion in terms of the things she says. She doesn't remember being admitted for surgery that she had on Monday of this week, amongst other areas of forgetfulness/confusion. Nurse updated charge nurse=-Adriana, RN of this as well as J. Sharion Davidson, NP of this as well as the pt dialing 911 from her personal cell phone, in addition to her husband. She stated that "we" can't keep her  if she wants to go home. Lowell Rude, RN stated that [facility] come and speak with the pt, as Christiane Cowing has worked with the pt in the past, as a Engineer, civil (consulting) and was familiar with the pt. Christiane Cowing came up and talked with her. Pt shared with Christiane Cowing that she didn't get admitted. When, however, the pt was admitted due to her having had surgery on Monday of this week. Informed both the [facility] and the pt (pt was also shared this information at the beginning of nurse shift, in addition) that the doctor is wanting to do an upper GI on her, while she is here. She had the pt call her husband. Husband was en route up to the hospital to come and get pt. Christiane Cowing talked with the husband and the agreed resolve was that she would stay if we changed nurses. Nurses were changedLowell Rude, RN- charge nurse will be the primary nurse moving forward. Updated J. Sharion Davidson, NP of the resolve. No orders received.

## 2023-05-30 NOTE — Telephone Encounter (Signed)
 Currently has appt schld 07/06/23 - is this time frame acceptable or did you need to see patient sooner?

## 2023-05-30 NOTE — Progress Notes (Signed)
 PROGRESS NOTE  Amanda [name]  DOB: May 17, 1955  PCP: Araceli Knight, PA-C UEA:[id number]  DOA: 05/27/2023  LOS: 3 days  Hospital Day: 4  Brief narrative: Amanda [name] is a 68 y.o. female with PMH significant for DM2, HTN, sleep apnea, arthritis, h/o Roux-en-Y bypass 2013. 4/20, patient presented to the ED with complaint of diarrhea, acute onset severe abdominal pain.  Labs showed WBC count of 12.3, lactic acid 2.3 Urinalysis suggestive of infection CT abdomen pelvis showed large amount of gas within the bladder with intraperitoneal gas concerning for anastomotic dehiscence.  There was also a 1.1 cm pancreatic tail lesion.   CTPA negative for pulm embolism Admitted to [facility] General Surgery was consulted 4/21, patient underwent laparoscopic Tyrone Gallop patch repair of perforated marginal ulcer, laparoscopic primary incisional hernia repair  Subjective: Patient was seen and examined this morning. Lying on bed.  Was confused.  Husband at bedside. Events from last night noted.  Patient was restless agitated, trying to climb out of bed and hence nursing staff were involved to reorient her and for safety purpose.  Patient felt that she was being held against her wish.  Her husband was called twice last night.   This morning both are upset and wanted to leave AMA. At the time of my evaluation, general surgeon was also at bedside.  We both spent long time to counsel patient and husband to continue inpatient care.  She is at high risk of increased morbidity and mortality if leaves AMA. At the end of the conversation, both agreed to continue inpatient care.  Assessment and plan: Anastomotic ulcer perforation H/o Roux-en-Y gastric bypass 2013 Presented with acute onset severe abdominal pain CT abdomen suggestive of intraperitoneal gas concerning for anastomotic dehiscence Underwent laparoscopic Tyrone Gallop patch repair of perforated marginal ulcer by Dr. Elvan Hamel Currently has gradually improving  postop ileus. Passing flatus.  General surgery plans for upper GI series tomorrow. Continue IV antibiotics WBC count and lactic acid level trend as below.  Repeat labs tomorrow Recent Labs  Lab 05/27/23 1805 05/27/23 2117 05/28/23 0439 05/29/23 0500  WBC 12.3*  --  20.1* 15.4*  LATICACIDVEN  --  2.3*  --  0.7   UTI Urinalysis 4/20 showed cloudy yellow urine with moderate leukocytes, positive nitrite, rare bacteria Blood culture and urine culture have not shown any growth so far. Currently on IV Zosyn   Acute metabolic encephalopathy Alzheimer's dementia For the last 3 nights, patient has had increasing severity of nightly disorientation, restlessness or agitation.  She has underlying dementia and is currently going through hospital induced delirium.  Events from last night noted. Continue melatonin 10 mg nightly. For tonight, I will added Seroquel  25 mg nightly. Continue to monitor mental status change.  Hypertension Continued on losartan  and Aldactone  IV hydralazine as needed  Type 2 diabetes mellitus A1c 5.8 on 04/03/2023 PTA meds-metformin , Farxiga  Currently on SSI/Accu-Cheks Recent Labs  Lab 05/29/23 1547 05/29/23 2045 05/30/23 0022 05/30/23 0408 05/30/23 0743  GLUCAP 99 93 118* 110* 101*   H/o gout Continue allopurinol   HLD Continue Lipitor   Mobility: Encourage ambulation  Goals of care   Code Status: Full Code     DVT prophylaxis:  enoxaparin  (LOVENOX ) injection 40 mg Start: 05/28/23 2215 SCD's Start: 05/28/23 0145 SCDs Start: 05/27/23 2202   Antimicrobials: IV Zosyn  Fluid: Continue IV hydration while n.p.o. Consultants: General Surgery Family Communication: None at bedside  Status: Inpatient Level of care:  Telemetry   Patient is from: Home Needs to continue in-hospital  care: POD 3, has postop ileus resolving.  Not stable for discharge yet. Anticipated d/c to: Pending clinical course   Diet:  Diet Order             Diet NPO time  specified Except for: Sips with Meds  Diet effective now                   Scheduled Meds:  allopurinol   100 mg Oral Daily   enoxaparin  (LOVENOX ) injection  40 mg Subcutaneous Q24H   insulin  aspart  0-9 Units Subcutaneous Q4H   losartan   25 mg Oral Daily   melatonin  10 mg Oral QHS   multivitamin  15 mL Oral Daily   pantoprazole  (PROTONIX ) IV  40 mg Intravenous Q12H   spironolactone   25 mg Oral Daily   sucralfate   1 g Oral Q6H    PRN meds: acetaminophen , morphine  injection, ondansetron  **OR** ondansetron  (ZOFRAN ) IV   Infusions:   lactated ringers  125 mL/hr at 05/30/23 0753   piperacillin -tazobactam (ZOSYN )  IV 3.375 g (05/30/23 0410)    Antimicrobials: Anti-infectives (From admission, onward)    Start     Dose/Rate Route Frequency Ordered Stop   05/28/23 0400  piperacillin -tazobactam (ZOSYN ) IVPB 3.375 g  Status:  Discontinued        3.375 g 12.5 mL/hr over 240 Minutes Intravenous Every 8 hours 05/27/23 2220 05/28/23 0144   05/28/23 0400  piperacillin -tazobactam (ZOSYN ) IVPB 3.375 g        3.375 g 12.5 mL/hr over 240 Minutes Intravenous Every 8 hours 05/28/23 0144 05/31/23 0359   05/27/23 2045  piperacillin -tazobactam (ZOSYN ) IVPB 3.375 g        3.375 g 100 mL/hr over 30 Minutes Intravenous  Once 05/27/23 2036 05/27/23 2134       Objective: Vitals:   05/29/23 1347 05/29/23 2247  BP: 130/80 138/74  Pulse: 73 82  Resp: 16 18  Temp: 98.4 F (36.9 C) 98.4 F (36.9 C)  SpO2: 95% 91%    Intake/Output Summary (Last 24 hours) at 05/30/2023 1118 Last data filed at 05/30/2023 0800 Gross per 24 hour  Intake 1937.19 ml  Output 611 ml  Net 1326.19 ml   Filed Weights   05/27/23 2200  Weight: 81.8 kg   Weight change:  Body mass index is 28.24 kg/m.   Physical Exam: General exam: Pleasant, elderly Caucasian female Skin: No rashes, lesions or ulcers. HEENT: Atraumatic, normocephalic, no obvious bleeding Lungs: Clear to auscultation bilaterally,  CVS: S1,  S2, no murmur,   GI/Abd: Soft, appropriate postop tenderness, nondistended, bowel sound sluggish, JP drain with serous sanguinous discharge CNS: Alert, awake, able to have a conversation but slips into confusion in the middle of conversation. Psychiatry: Mood appropriate Extremities: No pedal edema, no calf tenderness,   Data Review: I have personally reviewed the laboratory data and studies available.  F/u labs  Unresulted Labs (From admission, onward)     Start     Ordered   05/31/23 0500  Basic metabolic panel with GFR  Tomorrow morning,   R        05/30/23 0820   05/31/23 0500  CBC with Differential/Platelet  Tomorrow morning,   R        05/30/23 0820   05/29/23 1023  H. pylori antigen, stool  Once,   R        05/29/23 1022   05/27/23 2035  Gastrointestinal Panel by PCR , Stool  (Gastrointestinal Panel by PCR, Stool                                                                                                                                                     **  Does Not include CLOSTRIDIUM DIFFICILE testing. **If CDIFF testing is needed, place order from the "C Difficile Testing" order set.**)  Once,   URGENT        05/27/23 2036   05/27/23 2035  C Difficile Quick Screen w PCR reflex  (C Difficile quick screen w PCR reflex panel )  Once, for 24 hours,   URGENT       References:    CDiff Information Tool   05/27/23 2036           Total time spent in review of labs and imaging, patient evaluation, formulation of plan, documentation and communication with family: 45 minutes  Signed, Hoyt Macleod, MD [facility] 05/30/2023

## 2023-05-30 NOTE — Progress Notes (Signed)
 Mobility Specialist - Progress Note   05/30/23 1045  Mobility  Activity Ambulated independently in hallway  Level of Assistance Independent  Assistive Device None  Distance Ambulated (ft) 500 ft  Activity Response Tolerated well  Mobility Referral Yes  Mobility visit 1 Mobility  Mobility Specialist Start Time (ACUTE ONLY) 1033  Mobility Specialist Stop Time (ACUTE ONLY) 1044  Mobility Specialist Time Calculation (min) (ACUTE ONLY) 11 min   Pt received in bed and agreeable to mobility. No complaints during session. Pt to bed after session with all needs met.    [facility]

## 2023-05-30 NOTE — Telephone Encounter (Signed)
 Spoke with patient's husband  Scheduled hospital f/u for 06/05/23 with Sandy Crumb, PA

## 2023-05-30 NOTE — Telephone Encounter (Signed)
 Received a call from Dr. Dortha Gauss  States that patient had abnormal results of blood and urine results  When he called patient to discuss results she was very confused - he was concerned and asked that she be seen today for evaluation . Patient scheduled today at 2:30pm

## 2023-05-31 ENCOUNTER — Inpatient Hospital Stay (HOSPITAL_COMMUNITY)

## 2023-05-31 DIAGNOSIS — K631 Perforation of intestine (nontraumatic): Secondary | ICD-10-CM | POA: Diagnosis not present

## 2023-05-31 LAB — GLUCOSE, CAPILLARY
Glucose-Capillary: 110 mg/dL — ABNORMAL HIGH (ref 70–99)
Glucose-Capillary: 63 mg/dL — ABNORMAL LOW (ref 70–99)
Glucose-Capillary: 72 mg/dL (ref 70–99)
Glucose-Capillary: 74 mg/dL (ref 70–99)
Glucose-Capillary: 75 mg/dL (ref 70–99)
Glucose-Capillary: 82 mg/dL (ref 70–99)
Glucose-Capillary: 97 mg/dL (ref 70–99)

## 2023-05-31 LAB — CBC WITH DIFFERENTIAL/PLATELET
Abs Immature Granulocytes: 0.05 10*3/uL (ref 0.00–0.07)
Basophils Absolute: 0 10*3/uL (ref 0.0–0.1)
Basophils Relative: 0 %
Eosinophils Absolute: 0.2 10*3/uL (ref 0.0–0.5)
Eosinophils Relative: 2 %
HCT: 33.6 % — ABNORMAL LOW (ref 36.0–46.0)
Hemoglobin: 10.7 g/dL — ABNORMAL LOW (ref 12.0–15.0)
Immature Granulocytes: 1 %
Lymphocytes Relative: 38 %
Lymphs Abs: 3.8 10*3/uL (ref 0.7–4.0)
MCH: 33.5 pg (ref 26.0–34.0)
MCHC: 31.8 g/dL (ref 30.0–36.0)
MCV: 105.3 fL — ABNORMAL HIGH (ref 80.0–100.0)
Monocytes Absolute: 0.7 10*3/uL (ref 0.1–1.0)
Monocytes Relative: 7 %
Neutro Abs: 5.4 10*3/uL (ref 1.7–7.7)
Neutrophils Relative %: 52 %
Platelets: 141 10*3/uL — ABNORMAL LOW (ref 150–400)
RBC: 3.19 MIL/uL — ABNORMAL LOW (ref 3.87–5.11)
RDW: 13.7 % (ref 11.5–15.5)
WBC: 10.2 10*3/uL (ref 4.0–10.5)
nRBC: 0 % (ref 0.0–0.2)

## 2023-05-31 LAB — BASIC METABOLIC PANEL WITH GFR
Anion gap: 9 (ref 5–15)
BUN: 8 mg/dL (ref 8–23)
CO2: 26 mmol/L (ref 22–32)
Calcium: 9.1 mg/dL (ref 8.9–10.3)
Chloride: 106 mmol/L (ref 98–111)
Creatinine, Ser: 0.88 mg/dL (ref 0.44–1.00)
GFR, Estimated: >60 mL/min (ref >60)
Glucose, Bld: 78 mg/dL (ref 70–99)
Potassium: 3.1 mmol/L — ABNORMAL LOW (ref 3.5–5.1)
Sodium: 141 mmol/L (ref 135–145)

## 2023-05-31 MED ORDER — POTASSIUM CHLORIDE CRYS ER 20 MEQ PO TBCR
40.0000 meq | EXTENDED_RELEASE_TABLET | Freq: Once | ORAL | Status: AC
  Administered 2023-05-31: 40 meq via ORAL
  Filled 2023-05-31: qty 2

## 2023-05-31 MED ORDER — IOHEXOL 300 MG/ML  SOLN
100.0000 mL | Freq: Once | INTRAMUSCULAR | Status: AC | PRN
  Administered 2023-05-31: 100 mL via ORAL

## 2023-05-31 MED ORDER — PIPERACILLIN-TAZOBACTAM 3.375 G IVPB
3.3750 g | Freq: Three times a day (TID) | INTRAVENOUS | Status: DC
Start: 2023-05-31 — End: 2023-06-01
  Administered 2023-05-31 – 2023-06-01 (×3): 3.375 g via INTRAVENOUS
  Filled 2023-05-31 (×3): qty 50

## 2023-05-31 NOTE — Progress Notes (Signed)
 Patient had CBG of 63. Gave 4 oz. Juice and went up to 68. Gave another 4 oz. And went to 110. Dr. Gwynneth Lessen was notified.

## 2023-05-31 NOTE — Progress Notes (Signed)
 PROGRESS NOTE  Amanda [name]  DOB: 23-Nov-1955  PCP: Amanda Knight, PA-C ZOX:[id number]  DOA: 05/27/2023  LOS: 4 days  Hospital Day: 5  Brief narrative: Amanda [name] is a 68 y.o. female with PMH significant for DM2, HTN, sleep apnea, arthritis, h/o Roux-en-Y bypass 2013. 4/20, patient presented to the ED with complaint of diarrhea, acute onset severe abdominal pain.  Labs showed WBC count of 12.3, lactic acid 2.3 Urinalysis suggestive of infection CT abdomen pelvis showed large amount of gas within the bladder with intraperitoneal gas concerning for anastomotic dehiscence.  There was also a 1.1 cm pancreatic tail lesion.   CTPA negative for pulm embolism Admitted to [facility] General Surgery was consulted 4/21, patient underwent laparoscopic Tyrone Gallop patch repair of perforated marginal ulcer, laparoscopic primary incisional hernia repair  Subjective: Patient was seen and examined this morning. Lying on bed.  Not in distress little confused but much better than yesterday morning.  Slept well last night.  Husband at bedside. Upper GI series planned by general surgery today.  Assessment and plan: Anastomotic ulcer perforation H/o Roux-en-Y gastric bypass 2013 Presented with acute onset severe abdominal pain CT abdomen suggestive of intraperitoneal gas concerning for anastomotic dehiscence Underwent laparoscopic Tyrone Gallop patch repair of perforated marginal ulcer by Dr. Elvan [name] Currently has gradually improving postop ileus. Passing flatus.  General surgery plans for upper GI series today. Continue IV antibiotics WBC count and lactic acid level trend as below.  Normal WBC count today. Recent Labs  Lab 05/27/23 1805 05/27/23 2117 05/28/23 0439 05/29/23 0500 05/31/23 0500  WBC 12.3*  --  20.1* 15.4* 10.2  LATICACIDVEN  --  2.3*  --  0.7  --    UTI Urinalysis 4/20 showed cloudy yellow urine with moderate leukocytes, positive nitrite, rare bacteria Blood culture and urine culture  have not shown any growth so far. Currently on IV Zosyn   Acute metabolic encephalopathy Alzheimer's dementia Patient has a nocturnal restlessness, disorientation that is worse on the night of 4/22.   She takes melatonin 10 mg nightly which is continued.   Since last night 4/23, Seroquel  25 mg nightly was also added.  Slept well last night.  Mental status better this morning.  Hypertension Continued losartan .  Keep Aldactone  on hold IV hydralazine as needed  Type 2 diabetes mellitus A1c 5.8 on 04/03/2023 PTA meds-metformin , Farxiga  Currently on SSI/Accu-Cheks Recent Labs  Lab 05/30/23 1713 05/30/23 2032 05/31/23 0539 05/31/23 0723 05/31/23 1209  GLUCAP 101* 86 74 72 75   H/o gout Continue allopurinol   HLD Continue Lipitor   Mobility: Encourage ambulation.  Per RN, she ambulates in the hallway often  Goals of care   Code Status: Full Code     DVT prophylaxis:  enoxaparin  (LOVENOX ) injection 40 mg Start: 05/28/23 2215 SCD's Start: 05/28/23 0145 SCDs Start: 05/27/23 2202   Antimicrobials: IV Zosyn  Fluid: Reduce LR to 50 mL/h Consultants: General Surgery Family Communication: None at bedside  Status: Inpatient Level of care:  Telemetry   Patient is from: Home Needs to continue in-hospital care: POD 4, has postop ileus resolving.  Pending upper GI series today.  Not stable for discharge yet. Anticipated d/c to: Pending clinical course   Diet:  Diet Order             Diet NPO time specified Except for: Sips with Meds  Diet effective now                   Scheduled Meds:  allopurinol   100 mg Oral Daily   enoxaparin  (LOVENOX ) injection  40 mg Subcutaneous Q24H   insulin  aspart  0-9 Units Subcutaneous Q4H   losartan   25 mg Oral Daily   multivitamin  15 mL Oral Daily   pantoprazole  (PROTONIX ) IV  40 mg Intravenous Q12H   QUEtiapine   25 mg Oral QHS   spironolactone   25 mg Oral Daily   sucralfate   1 g Oral Q6H    PRN meds: morphine  injection,  ondansetron  **OR** ondansetron  (ZOFRAN ) IV   Infusions:   lactated ringers  125 mL/hr at 05/31/23 0900   piperacillin -tazobactam (ZOSYN )  IV 3.375 g (05/31/23 1109)    Antimicrobials: Anti-infectives (From admission, onward)    Start     Dose/Rate Route Frequency Ordered Stop   05/31/23 1200  piperacillin -tazobactam (ZOSYN ) IVPB 3.375 g        3.375 g 12.5 mL/hr over 240 Minutes Intravenous Every 8 hours 05/31/23 1016 06/02/23 1159   05/28/23 0400  piperacillin -tazobactam (ZOSYN ) IVPB 3.375 g  Status:  Discontinued        3.375 g 12.5 mL/hr over 240 Minutes Intravenous Every 8 hours 05/27/23 2220 05/28/23 0144   05/28/23 0400  piperacillin -tazobactam (ZOSYN ) IVPB 3.375 g        3.375 g 12.5 mL/hr over 240 Minutes Intravenous Every 8 hours 05/28/23 0144 05/31/23 0009   05/27/23 2045  piperacillin -tazobactam (ZOSYN ) IVPB 3.375 g        3.375 g 100 mL/hr over 30 Minutes Intravenous  Once 05/27/23 2036 05/27/23 2134       Objective: Vitals:   05/30/23 2030 05/31/23 0542  BP: (!) 140/84 138/71  Pulse: 70 65  Resp: 16 17  Temp: 98.1 F (36.7 C) 98.6 F (37 C)  SpO2: 94% 94%    Intake/Output Summary (Last 24 hours) at 05/31/2023 1302 Last data filed at 05/31/2023 1000 Gross per 24 hour  Intake 2745.02 ml  Output 180 ml  Net 2565.02 ml   Filed Weights   05/27/23 2200  Weight: 81.8 kg   Weight change:  Body mass index is 28.24 kg/m.   Physical Exam: General exam: Pleasant, elderly Caucasian female Skin: No rashes, lesions or ulcers. HEENT: Atraumatic, normocephalic, no obvious bleeding Lungs: Clear to auscultation bilaterally,  CVS: S1, S2, no murmur,   GI/Abd: Soft, appropriate postop tenderness, nondistended, bowel sound sluggish, JP drain with serous sanguinous discharge CNS: Alert, awake, oriented x 3.  Improving overall mental status  Psychiatry: Mood appropriate Extremities: No pedal edema, no calf tenderness,   Data Review: I have personally reviewed the  laboratory data and studies available.  F/u labs  Unresulted Labs (From admission, onward)     Start     Ordered   05/29/23 1023  H. pylori antigen, stool  Once,   R        05/29/23 1022   05/27/23 2035  Gastrointestinal Panel by PCR , Stool  (Gastrointestinal Panel by PCR, Stool                                                                                                                                                     **  Does Not include CLOSTRIDIUM DIFFICILE testing. **If CDIFF testing is needed, place order from the "C Difficile Testing" order set.**)  Once,   URGENT        05/27/23 2036   05/27/23 2035  C Difficile Quick Screen w PCR reflex  (C Difficile quick screen w PCR reflex panel )  Once, for 24 hours,   URGENT       References:    CDiff Information Tool   05/27/23 2036           Total time spent in review of labs and imaging, patient evaluation, formulation of plan, documentation and communication with family: 45 minutes  Signed, Hoyt Macleod, MD [facility] 05/31/2023

## 2023-05-31 NOTE — Progress Notes (Signed)
 4 Days Post-Op  Subjective: CC: Seen w/ RN. Her husband is at bedside.  No abdominal pain, n/v. BM yesterday   Afebrile. No tachycardia or hypotension. WBC 10.2. K 3.1.   Objective: Vital signs in last 24 hours: Temp:  [97.7 F (36.5 C)-98.6 F (37 C)] 98.6 F (37 C) (04/24 0542) Pulse Rate:  [65-81] 65 (04/24 0542) Resp:  [16-17] 17 (04/24 0542) BP: (138-149)/(71-84) 138/71 (04/24 0542) SpO2:  [94 %-99 %] 94 % (04/24 0542) Last BM Date : 05/31/23  Intake/Output from previous day: 04/23 0701 - 04/24 0700 In: 2619.3 [P.O.:60; I.V.:2460; IV Piggyback:99.4] Out: 175 [Drains:175] Intake/Output this shift: Total I/O In: -  Out: 35 [Drains:35]  PE: Gen:  Alert, NAD, pleasant Abd: Soft, no distension, appropriately tender around laparoscopic incisions, no rigidity or guarding and otherwise NT, +BS. Incisions with glue intact appears well and are without drainage, bleeding, or signs of infection. JP drain SS - 175cc/24 hours  Lab Results:  Recent Labs    05/29/23 0500 05/31/23 0500  WBC 15.4* 10.2  HGB 10.5* 10.7*  HCT 31.9* 33.6*  PLT 124* 141*   BMET Recent Labs    05/29/23 0500 05/31/23 0500  NA 137 141  K 3.5 3.1*  CL 106 106  CO2 25 26  GLUCOSE 110* 78  BUN 17 8  CREATININE 0.98 0.88  CALCIUM  8.5* 9.1   PT/INR No results for input(s): "LABPROT", "INR" in the last 72 hours. CMP     Component Value Date/Time   NA 141 05/31/2023 0500   NA 148 (H) 04/03/2023 1041   K 3.1 (L) 05/31/2023 0500   CL 106 05/31/2023 0500   CO2 26 05/31/2023 0500   GLUCOSE 78 05/31/2023 0500   BUN 8 05/31/2023 0500   BUN 12 04/03/2023 1041   CREATININE 0.88 05/31/2023 0500   CREATININE 0.93 02/13/2022 1032   CALCIUM  9.1 05/31/2023 0500   PROT 6.5 05/27/2023 1805   PROT 6.5 04/03/2023 1041   ALBUMIN 3.7 05/27/2023 1805   ALBUMIN 4.1 04/03/2023 1041   AST 25 05/27/2023 1805   ALT 16 05/27/2023 1805   ALKPHOS 46 05/27/2023 1805   BILITOT 1.1 05/27/2023 1805    BILITOT 0.5 04/03/2023 1041   GFRNONAA >60 05/31/2023 0500   GFRNONAA 61 02/12/2020 1031   GFRAA 71 02/12/2020 1031   Lipase     Component Value Date/Time   LIPASE 27 05/27/2023 1805    Studies/Results: No results found.  Anti-infectives: Anti-infectives (From admission, onward)    Start     Dose/Rate Route Frequency Ordered Stop   05/28/23 0400  piperacillin -tazobactam (ZOSYN ) IVPB 3.375 g  Status:  Discontinued        3.375 g 12.5 mL/hr over 240 Minutes Intravenous Every 8 hours 05/27/23 2220 05/28/23 0144   05/28/23 0400  piperacillin -tazobactam (ZOSYN ) IVPB 3.375 g        3.375 g 12.5 mL/hr over 240 Minutes Intravenous Every 8 hours 05/28/23 0144 05/31/23 0009   05/27/23 2045  piperacillin -tazobactam (ZOSYN ) IVPB 3.375 g        3.375 g 100 mL/hr over 30 Minutes Intravenous  Once 05/27/23 2036 05/27/23 2134        Assessment/Plan POD4 s/p laparoscopic graham patch repair of perforated marginal ulcer, primary incisional hernia repair by Dr. Elvan Hamel on 4/21 - Plan UGI today. Strict NPO till resulted. If neg for leak, plan CLD - Cont abx 4d post op - Cont JP drain. SS currently.  - BID PPI -  Send stool for H.pylori  - Mobilize, pulm toilet    FEN: NPO, IVF per primary. Replace hypokalemia  VTE: SCDs, Lovenox   ID: zosyn  4/20>>   - per [facility] -  T2DM HTN OSA arthritis UTI    LOS: 4 days    Delton Filbert, [facility] Surgery 05/31/2023, 10:14 AM Please see Amion for pager number during day hours 7:00am-4:30pm

## 2023-05-31 NOTE — Progress Notes (Signed)
 Mobility Specialist - Progress Note   05/31/23 0926  Mobility  Activity Ambulated with assistance in hallway;Ambulated with assistance to bathroom  Level of Assistance Standby assist, set-up cues, supervision of patient - no hands on  Assistive Device Front wheel walker  Distance Ambulated (ft) 500 ft  Activity Response Tolerated well  Mobility Referral Yes  Mobility visit 1 Mobility  Mobility Specialist Start Time (ACUTE ONLY) 0847  Mobility Specialist Stop Time (ACUTE ONLY) [id number]  Mobility Specialist Time Calculation (min) (ACUTE ONLY) 37 min   Pt received in bed and agreeable to mobility. Prior to ambulating, pt requested assistance to the bathroom for BM. No complaints during session. Pt to bench after session with all needs met.    [facility]

## 2023-06-01 ENCOUNTER — Other Ambulatory Visit (HOSPITAL_COMMUNITY): Payer: Self-pay

## 2023-06-01 ENCOUNTER — Other Ambulatory Visit: Payer: Self-pay

## 2023-06-01 DIAGNOSIS — K631 Perforation of intestine (nontraumatic): Secondary | ICD-10-CM | POA: Diagnosis not present

## 2023-06-01 LAB — CULTURE, BLOOD (ROUTINE X 2)
Culture: NO GROWTH
Culture: NO GROWTH

## 2023-06-01 LAB — GLUCOSE, CAPILLARY
Glucose-Capillary: 94 mg/dL (ref 70–99)
Glucose-Capillary: 100 mg/dL — ABNORMAL HIGH (ref 70–99)
Glucose-Capillary: 152 mg/dL — ABNORMAL HIGH (ref 70–99)

## 2023-06-01 MED ORDER — PANTOPRAZOLE SODIUM 40 MG PO TBEC
40.0000 mg | DELAYED_RELEASE_TABLET | Freq: Two times a day (BID) | ORAL | 0 refills | Status: DC
  Filled 2023-06-01: qty 60, 30d supply, fill #0

## 2023-06-01 MED ORDER — PIPERACILLIN-TAZOBACTAM 3.375 G IVPB
3.3750 g | Freq: Three times a day (TID) | INTRAVENOUS | Status: DC
  Administered 2023-06-01: 3.375 g via INTRAVENOUS
  Filled 2023-06-01: qty 50

## 2023-06-01 MED ORDER — SUCRALFATE 1 GM/10ML PO SUSP
1.0000 g | Freq: Four times a day (QID) | ORAL | 0 refills | Status: DC
  Filled 2023-06-01: qty 560, 14d supply, fill #0

## 2023-06-01 MED ORDER — QUETIAPINE FUMARATE 25 MG PO TABS
25.0000 mg | ORAL_TABLET | Freq: Every evening | ORAL | 0 refills | Status: DC | PRN
  Filled 2023-06-01: qty 7, 7d supply, fill #0

## 2023-06-01 MED ORDER — SUCRALFATE 1 G PO TABS
1.0000 g | ORAL_TABLET | Freq: Four times a day (QID) | ORAL | 0 refills | Status: DC
  Filled 2023-06-01 (×2): qty 56, 14d supply, fill #0

## 2023-06-01 MED ORDER — PANTOPRAZOLE SODIUM 40 MG PO TBEC: 40.0000 mg | DELAYED_RELEASE_TABLET | Freq: Two times a day (BID) | ORAL | Status: DC

## 2023-06-01 NOTE — Discharge Instructions (Addendum)
 Ulcer Treatment  Perscriptions: - OMEPRAZOLE  (PRILOSEC OR MD APPROVED SUBSTITUTION) - CARAFATE  (SUCRALFATE )  SYMPTOMS WILL START TO IMPROVE IN 10-14 DAYS  YOU MUST TAKE THE REGIMEN AS DESCRIBED BELOW FOR THE FULL 90 DAYS OF THERAPY FOR MAXIMUM RESULTS  Instructions: - OMEPRAZOLE  UPON WAKING UP IN THE MORNING o (EAT BREAKFAST 30 MINUTES AFTER TAKING) o Open omeprazole  capsule and mix it in 2 ounces of water  and add a few drops of lemon/lime juice drink then drink as a slurry  - CARAFATE  30 MINUTES BEFORE EATING LUNCH o Crush the Carafate  tablet and add to 2-3 ounces of room temperature water , drink as a slurry  - REPEAT STEP #1 30 MINUTES PRIOR TO EATING DINNER  - REPEAT STEP # 2 PRIOR TO BEDTIME  IMPORTANT: DO NOT take Omeprazole  and Carafate  within 30 minutes of each other!    Avoid caffeine, NSAIDS (aspirin, motrin, advil, ibuprofen, naproxen, aleve, goodie powder, or BC powder), or alcohol.  If you have any questions concerning your regimen, please the office: [phone]  Managing Peptic Ulcers  What is a Peptic Ulcer? ? An ulcer is the breakdown in the lining or the tissue under the lining of the stomach or other area of the intestines.  The breakdown in these tissues can cause damage by stomach acid and digestive enzymes which can lead to pain, burning, and bleeding. What causes peptic ulcers? ? Smoking ? Alcohol ? H. pylori bacteria ? Use of NSAIDS (Aspirin, ibuprofen, Motrin, Advil, Aleve, Naprosyn, Mobic, Meloxicam) ? Caffeine ? Coffee or black tea (including decaf)  The symptoms of peptic ulcers are:  Nausea  Vomiting  Loss of appetite  Unintentional weight loss  Burping or hiccups  Abdominal pain or discomfort  Back pain  Black/tarry stools  Diet/Food restrictions and recommendations for ulcers: ? Avoid coffee and black tea ? Avoid tobacco ? Avoid alcohol ? Avoid milk and dairy products (try soy milk or unsweetened almond milk products  instead) ? Avoid onions, tomatoes, and citrus fruitsEat 5-6 small meals daily ? Try: green tea or chamomile tea ? Start a probiotic (strains including L. acidophilus, L. plantarum, and B. lungum have been shown to be beneficial) ? Try: 1 teaspoon aloe vera juice after meals ? Consume food high in vitamin C:      dark leafy greens (kale, spinach), red bell peppers, asparagus, cabbage,  cauliflower, cantaloupe, honeydew melon, apples, kiwi, and berries ? Consume more protein: lean meats, soy cheese, eggs, fish, tofu  If symptoms continue, complete a food log and follow up with your bariatric dietitian and bariatric surgeon.  [facility]  Please arrive at least 30 min before your appointment to complete your check in paperwork.  If you are unable to arrive 30 min prior to your appointment time we may have to cancel or reschedule you. LAPAROSCOPIC SURGERY: POST OP INSTRUCTIONS Always review your discharge instruction sheet given to you by the facility where your surgery was performed. IF YOU HAVE DISABILITY OR FAMILY LEAVE FORMS, YOU MUST BRING THEM TO THE OFFICE FOR PROCESSING.   DO NOT GIVE THEM TO YOUR DOCTOR.  PAIN CONTROL  First take acetaminophen  (Tylenol ) to control your pain after surgery.  Follow directions on package.  Taking acetaminophen  (Tylenol ) regularly after surgery will help to control your pain and lower the amount of prescription pain medication you may need.  You should not take more than 4,000 mg (4 grams) of acetaminophen  (Tylenol ) in 24 hours.  You should not take ibuprofen (  Advil), aleve, motrin, naprosyn or other NSAIDS if you have a history of stomach ulcers or chronic kidney disease.  A prescription for pain medication may be given to you upon discharge.  Take your pain medication as prescribed, if you still have uncontrolled pain after taking acetaminophen  (Tylenol ) or ibuprofen (Advil). Use ice packs to help control pain. If you need a refill  on your pain medication, please contact your pharmacy.  They will contact our office to request authorization. Prescriptions will not be filled after 5pm or on week-ends.  HOME MEDICATIONS Take your usually prescribed medications unless otherwise directed.  DIET You should follow a light diet the first few days after arrival home.  Be sure to include lots of fluids daily. Avoid fatty, fried foods.   CONSTIPATION It is common to experience some constipation after surgery and if you are taking pain medication.  Increasing fluid intake and taking a stool softener (such as Colace) will usually help or prevent this problem from occurring.  A mild laxative (Milk of Magnesia or Miralax) should be taken according to package instructions if there are no bowel movements after 48 hours.  WOUND/INCISION CARE Most patients will experience some swelling and bruising in the area of the incisions.  Ice packs will help.  Swelling and bruising can take several days to resolve.  Unless discharge instructions indicate otherwise, follow guidelines below  STERI-STRIPS - you may remove your outer bandages 48 hours after surgery, and you may shower at that time.  You have steri-strips (small skin tapes) in place directly over the incision.  These strips should be left on the skin for 7-10 days.   DERMABOND/SKIN GLUE - you may shower in 24 hours.  The glue will flake off over the next 2-3 weeks. Any sutures or staples will be removed at the office during your follow-up visit.  ACTIVITIES You may resume regular (light) daily activities beginning the next day--such as daily self-care, walking, climbing stairs--gradually increasing activities as tolerated.  You may have sexual intercourse when it is comfortable.  Refrain from any heavy lifting or straining until approved by your doctor. You may drive when you are no longer taking prescription pain medication, you can comfortably wear a seatbelt, and you can safely maneuver  your car and apply brakes.  FOLLOW-UP You should see your doctor in the office for a follow-up appointment approximately 2-3 weeks after your surgery.  You should have been given your post-op/follow-up appointment when your surgery was scheduled.  If you did not receive a post-op/follow-up appointment, make sure that you call for this appointment within a day or two after you arrive home to insure a convenient appointment time.   WHEN TO CALL YOUR DOCTOR: Fever over 101.0 Inability to urinate Continued bleeding from incision. Increased pain, redness, or drainage from the incision. Increasing abdominal pain  The clinic staff is available to answer your questions during regular business hours.  Please don't hesitate to call and ask to speak to one of the nurses for clinical concerns.  If you have a medical emergency, go to the nearest emergency room or call 911.  A surgeon from [facility] is always on call at the hospital. [address], [place], [place]  [address] ? [address] [address], [place], [place]   [address] [phone] ? [phone] ? FAX [phone]

## 2023-06-01 NOTE — Progress Notes (Signed)
 5 Days Post-Op  Subjective: CC: UGI neg for leak. Tolerating bariatric cld without abdominal pain, n/v. BM yesterday  Afebrile. No tachycardia or hypotension. WBC wnl yesterday.   Objective: Vital signs in last 24 hours: Temp:  [98.1 F (36.7 C)-98.5 F (36.9 C)] 98.5 F (36.9 C) (04/25 0448) Pulse Rate:  [61-71] 61 (04/25 0448) Resp:  [16-18] 18 (04/25 0448) BP: (146-169)/(72-96) 146/72 (04/25 0448) SpO2:  [93 %-96 %] 93 % (04/25 0448) Weight:  [85.5 kg] 85.5 kg (04/24 2046) Last BM Date : 06/01/23  Intake/Output from previous day: 04/24 0701 - 04/25 0700 In: 2387.1 [P.O.:840; I.V.:1447.1; IV Piggyback:100] Out: 1585 [Urine:1300; Drains:285] Intake/Output this shift: Total I/O In: 471.7 [P.O.:120; I.V.:301.7; IV Piggyback:50] Out: 80 [Drains:80]  PE: Gen:  Alert, NAD, pleasant Abd: Soft, no distension, appropriately tender around laparoscopic incisions, no rigidity or guarding and otherwise NT, +BS. Incisions with glue intact appears well and are without drainage, bleeding, or signs of infection. JP drain SS - 285cc/24 hours  Lab Results:  Recent Labs    05/31/23 0500  WBC 10.2  HGB 10.7*  HCT 33.6*  PLT 141*   BMET Recent Labs    05/31/23 0500  NA 141  K 3.1*  CL 106  CO2 26  GLUCOSE 78  BUN 8  CREATININE 0.88  CALCIUM  9.1   PT/INR No results for input(s): "LABPROT", "INR" in the last 72 hours. CMP     Component Value Date/Time   NA 141 05/31/2023 0500   NA 148 (H) 04/03/2023 1041   K 3.1 (L) 05/31/2023 0500   CL 106 05/31/2023 0500   CO2 26 05/31/2023 0500   GLUCOSE 78 05/31/2023 0500   BUN 8 05/31/2023 0500   BUN 12 04/03/2023 1041   CREATININE 0.88 05/31/2023 0500   CREATININE 0.93 02/13/2022 1032   CALCIUM  9.1 05/31/2023 0500   PROT 6.5 05/27/2023 1805   PROT 6.5 04/03/2023 1041   ALBUMIN 3.7 05/27/2023 1805   ALBUMIN 4.1 04/03/2023 1041   AST 25 05/27/2023 1805   ALT 16 05/27/2023 1805   ALKPHOS 46 05/27/2023 1805   BILITOT  1.1 05/27/2023 1805   BILITOT 0.5 04/03/2023 1041   GFRNONAA >60 05/31/2023 0500   GFRNONAA 61 02/12/2020 1031   GFRAA 71 02/12/2020 1031   Lipase     Component Value Date/Time   LIPASE 27 05/27/2023 1805    Studies/Results: DG UGI W SINGLE CM (SOL OR THIN BA) Result Date: 05/31/2023 CLINICAL DATA:  3 day postop for laparoscopic Tyrone Gallop patch repair of perforated marginal ulcer, 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 EXAM: WATER  SOLUBLE UPPER GI SERIES TECHNIQUE: Single-column upper GI series was performed using water  soluble contrast. Radiation Exposure Index (as provided by the fluoroscopic device): 42 mGy Kerma COMPARISON:  CT 05/27/2023 FINDINGS: Preprocedure scout film demonstrates surgical drain terminate in the left upper quadrant. Surgical sutures within the left upper abdomen. Focused, single-contrast exam performed with patient supine and in various posterior obliquities. Relatively large gastric remnant empties promptly. Normal caliber of the Roux loop, without contrast extravasation. IMPRESSION: Status post repair of marginal perforated ulcer in this patient is status post Roux-en-Y gastric bypass. No evidence of postoperative leak or obstruction. Electronically Signed   By: Lore Rode M.D.   On: 05/31/2023 15:33    Anti-infectives: Anti-infectives (From admission, onward)    Start     Dose/Rate Route Frequency Ordered Stop   05/31/23 1200  piperacillin -tazobactam (ZOSYN ) IVPB 3.375 g  3.375 g 12.5 mL/hr over 240 Minutes Intravenous Every 8 hours 05/31/23 1016 06/02/23 1159   05/28/23 0400  piperacillin -tazobactam (ZOSYN ) IVPB 3.375 g  Status:  Discontinued        3.375 g 12.5 mL/hr over 240 Minutes Intravenous Every 8 hours 05/27/23 2220 05/28/23 0144   05/28/23 0400  piperacillin -tazobactam (ZOSYN ) IVPB 3.375 g        3.375 g 12.5 mL/hr over 240 Minutes Intravenous Every 8 hours 05/28/23 0144 05/31/23 0009   05/27/23 2045  piperacillin -tazobactam (ZOSYN )  IVPB 3.375 g        3.375 g 100 mL/hr over 30 Minutes Intravenous  Once 05/27/23 2036 05/27/23 2134        Assessment/Plan POD4 s/p laparoscopic graham patch repair of perforated marginal ulcer, primary incisional hernia repair by Dr. Elvan Hamel on 4/21 - UGI POD 3 neg for leak - Tolerating bariatric cld. Adv to bariatric advanced diet - Completes 4d post op abx today. Afebrile. WBC wnl on last check.  - Cont JP drain. SS currently. Will discuss w/ MD about removing prior to d/c - BID PPI, Carafate  q6 hrs - Send stool for H.pylori - pending - Mobilize, pulm toilet - If tolerates bariatric advanced diet today, okay for d/c from our standpoint.     FEN: Bariatric advanced, IVF per primary.  VTE: SCDs, Lovenox   ID: [email or web address]  4/20 - 4/25   - per [facility] -  T2DM HTN OSA arthritis UTI    LOS: 5 days    Delton Filbert, [facility] 06/01/2023, 10:14 AM Please see Amion for pager number during day hours 7:00am-4:30pm

## 2023-06-01 NOTE — Progress Notes (Signed)
 Discharge instructions given to patient and all questions were answered. Patient and husband expressed understanding of drain care.

## 2023-06-01 NOTE — Plan of Care (Signed)

## 2023-06-01 NOTE — Discharge Summary (Addendum)
 Physician Discharge Summary  Amanda [name] NWG:[id number] DOB: 26-Mar-1955 DOA: 05/27/2023  PCP: Araceli Knight, PA-C  Admit date: 05/27/2023 Discharge date: 06/01/2023  Admitted From: home Discharge disposition: home  Recommendations at discharge:  Continue Protonix  and Carafate  at home Follow-up with general surgery as outpatient You have been started on Seroquel  25 mg nightly for sundowning.  Continue as needed.   Brief narrative: Amanda [name] is a 68 y.o. female with PMH significant for DM2, HTN, sleep apnea, arthritis, h/o Roux-en-Y bypass 2013. 4/20, patient presented to the ED with complaint of diarrhea, acute onset severe abdominal pain.  Labs showed WBC count of 12.3, lactic acid 2.3 Urinalysis suggestive of infection CT abdomen pelvis showed large amount of gas within the bladder with intraperitoneal gas concerning for anastomotic dehiscence.  There was also a 1.1 cm pancreatic tail lesion.   CTPA negative for pulm embolism Admitted to [facility] General Surgery was consulted 4/21, patient underwent laparoscopic [place] Gallop patch repair of perforated marginal ulcer, laparoscopic primary incisional hernia repair  Subjective: Patient was seen and examined this morning. Sitting up in recliner.  Not in distress.  Tolerated clear liquid diet.  Was able to tolerate soft diet for lunch today.  Stable for discharge per general surgery. Confusion clearing up.  Seroquel  nightly seems to be helping her.  Hospital course: Anastomotic ulcer perforation H/o Roux-en-Y gastric bypass 2013 Presented with acute onset severe abdominal pain CT abdomen suggestive of intraperitoneal gas concerning for anastomotic dehiscence 4/21 underwent laparoscopic [place] Gallop patch repair of perforated marginal ulcer by Dr. Elvan Hamel Gradually improved postop ileus. 4/24, upper GI series was obtained which ruled out any leak.  Tolerated diet. Per general surgery, okay to discharge today on Protonix  and  Carafate . Follow-up with surgery as an outpatient. WC count and lactic acid level trended down with IV Zosyn .  Completed 5-day course Recent Labs  Lab 05/27/23 1805 05/27/23 2117 05/28/23 0439 05/29/23 0500 05/31/23 0500  WBC 12.3*  --  20.1* 15.4* 10.2  LATICACIDVEN  --  2.3*  --  0.7  --    UTI Urinalysis 4/20 showed cloudy yellow urine with moderate leukocytes, positive nitrite, rare bacteria Blood culture and urine culture have not shown any growth so far. Completed 5 course of IV Zosyn  in the hospital. Patient has chronic UTI and follows up with Dr. Manuel Sell as an outpatient.  She states he is on Macrobid  chronically.  Continue same.  Acute metabolic encephalopathy Alzheimer's dementia Her hospitalization was complicated by nocturnal restlessness, disorientation that is worse on the night of 4/22.   She takes melatonin 10 mg nightly which is continued.   4/23, Seroquel  25 mg nightly was also added.  Seems to be helping her.  Mental status much better. Prescription given for few more days of Seroquel  to continue at home as needed.  Hypertension Continued losartan  and Aldactone  as before.  Type 2 diabetes mellitus A1c 5.8 on 04/03/2023 PTA meds-metformin , Farxiga  Continue same Recent Labs  Lab 05/31/23 2014 05/31/23 2356 06/01/23 0402 06/01/23 0807 06/01/23 1113  GLUCAP 97 82 94 100* 152*   H/o gout Continue allopurinol   HLD Continue Lipitor  Independently able to ambulate  Diet:  Diet Order             Diet bariatric advanced Room service appropriate? Yes; Fluid consistency: Thin  Diet effective now           Diet general  Nutritional status:  Body mass index is 29.52 kg/m.       Wounds:  - Incision - 3 Ports Abdomen 1: Right;Mid;Lateral 2: Umbilicus 3: Left;Mid;Lateral (Active)  Placement Date/Time: 05/27/23 2311   Location of Ports: Abdomen  Port: 1:  Location Orientation: Right;Mid;Lateral  Port: 2:  Location  Orientation: Umbilicus  Port: 3:  Location Orientation: Left;Mid;Lateral    Assessments 05/28/2023 12:49 AM 06/01/2023  8:12 AM  Port 1 Site Assessment Clean;Dry Clean;Dry  Port 1 Margins -- Attached edges (approximated)  Port 1 Drainage Amount -- None  Port 1 Drainage Description -- No odor  Port 1 Dressing Type -- Liquid skin adhesive  Port 1 Dressing Status -- Clean, Dry, Intact  Port 2 Site Assessment -- Dry;Clean  Port 2 Margins -- Attached edges (approximated)  Port 2 Drainage Amount -- None  Port 2 Drainage Description -- No odor  Port 2 Dressing Type -- Liquid skin adhesive  Port 2 Dressing Status -- Clean, Dry, Intact  Port 3 Site Assessment -- Dry;Clean  Port 3 Margins -- Attached edges (approximated)  Port 3 Drainage Amount -- None  Port 3 Drainage Description -- No odor  Port 3 Dressing Type -- Liquid skin adhesive  Port 3 Dressing Status -- Clean, Dry, Intact     No associated orders.    Discharge Exam:   Vitals:   05/31/23 2018 05/31/23 2046 06/01/23 0448 06/01/23 1329  BP: (!) 146/75  (!) 146/72 (!) 163/84  Pulse: 66  61 (!) 58  Resp: 18  18 18   Temp: 98.1 F (36.7 C)  98.5 F (36.9 C) 98 F (36.7 C)  TempSrc: Oral  Oral Oral  SpO2: 96%  93% 98%  Weight:  85.5 kg    Height:  5\' 7"  (1.702 m)      Body mass index is 29.52 kg/m.   General exam: Pleasant, elderly Caucasian female Skin: No rashes, lesions or ulcers. HEENT: Atraumatic, normocephalic, no obvious bleeding Lungs: Clear to auscultation bilaterally,  CVS: S1, S2, no murmur,   GI/Abd: Soft, appropriate postop tenderness, bowel sound present  CNS: Alert, awake, oriented x 3.  Improving overall mental status  Psychiatry: Mood appropriate Extremities: No pedal edema, no calf tenderness,   Follow ups:    Follow-up Information     Aldean Hummingbird, MD Follow up on 06/22/2023.   Specialty: General Surgery Why: 945am. Please bring a copy of your photo ID, insurance card, and arrive 30 minutes prior  to your appointment for paperwork. Contact information: [address] [place] [address] [phone]         [facility] Follow up on 06/06/2023.   Specialty: General Surgery Why: 10am. This is a nurse visit for drain check and possible removal. Please bring a copy of your photo ID, insurance card, and arrive 30 minutes prior to your appointment for paperwork. Contact information: [address] [place] [address] [phone]         Sandy Crumb L, PA-C Follow up.   Specialty: Family Medicine Contact information: [address] [place] [address] [phone]                 Discharge Instructions:   Discharge Instructions     Call MD for:  difficulty breathing, headache or visual disturbances   Complete by: As directed    Call MD for:  extreme fatigue   Complete by: As directed    Call MD  for:  hives   Complete by: As directed    Call MD for:  persistant dizziness or light-headedness   Complete by: As directed    Call MD for:  persistant nausea and vomiting   Complete by: As directed    Call MD for:  severe uncontrolled pain   Complete by: As directed    Call MD for:  temperature >100.4   Complete by: As directed    Diet general   Complete by: As directed    Discharge instructions   Complete by: As directed    Recommendations at discharge:   Continue Protonix  and Carafate  at home  Follow-up with general surgery as outpatient  You have been started on Seroquel  25 mg nightly for sundowning.  Continue as needed.  General discharge instructions: Follow with Primary MD Araceli Knight, PA-C in 7 days  Please request your PCP  to go over your hospital tests, procedures, radiology results at the follow up. Please get your medicines reviewed and adjusted.  Your PCP may decide to repeat certain labs or tests as needed. Do not drive, operate heavy machinery, perform activities at  heights, swimming or participation in water  activities or provide baby sitting services if your were admitted for syncope or siezures until you have seen by Primary MD or a Neurologist and advised to do so again. [place]  Controlled Substance Reporting System database was reviewed. Do not drive, operate heavy machinery, perform activities at heights, swim, participate in water  activities or provide baby-sitting services while on medications for pain, sleep and mood until your outpatient physician has reevaluated you and advised to do so again.  You are strongly recommended to comply with the dose, frequency and duration of prescribed medications. Activity: As tolerated with Full fall precautions use walker/cane & assistance as needed Avoid using any recreational substances like cigarette, tobacco, alcohol, or non-prescribed drug. If you experience worsening of your admission symptoms, develop shortness of breath, life threatening emergency, suicidal or homicidal thoughts you must seek medical attention immediately by calling 911 or calling your MD immediately  if symptoms less severe. You must read complete instructions/literature along with all the possible adverse reactions/side effects for all the medicines you take and that have been prescribed to you. Take any new medicine only after you have completely understood and accepted all the possible adverse reactions/side effects.  Wear Seat belts while driving. You were cared for by a hospitalist during your hospital stay. If you have any questions about your discharge medications or the care you received while you were in the hospital after you are discharged, you can call the unit and ask to speak with the hospitalist or the covering physician. Once you are discharged, your primary care physician will handle any further medical issues. Please note that NO REFILLS for any discharge medications will be authorized once you are discharged, as it is  imperative that you return to your primary care physician (or establish a relationship with a primary care physician if you do not have one).   Increase activity slowly   Complete by: As directed        Discharge Medications:   Allergies as of 06/01/2023       Reactions   Fenofibrate Other (See Comments)   Constipation   Ozempic  (0.25 Or 0.5 Mg-dose) [semaglutide (0.25 Or 0.5mg -dos)]    nausea        Medication List     STOP taking these medications    omeprazole  20 MG capsule  Commonly known as: PRILOSEC       TAKE these medications    Accu-Chek Guide test strip Generic drug: glucose blood Use up to four times daily as directed   Accu-Chek Guide w/Device Kit Use up to four times daily as directed   Accu-Chek Softclix Lancets lancets Use up to four times daily as directed   acetaminophen  500 MG tablet Commonly known as: TYLENOL  Take 500 mg by mouth every 6 (six) hours as needed for moderate pain (pain score 4-6).   alendronate  70 MG tablet Commonly known as: Fosamax  Take 1 tablet (70 mg total) by mouth every 7 (seven) days.   allopurinol  100 MG tablet Commonly known as: ZYLOPRIM  Take 1 tablet (100 mg total) by mouth daily. Needs appt What changed: when to take this   atorvastatin  40 MG tablet Commonly known as: LIPITOR Take 1 tablet (40 mg total) by mouth daily.   calcium  citrate 950 (200 Ca) MG tablet Commonly known as: CALCITRATE - dosed in mg elemental calcium  Take 200 mg of elemental calcium  by mouth daily.   estradiol  0.1 MG/GM vaginal cream Commonly known as: ESTRACE  VAGINAL Apply pea-sized amount over urethral opening daily.   Farxiga  10 MG Tabs tablet Generic drug: dapagliflozin  propanediol Take 1 tablet (10 mg total) by mouth daily.   ferrous sulfate 324 MG Tbec Take 324 mg by mouth every evening.   fexofenadine 180 MG tablet Commonly known as: ALLEGRA Take 180 mg by mouth daily as needed for allergies.   fluticasone 50 MCG/ACT  nasal spray Commonly known as: FLONASE Place 1 spray into both nostrils daily as needed for allergies.   FreeStyle Libre 14 Day Sensor Misc Apply to upper deltoid every 14 days. Use reader to determine blood sugars   gabapentin  300 MG capsule Commonly known as: NEURONTIN  Take 1-2 capsules (300-600 mg total) by mouth 3 (three) times daily. What changed: Another medication with the same name was removed. Continue taking this medication, and follow the directions you see here.   ibuprofen 800 MG tablet Commonly known as: ADVIL Take 800 mg by mouth every 8 (eight) hours as needed.   losartan  25 MG tablet Commonly known as: COZAAR  Take 1 tablet (25 mg total) by mouth daily. What changed: when to take this   metFORMIN  1000 MG tablet Commonly known as: GLUCOPHAGE  Take 1 tablet (1,000 mg total) by mouth 2 (two) times daily with a meal.   methocarbamol  500 MG tablet Commonly known as: ROBAXIN  Take 1 tablet (500 mg total) by mouth 4 (four) times daily. What changed:  when to take this reasons to take this   Multi-Vitamin tablet Take 1 tablet by mouth in the morning and at bedtime.   nitrofurantoin  50 MG capsule Commonly known as: Macrodantin  Take 1 capsule by mouth daily.   pantoprazole  40 MG tablet Commonly known as: PROTONIX  Take 1 tablet (40 mg total) by mouth 2 (two) times daily.   QUEtiapine  25 MG tablet Commonly known as: SEROQUEL  Take 1 tablet (25 mg total) by mouth at bedtime as needed for up to 7 days.   spironolactone  25 MG tablet Commonly known as: ALDACTONE  Take 1 tablet (25 mg total) by mouth daily. What changed: when to take this   sucralfate  1 g tablet Commonly known as: Carafate  Take 1 tablet (1 g total) by mouth 4 (four) times daily for 14 days.         The results of significant diagnostics from this hospitalization (including imaging, microbiology, ancillary and laboratory) are  listed below for reference.    Procedures and Diagnostic Studies:    CT Angio Chest PE W and/or Wo Contrast Addendum Date: 05/27/2023 ADDENDUM REPORT: 05/27/2023 20:47 ADDENDUM: Addendum to include additional impression point. 7. Large amount of gas within the bladder. Correlate for recent instrumentation. Electronically Signed   By: Rozell Cornet M.D.   On: 05/27/2023 20:47   Result Date: 05/27/2023 CLINICAL DATA:  Mid abdominal pain radiating to the left shoulder. Diarrhea and weakness for 3-4 days. PE suspected. EXAM: CT ANGIOGRAPHY CHEST CT ABDOMEN AND PELVIS WITH CONTRAST TECHNIQUE: Multidetector CT imaging of the chest was performed using the standard protocol during bolus administration of intravenous contrast. Multiplanar CT image reconstructions and MIPs were obtained to evaluate the vascular anatomy. Multidetector CT imaging of the abdomen and pelvis was performed using the standard protocol during bolus administration of intravenous contrast. RADIATION DOSE REDUCTION: This exam was performed according to the departmental dose-optimization program which includes automated exposure control, adjustment of the mA and/or kV according to patient size and/or use of iterative reconstruction technique. CONTRAST:  OMNIPAQUE  IOHEXOL  350 MG/ML SOLN COMPARISON:  Radiographs 09/15/2022; FINDINGS: CTA CHEST FINDINGS Cardiovascular: Normal heart size. No pericardial effusion. Coronary artery and aortic atherosclerotic calcification. Negative for acute pulmonary embolism. Mediastinum/Nodes: Trachea and esophagus are unremarkable. No thoracic adenopathy. Lungs/Pleura: Lungs are clear. No pleural effusion or pneumothorax. Musculoskeletal: No acute fracture. Review of the MIP images confirms the above findings. CT ABDOMEN and PELVIS FINDINGS Hepatobiliary: Hepatic steatosis. Cholecystectomy. No biliary dilation. Pancreas: Pancreatic atrophy. No ductal dilation. 1.1 cm cystic lesion in the pancreatic tail (series 4/image 25). Spleen: Unremarkable. Adrenals/Urinary Tract: Normal  adrenal glands. No urinary calculi or hydronephrosis. Cortical calcification in the posterior left kidney. Large amount of gas within the bladder. Stomach/Bowel: Postoperative change of Roux-en-Y gastric bypass. Small amount of fluid and tiny locule of free intraperitoneal air extending from the gastro jejunal anastomosis (series 8/image 108). Mild wall thickening and mucosal hyperenhancement about the jejunum immediately distal to the gastrojejunal anastomosis. No bowel obstruction.  The appendix is not visualized. Vascular/Lymphatic: Aortic atherosclerosis. No enlarged abdominal or pelvic lymph nodes. Reproductive: Uterus and bilateral adnexa are unremarkable. Other: Free fluid and tiny locule of free intraperitoneal air about the gastro jejunal anastomosis in the left upper quadrant. No abscess. Musculoskeletal: No acute fracture. Review of the MIP images confirms the above findings. IMPRESSION: 1. Postoperative change of Roux-en-Y gastric bypass. Free fluid and tiny locule of intraperitoneal gas about the gastro jejunal anastomosis in the left upper quadrant. Findings are concerning for anastomotic dehiscence. 2. Enteritis about the jejunum distal to the gastro jejunal anastomosis. 3. No pulmonary embolism or acute abnormality in the chest. 4. Hepatic steatosis. 5. 1.1 cm cystic lesion in the pancreatic tail. Most likely a benign IPMN or pseudocyst. Follow-up pancreatic protocol CT or MRI in 2 years is recommended. 6.  Aortic Atherosclerosis (ICD10-I70.0). Critical Value/emergent results were called by telephone at the time of interpretation on 05/27/2023 at 8:25 pm to provider Dr. Jennetta Modena, who verbally acknowledged these results. Electronically Signed: By: Rozell Cornet M.D. On: 05/27/2023 20:32   CT ABDOMEN PELVIS W CONTRAST Addendum Date: 05/27/2023 ADDENDUM REPORT: 05/27/2023 20:47 ADDENDUM: Addendum to include additional impression point. 7. Large amount of gas within the bladder. Correlate for recent  instrumentation. Electronically Signed   By: Rozell Cornet M.D.   On: 05/27/2023 20:47   Result Date: 05/27/2023 CLINICAL DATA:  Mid abdominal pain radiating to the left shoulder. Diarrhea and weakness for 3-4 days.  PE suspected. EXAM: CT ANGIOGRAPHY CHEST CT ABDOMEN AND PELVIS WITH CONTRAST TECHNIQUE: Multidetector CT imaging of the chest was performed using the standard protocol during bolus administration of intravenous contrast. Multiplanar CT image reconstructions and MIPs were obtained to evaluate the vascular anatomy. Multidetector CT imaging of the abdomen and pelvis was performed using the standard protocol during bolus administration of intravenous contrast. RADIATION DOSE REDUCTION: This exam was performed according to the departmental dose-optimization program which includes automated exposure control, adjustment of the mA and/or kV according to patient size and/or use of iterative reconstruction technique. CONTRAST:  OMNIPAQUE  IOHEXOL  350 MG/ML SOLN COMPARISON:  Radiographs 09/15/2022; FINDINGS: CTA CHEST FINDINGS Cardiovascular: Normal heart size. No pericardial effusion. Coronary artery and aortic atherosclerotic calcification. Negative for acute pulmonary embolism. Mediastinum/Nodes: Trachea and esophagus are unremarkable. No thoracic adenopathy. Lungs/Pleura: Lungs are clear. No pleural effusion or pneumothorax. Musculoskeletal: No acute fracture. Review of the MIP images confirms the above findings. CT ABDOMEN and PELVIS FINDINGS Hepatobiliary: Hepatic steatosis. Cholecystectomy. No biliary dilation. Pancreas: Pancreatic atrophy. No ductal dilation. 1.1 cm cystic lesion in the pancreatic tail (series 4/image 25). Spleen: Unremarkable. Adrenals/Urinary Tract: Normal adrenal glands. No urinary calculi or hydronephrosis. Cortical calcification in the posterior left kidney. Large amount of gas within the bladder. Stomach/Bowel: Postoperative change of Roux-en-Y gastric bypass. Small amount of  fluid and tiny locule of free intraperitoneal air extending from the gastro jejunal anastomosis (series 8/image 108). Mild wall thickening and mucosal hyperenhancement about the jejunum immediately distal to the gastrojejunal anastomosis. No bowel obstruction.  The appendix is not visualized. Vascular/Lymphatic: Aortic atherosclerosis. No enlarged abdominal or pelvic lymph nodes. Reproductive: Uterus and bilateral adnexa are unremarkable. Other: Free fluid and tiny locule of free intraperitoneal air about the gastro jejunal anastomosis in the left upper quadrant. No abscess. Musculoskeletal: No acute fracture. Review of the MIP images confirms the above findings. IMPRESSION: 1. Postoperative change of Roux-en-Y gastric bypass. Free fluid and tiny locule of intraperitoneal gas about the gastro jejunal anastomosis in the left upper quadrant. Findings are concerning for anastomotic dehiscence. 2. Enteritis about the jejunum distal to the gastro jejunal anastomosis. 3. No pulmonary embolism or acute abnormality in the chest. 4. Hepatic steatosis. 5. 1.1 cm cystic lesion in the pancreatic tail. Most likely a benign IPMN or pseudocyst. Follow-up pancreatic protocol CT or MRI in 2 years is recommended. 6.  Aortic Atherosclerosis (ICD10-I70.0). Critical Value/emergent results were called by telephone at the time of interpretation on 05/27/2023 at 8:25 pm to provider Dr. Jennetta Modena, who verbally acknowledged these results. Electronically Signed: By: Rozell Cornet M.D. On: 05/27/2023 20:32     Labs:   Basic Metabolic Panel: Recent Labs  Lab 05/27/23 1805 05/28/23 0439 05/29/23 0500 05/31/23 0500  NA 141 136 137 141  K 3.8 3.8 3.5 3.1*  CL 110 107 106 106  CO2 20* 19* 25 26  GLUCOSE 176* 173* 110* 78  BUN 14 16 17 8   CREATININE 0.96 0.93 0.98 0.88  CALCIUM  10.1 8.9 8.5* 9.1   GFR Estimated Creatinine Clearance: 69.7 mL/min (by C-G formula based on SCr of 0.88 mg/dL). Liver Function Tests: Recent Labs  Lab  05/27/23 1805  AST 25  ALT 16  ALKPHOS 46  BILITOT 1.1  PROT 6.5  ALBUMIN 3.7   Recent Labs  Lab 05/27/23 1805  LIPASE 27   No results for input(s): "AMMONIA" in the last 168 hours. Coagulation profile No results for input(s): "INR", "PROTIME" in the last 168 hours.  CBC: Recent  Labs  Lab 05/27/23 1805 05/28/23 0439 05/29/23 0500 05/31/23 0500  WBC 12.3* 20.1* 15.4* 10.2  NEUTROABS  --   --  11.4* 5.4  HGB 13.5 11.5* 10.5* 10.7*  HCT 41.5 37.0 31.9* 33.6*  MCV 103.2* 107.2* 102.6* 105.3*  PLT 188 133* 124* 141*   Cardiac Enzymes: No results for input(s): "CKTOTAL", "CKMB", "CKMBINDEX", "TROPONINI" in the last 168 hours. BNP: Invalid input(s): "POCBNP" CBG: Recent Labs  Lab 05/31/23 2014 05/31/23 2356 06/01/23 0402 06/01/23 0807 06/01/23 1113  GLUCAP 97 82 94 100* 152*   D-Dimer No results for input(s): "DDIMER" in the last 72 hours. Hgb A1c No results for input(s): "HGBA1C" in the last 72 hours. Lipid Profile No results for input(s): "CHOL", "HDL", "LDLCALC", "TRIG", "CHOLHDL", "LDLDIRECT" in the last 72 hours. Thyroid  function studies No results for input(s): "TSH", "T4TOTAL", "T3FREE", "THYROIDAB" in the last 72 hours.  Invalid input(s): "FREET3" Anemia work up No results for input(s): "VITAMINB12", "FOLATE", "FERRITIN", "TIBC", "IRON", "RETICCTPCT" in the last 72 hours. Microbiology Recent Results (from the past 240 hours)  Culture, blood (routine x 2)     Status: None   Collection Time: 05/27/23  8:59 PM   Specimen: BLOOD  Result Value Ref Range Status   Specimen Description   Final    BLOOD BLOOD LEFT ARM Performed at [facility], [address] [place], [place] [address]    Special Requests   Final    BOTTLES DRAWN AEROBIC AND ANAEROBIC Blood Culture results may not be optimal due to an inadequate volume of blood received in culture bottles Performed at [facility], [address] [place],  [place] [address]    Culture   Final    NO GROWTH 5 DAYS Performed at [facility], [address] [place], [place] [address]    Report Status 06/01/2023 FINAL  Final  Culture, blood (routine x 2)     Status: None   Collection Time: 05/27/23  9:04 PM   Specimen: BLOOD  Result Value Ref Range Status   Specimen Description   Final    BLOOD BLOOD LEFT ARM Performed at [facility], [address], [place], [place] [address]    Special Requests   Final    BOTTLES DRAWN AEROBIC AND ANAEROBIC Blood Culture results may not be optimal due to an inadequate volume of blood received in culture bottles Performed at [facility], [address] [place], [place] [address]    Culture   Final    NO GROWTH 5 DAYS Performed at [facility], [address], [place], [place] [address]    Report Status 06/01/2023 FINAL  Final  Urine Culture     Status: None   Collection Time: 05/29/23 10:24 AM   Specimen: Urine, Clean Catch  Result Value Ref Range Status   Specimen Description   Final    URINE, CLEAN CATCH Performed at [facility], [address], [place], [place] [address]    Special Requests   Final    NONE Performed at [facility], [address], [place], [place] [address]    Culture   Final    NO GROWTH Performed at [facility], [address] [place], [place] [address]    Report Status 05/30/2023 FINAL  Final    Time coordinating discharge: 45 minutes  Signed: Jojo Pehl  [facility] 06/01/2023, 2:35 PM

## 2023-06-03 ENCOUNTER — Other Ambulatory Visit (HOSPITAL_COMMUNITY): Payer: Self-pay

## 2023-06-04 ENCOUNTER — Other Ambulatory Visit (HOSPITAL_COMMUNITY): Payer: Self-pay

## 2023-06-05 ENCOUNTER — Ambulatory Visit: Admitting: Physician Assistant

## 2023-06-05 ENCOUNTER — Other Ambulatory Visit: Payer: Self-pay | Admitting: Family Medicine

## 2023-06-05 ENCOUNTER — Encounter: Payer: Self-pay | Admitting: Physician Assistant

## 2023-06-05 VITALS — BP 118/77 | HR 81 | Temp 98.7°F | Ht 67.0 in | Wt 173.0 lb

## 2023-06-05 DIAGNOSIS — K631 Perforation of intestine (nontraumatic): Secondary | ICD-10-CM | POA: Diagnosis not present

## 2023-06-05 DIAGNOSIS — N3001 Acute cystitis with hematuria: Secondary | ICD-10-CM

## 2023-06-05 DIAGNOSIS — M810 Age-related osteoporosis without current pathological fracture: Secondary | ICD-10-CM

## 2023-06-05 DIAGNOSIS — F02A3 Dementia in other diseases classified elsewhere, mild, with mood disturbance: Secondary | ICD-10-CM

## 2023-06-05 DIAGNOSIS — G300 Alzheimer's disease with early onset: Secondary | ICD-10-CM

## 2023-06-05 DIAGNOSIS — R300 Dysuria: Secondary | ICD-10-CM | POA: Diagnosis not present

## 2023-06-05 DIAGNOSIS — Z09 Encounter for follow-up examination after completed treatment for conditions other than malignant neoplasm: Secondary | ICD-10-CM | POA: Diagnosis not present

## 2023-06-05 DIAGNOSIS — G9341 Metabolic encephalopathy: Secondary | ICD-10-CM | POA: Diagnosis not present

## 2023-06-05 DIAGNOSIS — R413 Other amnesia: Secondary | ICD-10-CM

## 2023-06-05 DIAGNOSIS — Z006 Encounter for examination for normal comparison and control in clinical research program: Secondary | ICD-10-CM

## 2023-06-05 DIAGNOSIS — N390 Urinary tract infection, site not specified: Secondary | ICD-10-CM

## 2023-06-05 LAB — POCT URINALYSIS DIP (CLINITEK)
Bilirubin, UA: NEGATIVE
Glucose, UA: NEGATIVE mg/dL
Ketones, POC UA: NEGATIVE mg/dL
Leukocytes, UA: NEGATIVE
Nitrite, UA: NEGATIVE
POC PROTEIN,UA: NEGATIVE
Spec Grav, UA: 1.01 (ref 1.010–1.025)
Urobilinogen, UA: 0.2 U/dL
pH, UA: 6 (ref 5.0–8.0)

## 2023-06-05 NOTE — Progress Notes (Signed)
 poc

## 2023-06-05 NOTE — Patient Instructions (Addendum)
 Referral to neurology for memory.  Referral to urology.  Stop fosamax  and no anti-inflammatories. Only tylenol  for pain.

## 2023-06-05 NOTE — Progress Notes (Signed)
 Established Patient Office Visit  Subjective   Patient ID: Amanda [name], female    DOB: [birth year]  Age: 68 y.o. MRN: [id number]  Chief Complaint  Patient presents with   Hospitalization Follow-up    Hospital fup  after bowel perforation pt was seen in ed for abdominal pain and loose stool , ct scan showed bowel perforation, pt had leptoscopy done , would like wound check incision scars are sore, pt reports she still has loose stools    HPI Pt is a 68 yo female who presents with her husband for hospital follow up.   Pt was being evaluated in a study for alzheimer's. Lab work showed that she is positive for PTAL marker for alzheimer's.   During her work up she had abnormal UA.   Pt presented to hospital on 4/20 with severe abdominal pain and confusion. She was found to have bowel perforation due to anastomotic ulcer perforation after gastric bypass surgery. GRAMH patch was done by general surgery. She was placed on protonix  and carafate . She had IV abx in hospital.   UTI was treated with IV antiobiotics in hospital. Hx of recurrent UTI and on abx for prevention. Seeing Dr. McDermonet to manage recurrent UTI. Has not been seen recently. No recent intervention.   She was confused in hospital and seroquel  given to use at bedtime for sleep. Not used since got home.   Discharged on 06/01/2023.  Pt is doing better but still weak. Concerned about memory, frequent UTI's.   .. Active Ambulatory Problems    Diagnosis Date Noted   HYPERCHOLESTEROLEMIA 09/05/2006   Factor V deficiency (HCC) 09/05/2006   Essential hypertension 05/16/2007   Diverticulosis of colon 10/18/2006   Constipation 06/21/2009   FATTY LIVER DISEASE 09/12/2007   Arthropathy 05/16/2007   LEG CRAMPS 07/17/2009   Obesity 05/23/2010   Status post laparoscopic sleeve gastrectomy 03/04/2018   Bilateral primary osteoarthritis of knee 03/05/2018   Gout 03/05/2018   Chronic pain of both knees 03/05/2018   Hammer toe  of left foot 07/23/2018   Epigastric pain 07/23/2018   Onychomycosis 07/23/2018   OA (osteoarthritis) of knee 07/21/2019   Memory changes 07/12/2020   Right hip pain 07/12/2020   Lumbar spondylosis 07/12/2020   Numbness and tingling of right side of face 07/12/2020   Osteoporosis 07/12/2020   Pap smear abnormality of cervix/human papillomavirus (HPV) positive 07/12/2020   Groin pain, right 08/03/2020   OAB (overactive bladder) 03/07/2021   Mixed stress and urge urinary incontinence 03/07/2021   Urinary frequency 03/07/2021   Type 2 diabetes mellitus with hyperglycemia, without long-term current use of insulin  (HCC) 03/18/2021   Class 1 obesity due to excess calories with serious comorbidity and body mass index (BMI) of 30.0 to 30.9 in adult 07/13/2021   Dyslipidemia, goal LDL below 70 07/13/2021   Urinary incontinence without sensory awareness 02/13/2022   Post-menopausal 02/17/2022   Recurrent UTI 06/26/2022   Family history of Parkinson disease 06/26/2022   Gait abnormality 12/22/2022   Tremors of nervous system 12/25/2022   Frequent falls 12/25/2022   White matter abnormality on MRI of brain 01/01/2023   Atopic dermatitis 01/01/2023   Paresthesias 04/04/2023   CKD (chronic kidney disease) stage 3, GFR 30-59 ml/min (HCC) 04/04/2023   Bowel perforation (HCC) 05/27/2023   Acute metabolic encephalopathy 06/06/2023   Mild early onset Alzheimer's dementia with mood disturbance (HCC) 06/06/2023   Resolved Ambulatory Problems    Diagnosis Date Noted   Acute cystitis 07/17/2009  Sleep apnea 10/25/2007   Acute pain of right thigh 08/03/2020   Past Medical History:  Diagnosis Date   Allergy    Arthritis    Blood dyscrasia    Diabetes mellitus without complication (HCC)    Fatty liver 05/2006   Hip pain    Hypertension    Knee pain    Pneumonia       ROS See HPI.    Objective:     BP 118/77   Pulse 81   Temp 98.7 F (37.1 C) (Oral)   Ht 5\' 7"  (1.702 m)   Wt 173  lb (78.5 kg)   SpO2 99%   BMI 27.10 kg/m  BP Readings from Last 3 Encounters:  06/05/23 118/77  06/01/23 (!) 163/84  04/03/23 (!) 112/97   Wt Readings from Last 3 Encounters:  06/05/23 173 lb (78.5 kg)  05/31/23 188 lb 7.9 oz (85.5 kg)  04/03/23 180 lb 5 oz (81.8 kg)      Physical Exam Constitutional:      Appearance: She is ill-appearing.  HENT:     Head: Normocephalic.  Cardiovascular:     Rate and Rhythm: Normal rate and regular rhythm.  Pulmonary:     Effort: Pulmonary effort is normal.     Breath sounds: Normal breath sounds.  Abdominal:     General: There is no distension.     Palpations: Abdomen is soft.     Tenderness: There is abdominal tenderness. There is no right CVA tenderness or left CVA tenderness.     Comments: Tubing from abdomen with compression gauzed wrap.   Musculoskeletal:     Right lower leg: No edema.     Left lower leg: No edema.  Neurological:     General: No focal deficit present.     Mental Status: She is alert and oriented to person, place, and time.  Psychiatric:        Mood and Affect: Mood normal.      Results for orders placed or performed in visit on 06/05/23  Urine Culture   Specimen: Urine   Urine  Result Value Ref Range   Urine Culture, Routine Preliminary report (A)    Organism ID, Bacteria Gram negative rods (A)   POCT URINALYSIS DIP (CLINITEK)  Result Value Ref Range   Color, UA colorless (A) yellow   Clarity, UA clear clear   Glucose, UA negative negative mg/dL   Bilirubin, UA negative negative   Ketones, POC UA negative negative mg/dL   Spec Grav, UA 2.130 8.657 - 1.025   Blood, UA trace-intact (A) negative   pH, UA 6.0 5.0 - 8.0   POC PROTEIN,UA negative negative, trace   Urobilinogen, UA 0.2 0.2 or 1.0 E.U./dL   Nitrite, UA Negative Negative   Leukocytes, UA Negative Negative     Assessment & Plan:  .Amanda AasDerek "[name]" was seen today for hospitalization follow-up.  Diagnoses and all orders for this  visit:  Hospital discharge follow-up  Acute cystitis with hematuria -     POCT URINALYSIS DIP (CLINITEK) -     Urine Culture -     Ambulatory referral to Urology  Bowel perforation [facility])  Memory changes  Acute metabolic encephalopathy -     Ambulatory referral to Neurology  Mild early onset Alzheimer's dementia with mood disturbance (HCC) -     Ambulatory referral to Neurology  Recurrent UTI -     Ambulatory referral to Urology   Reviewed chart and hospital admission  Follow  up with General Surgery after perforation repair. She is doing well. No issues with tubing and drainage.  Continue protonix  and carafate .  Avoid NSAIDs. Tylenol  only for pain.  Stop fosamax . Will submit to get prolia approved for bone density.   Repeat UA with culture to make sure infection cleared.  Pt requested 2nd opinion for recurrent UTIs.  Tested positive PTAL marker in alzheimer study Ongoing memory loss and acute confusion in the hospital Follow up with neurology for further evaluation and medication consideration  Follow up if acute mental status changing.        Sadrac Zeoli, PA-C

## 2023-06-06 DIAGNOSIS — F02A3 Dementia in other diseases classified elsewhere, mild, with mood disturbance: Secondary | ICD-10-CM | POA: Insufficient documentation

## 2023-06-06 DIAGNOSIS — G9341 Metabolic encephalopathy: Secondary | ICD-10-CM | POA: Insufficient documentation

## 2023-06-07 ENCOUNTER — Inpatient Hospital Stay: Admitting: Medical-Surgical

## 2023-06-08 ENCOUNTER — Encounter: Payer: Self-pay | Admitting: Physician Assistant

## 2023-06-08 NOTE — Progress Notes (Signed)
 Some bacteria but very small colony count detected. We don't always treat with antibiotic with under 100,000 colony count. Are you symptomatic with urinary symptoms at all right now?

## 2023-06-09 ENCOUNTER — Encounter: Payer: Self-pay | Admitting: Physician Assistant

## 2023-06-10 ENCOUNTER — Other Ambulatory Visit (HOSPITAL_COMMUNITY): Payer: Self-pay

## 2023-06-11 ENCOUNTER — Other Ambulatory Visit (HOSPITAL_COMMUNITY): Payer: Self-pay

## 2023-06-11 MED ORDER — NITROFURANTOIN MACROCRYSTAL 50 MG PO CAPS
50.0000 mg | ORAL_CAPSULE | Freq: Every day | ORAL | 11 refills | Status: DC
Start: 2023-06-11 — End: 2023-07-09
  Filled 2023-06-11: qty 30, 30d supply, fill #0

## 2023-06-12 ENCOUNTER — Other Ambulatory Visit: Payer: Self-pay

## 2023-06-12 ENCOUNTER — Encounter: Payer: Self-pay | Admitting: Pharmacist

## 2023-06-12 LAB — URINE CULTURE

## 2023-06-12 NOTE — Progress Notes (Signed)
 Klebsiella detected. Small colony count. Please let us  know if you happened to start having urinary symptoms.

## 2023-06-13 ENCOUNTER — Telehealth: Payer: Self-pay

## 2023-06-13 MED ORDER — DENOSUMAB 60 MG/ML ~~LOC~~ SOSY
60.0000 mg | PREFILLED_SYRINGE | Freq: Once | SUBCUTANEOUS | Status: AC
  Administered 2023-06-27: 60 mg via SUBCUTANEOUS

## 2023-06-13 NOTE — Progress Notes (Signed)
 Sent the order for the Prolia. I will call the patient to schedule once the authorization is done.

## 2023-06-13 NOTE — Telephone Encounter (Signed)
 Prolia VOB initiated via [email or web address]  Next Prolia inj DUE: NEW START

## 2023-06-13 NOTE — Addendum Note (Signed)
 Addended by: Doretha Ganja on: 06/13/2023 03:30 PM   Modules accepted: Orders

## 2023-06-15 ENCOUNTER — Other Ambulatory Visit: Payer: Self-pay

## 2023-06-15 ENCOUNTER — Other Ambulatory Visit (HOSPITAL_COMMUNITY): Payer: Self-pay

## 2023-06-15 NOTE — Telephone Encounter (Signed)
 Pt ready for scheduling for PROLIA  on or after : 06/15/23  Option# 1: Buy/Bill (Office supplied medication)  Out-of-pocket cost due at time of clinic visit: $332  Number of injection/visits approved: ---  Primary: [employer] Prolia  co-insurance: 20% Admin fee co-insurance: 0%  Secondary: --- Prolia  co-insurance:  Admin fee co-insurance:   Medical Benefit Details: Date Benefits were checked: 06/15/23 Deductible: NO/ Coinsurance: 20%/ Admin Fee: 0%  Prior Auth: N/A PA# Expiration Date:   # of doses approved: ----------------------------------------------------------------------- Option# 2- Med Obtained from pharmacy:  Pharmacy benefit: Copay $250 (Paid to pharmacy) Admin Fee: 0% (Pay at clinic)  Prior Auth: N/A PA# Expiration Date:   # of doses approved:   If patient wants fill through the pharmacy benefit please send prescription to: [employer]/RX [employer]VANCE, and include estimated need by date in rx notes. Pharmacy will ship medication directly to the office.  Patient NOT eligible for Prolia  Copay Card. Copay Card can make patient's cost as little as $25. Link to apply: https://www.amgensupportplus.com/copay  ** This summary of benefits is an estimation of the patient's out-of-pocket cost. Exact cost may very based on individual plan coverage.

## 2023-06-18 ENCOUNTER — Other Ambulatory Visit (HOSPITAL_COMMUNITY): Payer: Self-pay

## 2023-06-19 ENCOUNTER — Other Ambulatory Visit (HOSPITAL_COMMUNITY): Payer: Self-pay

## 2023-06-19 ENCOUNTER — Telehealth: Payer: Self-pay

## 2023-06-19 DIAGNOSIS — M810 Age-related osteoporosis without current pathological fracture: Secondary | ICD-10-CM

## 2023-06-19 NOTE — Progress Notes (Signed)
 Left message for patient to call back to discuss payment options.

## 2023-06-19 NOTE — Telephone Encounter (Signed)
 Pt ready for scheduling for PROLIA  on or after : 06/15/23   Option# 1: Buy/Bill (Office supplied medication)   Out-of-pocket cost due at time of clinic visit: $332   Number of injection/visits approved: ---   Primary: [employer] Prolia  co-insurance: 20% Admin fee co-insurance: 0%   Secondary: --- Prolia  co-insurance:  Admin fee co-insurance:    Medical Benefit Details: Date Benefits were checked: 06/15/23 Deductible: NO/ Coinsurance: 20%/ Admin Fee: 0%   Prior Auth: N/A PA# Expiration Date:   # of doses approved: ----------------------------------------------------------------------- Option# 2- Med Obtained from pharmacy:   Pharmacy benefit: Copay $250 (Paid to pharmacy) Admin Fee: 0% (Pay at clinic)   Prior Auth: N/A PA# Expiration Date:   # of doses approved:     If patient wants fill through the pharmacy benefit please send prescription to: [employer], and include estimated need by date in rx notes. Pharmacy will ship medication directly to the office.   Patient NOT eligible for Prolia  Copay Card. Copay Card can make patient's cost as little as $25. Link to apply: https://www.amgensupportplus.com/copay   ** This summary of benefits is an estimation of the patient's out-of-pocket cost. Exact cost may very based on individual plan coverage.

## 2023-06-19 NOTE — Telephone Encounter (Signed)
 I called patient to ask if she was ok with the cost of the Prolia . She would rather not have to pay the high cost of this medication. She would like a cheaper medication.

## 2023-06-19 NOTE — Progress Notes (Signed)
 Patient doesn't want to pay the cost of Prolia .

## 2023-06-19 NOTE — Telephone Encounter (Signed)
 Left message for patient to return call.

## 2023-06-20 ENCOUNTER — Other Ambulatory Visit: Payer: Self-pay

## 2023-06-20 ENCOUNTER — Encounter (HOSPITAL_COMMUNITY): Payer: Self-pay

## 2023-06-20 ENCOUNTER — Other Ambulatory Visit (HOSPITAL_COMMUNITY): Payer: Self-pay

## 2023-06-20 MED ORDER — DENOSUMAB 60 MG/ML ~~LOC~~ SOSY
60.0000 mg | PREFILLED_SYRINGE | Freq: Once | SUBCUTANEOUS | 0 refills | Status: DC
  Filled 2023-06-20 – 2023-06-21 (×2): qty 1, 180d supply, fill #0

## 2023-06-20 NOTE — Telephone Encounter (Signed)
 Fosamax  or other orals in that class are options. Is she ok with staying on fosamax ?

## 2023-06-20 NOTE — Telephone Encounter (Signed)
 Yes she is good for prolia . Educate about making sure she is taking calcium  1300mg  while on prolia .

## 2023-06-20 NOTE — Telephone Encounter (Signed)
 Pt wants to the Prolia  injection. She states she miss understood the cost, I can schedule the appt if you want me to.

## 2023-06-20 NOTE — Telephone Encounter (Signed)
 Patient scheduled for the 21 st of May.

## 2023-06-20 NOTE — Telephone Encounter (Signed)
 Spoke with patient . She chose to go with pharmacy benefits.  Will contact [facility] to get this in process.

## 2023-06-20 NOTE — Progress Notes (Signed)
 She did call back stating she has agreed to the Prolia . Placed order to pharmacy and she has been scheduled.

## 2023-06-20 NOTE — Telephone Encounter (Signed)
 Left message for a return call

## 2023-06-20 NOTE — Telephone Encounter (Signed)
 Patient wanting to start Prolia  injection - is blood work from 05/31/23 sufficient or will she need to return for lab work before starting prolia ?

## 2023-06-20 NOTE — Telephone Encounter (Signed)
 Prolia  was sent to pharmacy. I called to schedule patient for the 21 st and left a message for a return call.

## 2023-06-20 NOTE — Telephone Encounter (Signed)
 I called to see if she wants Buy and Personal assistant. Left message for a return call.

## 2023-06-20 NOTE — Telephone Encounter (Signed)
 Called Tiffany at [facility] - awaiting a return call.

## 2023-06-20 NOTE — Progress Notes (Signed)
 Patient to be enrolled with [facility]. Routed to Tiffany.

## 2023-06-21 ENCOUNTER — Other Ambulatory Visit (HOSPITAL_COMMUNITY): Payer: Self-pay

## 2023-06-21 ENCOUNTER — Other Ambulatory Visit: Payer: Self-pay

## 2023-06-21 NOTE — Telephone Encounter (Signed)
 Spoke with Tiffany at [facility]. She has reached out to the patient regarding the prolia  cost  She will await her return call for processing before sending out -  Copay is $250

## 2023-06-21 NOTE — Progress Notes (Signed)
 Specialty Pharmacy Initial Fill Coordination Note  Amanda [name] is a 68 y.o. female contacted today regarding initial fill of specialty medication(s) Denosumab  (PROLIA )   Patient requested Courier to Provider Office   Delivery date: 06/25/23   Verified address: [facility]  [address] [place] [address]   Medication will be filled on 5/16.   Patient is aware of $250 copayment.

## 2023-06-21 NOTE — Progress Notes (Signed)
 Pharmacy Patient Advocate Encounter  Insurance verification completed.   The patient is insured through [facility] ADVANTAGE/RX ADVANCE   Ran test claim for Prolia. Co-pay is $250.   This test claim was processed through [facility]- copay amounts may vary at other pharmacies due to pharmacy/plan contracts, or as the patient moves through the different stages of their insurance plan.

## 2023-06-22 ENCOUNTER — Other Ambulatory Visit: Payer: Self-pay

## 2023-06-26 NOTE — Telephone Encounter (Signed)
 Patient scheduled for 06/27/23 for injection.

## 2023-06-27 ENCOUNTER — Ambulatory Visit (INDEPENDENT_AMBULATORY_CARE_PROVIDER_SITE_OTHER)

## 2023-06-27 ENCOUNTER — Other Ambulatory Visit

## 2023-06-27 ENCOUNTER — Other Ambulatory Visit: Payer: Self-pay

## 2023-06-27 VITALS — BP 127/75 | HR 74 | Ht 67.0 in

## 2023-06-27 DIAGNOSIS — M810 Age-related osteoporosis without current pathological fracture: Secondary | ICD-10-CM | POA: Diagnosis not present

## 2023-06-27 MED ORDER — DENOSUMAB 60 MG/ML ~~LOC~~ SOSY
60.0000 mg | PREFILLED_SYRINGE | Freq: Once | SUBCUTANEOUS | Status: DC
Start: 2023-06-27 — End: 2023-12-28

## 2023-06-27 NOTE — Progress Notes (Addendum)
   Established Patient Office Visit  Subjective   Patient ID: Amanda [name], female    DOB: 1955/12/11  Age: 68 y.o. MRN: [id number]  Chief Complaint  Patient presents with   age related osteoporosis without current pathological fract    Initial Prolia  injection nurse visit.     HPI  Age-related osteoporosis without current pathological fracture- Initial Prolia  injection as nurse visit. Patient has not had alendronate  within the past two weeks. She is currently taking Vit D nd calcium  daily. PATIENT PROLIA  IS PATIENT SUPPLIED.   ROS    Objective:     BP 127/75   Pulse 74   Ht 5\' 7"  (1.702 m)   SpO2 100%   BMI 27.10 kg/m    Physical Exam   No results found for any visits on 06/27/23.    The ASCVD Risk score (Arnett DK, et al., 2019) failed to calculate for the following reasons:   The valid total cholesterol range is 130 to 320 mg/dL    Assessment & Plan:  Admin initial injection of Prolia  60mg  - SQ right arm. Patient tolerated injection well without complications. Return in 60 days plus one day for next injection of Prolia  as nurse visit. PATIENT PROLIA  IS PATIENT SUPPLIED. Problem List Items Addressed This Visit       Musculoskeletal and Integument   Osteoporosis - Primary   Relevant Medications   denosumab  (PROLIA ) injection 60 mg (Start on 06/27/2023 10:30 AM)    Return in about 6 months (around 12/29/2023) for next prolia  injection as nurse visit. Dickie Found, LPN

## 2023-06-27 NOTE — Patient Instructions (Addendum)
 Return in 6 months and one day for next Prolia  injection as nurse visit. PATIENT SUPPLIED PROLIA 

## 2023-07-03 ENCOUNTER — Ambulatory Visit: Payer: PPO | Admitting: Physician Assistant

## 2023-07-06 ENCOUNTER — Encounter: Payer: Self-pay | Admitting: Physician Assistant

## 2023-07-06 ENCOUNTER — Other Ambulatory Visit (HOSPITAL_COMMUNITY): Payer: Self-pay

## 2023-07-06 ENCOUNTER — Other Ambulatory Visit: Payer: Self-pay

## 2023-07-06 ENCOUNTER — Encounter: Payer: Self-pay | Admitting: Pharmacist

## 2023-07-06 ENCOUNTER — Ambulatory Visit: Admitting: Physician Assistant

## 2023-07-06 VITALS — BP 148/78 | HR 78 | Ht 67.0 in | Wt 168.0 lb

## 2023-07-06 DIAGNOSIS — N1831 Chronic kidney disease, stage 3a: Secondary | ICD-10-CM

## 2023-07-06 DIAGNOSIS — E1122 Type 2 diabetes mellitus with diabetic chronic kidney disease: Secondary | ICD-10-CM

## 2023-07-06 DIAGNOSIS — N390 Urinary tract infection, site not specified: Secondary | ICD-10-CM | POA: Diagnosis not present

## 2023-07-06 DIAGNOSIS — M810 Age-related osteoporosis without current pathological fracture: Secondary | ICD-10-CM | POA: Diagnosis not present

## 2023-07-06 DIAGNOSIS — Z9884 Bariatric surgery status: Secondary | ICD-10-CM

## 2023-07-06 DIAGNOSIS — K631 Perforation of intestine (nontraumatic): Secondary | ICD-10-CM | POA: Diagnosis not present

## 2023-07-06 DIAGNOSIS — N3281 Overactive bladder: Secondary | ICD-10-CM | POA: Diagnosis not present

## 2023-07-06 DIAGNOSIS — E782 Mixed hyperlipidemia: Secondary | ICD-10-CM | POA: Diagnosis not present

## 2023-07-06 DIAGNOSIS — Z8711 Personal history of peptic ulcer disease: Secondary | ICD-10-CM | POA: Diagnosis not present

## 2023-07-06 DIAGNOSIS — N3001 Acute cystitis with hematuria: Secondary | ICD-10-CM

## 2023-07-06 DIAGNOSIS — N3942 Incontinence without sensory awareness: Secondary | ICD-10-CM

## 2023-07-06 DIAGNOSIS — E7800 Pure hypercholesterolemia, unspecified: Secondary | ICD-10-CM

## 2023-07-06 DIAGNOSIS — I10 Essential (primary) hypertension: Secondary | ICD-10-CM

## 2023-07-06 DIAGNOSIS — E1165 Type 2 diabetes mellitus with hyperglycemia: Secondary | ICD-10-CM

## 2023-07-06 LAB — POCT GLYCOSYLATED HEMOGLOBIN (HGB A1C): Hemoglobin A1C: 5.1 % (ref 4.0–5.6)

## 2023-07-06 MED ORDER — METFORMIN HCL 500 MG PO TABS
500.0000 mg | ORAL_TABLET | Freq: Two times a day (BID) | ORAL | 0 refills | Status: DC
Start: 2023-07-06 — End: 2023-10-05
  Filled 2023-07-06: qty 180, 90d supply, fill #0

## 2023-07-06 MED ORDER — DAPAGLIFLOZIN PROPANEDIOL 10 MG PO TABS
10.0000 mg | ORAL_TABLET | Freq: Every day | ORAL | 0 refills | Status: DC
Start: 2023-07-06 — End: 2023-10-05
  Filled 2023-07-06: qty 90, 90d supply, fill #0

## 2023-07-06 MED ORDER — PANTOPRAZOLE SODIUM 40 MG PO TBEC
40.0000 mg | DELAYED_RELEASE_TABLET | Freq: Two times a day (BID) | ORAL | 3 refills | Status: DC
Start: 2023-07-06 — End: 2023-12-18
  Filled 2023-07-06: qty 180, 90d supply, fill #0
  Filled 2023-10-08: qty 180, 90d supply, fill #1

## 2023-07-06 MED ORDER — SUCRALFATE 1 G PO TABS
ORAL_TABLET | ORAL | 1 refills | Status: DC
  Filled 2023-07-06: qty 180, 60d supply, fill #0

## 2023-07-06 MED ORDER — MIRABEGRON ER 50 MG PO TB24
50.0000 mg | ORAL_TABLET | Freq: Every day | ORAL | 1 refills | Status: DC
  Filled 2023-07-06: qty 90, 90d supply, fill #0
  Filled 2023-09-29 – 2023-10-01 (×2): qty 90, 90d supply, fill #1

## 2023-07-06 NOTE — Progress Notes (Signed)
 Established Patient Office Visit  Subjective   Patient ID: Amanda [name], female    DOB: 1955-08-04  Age: 68 y.o. MRN: [id number]  Chief Complaint  Patient presents with   Medical Management of Chronic Issues    Last A1c 5.8    HPI Pt is a 68 yo female who presents to the clinic for follow up.   She had bowel perforation due to marginal ulcer s/p bariatric surgery with acute metabolic encephalopathy 05/27/23. She is doing much better.   She has followed up with general surgery and doing well. She has been able to tolerate and advance her eating. She is taking protonix  but not carafate .   Her mental status is back to baseline with little to no concerns.   She has not seen neurology or urology yet.   Pt has hx of recurrent UTI's. She has ongoing urinary frequency and urgency. No abdominal pain, flank pain or dysuria today. No urine odor.  No fever, chills, nausea or vomiting.   Not seen urology yet.   She continues metformin  and farixga for DM management. She denies any hypoglycemic events. She denies any CP, palpitations, headaches. She is using her freestyle libre and staying in the green range.    .. Active Ambulatory Problems    Diagnosis Date Noted   HYPERCHOLESTEROLEMIA 09/05/2006   Factor V deficiency (HCC) 09/05/2006   Essential hypertension 05/16/2007   Diverticulosis of colon 10/18/2006   Constipation 06/21/2009   FATTY LIVER DISEASE 09/12/2007   Arthropathy 05/16/2007   LEG CRAMPS 07/17/2009   Obesity 05/23/2010   Status post laparoscopic sleeve gastrectomy 03/04/2018   Bilateral primary osteoarthritis of knee 03/05/2018   Gout 03/05/2018   Chronic pain of both knees 03/05/2018   Hammer toe of left foot 07/23/2018   Epigastric pain 07/23/2018   Onychomycosis 07/23/2018   OA (osteoarthritis) of knee 07/21/2019   Memory changes 07/12/2020   Right hip pain 07/12/2020   Lumbar spondylosis 07/12/2020   Numbness and tingling of right side of face  07/12/2020   Osteoporosis 07/12/2020   Pap smear abnormality of cervix/human papillomavirus (HPV) positive 07/12/2020   Groin pain, right 08/03/2020   OAB (overactive bladder) 03/07/2021   Mixed stress and urge urinary incontinence 03/07/2021   Urinary frequency 03/07/2021   Type 2 diabetes mellitus with hyperglycemia, without long-term current use of insulin  (HCC) 03/18/2021   Class 1 obesity due to excess calories with serious comorbidity and body mass index (BMI) of 30.0 to 30.9 in adult 07/13/2021   Dyslipidemia, goal LDL below 70 07/13/2021   Urinary incontinence without sensory awareness 02/13/2022   Post-menopausal 02/17/2022   Recurrent UTI 06/26/2022   Family history of Parkinson disease 06/26/2022   Gait abnormality 12/22/2022   Tremors of nervous system 12/25/2022   Frequent falls 12/25/2022   White matter abnormality on MRI of brain 01/01/2023   Atopic dermatitis 01/01/2023   Paresthesias 04/04/2023   CKD (chronic kidney disease) stage 3, GFR 30-59 ml/min (HCC) 04/04/2023   Bowel perforation (HCC) 05/27/2023   Acute metabolic encephalopathy 06/06/2023   Mild early onset Alzheimer's dementia with mood disturbance (HCC) 06/06/2023   Resolved Ambulatory Problems    Diagnosis Date Noted   Acute cystitis 07/17/2009   Sleep apnea 10/25/2007   Acute pain of right thigh 08/03/2020   Past Medical History:  Diagnosis Date   Allergy    Arthritis    Blood dyscrasia    Diabetes mellitus without complication (HCC)    Fatty liver 05/2006  Hip pain    Hypertension    Knee pain    Pneumonia         ROS See HPI.    Objective:      BP (!) 148/78   Pulse 78   Ht 5\' 7"  (1.702 m)   Wt 168 lb (76.2 kg)   SpO2 99%   BMI 26.31 kg/m  BP Readings from Last 3 Encounters:  07/06/23 (!) 148/78  06/27/23 127/75  06/05/23 118/77   Wt Readings from Last 3 Encounters:  07/06/23 168 lb (76.2 kg)  06/05/23 173 lb (78.5 kg)  05/31/23 188 lb 7.9 oz (85.5 kg)      Physical Exam Constitutional:      Appearance: Normal appearance.  HENT:     Head: Normocephalic.  Cardiovascular:     Rate and Rhythm: Normal rate and regular rhythm.  Pulmonary:     Effort: Pulmonary effort is normal.     Breath sounds: Normal breath sounds.  Abdominal:     General: Bowel sounds are normal. There is no distension.     Palpations: Abdomen is soft. There is no mass.     Tenderness: There is no abdominal tenderness. There is no right CVA tenderness, left CVA tenderness, guarding or rebound.     Hernia: No hernia is present.     Comments: Well healed laparoscopic incisions  Musculoskeletal:     Right lower leg: No edema.     Left lower leg: No edema.  Neurological:     General: No focal deficit present.     Mental Status: She is alert and oriented to person, place, and time.  Psychiatric:        Mood and Affect: Mood normal.      Results for orders placed or performed in visit on 07/06/23  POCT HgB A1C  Result Value Ref Range   Hemoglobin A1C 5.1 4.0 - 5.6 %   HbA1c POC (<> result, manual entry)     HbA1c, POC (prediabetic range)     HbA1c, POC (controlled diabetic range)         Assessment & Plan:  .Amanda AasZada "[name]" was seen today for medical management of chronic issues.  Diagnoses and all orders for this visit:  Recurrent UTI -     Urine Culture  Age-related osteoporosis without current pathological fracture  Type 2 diabetes mellitus with stage 3a chronic kidney disease, without long-term current use of insulin  (HCC) -     POCT HgB A1C -     dapagliflozin  propanediol (FARXIGA ) 10 MG TABS tablet; Take 1 tablet (10 mg total) by mouth daily. -     metFORMIN  (GLUCOPHAGE ) 500 MG tablet; Take 1 tablet (500 mg total) by mouth 2 (two) times daily with a meal.  Essential hypertension  Mixed hyperlipidemia  OAB (overactive bladder) -     mirabegron  ER (MYRBETRIQ ) 50 MG TB24 tablet; 1 tablet PO QD  Urinary incontinence without sensory  awareness -     mirabegron  ER (MYRBETRIQ ) 50 MG TB24 tablet; 1 tablet PO QD  Bowel perforation (HCC) -     pantoprazole  (PROTONIX ) 40 MG tablet; Take 1 tablet (40 mg total) by mouth 2 (two) times daily. -     sucralfate  (CARAFATE ) 1 g tablet; Take one tablet before each meal.  History of gastric ulcer -     pantoprazole  (PROTONIX ) 40 MG tablet; Take 1 tablet (40 mg total) by mouth 2 (two) times daily. -     sucralfate  (CARAFATE ) 1  g tablet; Take one tablet before each meal.  S/P bariatric surgery -     pantoprazole  (PROTONIX ) 40 MG tablet; Take 1 tablet (40 mg total) by mouth 2 (two) times daily. -     sucralfate  (CARAFATE ) 1 g tablet; Take one tablet before each meal.   Pt is not having abdominal pain but having constant urinary frequency and urgency Urine culture ordered today Start myrbetriq  for OAB symptoms Keep appt with urology  Mental status back to normal. Hold on neurology referral. Likely due to infection.   Continue on protonix  and carafate  for now due to hx of bariatric surgery and bowel perforation.   A1C looks wonderful!  Continue on metformin  and farxiga  Decrease metformin  to 500mg  twice a day.  BP not to goal, on cozaar  Start checking at home with goal of under 130/80 If not report into clinic 2 week BP check in office Needs eye exam Vaccines UTD Follow up in 3 months    Sandy Crumb, PA-C

## 2023-07-06 NOTE — Patient Instructions (Signed)
 Start myrbetriq  daily.  Make sure taking protonix  twice a day and carafate  before each meal.  Continue on farxiga .  Decrease metformin  to 500mg  twice a day.  Keep urology appt.

## 2023-07-08 NOTE — Progress Notes (Signed)
 Chief Complaint: No chief complaint on file.   History of Present Illness:  Amanda [name] is a 68 y.o. female who is seen in consultation from [place], Elvie Hammed, PA-C for evaluation of for recurrent urinary tract infections.  She has baseline urinary urgency and frequency, and does use pull-ups.  However, symptoms are worse when she has known UTIs.  Symptoms include dysuria, worsening frequency and urgency.  No gross hematuria.  She has a culture pending, growing gram-negative rods.  Last pathogen was a fairly sensitive Klebsiella.  She [facility] Jenn[facility] in [facility] past.  She had been on suppressive Macrobid .  Also, she had been put on estrogen cream by her PCP but did not use it long-term.  She denies any febrile UTIs.   Past Medical History:  Past Medical History:  Diagnosis Date   Allergy    seasonal   Arthritis    all over   Blood dyscrasia    Carrys trait for Leiden Factor five never had any issues . Yhe only reason she was tested was because her mother had it   Diabetes mellitus without complication (HCC)    Pre surgery - Gastric sx 2014 Roun-Y no diabetes since  no meds   Fatty liver 05/2006   by US    Gout    Hip pain    Hypertension    Knee pain    Obesity    Pneumonia    Recurrent UTI    Sleep apnea    had roux en y lost 140lbs no longer needs cpap    Past Surgical History:  Past Surgical History:  Procedure Laterality Date   APPENDECTOMY  1977   CESAREAN SECTION  1989   one   CHOLECYSTECTOMY  1977   DILATION AND CURETTAGE OF UTERUS  1987 & 1988   LAPAROSCOPY N/A 05/27/2023   Procedure: LAPAROSCOPY DIAGNOSTIC, LAPAROSCOPIC GRAHAM PATCH REPAIR, LAPARSCOPIC PRIMARY INCISIONAL HERNIA REPAIR;  Surgeon: Aldean Hummingbird, MD;  Location: [facility];  Service: General;  Laterality: N/A;   REDUCTION MAMMAPLASTY Bilateral 1985   ROUX-EN-Y GASTRIC BYPASS  2014   TOTAL KNEE ARTHROPLASTY Right 07/21/2019   Procedure: TOTAL KNEE  ARTHROPLASTY;  Surgeon: Liliane Rei, MD;  Location: [facility];  Service: Orthopedics;  Laterality: Right;     Allergies:  Allergies  Allergen Reactions   Fenofibrate Other (See Comments)    Constipation   Fosamax  [Alendronate ]     GI side effects   Ozempic  (0.25 Or 0.5 Mg-Dose) [Semaglutide (0.25 Or 0.5mg -Dos)]     nausea    Family History:  Family History  Problem Relation Age of Onset   Breast cancer Mother    Diabetes Mother    Hypertension Mother    Cancer Mother        breast- partial mastectomy- in her 67's   Heart attack Father    Hypertension Father    Arthritis Father    Gout Father    Parkinsonism Father    Parkinson's disease Father    Alcohol abuse Maternal Grandfather    Healthy Child    Colon cancer Neg Hx    Esophageal cancer Neg Hx    Stomach cancer Neg Hx    Rectal cancer Neg Hx     Social History:  Social History   Tobacco Use   Smoking status: Former    Current packs/day: 0.00    Types: Cigarettes    Start date: 1986    Quit date:  2001    Years since quitting: 24.4   Smokeless tobacco: Never  Vaping Use   Vaping status: Never Used  Substance Use Topics   Alcohol use: Yes    Comment: occasional  once a month   Drug use: No    Review of symptoms:  Constitutional:  Negative for unexplained weight loss, night sweats, fever, chills ENT:  Negative for nose bleeds, sinus pain, painful swallowing CV:  Negative for chest pain, shortness of breath, exercise intolerance, palpitations, loss of consciousness Resp:  Negative for cough, wheezing, shortness of breath GI:  Negative for nausea, vomiting, diarrhea, bloody stools GU:  Positives noted in HPI; otherwise negative for gross hematuria, dysuria, urinary incontinence Neuro:  Negative for seizures, poor balance, limb weakness, slurred speech Psych:  Negative for lack of energy, depression, anxiety Endocrine:  Negative for polydipsia, polyuria, symptoms of hypoglycemia (dizziness, hunger,  sweating) Hematologic:  Negative for anemia, purpura, petechia, prolonged or excessive bleeding, use of anticoagulants  Allergic:  Negative for difficulty breathing or choking as a result of exposure to anything; no shellfish allergy; no allergic response (rash/itch) to materials, foods  Physical exam: There were no vitals taken for this visit. GENERAL APPEARANCE:  Well appearing, well developed, well nourished, NAD HEENT: Atraumatic, Normocephalic. NECK: Normal appearance LUNGS: Normal inspiratory and expiratory excursion HEART: Regular Rate EXTREMITIES: Moves all extremities well.  Without clubbing, cyanosis, or edema. NEUROLOGIC:  Alert and oriented x 3, normal gait, CN II-XII grossly intact.  MENTAL STATUS:  Appropriate. SKIN:  Warm, dry and intact.    Results: No results found for this or any previous visit (from the past 24 hours).  I have reviewed prior patient's records from [facility]  I have reviewed referring/prior physicians records  I have reviewed urinalysis was prior cultures   Assessment: 1.  Recurrent urinary tract infections.  No worry about underlying issues, she does have a very low residual urine volume  2.  Vaginal atrophic changes, not on regular estrogen cream   Plan: 1.  Urine was cultured.  I will send in a prescription for Bactrim  2.  She will get on her Estrace  cream 2-3 nights a week on a regular basis.  I gave her instructions on how to apply  3.  I will start her on methenamine hippurate suppression following completion of her antibiotics  4.  I would recommend taking probiotics regularly, especially with all the antibiotic she has been on in the past  5.  I will see back in 3 months

## 2023-07-09 ENCOUNTER — Other Ambulatory Visit (HOSPITAL_COMMUNITY): Payer: Self-pay

## 2023-07-09 ENCOUNTER — Encounter: Payer: Self-pay | Admitting: Physician Assistant

## 2023-07-09 ENCOUNTER — Other Ambulatory Visit: Payer: Self-pay

## 2023-07-09 ENCOUNTER — Ambulatory Visit: Admitting: Urology

## 2023-07-09 VITALS — BP 124/85 | HR 78 | Ht 67.0 in | Wt 162.0 lb

## 2023-07-09 DIAGNOSIS — N390 Urinary tract infection, site not specified: Secondary | ICD-10-CM

## 2023-07-09 DIAGNOSIS — N952 Postmenopausal atrophic vaginitis: Secondary | ICD-10-CM | POA: Diagnosis not present

## 2023-07-09 DIAGNOSIS — Z87440 Personal history of urinary (tract) infections: Secondary | ICD-10-CM | POA: Diagnosis not present

## 2023-07-09 LAB — URINALYSIS, ROUTINE W REFLEX MICROSCOPIC
Bilirubin, UA: NEGATIVE
Ketones, UA: NEGATIVE
Nitrite, UA: POSITIVE — AB
Protein,UA: NEGATIVE
Specific Gravity, UA: 1.01 (ref 1.005–1.030)
Urobilinogen, Ur: 0.2 mg/dL (ref 0.2–1.0)
pH, UA: 5.5 (ref 5.0–7.5)

## 2023-07-09 LAB — MICROSCOPIC EXAMINATION: WBC, UA: >30 /HPF — AB (ref 0–5)

## 2023-07-09 LAB — BLADDER SCAN AMB NON-IMAGING: Scan Result: 13

## 2023-07-09 MED ORDER — SULFAMETHOXAZOLE-TRIMETHOPRIM 800-160 MG PO TABS
1.0000 | ORAL_TABLET | Freq: Two times a day (BID) | ORAL | 0 refills | Status: DC
Start: 2023-07-09 — End: 2023-09-12
  Filled 2023-07-09: qty 10, 5d supply, fill #0

## 2023-07-09 MED ORDER — METHENAMINE HIPPURATE 1 G PO TABS
1.0000 g | ORAL_TABLET | Freq: Two times a day (BID) | ORAL | 11 refills | Status: AC
  Filled 2023-07-09: qty 60, 30d supply, fill #0
  Filled 2023-10-05: qty 60, 30d supply, fill #1
  Filled 2023-11-12: qty 60, 30d supply, fill #2

## 2023-07-10 ENCOUNTER — Ambulatory Visit: Payer: Self-pay | Admitting: Physician Assistant

## 2023-07-10 ENCOUNTER — Encounter: Payer: Self-pay | Admitting: Physician Assistant

## 2023-07-10 NOTE — Progress Notes (Signed)
 Treated by Urology with bactrim.

## 2023-07-11 ENCOUNTER — Other Ambulatory Visit: Payer: Self-pay | Admitting: Physician Assistant

## 2023-07-11 ENCOUNTER — Other Ambulatory Visit (HOSPITAL_COMMUNITY): Payer: Self-pay

## 2023-07-11 DIAGNOSIS — N390 Urinary tract infection, site not specified: Secondary | ICD-10-CM

## 2023-07-11 LAB — URINE CULTURE

## 2023-07-11 MED ORDER — ESTRADIOL 0.1 MG/GM VA CREA
TOPICAL_CREAM | VAGINAL | 1 refills | Status: AC
  Filled 2023-07-11: qty 42.5, 100d supply, fill #0

## 2023-07-12 ENCOUNTER — Other Ambulatory Visit (HOSPITAL_COMMUNITY): Payer: Self-pay

## 2023-07-12 ENCOUNTER — Other Ambulatory Visit: Payer: Self-pay

## 2023-07-16 ENCOUNTER — Other Ambulatory Visit (HOSPITAL_COMMUNITY): Payer: Self-pay

## 2023-07-20 ENCOUNTER — Other Ambulatory Visit (HOSPITAL_COMMUNITY): Payer: Self-pay

## 2023-07-20 ENCOUNTER — Other Ambulatory Visit: Payer: Self-pay

## 2023-07-31 ENCOUNTER — Other Ambulatory Visit (HOSPITAL_COMMUNITY): Payer: Self-pay

## 2023-07-31 ENCOUNTER — Other Ambulatory Visit: Payer: Self-pay | Admitting: Physician Assistant

## 2023-07-31 DIAGNOSIS — E785 Hyperlipidemia, unspecified: Secondary | ICD-10-CM

## 2023-07-31 DIAGNOSIS — E7800 Pure hypercholesterolemia, unspecified: Secondary | ICD-10-CM

## 2023-07-31 DIAGNOSIS — I10 Essential (primary) hypertension: Secondary | ICD-10-CM

## 2023-08-01 ENCOUNTER — Other Ambulatory Visit: Payer: Self-pay

## 2023-08-01 ENCOUNTER — Other Ambulatory Visit (HOSPITAL_COMMUNITY): Payer: Self-pay

## 2023-08-06 ENCOUNTER — Other Ambulatory Visit (HOSPITAL_COMMUNITY): Payer: Self-pay

## 2023-08-07 ENCOUNTER — Other Ambulatory Visit: Payer: Self-pay

## 2023-08-07 MED ORDER — SPIRONOLACTONE 25 MG PO TABS
25.0000 mg | ORAL_TABLET | Freq: Every day | ORAL | 0 refills | Status: DC
  Filled 2023-08-07: qty 90, 90d supply, fill #0

## 2023-08-07 MED ORDER — ATORVASTATIN CALCIUM 40 MG PO TABS
40.0000 mg | ORAL_TABLET | Freq: Every day | ORAL | 3 refills | Status: DC
  Filled 2023-08-07: qty 90, 90d supply, fill #0
  Filled 2023-10-29: qty 90, 90d supply, fill #1

## 2023-08-23 ENCOUNTER — Encounter: Payer: Self-pay | Admitting: Physician Assistant

## 2023-09-11 NOTE — Progress Notes (Unsigned)
 Amanda Console, Amanda [name] [address] [place], [place]  [address] Phone: [phone]   Gastroenterology Consultation  Referring Provider:     Antoniette Vermell LITTIE [name] Primary Care Physician:  Amanda [name] Primary Gastroenterologist:  Amanda Console, Amanda [name] / Norleen Kiang, MD  Reason for Consultation:     Hospital follow-up; discuss EGD, Hx bariatric surgery with anastomotic ulcer and upper GI bleed        HPI:   Amanda [name] is a 68 y.o. y/o female referred for consultation & management  by Amanda Vermell LITTIE, Amanda [name].  Last saw Dr. Kiang in 2019.  Current symptoms:  Patient was hospitalized 05/27/2023 until 06/01/2023 for upper GI bleed, acute blood loss anemia, anastomotic ulcer perforation, history of Roux-en-Y gastric bypass 2013.  She presented to ED 05/27/2023 with acute severe abdominal pain.  Abdominal pelvic CT suggestive of intraperitoneal gas concerning for anastomotic dehiscence.  05/28/2023 she underwent laparoscopic Arlyss patch repair of perforated marginal ulcer by Dr. Tanda.  Started on Protonix  40 Mg twice daily and Carafate  1 g twice daily.  Initial hemoglobin 13.5 and trended down to 10.7.  She has not had repeat labs hemoglobin since 05/31/2023.  Is not currently on iron or B12.  Last labs 05/27/2023 also showed low potassium 3.1, otherwise normal BMP.  Not currently on potassium.  Needs repeat labs.    She saw Dr. Tanda, general surgeon, for follow-up office visit at [facility] 06/26/2023.  He recommended continue PPI and Carafate  for 6 weeks.  Avoid NSAIDs.  He recommended follow-up EGD in a few months to make sure ulcer had healed.  Current symptoms: Patient is feeling a lot better.  Stools are back to brown color.  She has not had any recent dark stools.  Denies any bright red rectal bleeding.  Typically has 2 or 3 loose stools per day.  She denies abdominal pain.  She continues Protonix  40 Mg twice daily and sucralfate  1 g twice daily.  She would like to decrease medication.   She is avoiding NSAIDs.  09/2017 last colonoscopy by Dr. Kiang: One small 4 mm benign colon mucosal polyp removed from transverse colon.  Left-sided diverticulosis.  Biopsies negative for microscopic colitis.  Excellent prep.  10-year repeat (due 09/2027).  2008 colonoscopy: Normal.  PMH: Hypertension, diabetes type 2, obesity, sleep apnea, factor V Leiden deficiency, remote cholecystectomy and appendectomy.  History of fatty liver and Roux-en-Y gastric bypass (2013).  Past Medical History:  Diagnosis Date   Allergy    seasonal   Arthritis    all over   Blood dyscrasia    Carrys trait for Leiden Factor five never had any issues . Yhe only reason she was tested was because her mother had it   Diabetes mellitus without complication (HCC)    Pre surgery - Gastric sx 2014 Roun-Y no diabetes since  no meds   Fatty liver 05/2006   by US    Gout    Hip pain    Hypertension    Knee pain    Obesity    Pneumonia    Recurrent UTI    Sleep apnea    had roux en y lost 140lbs no longer needs cpap    Past Surgical History:  Procedure Laterality Date   APPENDECTOMY  1977   CESAREAN SECTION  1989   one   CHOLECYSTECTOMY  1977   DILATION AND CURETTAGE OF UTERUS  1987 & 1988   LAPAROSCOPY N/A 05/27/2023  Procedure: LAPAROSCOPY DIAGNOSTIC, LAPAROSCOPIC GRAHAM PATCH REPAIR, LAPARSCOPIC PRIMARY INCISIONAL HERNIA REPAIR;  Surgeon: Tanda Locus, MD;  Location: [facility];  Service: General;  Laterality: N/A;   REDUCTION MAMMAPLASTY Bilateral 1985   ROUX-EN-Y GASTRIC BYPASS  2014   TOTAL KNEE ARTHROPLASTY Right 07/21/2019   Procedure: TOTAL KNEE ARTHROPLASTY;  Surgeon: Melodi Lerner, MD;  Location: [facility];  Service: Orthopedics;  Laterality: Right;     Prior to Admission medications   Medication Sig Start Date End Date Taking? Authorizing Provider  Accu-Chek Softclix Lancets lancets Use up to four times daily as directed 07/08/21  Yes   acetaminophen  (TYLENOL ) 500 MG tablet Take 500 mg by  mouth every 6 (six) hours as needed for moderate pain (pain score 4-6).   Yes [provider]  allopurinol  (ZYLOPRIM ) 100 MG tablet Take 1 tablet (100 mg total) by mouth daily. Needs appt 02/14/23  Yes Breeback, Jade L, Amanda [name]  AMBULATORY NON FORMULARY MEDICATION Barislend 1 tablet by mouth once a day   Yes [provider]  atorvastatin  (LIPITOR) 40 MG tablet Take 1 tablet (40 mg total) by mouth daily. 08/07/23  Yes Breeback, Jade L, Amanda [name]  Biotin 89999 MCG TABS Take 1 tablet by mouth daily.   Yes [provider]  blood glucose meter kit and supplies Use up to four times daily as directed 07/08/21  Yes Breeback, Jade L, Amanda [name]  calcium  citrate (CALCITRATE - DOSED IN MG ELEMENTAL CALCIUM ) 950 (200 Ca) MG tablet Take 200 mg of elemental calcium  by mouth daily.   Yes [provider]  Continuous Glucose Sensor (FREESTYLE LIBRE 14 DAY SENSOR) MISC Apply to upper deltoid every 14 days. Use reader to determine blood sugars 05/01/23  Yes Breeback, Jade L, Amanda [name]  dapagliflozin  propanediol (FARXIGA ) 10 MG TABS tablet Take 1 tablet (10 mg total) by mouth daily. 07/06/23  Yes Breeback, Jade L, Amanda [name]  estradiol  (ESTRACE  VAGINAL) 0.1 MG/GM vaginal cream Apply pea-sized amount over urethral opening daily. 07/11/23  Yes Breeback, Jade L, Amanda [name]  fexofenadine (ALLEGRA) 180 MG tablet Take 180 mg by mouth daily as needed for allergies.   Yes [provider]  fluticasone (FLONASE) 50 MCG/ACT nasal spray Place 1 spray into both nostrils daily as needed for allergies.   Yes [provider]  glucose blood (ACCU-CHEK GUIDE) test strip Use up to four times daily as directed 07/08/21  Yes   losartan  (COZAAR ) 25 MG tablet Take 1 tablet (25 mg total) by mouth daily. 04/25/23  Yes Breeback, Jade L, Amanda [name]  metFORMIN  (GLUCOPHAGE ) 500 MG tablet Take 1 tablet (500 mg total) by mouth 2 (two) times daily with a meal. 07/06/23  Yes Breeback, Jade L, Amanda [name]  methocarbamol  (ROBAXIN ) 500 MG tablet Take 1 tablet  (500 mg total) by mouth 4 (four) times daily. Patient taking differently: Take 500 mg by mouth as needed for muscle spasms (on hand). 10/12/22  Yes Breeback, Jade L, Amanda [name]  mirabegron  ER (MYRBETRIQ ) 50 MG TB24 tablet Take 1 tablet (50 mg total) by mouth daily. 07/06/23  Yes Breeback, Jade L, Amanda [name]  Multiple Vitamin (MULTI-VITAMIN) tablet Take 1 tablet by mouth in the morning and at bedtime.   Yes [provider]  pantoprazole  (PROTONIX ) 40 MG tablet Take 1 tablet (40 mg total) by mouth 2 (two) times daily. 07/06/23 10/04/23 Yes Breeback, Jade L, Amanda [name]  spironolactone  (ALDACTONE ) 25 MG tablet Take 1 tablet (25 mg total) by mouth daily. 08/07/23  Yes Breeback, Jade L, Amanda [name]  sucralfate  (CARAFATE ) 1 g tablet  Take one tablet before each meal. Patient taking differently: Take 1 g by mouth 2 (two) times daily. 07/06/23  Yes Breeback, Jade L, Amanda [name]  gabapentin  (NEURONTIN ) 300 MG capsule Take 1-2 capsules (300-600 mg total) by mouth 3 (three) times daily. Patient not taking: Reported on 09/12/2023 01/26/23   Amanda Vermell CROME, Amanda [name]  methenamine  (HIPREX ) 1 g tablet Take 1 tablet (1 g total) by mouth 2 (two) times daily with a meal. Start after the sulfa  tablet has been completed Patient not taking: Reported on 09/12/2023 07/09/23   Matilda Senior, MD     Family History  Problem Relation Age of Onset   Factor V Leiden deficiency Mother    Diabetes Mother    Hypertension Mother    Cancer Mother        breast- partial mastectomy- in her 34's   Heart attack Father    Hypertension Father    Arthritis Father    Gout Father    Parkinson's disease Father    Heart attack Maternal Grandmother    Alcohol abuse Maternal Grandfather    Cancer Maternal Grandfather        type unknown   Colon cancer Neg Hx    Esophageal cancer Neg Hx    Stomach cancer Neg Hx    Rectal cancer Neg Hx      Social History   Tobacco Use   Smoking status: Former    Current packs/day: 0.00    Types: Cigarettes    Start date:  67    Quit date: 2001    Years since quitting: 24.6   Smokeless tobacco: Never  Vaping Use   Vaping status: Never Used  Substance Use Topics   Alcohol use: Yes    Comment: occasional  once a month   Drug use: No    Allergies as of 09/12/2023 - Review Complete 09/12/2023  Allergen Reaction Noted   Fenofibrate Other (See Comments) 05/23/2010   Fosamax  [alendronate ]  06/09/2023   Ozempic  (0.25 or 0.5 mg-dose) [semaglutide (0.25 or 0.5mg -dos)]  02/17/2022   Sulfonamide derivatives Dermatitis and Other (See Comments) 11/21/2010    Review of Systems:    All systems reviewed and negative except where noted in HPI.   Physical Exam:  BP 134/80 (BP Location: Left Arm, Patient Position: Sitting, Cuff Size: Large)   Pulse 80   Ht 5' 6.5 (1.689 m) Comment: height measured without shoes  Wt 159 lb 4 oz (72.2 kg)   BMI 25.32 kg/m  No LMP recorded. Patient is postmenopausal.  General:   Alert,  Well-developed, well-nourished, pleasant and cooperative in NAD Lungs:  Respirations even and unlabored.  Clear throughout to auscultation.   No wheezes, crackles, or rhonchi. No acute distress. Heart:  Regular rate and rhythm; no murmurs, clicks, rubs, or gallops. Abdomen:  Normal bowel sounds.  No bruits.  Soft, and non-distended without masses, hepatosplenomegaly or hernias noted.  No Tenderness.  No guarding or rebound tenderness.    Neurologic:  Alert and oriented x3;  grossly normal neurologically. Psych:  Alert and cooperative. Normal mood and affect.  Imaging Studies: No results found.  Labs: CBC    Component Value Date/Time   WBC 6.3 09/12/2023 0951   RBC 3.92 09/12/2023 0951   HGB 12.7 09/12/2023 0951   HGB 12.0 01/01/2023 0921   HCT 38.4 09/12/2023 0951   HCT 38.0 01/01/2023 0921   PLT 149.0 (L) 09/12/2023 0951   PLT 148 (L) 01/01/2023 0921   MCV 97.8 09/12/2023 0951  MCV 102 (H) 01/01/2023 0921    CMP     Component Value Date/Time   NA 144 09/12/2023 0951   NA 148  (H) 04/03/2023 1041   K 3.3 (L) 09/12/2023 0951   CL 105 09/12/2023 0951   CO2 31 09/12/2023 0951   GLUCOSE 103 (H) 09/12/2023 0951   BUN 13 09/12/2023 0951   BUN 12 04/03/2023 1041   CREATININE 0.77 09/12/2023 0951   CREATININE 0.93 02/13/2022 1032   CALCIUM  8.1 (L) 09/12/2023 0951   PROT 6.6 09/12/2023 0951   PROT 6.5 04/03/2023 1041   ALBUMIN 3.7 09/12/2023 0951   ALBUMIN 4.1 04/03/2023 1041   AST 32 09/12/2023 0951   ALT 23 09/12/2023 0951   ALKPHOS 53 09/12/2023 0951   BILITOT 1.1 09/12/2023 0951   BILITOT 0.5 04/03/2023 1041   GFRNONAA >60 05/31/2023 0500   GFRNONAA 61 02/12/2020 1031   GFRAA 71 02/12/2020 1031    Assessment and Plan:   JORGE AMPARO is a 68 y.o. y/o female has been referred for hospital follow-up of:  Anastomotic Marginal Ulcer from Roux-en-Y gastric bypass.  S/P emergent laparoscopic Gram patch repair of perforated marginal ulcer by Dr. Tanda 05/28/23.  Currently asymptomatic. Acute blood loss anemia Hx Gastric Bypass in 2013 Hypokalemia  Plan: - Labs: CBC, CMP, iron panel, B12, folate, magnesium, vitamin D  - Stop Sucralfate  - Decrease Pantoprazole  to 40mg  once daily dose indefinitely. - Avoid NSAIDs indefinitely. - Scheduling EGD to verify healing of anastomotic ulcer I discussed risks of EGD with patient to include risk of bleeding, perforation, and risk of sedation.  Patient expressed understanding and agrees to proceed with EGD.   Follow up 4 weeks after EGD with TG  Amanda Console, Amanda [name]

## 2023-09-12 ENCOUNTER — Other Ambulatory Visit (INDEPENDENT_AMBULATORY_CARE_PROVIDER_SITE_OTHER)

## 2023-09-12 ENCOUNTER — Encounter: Payer: Self-pay | Admitting: Physician Assistant

## 2023-09-12 ENCOUNTER — Ambulatory Visit: Admitting: Physician Assistant

## 2023-09-12 VITALS — BP 134/80 | HR 80 | Ht 66.5 in | Wt 159.2 lb

## 2023-09-12 DIAGNOSIS — D649 Anemia, unspecified: Secondary | ICD-10-CM

## 2023-09-12 DIAGNOSIS — Z9884 Bariatric surgery status: Secondary | ICD-10-CM

## 2023-09-12 DIAGNOSIS — F1090 Alcohol use, unspecified, uncomplicated: Secondary | ICD-10-CM

## 2023-09-12 DIAGNOSIS — D62 Acute posthemorrhagic anemia: Secondary | ICD-10-CM

## 2023-09-12 DIAGNOSIS — D500 Iron deficiency anemia secondary to blood loss (chronic): Secondary | ICD-10-CM | POA: Diagnosis not present

## 2023-09-12 DIAGNOSIS — K279 Peptic ulcer, site unspecified, unspecified as acute or chronic, without hemorrhage or perforation: Secondary | ICD-10-CM

## 2023-09-12 DIAGNOSIS — K289 Gastrojejunal ulcer, unspecified as acute or chronic, without hemorrhage or perforation: Secondary | ICD-10-CM

## 2023-09-12 DIAGNOSIS — E876 Hypokalemia: Secondary | ICD-10-CM | POA: Diagnosis not present

## 2023-09-12 LAB — COMPREHENSIVE METABOLIC PANEL WITH GFR
ALT: 23 U/L (ref 0–35)
AST: 32 U/L (ref 0–37)
Albumin: 3.7 g/dL (ref 3.5–5.2)
Alkaline Phosphatase: 53 U/L (ref 39–117)
BUN: 13 mg/dL (ref 6–23)
CO2: 31 meq/L (ref 19–32)
Calcium: 8.1 mg/dL — ABNORMAL LOW (ref 8.4–10.5)
Chloride: 105 meq/L (ref 96–112)
Creatinine, Ser: 0.77 mg/dL (ref 0.40–1.20)
GFR: 79.69 mL/min (ref >60.00)
Glucose, Bld: 103 mg/dL — ABNORMAL HIGH (ref 70–99)
Potassium: 3.3 meq/L — ABNORMAL LOW (ref 3.5–5.1)
Sodium: 144 meq/L (ref 135–145)
Total Bilirubin: 1.1 mg/dL (ref 0.2–1.2)
Total Protein: 6.6 g/dL (ref 6.0–8.3)

## 2023-09-12 LAB — CBC WITH DIFFERENTIAL/PLATELET
Basophils Absolute: 0 K/uL (ref 0.0–0.1)
Basophils Relative: 0.5 % (ref 0.0–3.0)
Eosinophils Absolute: 0.1 K/uL (ref 0.0–0.7)
Eosinophils Relative: 1 % (ref 0.0–5.0)
HCT: 38.4 % (ref 36.0–46.0)
Hemoglobin: 12.7 g/dL (ref 12.0–15.0)
Lymphocytes Relative: 42.1 % (ref 12.0–46.0)
Lymphs Abs: 2.7 K/uL (ref 0.7–4.0)
MCHC: 33.1 g/dL (ref 30.0–36.0)
MCV: 97.8 fl (ref 78.0–100.0)
Monocytes Absolute: 0.5 K/uL (ref 0.1–1.0)
Monocytes Relative: 7.5 % (ref 3.0–12.0)
Neutro Abs: 3.1 K/uL (ref 1.4–7.7)
Neutrophils Relative %: 48.9 % (ref 43.0–77.0)
Platelets: 149 K/uL — ABNORMAL LOW (ref 150.0–400.0)
RBC: 3.92 Mil/uL (ref 3.87–5.11)
RDW: 15.6 % — ABNORMAL HIGH (ref 11.5–15.5)
WBC: 6.3 K/uL (ref 4.0–10.5)

## 2023-09-12 LAB — FOLATE: Folate: 19.3 ng/mL (ref >5.9)

## 2023-09-12 LAB — MAGNESIUM: Magnesium: 1.4 mg/dL — ABNORMAL LOW (ref 1.5–2.5)

## 2023-09-12 LAB — VITAMIN D 25 HYDROXY (VIT D DEFICIENCY, FRACTURES): VITD: 64.45 ng/mL (ref 30.00–100.00)

## 2023-09-12 LAB — VITAMIN B12: Vitamin B-12: 655 pg/mL (ref 211–911)

## 2023-09-12 NOTE — Patient Instructions (Signed)
 Your provider has requested that you go to the basement level for lab work before leaving today. Press B on the elevator. The lab is located at the first door on the left as you exit the elevator.  We have sent the following medications to your pharmacy for you to pick up at your convenience: Pantoprazole  40 mg once daily   STOP Sucralfate   You have been scheduled for an Endoscopy. Please follow written instructions given to you at your visit today.  If you use inhalers (even only as needed), please bring them with you on the day of your procedure.  If you take any of the following medications, they will need to be adjusted prior to your procedure:   DO NOT TAKE 7 DAYS PRIOR TO TEST- Trulicity (dulaglutide) Ozempic , Wegovy  (semaglutide ) Mounjaro  (tirzepatide ) Bydureon Bcise (exanatide extended release)  DO NOT TAKE 1 DAY PRIOR TO YOUR TEST Rybelsus  (semaglutide ) Adlyxin (lixisenatide) Victoza (liraglutide) Byetta (exanatide) ___________________________________________________________________________  Please follow up sooner if symptoms increase or worsen  Due to recent changes in healthcare laws, you may see the results of your imaging and laboratory studies on MyChart before your provider has had a chance to review them.  We understand that in some cases there may be results that are confusing or concerning to you. Not all laboratory results come back in the same time frame and the provider may be waiting for multiple results in order to interpret others.  Please give us  48 hours in order for your provider to thoroughly review all the results before contacting the office for clarification of your results.   Thank you for trusting me with your gastrointestinal care!   Ellouise Console, PA-C _______________________________________________________  If your blood pressure at your visit was 140/90 or greater, please contact your primary care physician to follow up on  this.  _______________________________________________________  If you are age 46 or older, your body mass index should be between 23-30. Your Body mass index is 25.32 kg/m. If this is out of the aforementioned range listed, please consider follow up with your Primary Care Provider.  If you are age 75 or younger, your body mass index should be between 19-25. Your Body mass index is 25.32 kg/m. If this is out of the aformentioned range listed, please consider follow up with your Primary Care Provider.   ________________________________________________________  The [facility] providers would like to encourage you to use MYCHART to communicate with providers for non-urgent requests or questions.  Due to long hold times on the telephone, sending your provider a message by [facility] may be a faster and more efficient way to get a response.  Please allow 48 business hours for a response.  Please remember that this is for non-urgent requests.  _______________________________________________________

## 2023-09-12 NOTE — Progress Notes (Signed)
 Noted

## 2023-09-13 LAB — IRON,TIBC AND FERRITIN PANEL
%SAT: 45 % (ref 16–45)
Ferritin: 323 ng/mL — ABNORMAL HIGH (ref 16–288)
Iron: 84 ug/dL (ref 45–160)
TIBC: 188 ug/dL — ABNORMAL LOW (ref 250–450)

## 2023-09-14 ENCOUNTER — Other Ambulatory Visit: Payer: Self-pay

## 2023-09-14 ENCOUNTER — Ambulatory Visit: Payer: Self-pay | Admitting: Physician Assistant

## 2023-09-14 ENCOUNTER — Other Ambulatory Visit (HOSPITAL_COMMUNITY): Payer: Self-pay

## 2023-09-14 DIAGNOSIS — E876 Hypokalemia: Secondary | ICD-10-CM

## 2023-09-14 MED ORDER — POTASSIUM CHLORIDE ER 20 MEQ PO TBCR
20.0000 meq | EXTENDED_RELEASE_TABLET | Freq: Every day | ORAL | 0 refills | Status: AC
  Filled 2023-09-14: qty 10, 10d supply, fill #0

## 2023-09-19 NOTE — Telephone Encounter (Signed)
 Called patient to remind her that she is due for repeat labs (potassium) on 09/21/23. Patient states that her potassium supplement did not come in until 2 days ago. Therefore, she will come next Wednesday to have potassium levels redrawn.

## 2023-09-26 ENCOUNTER — Encounter: Payer: Self-pay | Admitting: Gastroenterology

## 2023-09-26 ENCOUNTER — Ambulatory Visit: Admitting: Gastroenterology

## 2023-09-26 VITALS — BP 177/99 | HR 72 | Temp 96.0°F | Resp 18 | Ht 66.0 in | Wt 159.0 lb

## 2023-09-26 DIAGNOSIS — Z9884 Bariatric surgery status: Secondary | ICD-10-CM

## 2023-09-26 DIAGNOSIS — K279 Peptic ulcer, site unspecified, unspecified as acute or chronic, without hemorrhage or perforation: Secondary | ICD-10-CM

## 2023-09-26 DIAGNOSIS — K319 Disease of stomach and duodenum, unspecified: Secondary | ICD-10-CM | POA: Diagnosis not present

## 2023-09-26 DIAGNOSIS — T182XXA Foreign body in stomach, initial encounter: Secondary | ICD-10-CM | POA: Diagnosis not present

## 2023-09-26 MED ORDER — SODIUM CHLORIDE 0.9 % IV SOLN: 500.0000 mL | Freq: Once | INTRAVENOUS | Status: DC

## 2023-09-26 NOTE — Patient Instructions (Signed)
 Resume previous diet  Continue present medications including Protonix   Await pathology results  Avoid NSAIDs YOU HAD AN ENDOSCOPIC PROCEDURE TODAY AT THE [facility]:   Refer to the procedure report that was given to you for any specific questions about what was found during the examination.  If the procedure report does not answer your questions, please call your gastroenterologist to clarify.  If you requested that your care partner not be given the details of your procedure findings, then the procedure report has been included in a sealed envelope for you to review at your convenience later.  YOU SHOULD EXPECT: Some feelings of bloating in the abdomen. Passage of more gas than usual.  Walking can help get rid of the air that was put into your GI tract during the procedure and reduce the bloating. If you had a lower endoscopy (such as a colonoscopy or flexible sigmoidoscopy) you may notice spotting of blood in your stool or on the toilet paper. If you underwent a bowel prep for your procedure, you may not have a normal bowel movement for a few days.  Please Note:  You might notice some irritation and congestion in your nose or some drainage.  This is from the oxygen used during your procedure.  There is no need for concern and it should clear up in a day or so.  SYMPTOMS TO REPORT IMMEDIATELY: Following upper endoscopy (EGD)  Vomiting of blood or coffee ground material  New chest pain or pain under the shoulder blades  Painful or persistently difficult swallowing  New shortness of breath  Fever of 100F or higher  Black, tarry-looking stools  For urgent or emergent issues, a gastroenterologist can be reached at any hour by calling [phone]. Do not use MyChart messaging for urgent concerns.   DIET:  We do recommend a small meal at first, but then you may proceed to your regular diet.  Drink plenty of fluids but you should avoid alcoholic beverages for 24 hours.  ACTIVITY:   You should plan to take it easy for the rest of today and you should NOT DRIVE or use heavy machinery until tomorrow (because of the sedation medicines used during the test).    FOLLOW UP: Our staff will call the number listed on your records the next business day following your procedure.  We will call around 7:15- 8:00 am to check on you and address any questions or concerns that you may have regarding the information given to you following your procedure. If we do not reach you, we will leave a message.     If any biopsies were taken you will be contacted by phone or by letter within the next 1-3 weeks.  Please call us  at [phone] if you have not heard about the biopsies in 3 weeks.   SIGNATURES/CONFIDENTIALITY: You and/or your care partner have signed paperwork which will be entered into your electronic medical record.  These signatures attest to the fact that that the information above on your After Visit Summary has been reviewed and is understood.  Full responsibility of the confidentiality of this discharge information lies with you and/or your care-partner.

## 2023-09-26 NOTE — Progress Notes (Signed)
 Called to room to assist during endoscopic procedure.  Patient ID and intended procedure confirmed with present staff. Received instructions for my participation in the procedure from the performing physician.

## 2023-09-26 NOTE — Progress Notes (Signed)
 1214 Robinul 0.1 mg IV given due large amount of secretions upon assessment.  MD made aware, vss

## 2023-09-26 NOTE — Progress Notes (Signed)
 Pt's states no medical or surgical changes since previsit or office visit.

## 2023-09-26 NOTE — Op Note (Signed)
 [facility] Patient Name: Amanda [name] Procedure Date: 09/26/2023 12:01 PM MRN: [id number] Endoscopist: Elspeth P. Leigh , MD, [id number] Age: 68 Referring MD:  Date of Birth: 1955-10-25 Gender: Female Account #: [id number] Procedure:                Upper GI endoscopy Indications:              history of Roux-en-Y with marginal ulcer that led                            to perforation / surgical repair in April 2025,                            here for follow up EGD, ensure mucosal healing. Medicines:                Monitored Anesthesia Care Procedure:                Pre-Anesthesia Assessment:                           - Prior to the procedure, a History and Physical                            was performed, and patient medications and                            allergies were reviewed. The patient's tolerance of                            previous anesthesia was also reviewed. The risks                            and benefits of the procedure and the sedation                            options and risks were discussed with the patient.                            All questions were answered, and informed consent                            was obtained. Prior Anticoagulants: The patient has                            taken no anticoagulant or antiplatelet agents. ASA                            Grade Assessment: II - A patient with mild systemic                            disease. After reviewing the risks and benefits,                            the patient was deemed in satisfactory condition to  undergo the procedure.                           After obtaining informed consent, the endoscope was                            passed under direct vision. Throughout the                            procedure, the patient's blood pressure, pulse, and                            oxygen saturations were monitored continuously. The                             GIF [id number] #[id number] was introduced through the                            mouth, and advanced to the proximal jejunum. The                            upper GI endoscopy was accomplished without                            difficulty. The patient tolerated the procedure                            well. Scope In: Scope Out: Findings:                 The Z-line was regular.                           The exam of the esophagus was otherwise normal.                           Evidence of a Roux-en-Y gastrojejunostomy was                            found. The gastrojejunal anastomosis was                            characterized by healthy appearing mucosa. There                            were retained sutures that were deep in the mucosa,                            these were not removed today. Surgical anastomosis                            had no ulcerations, was healthy appearing.                           The exam of the gastric pouch was otherwise normal.  Due to restricted proximal pouch, retroflexed views                            not obtained there. The gastric pouch was larger                            than expected.                           Biopsies were taken with a cold forceps for                            Helicobacter pylori testing.                           The examined small bowel limb and blind pouch was                            normal. Complications:            No immediate complications. Estimated blood loss:                            Minimal. Estimated Blood Loss:     Estimated blood loss was minimal. Impression:               - Z-line regular.                           - Roux-en-Y gastrojejunostomy with gastrojejunal                            anastomosis characterized by healthy appearing                            mucosa. Residual sutures noted, no ulcers                            appreciated.                           - Normal  gastric pouch - biopsied to rule out H                            pylori                           - Normal examined small bowel limb. Recommendation:           - Patient has a contact number available for                            emergencies. The signs and symptoms of potential                            delayed complications were discussed with the  patient. Return to normal activities tomorrow.                            Written discharge instructions were provided to the                            patient.                           - Resume previous diet.                           - Continue present medications.                           - Continue protonix .                           - Await pathology results.                           - Avoid NSAIDs Elspeth SQUIBB. Genessa Beman, MD 09/26/2023 12:39:08 PM This report has been signed electronically.

## 2023-09-26 NOTE — Progress Notes (Signed)
 Report given to PACU, vss

## 2023-09-26 NOTE — Progress Notes (Signed)
 History and Physical Interval Note: Seen on 09/12/23 - history of gastric bypass 2013 - admitted in April with perforated marginal ulcer at surgical anastomosis. Repaired per Dr. Tanda. On protonix  BID and carafate , here for EGD to assess for mucosal healing. Otherwise feels well. Denies NSAIDs. No interval changes since the office visit. She is followed by Dr. Abran.   09/26/2023 12:14 PM  Amanda [name]  has presented today for endoscopic procedure(s), with the diagnosis of  Encounter Diagnoses  Name Primary?   Peptic ulcer Yes   History of gastric bypass   .  The various methods of evaluation and treatment have been discussed with the patient and/or family. After consideration of risks, benefits and other options for treatment, the patient has consented to  the endoscopic procedure(s).   The patient's history has been reviewed, patient examined, no change in status, stable for surgery.  I have reviewed the patient's chart and labs.  Questions were answered to the patient's satisfaction.    Marcey Naval, MD [facility]

## 2023-09-27 ENCOUNTER — Telehealth: Payer: Self-pay

## 2023-09-27 NOTE — Telephone Encounter (Signed)
  Follow up Call-     09/26/2023   11:12 AM  Call back number  Post procedure Call Back phone  # [phone]  Permission to leave phone message Yes     Patient questions:  Do you have a fever, pain , or abdominal swelling? Yes.   Pain Score  3 *  Have you tolerated food without any problems? Yes.    Have you been able to return to your normal activities? Yes.    Do you have any questions about your discharge instructions: Diet   No. Medications  No. Follow up visit  No.  Do you have questions or concerns about your Care? No.  Actions: * If pain score is 4 or above: No action needed, pain <4.

## 2023-09-28 LAB — SURGICAL PATHOLOGY

## 2023-09-29 ENCOUNTER — Other Ambulatory Visit (HOSPITAL_COMMUNITY): Payer: Self-pay

## 2023-09-30 ENCOUNTER — Ambulatory Visit: Payer: Self-pay | Admitting: Gastroenterology

## 2023-10-01 ENCOUNTER — Other Ambulatory Visit: Payer: Self-pay

## 2023-10-01 ENCOUNTER — Other Ambulatory Visit (HOSPITAL_COMMUNITY): Payer: Self-pay

## 2023-10-03 ENCOUNTER — Other Ambulatory Visit (HOSPITAL_COMMUNITY): Payer: Self-pay

## 2023-10-05 ENCOUNTER — Other Ambulatory Visit: Payer: Self-pay

## 2023-10-05 ENCOUNTER — Ambulatory Visit (INDEPENDENT_AMBULATORY_CARE_PROVIDER_SITE_OTHER): Admitting: Physician Assistant

## 2023-10-05 ENCOUNTER — Other Ambulatory Visit (HOSPITAL_COMMUNITY): Payer: Self-pay

## 2023-10-05 VITALS — BP 124/67

## 2023-10-05 DIAGNOSIS — E1165 Type 2 diabetes mellitus with hyperglycemia: Secondary | ICD-10-CM | POA: Diagnosis not present

## 2023-10-05 DIAGNOSIS — E876 Hypokalemia: Secondary | ICD-10-CM

## 2023-10-05 DIAGNOSIS — N390 Urinary tract infection, site not specified: Secondary | ICD-10-CM | POA: Diagnosis not present

## 2023-10-05 DIAGNOSIS — E785 Hyperlipidemia, unspecified: Secondary | ICD-10-CM | POA: Diagnosis not present

## 2023-10-05 DIAGNOSIS — Z8711 Personal history of peptic ulcer disease: Secondary | ICD-10-CM

## 2023-10-05 DIAGNOSIS — I10 Essential (primary) hypertension: Secondary | ICD-10-CM | POA: Diagnosis not present

## 2023-10-05 DIAGNOSIS — N1831 Chronic kidney disease, stage 3a: Secondary | ICD-10-CM

## 2023-10-05 DIAGNOSIS — N3281 Overactive bladder: Secondary | ICD-10-CM | POA: Diagnosis not present

## 2023-10-05 LAB — POCT GLYCOSYLATED HEMOGLOBIN (HGB A1C): Hemoglobin A1C: 5.2 % (ref 4.0–5.6)

## 2023-10-05 MED ORDER — METFORMIN HCL 500 MG PO TABS
ORAL_TABLET | ORAL | 1 refills | Status: DC
  Filled 2023-10-05: qty 90, 90d supply, fill #0

## 2023-10-05 MED ORDER — DAPAGLIFLOZIN PROPANEDIOL 10 MG PO TABS
10.0000 mg | ORAL_TABLET | Freq: Every day | ORAL | 1 refills | Status: DC
  Filled 2023-10-05: qty 90, 90d supply, fill #0

## 2023-10-05 NOTE — Progress Notes (Signed)
 Established Patient Office Visit  Subjective   Patient ID: Amanda [name], female    DOB: 1955/10/23  Age: 68 y.o. MRN: [id number]  Chief Complaint  Patient presents with   Medical Management of Chronic Issues    HPI Pt is a 68 yo female with T2DM, CKD, recent history of GI bleed, HLD, HTN, and recurrent UTIs who presents to the clinic for 3 month follow up.   She is doing better. She is not taking carafate  for GI bleed any more. She is taking protonix  twice a day. She is not having any upper abdominal pain.   She is seeing Urology for frequent UTIs and placed her on HIprex  for prevention. She has not started this yet because she was told to start after she finished sulfa  drug and unsure which one that is. She has had a recent UTI and treated with bactrim . She will continue myrbetriq  for OAB symptoms.   She is wearing her libre sensor and sugars are controlled. She denies any hypoglycemic events. She has only a few readings drop below the green.  Her fastings are 80-110 in mornings. She is compliant with meformin and farxiga . She is not exercising. She is eating a healthy low sugar/carb diet.    ROS See HPI.    Objective:     BP 124/67  BP Readings from Last 3 Encounters:  10/05/23 124/67  09/26/23 (!) 177/99  09/12/23 134/80   Wt Readings from Last 3 Encounters:  09/26/23 159 lb (72.1 kg)  09/12/23 159 lb 4 oz (72.2 kg)  07/09/23 162 lb (73.5 kg)    .SABRA Lab Results  Component Value Date   HGBA1C 5.2 10/05/2023       Physical Exam Constitutional:      Appearance: Normal appearance.  HENT:     Head: Normocephalic.  Cardiovascular:     Rate and Rhythm: Normal rate and regular rhythm.  Pulmonary:     Effort: Pulmonary effort is normal.  Musculoskeletal:     Right lower leg: No edema.     Left lower leg: No edema.  Neurological:     General: No focal deficit present.     Mental Status: She is alert and oriented to person, place, and time.  Psychiatric:         Mood and Affect: Mood normal.        Assessment & Plan:  .SABRAMadinah Quarry was seen today for medical management of chronic issues.  Diagnoses and all orders for this visit:  Type 2 diabetes mellitus with hyperglycemia, without long-term current use of insulin  (HCC) -     POCT HgB A1C -     dapagliflozin  propanediol (FARXIGA ) 10 MG TABS tablet; Take 1 tablet (10 mg total) by mouth daily. -     metFORMIN  (GLUCOPHAGE ) 500 MG tablet; Take one tablet daily with a meal. -     CMP14+EGFR  Dyslipidemia, goal LDL below 70 -     CMP14+EGFR  Essential hypertension -     CMP14+EGFR  Hypokalemia -     CMP14+EGFR  Stage 3a chronic kidney disease (HCC) -     dapagliflozin  propanediol (FARXIGA ) 10 MG TABS tablet; Take 1 tablet (10 mg total) by mouth daily. -     CMP14+EGFR  Recurrent UTI  OAB (overactive bladder)  History of gastric ulcer   A1C to goal but up some since last check 3 months ago Continue on metformin  and farxiga  Continue to use libre to check glucose readings Continue  with DM diet and try to start walking regularly BP to goal CMP ordered to check on kidney function On statin Eye and foot exam UTD, need to get copy of eye exam Declined covid and pneumonia vaccine today  GI to follow gastric ulcer On protonix  bid and doing well  Urology to follow recurrent UTIs and OAB Continue on myrbetriq  Ok to start Hiprex  since not taking bactrim  anymore   Return in about 6 months (around 04/05/2024) for DM follow up.    Joson Sapp, PA-C

## 2023-10-06 LAB — CMP14+EGFR
ALT: 14 IU/L (ref 0–32)
AST: 22 IU/L (ref 0–40)
Albumin: 3.6 g/dL — ABNORMAL LOW (ref 3.9–4.9)
Alkaline Phosphatase: 65 IU/L (ref 44–121)
BUN/Creatinine Ratio: 15 (ref 12–28)
BUN: 13 mg/dL (ref 8–27)
Bilirubin Total: 0.7 mg/dL (ref 0.0–1.2)
CO2: 24 mmol/L (ref 20–29)
Calcium: 9 mg/dL (ref 8.7–10.3)
Chloride: 105 mmol/L (ref 96–106)
Creatinine, Ser: 0.88 mg/dL (ref 0.57–1.00)
Globulin, Total: 2.4 g/dL (ref 1.5–4.5)
Glucose: 125 mg/dL — ABNORMAL HIGH (ref 70–99)
Potassium: 3.8 mmol/L (ref 3.5–5.2)
Sodium: 144 mmol/L (ref 134–144)
Total Protein: 6 g/dL (ref 6.0–8.5)
eGFR: 72 mL/min/1.73 (ref >59)

## 2023-10-08 ENCOUNTER — Other Ambulatory Visit (HOSPITAL_COMMUNITY): Payer: Self-pay

## 2023-10-08 ENCOUNTER — Ambulatory Visit: Payer: Self-pay | Admitting: Physician Assistant

## 2023-10-08 ENCOUNTER — Other Ambulatory Visit: Payer: Self-pay | Admitting: Physician Assistant

## 2023-10-08 NOTE — Progress Notes (Signed)
 Potassium looks great.  Calcium  better.  Albumin still low. Make sure getting in daily protein.  Liver enzymes look good.

## 2023-10-09 ENCOUNTER — Encounter: Payer: Self-pay | Admitting: Physician Assistant

## 2023-10-09 ENCOUNTER — Encounter: Payer: Self-pay | Admitting: Sports Medicine

## 2023-10-09 ENCOUNTER — Other Ambulatory Visit: Payer: Self-pay

## 2023-10-09 ENCOUNTER — Other Ambulatory Visit (HOSPITAL_COMMUNITY): Payer: Self-pay

## 2023-10-09 NOTE — Progress Notes (Signed)
 Assessment: 1.  Recurrent urinary tract infections.  Urine today is clear, she is asymptomatic  2.  Vaginal atrophic changes, has a prescription for estrogen cream but has not been using this regularly   Plan: 1.  It was recommended that the patient use the estrogen cream 2 nights a week long-term  2.  I have her come back in 1 year for recheck  3.  Discontinue the methenamine  after her current month runs out  History of Present Illness:  6.2.2025: Initial visit for evaluation of for recurrent urinary tract infections.  She has baseline urinary urgency and frequency, and does use pull-ups.  However, symptoms are worse when she has known UTIs.  Symptoms include dysuria, worsening frequency and urgency.  No gross hematuria.  She has a culture pending, growing gram-negative rods.  Last pathogen was a fairly sensitive Klebsiella.  She has seen Dr. MacDiarmid in the [facility] urology office in the past.  She had been on suppressive Macrobid .  Also, she had been put on estrogen cream by her PCP but did not use it long-term.  She denies any febrile UTIs.  9.3.2025: Here for follow-up.  She does not think she has had any urinary tract infections recently.  She rarely uses the estrogen cream.  She is voiding adequately.  She only started the methenamine  last week.  Past Medical History:  Past Medical History:  Diagnosis Date   Allergy    seasonal   Arthritis    all over   Blood dyscrasia    Carrys trait for Leiden Factor five never had any issues . Yhe only reason she was tested was because her mother had it   Diabetes mellitus without complication (HCC)    Pre surgery - Gastric sx 2014 Roun-Y no diabetes since  no meds   Fatty liver 05/2006   by US    Gout    Hip pain    Hypertension    Knee pain    Obesity    Pneumonia    Recurrent UTI    Sleep apnea    had roux en y lost 140lbs no longer needs cpap    Past Surgical History:  Past Surgical History:  Procedure  Laterality Date   APPENDECTOMY  1977   CESAREAN SECTION  1989   one   CHOLECYSTECTOMY  1977   DILATION AND CURETTAGE OF UTERUS  1987 & 1988   LAPAROSCOPY N/A 05/27/2023   Procedure: LAPAROSCOPY DIAGNOSTIC, LAPAROSCOPIC GRAHAM PATCH REPAIR, LAPARSCOPIC PRIMARY INCISIONAL HERNIA REPAIR;  Surgeon: Tanda Locus, MD;  Location: [facility];  Service: General;  Laterality: N/A;   REDUCTION MAMMAPLASTY Bilateral 1985   ROUX-EN-Y GASTRIC BYPASS  2014   TOTAL KNEE ARTHROPLASTY Right 07/21/2019   Procedure: TOTAL KNEE ARTHROPLASTY;  Surgeon: Melodi Lerner, MD;  Location: [facility];  Service: Orthopedics;  Laterality: Right;     Allergies:  Allergies  Allergen Reactions   Fenofibrate Other (See Comments)    Constipation   Fosamax  [Alendronate ] Other (See Comments)    GI side effects   Ozempic  (0.25 Or 0.5 Mg-Dose) [Semaglutide (0.25 Or 0.5mg -Dos)] Other (See Comments)    nausea   Sulfonamide Derivatives Dermatitis and Other (See Comments)    Unknown    Family History:  Family History  Problem Relation Age of Onset   Factor V Leiden deficiency Mother    Diabetes Mother    Hypertension Mother    Cancer Mother        breast- partial mastectomy- in her  80's   Heart attack Father    Hypertension Father    Arthritis Father    Gout Father    Parkinson's disease Father    Heart attack Maternal Grandmother    Alcohol abuse Maternal Grandfather    Cancer Maternal Grandfather        type unknown   Colon cancer Neg Hx    Esophageal cancer Neg Hx    Stomach cancer Neg Hx    Rectal cancer Neg Hx     Social History:  Social History   Tobacco Use   Smoking status: Former    Current packs/day: 0.00    Types: Cigarettes    Start date: 27    Quit date: 2001    Years since quitting: 24.6   Smokeless tobacco: Never  Vaping Use   Vaping status: Never Used  Substance Use Topics   Alcohol use: Yes    Comment: occasional  once a month   Drug use: No     Results:  I have reviewed  prior patient's records from [facility]  I have reviewed referring/prior physicians records  I have reviewed urinalysis was prior cultures

## 2023-10-10 ENCOUNTER — Ambulatory Visit: Admitting: Urology

## 2023-10-10 VITALS — BP 162/92 | HR 67 | Ht 66.0 in | Wt 154.0 lb

## 2023-10-10 DIAGNOSIS — N952 Postmenopausal atrophic vaginitis: Secondary | ICD-10-CM

## 2023-10-10 DIAGNOSIS — Z87440 Personal history of urinary (tract) infections: Secondary | ICD-10-CM

## 2023-10-10 DIAGNOSIS — N390 Urinary tract infection, site not specified: Secondary | ICD-10-CM | POA: Diagnosis not present

## 2023-10-10 LAB — URINALYSIS, ROUTINE W REFLEX MICROSCOPIC
Bilirubin, UA: NEGATIVE
Ketones, UA: NEGATIVE
Nitrite, UA: NEGATIVE
Protein,UA: NEGATIVE
RBC, UA: NEGATIVE
Specific Gravity, UA: 1.01 (ref 1.005–1.030)
Urobilinogen, Ur: 0.2 mg/dL (ref 0.2–1.0)
pH, UA: 6 (ref 5.0–7.5)

## 2023-10-10 LAB — MICROSCOPIC EXAMINATION

## 2023-10-15 ENCOUNTER — Other Ambulatory Visit (HOSPITAL_COMMUNITY): Payer: Self-pay

## 2023-10-15 ENCOUNTER — Encounter: Payer: Self-pay | Admitting: Physician Assistant

## 2023-10-23 ENCOUNTER — Encounter: Payer: Self-pay | Admitting: Physician Assistant

## 2023-10-24 ENCOUNTER — Other Ambulatory Visit: Payer: Self-pay | Admitting: Physician Assistant

## 2023-10-24 ENCOUNTER — Other Ambulatory Visit (HOSPITAL_COMMUNITY): Payer: Self-pay

## 2023-10-24 DIAGNOSIS — I10 Essential (primary) hypertension: Secondary | ICD-10-CM

## 2023-10-25 ENCOUNTER — Other Ambulatory Visit (HOSPITAL_COMMUNITY): Payer: Self-pay

## 2023-10-25 MED ORDER — LOSARTAN POTASSIUM 25 MG PO TABS
25.0000 mg | ORAL_TABLET | Freq: Every day | ORAL | 1 refills | Status: DC
  Filled 2023-10-25 – 2023-10-29 (×2): qty 90, 90d supply, fill #0

## 2023-10-29 ENCOUNTER — Other Ambulatory Visit (HOSPITAL_COMMUNITY): Payer: Self-pay

## 2023-10-29 ENCOUNTER — Other Ambulatory Visit: Payer: Self-pay | Admitting: Physician Assistant

## 2023-10-29 DIAGNOSIS — I10 Essential (primary) hypertension: Secondary | ICD-10-CM

## 2023-11-01 ENCOUNTER — Other Ambulatory Visit (HOSPITAL_COMMUNITY): Payer: Self-pay

## 2023-11-02 ENCOUNTER — Other Ambulatory Visit: Payer: Self-pay

## 2023-11-02 ENCOUNTER — Other Ambulatory Visit (HOSPITAL_COMMUNITY): Payer: Self-pay

## 2023-11-02 MED ORDER — SPIRONOLACTONE 25 MG PO TABS
25.0000 mg | ORAL_TABLET | Freq: Every day | ORAL | 0 refills | Status: DC
Start: 2023-11-02 — End: 2023-11-04
  Filled 2023-11-02: qty 90, 90d supply, fill #0

## 2023-11-04 ENCOUNTER — Other Ambulatory Visit: Payer: Self-pay | Admitting: Physician Assistant

## 2023-11-04 DIAGNOSIS — I10 Essential (primary) hypertension: Secondary | ICD-10-CM

## 2023-11-05 ENCOUNTER — Other Ambulatory Visit: Payer: Self-pay

## 2023-11-05 ENCOUNTER — Other Ambulatory Visit (HOSPITAL_COMMUNITY): Payer: Self-pay

## 2023-11-06 ENCOUNTER — Other Ambulatory Visit (HOSPITAL_COMMUNITY): Payer: Self-pay

## 2023-11-06 MED ORDER — SPIRONOLACTONE 25 MG PO TABS
25.0000 mg | ORAL_TABLET | Freq: Every day | ORAL | 0 refills | Status: DC
  Filled 2023-11-06 – 2023-11-12 (×2): qty 90, 90d supply, fill #0

## 2023-11-07 ENCOUNTER — Other Ambulatory Visit (HOSPITAL_COMMUNITY): Payer: Self-pay

## 2023-11-07 NOTE — Progress Notes (Unsigned)
 Ellouise Console, PA-C [address] [place], [place]  [address] Phone: [phone]   Primary Care Physician: Antoniette Vermell [place], [place]  Primary Gastroenterologist:  Ellouise Console, PA-C / Elspeth Naval, MD   Chief Complaint: Follow-up       HPI:   Amanda [name] is a 68 y.o. female  Current Outpatient Medications  Medication Sig Dispense Refill   acetaminophen  (TYLENOL ) 500 MG tablet Take 500 mg by mouth every 6 (six) hours as needed for moderate pain (pain score 4-6).     allopurinol  (ZYLOPRIM ) 100 MG tablet Take 1 tablet (100 mg total) by mouth daily. Needs appt 90 tablet 3   AMBULATORY NON FORMULARY MEDICATION Barislend 1 tablet by mouth once a day     atorvastatin  (LIPITOR) 40 MG tablet Take 1 tablet (40 mg total) by mouth daily. 90 tablet 3   Biotin 10000 MCG TABS Take 1 tablet by mouth daily.     calcium  citrate (CALCITRATE - DOSED IN MG ELEMENTAL CALCIUM ) 950 (200 Ca) MG tablet Take 200 mg of elemental calcium  by mouth daily.     Continuous Glucose Sensor (FREESTYLE LIBRE 14 DAY SENSOR) MISC Apply to upper deltoid every 14 days. Use reader to determine blood sugars 2 each 11   dapagliflozin  propanediol (FARXIGA ) 10 MG TABS tablet Take 1 tablet (10 mg total) by mouth daily. 90 tablet 1   estradiol  (ESTRACE  VAGINAL) 0.1 MG/GM vaginal cream Apply pea-sized amount over urethral opening daily. 42.5 g 1   fexofenadine (ALLEGRA) 180 MG tablet Take 180 mg by mouth daily as needed for allergies.     fluticasone (FLONASE) 50 MCG/ACT nasal spray Place 1 spray into both nostrils daily as needed for allergies.     losartan  (COZAAR ) 25 MG tablet Take 1 tablet (25 mg total) by mouth daily. 90 tablet 1   metFORMIN  (GLUCOPHAGE ) 500 MG tablet Take one tablet daily with a meal. 90 tablet 1   methenamine  (HIPREX ) 1 g tablet Take 1 tablet (1 g total) by mouth 2 (two) times daily with a meal. Start after the sulfa  tablet has been completed 60 tablet 11   methocarbamol  (ROBAXIN ) 500 MG  tablet Take 1 tablet (500 mg total) by mouth 4 (four) times daily. 120 tablet 1   mirabegron  ER (MYRBETRIQ ) 50 MG TB24 tablet Take 1 tablet (50 mg total) by mouth daily. 90 tablet 1   Multiple Vitamin (MULTI-VITAMIN) tablet Take 1 tablet by mouth in the morning and at bedtime.     pantoprazole  (PROTONIX ) 40 MG tablet Take 1 tablet (40 mg total) by mouth 2 (two) times daily. 180 tablet 3   Potassium Chloride  ER 20 MEQ TBCR Take 1 tablet (20 mEq total) by mouth daily for 10 days. 10 tablet 0   spironolactone  (ALDACTONE ) 25 MG tablet Take 1 tablet (25 mg total) by mouth daily. 90 tablet 0   Current Facility-Administered Medications  Medication Dose Route Frequency Provider Last Rate Last Admin   denosumab  (PROLIA ) injection 60 mg  60 mg Subcutaneous Once Breeback, Jade L, PA-C        Allergies as of 11/08/2023 - Review Complete 10/10/2023  Allergen Reaction Noted   Fenofibrate Other (See Comments) 05/23/2010   Fosamax  [alendronate ] Other (See Comments) 06/09/2023   Ozempic  (0.25 or 0.5 mg-dose) [semaglutide (0.25 or 0.5mg -dos)] Other (See Comments) 02/17/2022   Sulfonamide derivatives Dermatitis and Other (See Comments) 11/21/2010    Past Medical History:  Diagnosis Date   Allergy    seasonal  Arthritis    all over   Blood dyscrasia    Carrys trait for Leiden Factor five never had any issues . Yhe only reason she was tested was because her mother had it   Diabetes mellitus without complication (HCC)    Pre surgery - Gastric sx 2014 Roun-Y no diabetes since  no meds   Fatty liver 05/2006   by US    Gout    Hip pain    Hypertension    Knee pain    Obesity    Pneumonia    Recurrent UTI    Sleep apnea    had roux en y lost 140lbs no longer needs cpap    Past Surgical History:  Procedure Laterality Date   APPENDECTOMY  1977   CESAREAN SECTION  1989   one   CHOLECYSTECTOMY  1977   DILATION AND CURETTAGE OF UTERUS  1987 & 1988   LAPAROSCOPY N/A 05/27/2023   Procedure:  LAPAROSCOPY DIAGNOSTIC, LAPAROSCOPIC GRAHAM PATCH REPAIR, LAPARSCOPIC PRIMARY INCISIONAL HERNIA REPAIR;  Surgeon: Tanda Locus, MD;  Location: [facility];  Service: General;  Laterality: N/A;   REDUCTION MAMMAPLASTY Bilateral 1985   ROUX-EN-Y GASTRIC BYPASS  2014   TOTAL KNEE ARTHROPLASTY Right 07/21/2019   Procedure: TOTAL KNEE ARTHROPLASTY;  Surgeon: Melodi Lerner, MD;  Location: [facility];  Service: Orthopedics;  Laterality: Right;     Review of Systems:    All systems reviewed and negative except where noted in HPI.    Physical Exam:  There were no vitals taken for this visit. No LMP recorded. Patient is postmenopausal.  General: Well-nourished, well-developed in no acute distress.  Lungs: Clear to auscultation bilaterally. Non-labored. Heart: Regular rate and rhythm, no murmurs rubs or gallops.  Abdomen: Bowel sounds are normal; Abdomen is Soft; No hepatosplenomegaly, masses or hernias;  No Abdominal Tenderness; No guarding or rebound tenderness. Neuro: Alert and oriented x 3.  Grossly intact.  Psych: Alert and cooperative, normal mood and affect.   Imaging Studies: No results found.  Labs: CBC    Component Value Date/Time   WBC 6.3 09/12/2023 0951   RBC 3.92 09/12/2023 0951   HGB 12.7 09/12/2023 0951   HGB 12.0 01/01/2023 0921   HCT 38.4 09/12/2023 0951   HCT 38.0 01/01/2023 0921   PLT 149.0 (L) 09/12/2023 0951   PLT 148 (L) 01/01/2023 0921   MCV 97.8 09/12/2023 0951   MCV 102 (H) 01/01/2023 0921   MCH 33.5 05/31/2023 0500   MCHC 33.1 09/12/2023 0951   RDW 15.6 (H) 09/12/2023 0951   RDW 13.2 01/01/2023 0921   LYMPHSABS 2.7 09/12/2023 0951   LYMPHSABS 2.8 01/01/2023 0921   MONOABS 0.5 09/12/2023 0951   EOSABS 0.1 09/12/2023 0951   EOSABS 0.1 01/01/2023 0921   BASOSABS 0.0 09/12/2023 0951   BASOSABS 0.0 01/01/2023 0921    CMP     Component Value Date/Time   NA 144 10/05/2023 1014   K 3.8 10/05/2023 1014   CL 105 10/05/2023 1014   CO2 24 10/05/2023 1014    GLUCOSE 125 (H) 10/05/2023 1014   GLUCOSE 103 (H) 09/12/2023 0951   BUN 13 10/05/2023 1014   CREATININE 0.88 10/05/2023 1014   CREATININE 0.93 02/13/2022 1032   CALCIUM  9.0 10/05/2023 1014   PROT 6.0 10/05/2023 1014   ALBUMIN 3.6 (L) 10/05/2023 1014   AST 22 10/05/2023 1014   ALT 14 10/05/2023 1014   ALKPHOS 65 10/05/2023 1014   BILITOT 0.7 10/05/2023 1014   GFRNONAA >60  05/31/2023 0500   GFRNONAA 61 02/12/2020 1031   GFRAA 71 02/12/2020 1031       Assessment and Plan:   Amanda [name] is a 68 y.o. y/o female ***    Ellouise Console, PA-C  Follow up ***

## 2023-11-08 ENCOUNTER — Encounter: Payer: Self-pay | Admitting: Physician Assistant

## 2023-11-08 ENCOUNTER — Other Ambulatory Visit

## 2023-11-08 ENCOUNTER — Ambulatory Visit: Admitting: Physician Assistant

## 2023-11-08 VITALS — BP 130/74 | HR 71 | Ht 67.0 in | Wt 153.2 lb

## 2023-11-08 DIAGNOSIS — E876 Hypokalemia: Secondary | ICD-10-CM | POA: Diagnosis not present

## 2023-11-08 DIAGNOSIS — R634 Abnormal weight loss: Secondary | ICD-10-CM

## 2023-11-08 DIAGNOSIS — R197 Diarrhea, unspecified: Secondary | ICD-10-CM

## 2023-11-08 DIAGNOSIS — K869 Disease of pancreas, unspecified: Secondary | ICD-10-CM

## 2023-11-08 DIAGNOSIS — Z8711 Personal history of peptic ulcer disease: Secondary | ICD-10-CM

## 2023-11-08 LAB — BASIC METABOLIC PANEL WITH GFR
BUN: 14 mg/dL (ref 6–23)
CO2: 30 meq/L (ref 19–32)
Calcium: 9.4 mg/dL (ref 8.4–10.5)
Chloride: 104 meq/L (ref 96–112)
Creatinine, Ser: 0.79 mg/dL (ref 0.40–1.20)
GFR: 77.19 mL/min (ref >60.00)
Glucose, Bld: 103 mg/dL — ABNORMAL HIGH (ref 70–99)
Potassium: 3.6 meq/L (ref 3.5–5.1)
Sodium: 144 meq/L (ref 135–145)

## 2023-11-08 LAB — CBC WITH DIFFERENTIAL/PLATELET
Basophils Absolute: 0 K/uL (ref 0.0–0.1)
Basophils Relative: 0.4 % (ref 0.0–3.0)
Eosinophils Absolute: 0.1 K/uL (ref 0.0–0.7)
Eosinophils Relative: 1 % (ref 0.0–5.0)
HCT: 37.8 % (ref 36.0–46.0)
Hemoglobin: 12.6 g/dL (ref 12.0–15.0)
Lymphocytes Relative: 28.2 % (ref 12.0–46.0)
Lymphs Abs: 2.3 K/uL (ref 0.7–4.0)
MCHC: 33.3 g/dL (ref 30.0–36.0)
MCV: 99.5 fl (ref 78.0–100.0)
Monocytes Absolute: 0.6 K/uL (ref 0.1–1.0)
Monocytes Relative: 7.8 % (ref 3.0–12.0)
Neutro Abs: 5.1 K/uL (ref 1.4–7.7)
Neutrophils Relative %: 62.6 % (ref 43.0–77.0)
Platelets: 150 K/uL (ref 150.0–400.0)
RBC: 3.8 Mil/uL — ABNORMAL LOW (ref 3.87–5.11)
RDW: 14.3 % (ref 11.5–15.5)
WBC: 8.2 K/uL (ref 4.0–10.5)

## 2023-11-08 LAB — POTASSIUM: Potassium: 3.6 meq/L (ref 3.5–5.1)

## 2023-11-08 NOTE — Progress Notes (Signed)
 Noted

## 2023-11-08 NOTE — Patient Instructions (Signed)
 Please go to the lab in the basement of our building to have lab work done as you leave today. Hit B for basement when you get on the elevator.  When the doors open the lab is on your left.  We will call you with the results. Thank you.   You have been scheduled for an MRI Abdomen at [facility], Radiology on Saturday, October 4th. Your appointment time is 10:00am. Please arrive to admitting (at main entrance of the hospital) 30 minutes prior to your appointment time for registration purposes. Please make certain not to have anything to eat or drink 4 hours prior to your test. In addition, if you have any metal in your body, have a pacemaker or defibrillator, please be sure to let your ordering physician know. This test typically takes 45 minutes to 1 hour to complete. Should you need to reschedule, please call [phone] to do so.   You have been scheduled for a follow up appointment on Thursday, 12-27-23 at 10:40am. Please arrive 10 minutes early for registration. If you need to reschedule or cancel this appointment please call [phone] as soon as possible. Thank you.   Thank you for entrusting me with your care and for choosing [facility], Ellouise Console, [place]   _______________________________________________________  If your blood pressure at your visit was 140/90 or greater, please contact your primary care physician to follow up on this.  _______________________________________________________  If you are age 47 or older, your body mass index should be between 23-30. Your Body mass index is 24 kg/m. If this is out of the aforementioned range listed, please consider follow up with your Primary Care Provider.  If you are age 52 or younger, your body mass index should be between 19-25. Your Body mass index is 24 kg/m. If this is out of the aformentioned range listed, please consider follow up with your Primary Care Provider.    ________________________________________________________  The [facility] providers would like to encourage you to use MYCHART to communicate with providers for non-urgent requests or questions.  Due to long hold times on the telephone, sending your provider a message by [facility] may be a faster and more efficient way to get a response.  Please allow 48 business hours for a response.  Please remember that this is for non-urgent requests.  _______________________________________________________  [facility] is using a team-based approach to care.  Your team is made up of your doctor and two to three APPS. Our APPS (Nurse Practitioners and Physician Assistants) work with your physician to ensure care continuity for you. They are fully qualified to address your health concerns and develop a treatment plan. They communicate directly with your gastroenterologist to care for you. Seeing the Advanced Practice Practitioners on your physician's team can help you by facilitating care more promptly, often allowing for earlier appointments, access to diagnostic testing, procedures, and other specialty referrals.

## 2023-11-09 ENCOUNTER — Ambulatory Visit: Payer: Self-pay | Admitting: Physician Assistant

## 2023-11-09 ENCOUNTER — Telehealth: Payer: Self-pay | Admitting: Physician Assistant

## 2023-11-09 LAB — SPECIMEN STATUS REPORT

## 2023-11-09 LAB — TSH+FREE T4: TSH W/REFLEX TO FT4: 1.77 m[IU]/L (ref 0.40–4.50)

## 2023-11-09 LAB — CANCER ANTIGEN 19-9: CA 19-9: 30 U/mL (ref <34)

## 2023-11-09 LAB — CLOSTRIDIUM DIFFICILE BY PCR: Toxigenic C. Difficile by PCR: NEGATIVE

## 2023-11-09 NOTE — Telephone Encounter (Signed)
 Pt notified via mychart

## 2023-11-09 NOTE — Telephone Encounter (Signed)
 PT is calling to update that she has not used potassium in over a month.

## 2023-11-10 ENCOUNTER — Ambulatory Visit (HOSPITAL_COMMUNITY)

## 2023-11-10 LAB — GI PROFILE, STOOL, PCR

## 2023-11-11 ENCOUNTER — Ambulatory Visit (HOSPITAL_COMMUNITY)
Admission: RE | Admit: 2023-11-11 | Discharge: 2023-11-11 | Disposition: A | Discharge status: Home / Self Care | Source: Ambulatory Visit | Attending: Physician Assistant | Admitting: Physician Assistant

## 2023-11-11 DIAGNOSIS — K869 Disease of pancreas, unspecified: Secondary | ICD-10-CM | POA: Diagnosis not present

## 2023-11-11 DIAGNOSIS — Z9049 Acquired absence of other specified parts of digestive tract: Secondary | ICD-10-CM | POA: Diagnosis not present

## 2023-11-11 DIAGNOSIS — R634 Abnormal weight loss: Secondary | ICD-10-CM | POA: Diagnosis not present

## 2023-11-11 DIAGNOSIS — K8689 Other specified diseases of pancreas: Secondary | ICD-10-CM | POA: Diagnosis not present

## 2023-11-11 MED ORDER — GADOBUTROL 1 MMOL/ML IV SOLN
7.0000 mL | Freq: Once | INTRAVENOUS | Status: AC | PRN
  Administered 2023-11-11: 7 mL via INTRAVENOUS

## 2023-11-12 ENCOUNTER — Other Ambulatory Visit (HOSPITAL_COMMUNITY): Payer: Self-pay

## 2023-11-12 ENCOUNTER — Other Ambulatory Visit: Payer: Self-pay | Admitting: Physician Assistant

## 2023-11-12 DIAGNOSIS — R634 Abnormal weight loss: Secondary | ICD-10-CM

## 2023-11-12 DIAGNOSIS — K869 Disease of pancreas, unspecified: Secondary | ICD-10-CM

## 2023-11-13 LAB — PANCREATIC ELASTASE, FECAL: Pancreatic Elastase-1, Stool: 18 ug/g — ABNORMAL LOW (ref >200)

## 2023-11-14 NOTE — Progress Notes (Signed)
 Please let patient know her fecal pancreatic elastase stool test is very low.  This is consistent with severe exocrine pancreatic insufficiency which can cause diarrhea.  I recommend she start Creon 36,000 lipase units, take 2 capsules with each meal and 1 with each snack.  Okay to give samples and prescription.  Keep follow-up appointment in 6 weeks to assess response to treatment. Ellouise Console, PA-C

## 2023-11-14 NOTE — Telephone Encounter (Signed)
 Attempted to contact patient . Reached VM. Requested return call to discuss lab results/recommendations.

## 2023-11-15 ENCOUNTER — Other Ambulatory Visit: Payer: Self-pay

## 2023-11-15 ENCOUNTER — Other Ambulatory Visit (HOSPITAL_COMMUNITY): Payer: Self-pay

## 2023-11-15 MED ORDER — PANCRELIPASE (LIP-PROT-AMYL) 36000-114000 UNITS PO CPEP
ORAL_CAPSULE | ORAL | 5 refills | Status: DC
Start: 2023-11-15 — End: 2023-12-27
  Filled 2023-11-15: qty 300, 34d supply, fill #0

## 2023-11-29 ENCOUNTER — Other Ambulatory Visit: Payer: Self-pay

## 2023-11-29 ENCOUNTER — Telehealth: Payer: Self-pay

## 2023-11-29 NOTE — Telephone Encounter (Signed)
 Prolia  VOB initiated via [email or web address]  Next Prolia  inj DUE: 12/28/23

## 2023-12-03 ENCOUNTER — Other Ambulatory Visit (HOSPITAL_COMMUNITY): Payer: Self-pay

## 2023-12-03 NOTE — Telephone Encounter (Signed)
 Pt ready for scheduling for PROLIA  on or after : 12/28/23  Option# 1: Buy/Bill (Office supplied medication)  Out-of-pocket cost due at time of clinic visit: $332  Number of injection/visits approved: ---  Primary: [employer] Prolia  co-insurance: 20% Admin fee co-insurance: 0%  Secondary: --- Prolia  co-insurance:  Admin fee co-insurance:   Medical Benefit Details: Date Benefits were checked: 11/29/23 Deductible: NO/ Coinsurance: 20%/ Admin Fee: 0%  Prior Auth: N/A PA# Expiration Date:   # of doses approved: ----------------------------------------------------------------------- Option# 2- Med Obtained from pharmacy: Prolia  is no longer preferred for pharmacy benefit. Jubbonti is now preferred. PRICING IS FOR JUBBONTI  Pharmacy benefit: Copay $0 (Paid to pharmacy) Admin Fee: 0% (Pay at clinic)  Prior Auth: N/A PA# Expiration Date:   # of doses approved:   If patient wants fill through the pharmacy benefit please send prescription to: [facility], and include estimated need by date in rx notes. Pharmacy will ship medication directly to the office.  Patient NOT eligible for Prolia  Copay Card. Copay Card can make patient's cost as little as $25. Link to apply: [email or web address]://[email or web address].[email or web address]/copay  ** This summary of benefits is an estimation of the patient's out-of-pocket cost. Exact cost may very based on individual plan coverage.

## 2023-12-18 ENCOUNTER — Other Ambulatory Visit (HOSPITAL_COMMUNITY): Payer: Self-pay

## 2023-12-18 ENCOUNTER — Telehealth: Payer: Self-pay | Admitting: Pharmacy Technician

## 2023-12-18 ENCOUNTER — Telehealth: Payer: Self-pay | Admitting: Physician Assistant

## 2023-12-18 ENCOUNTER — Other Ambulatory Visit: Payer: Self-pay

## 2023-12-18 ENCOUNTER — Ambulatory Visit: Payer: Self-pay

## 2023-12-18 DIAGNOSIS — E1165 Type 2 diabetes mellitus with hyperglycemia: Secondary | ICD-10-CM

## 2023-12-18 DIAGNOSIS — N1831 Chronic kidney disease, stage 3a: Secondary | ICD-10-CM

## 2023-12-18 DIAGNOSIS — M1A4720 Other secondary chronic gout, left ankle and foot, without tophus (tophi): Secondary | ICD-10-CM

## 2023-12-18 DIAGNOSIS — K631 Perforation of intestine (nontraumatic): Secondary | ICD-10-CM

## 2023-12-18 DIAGNOSIS — N390 Urinary tract infection, site not specified: Secondary | ICD-10-CM

## 2023-12-18 DIAGNOSIS — N3281 Overactive bladder: Secondary | ICD-10-CM

## 2023-12-18 DIAGNOSIS — E7800 Pure hypercholesterolemia, unspecified: Secondary | ICD-10-CM

## 2023-12-18 DIAGNOSIS — I10 Essential (primary) hypertension: Secondary | ICD-10-CM

## 2023-12-18 DIAGNOSIS — N3942 Incontinence without sensory awareness: Secondary | ICD-10-CM

## 2023-12-18 DIAGNOSIS — Z9884 Bariatric surgery status: Secondary | ICD-10-CM

## 2023-12-18 DIAGNOSIS — Z8711 Personal history of peptic ulcer disease: Secondary | ICD-10-CM

## 2023-12-18 DIAGNOSIS — E785 Hyperlipidemia, unspecified: Secondary | ICD-10-CM

## 2023-12-18 MED ORDER — DAPAGLIFLOZIN PROPANEDIOL 10 MG PO TABS: 10.0000 mg | ORAL_TABLET | Freq: Every day | ORAL | 1 refills | Status: AC

## 2023-12-18 MED ORDER — ATORVASTATIN CALCIUM 40 MG PO TABS: 40.0000 mg | ORAL_TABLET | Freq: Every day | ORAL | 3 refills | Status: AC

## 2023-12-18 MED ORDER — SPIRONOLACTONE 25 MG PO TABS: 25.0000 mg | ORAL_TABLET | Freq: Every day | ORAL | 0 refills | Status: DC

## 2023-12-18 MED ORDER — PANTOPRAZOLE SODIUM 40 MG PO TBEC: 40.0000 mg | DELAYED_RELEASE_TABLET | Freq: Two times a day (BID) | ORAL | 3 refills | Status: AC

## 2023-12-18 MED ORDER — METFORMIN HCL 500 MG PO TABS: ORAL_TABLET | ORAL | 1 refills | Status: AC

## 2023-12-18 MED ORDER — LOSARTAN POTASSIUM 25 MG PO TABS: 25.0000 mg | ORAL_TABLET | Freq: Every day | ORAL | 1 refills | Status: AC

## 2023-12-18 MED ORDER — MIRABEGRON ER 50 MG PO TB24: 50.0000 mg | ORAL_TABLET | Freq: Every day | ORAL | 1 refills | Status: AC

## 2023-12-18 MED ORDER — FREESTYLE LIBRE 14 DAY SENSOR MISC: 1.0000 | 11 refills | Status: AC

## 2023-12-18 MED ORDER — ALLOPURINOL 100 MG PO TABS: 100.0000 mg | ORAL_TABLET | Freq: Every day | ORAL | 3 refills | Status: AC

## 2023-12-18 NOTE — Telephone Encounter (Signed)
 Pharmacy Patient Advocate Encounter   Received notification from Onbase that prior authorization for Mirabegron  ER 50MG  er tablets is required/requested.   Insurance verification completed.   The patient is insured through [facility] ADVANTAGE/RX ADVANCE.   Per test claim:  Myrbetriq   is preferred by the insurance.  If suggested medication is appropriate, Please send in a new RX and discontinue this one. If not, please advise as to why it's not appropriate so that we may request a Prior Authorization. Please note, some preferred medications may still require a PA.  If the suggested medications have not been trialed and there are no contraindications to their use, the PA will not be submitted, as it will not be approved.   Called [facility] and had then run the claim for the brand name. Claim paid at $0.00 copay for a 90 day supply. New prescription is not required.

## 2023-12-18 NOTE — Telephone Encounter (Signed)
 Pharmacy Patient Advocate Encounter   Received notification from Onbase that prior authorization for Dapagliflozin  Propanediol 10MG  tablets is required/requested.   Insurance verification completed.   The patient is insured through [facility] ADVANTAGE/RX ADVANCE.   Per test claim:  Farxiga  is preferred by the insurance.  If suggested medication is appropriate, Please send in a new RX and discontinue this one. If not, please advise as to why it's not appropriate so that we may request a Prior Authorization. Please note, some preferred medications may still require a PA.  If the suggested medications have not been trialed and there are no contraindications to their use, the PA will not be submitted, as it will not be approved.  Called [facility] and had claim ran for brand name. Advised they did receive a paid claim for $0.00 copay for 90 day supply. New prescription is not required.

## 2023-12-18 NOTE — Telephone Encounter (Signed)
 Prescriptions [facility] per pts request

## 2023-12-18 NOTE — Telephone Encounter (Signed)
 FYI Only or Action Required?: FYI only for provider: appointment scheduled on 12/19/23.  Patient was last seen in primary care on 10/05/2023 by Antoniette Vermell CROME, PA-C.  Called Nurse Triage reporting Memory Loss.  Symptoms began states a long time.  Interventions attempted: Other: states was seen by neurology and had dementia screening.  Symptoms are: gradually worsening.  Triage Disposition: See Physician Within 24 Hours  Patient/caregiver understands and will follow disposition?: Yes  Reason for Disposition  [1] Confusion getting worse AND [2] slow onset (days to weeks)  Answer Assessment - Initial Assessment Questions Patient reports chronic memory loss. States has been seen for in the past. Hx of chronic UTIs, denies current c/o dysuria.   Pt can recite: name, DOB, telephone number and was able to name the president and state of residence. Was not aware of the date.   This RN scheduled an appointment and requested pt write it down as her husband will be coming with her. She read back the correct date and time. Requested she call back if symptoms worsen or if she has any other concerns.   1. MAIN CONCERN OR SYMPTOM:  What is your main concern right now? What questions do you have? What's the main symptom you're worried about? (e.g., confusion, memory loss)    See above  2. ONSET:  When did the symptom start (or worsen)? (minutes, hours, days, weeks)     Chronic, ongoing  3. BETTER-SAME-WORSE: Are you (the patient) getting better, staying the same, or getting worse compared to the day you (they) were diagnosed or most recent hospital discharge?     Same, possibly worsening  4. DIAGNOSIS: Was the dementia diagnosed by a doctor? If Yes, ask: When? (e.g., days, months, years ago)     Pt unclear if she has had a formal diagnosis  5. SUPPORT: What type of support do you (the patient) have? Note: Document living circumstances and support (e.g., family, nursing home).      Husband, he will come to her appt as well  Protocols used: Dementia Symptoms and Questions-A-AH  Copied from CRM [id number]. Topic: Clinical - Red Word Triage >> Dec 18, 2023  9:11 AM Wess RAMAN wrote: Red Word that prompted transfer to Nurse Triage: Memory loss. Pain in chest, described it as soreness.

## 2023-12-18 NOTE — Telephone Encounter (Signed)
 Copied from CRM [id number]. Topic: Clinical - Medication Refill >> Dec 18, 2023 10:22 AM Amanda [name] wrote: [facility] called following up on fax from 11/05. Pt wants all of her medications sent [facility]

## 2023-12-19 ENCOUNTER — Encounter: Payer: Self-pay | Admitting: Physician Assistant

## 2023-12-19 ENCOUNTER — Other Ambulatory Visit: Payer: Self-pay | Admitting: Physician Assistant

## 2023-12-19 ENCOUNTER — Other Ambulatory Visit: Payer: Self-pay

## 2023-12-19 ENCOUNTER — Ambulatory Visit (INDEPENDENT_AMBULATORY_CARE_PROVIDER_SITE_OTHER): Admitting: Physician Assistant

## 2023-12-19 VITALS — BP 140/80 | HR 88 | Ht 67.0 in | Wt 147.0 lb

## 2023-12-19 DIAGNOSIS — R634 Abnormal weight loss: Secondary | ICD-10-CM

## 2023-12-19 DIAGNOSIS — N390 Urinary tract infection, site not specified: Secondary | ICD-10-CM

## 2023-12-19 DIAGNOSIS — R413 Other amnesia: Secondary | ICD-10-CM | POA: Diagnosis not present

## 2023-12-19 DIAGNOSIS — K5900 Constipation, unspecified: Secondary | ICD-10-CM | POA: Diagnosis not present

## 2023-12-19 DIAGNOSIS — R300 Dysuria: Secondary | ICD-10-CM

## 2023-12-19 LAB — POCT URINALYSIS DIP (CLINITEK)
Bilirubin, UA: NEGATIVE
Blood, UA: NEGATIVE
Glucose, UA: >=1000 mg/dL — AB
Ketones, POC UA: NEGATIVE mg/dL
Nitrite, UA: NEGATIVE
POC PROTEIN,UA: NEGATIVE
Spec Grav, UA: 1.015 (ref 1.010–1.025)
Urobilinogen, UA: 0.2 U/dL
pH, UA: 5.5 (ref 5.0–8.0)

## 2023-12-19 MED ORDER — NITROFURANTOIN MONOHYD MACRO 100 MG PO CAPS: 100.0000 mg | ORAL_CAPSULE | Freq: Two times a day (BID) | ORAL | 0 refills | Status: DC

## 2023-12-19 MED ORDER — PROLIA 60 MG/ML ~~LOC~~ SOSY
60.0000 mg | PREFILLED_SYRINGE | Freq: Once | SUBCUTANEOUS | 0 refills | Status: AC
  Filled 2023-12-19: qty 1, 1d supply, fill #0
  Filled 2023-12-21: qty 1, 180d supply, fill #0

## 2023-12-19 NOTE — Progress Notes (Signed)
 "  Established Patient Office Visit  Subjective   Patient ID: Amanda [name], female    DOB: [birth year]  Age: 68 y.o. MRN: [id number]  Chief Complaint  Patient presents with   Medical Management of Chronic Issues    HPI .SABRADiscussed the use of AI scribe software for clinical note transcription with the patient, who gave verbal consent to proceed.  History of Present Illness Amanda [name] is a 68 year old female who presents with sudden memory changes. She is accompanied by her husband.   Acute memory impairment - Sudden onset of memory changes for the past three weeks - Frequently asks about the time and date - Gets lost while driving to familiar places - Incident of getting lost in [place] and unable to find grandson's preschool - Family expresses concern regarding cognitive changes - she has been evaluated with neurology in the past and thought cognition changes were acute during her episodes with UTI infections  Unintentional weight loss and anorexia - Significant weight loss over an unspecified period - Lack of appetite  Abdominal pain and gastrointestinal symptoms - Sharp abdominal pain, relieved by antacids - History of pancreatic issues, with prior MRI performed - Takes pantoprazole  once daily for abdominal symptoms  Constipation - No bowel movement in several weeks - Attributes constipation to decreased food intake - History of large, soft stools following surgery in April, but now no bowel movements  Urinary tract infection concerns - History of urinary tract infections - Concerned that a UTI may be affecting memory - No current urinary symptoms - Dislikes methenamine  that is given by urology     ROS See HPI.    Objective:     BP (!) 140/80   Pulse 88   Ht 5' 7 (1.702 m)   Wt 147 lb (66.7 kg)   SpO2 99%   BMI 23.02 kg/m  BP Readings from Last 3 Encounters:  12/19/23 (!) 140/80  11/08/23 130/74  10/10/23 (!) 162/92   Wt Readings from  Last 3 Encounters:  12/19/23 147 lb (66.7 kg)  11/08/23 153 lb 4 oz (69.5 kg)  10/10/23 154 lb (69.9 kg)        12/19/2023    9:00 AM 02/13/2022    2:19 PM  Montreal Cognitive Assessment   Visuospatial/ Executive (0/5) 2 5  Naming (0/3) 3 3  Attention: Read list of digits (0/2) 2 2  Attention: Read list of letters (0/1) 1 1  Attention: Serial 7 subtraction starting at 100 (0/3) 3 3  Language: Repeat phrase (0/2) 2 2  Language : Fluency (0/1) 1 1  Abstraction (0/2) 2 2  Delayed Recall (0/5) 4 4  Orientation (0/6) 6 6  Total 26 29  Adjusted Score (based on education)  29     Physical Exam HENT:     Head: Normocephalic.  Cardiovascular:     Rate and Rhythm: Normal rate and regular rhythm.  Pulmonary:     Effort: Pulmonary effort is normal.     Breath sounds: Normal breath sounds.  Abdominal:     Palpations: Abdomen is soft.     Comments: Increased bowel sounds with slightly distended abdomen and generalized abdominal tenderness to palpation.   Musculoskeletal:     Right lower leg: No edema.     Left lower leg: No edema.  Neurological:     Mental Status: She is alert.  Psychiatric:     Comments: Flat affect.       Results for  orders placed or performed in visit on 12/19/23  POCT URINALYSIS DIP (CLINITEK)  Result Value Ref Range   Color, UA yellow yellow   Clarity, UA cloudy (A) clear   Glucose, UA >=1,000 (A) negative mg/dL   Bilirubin, UA negative negative   Ketones, POC UA negative negative mg/dL   Spec Grav, UA 8.984 8.989 - 1.025   Blood, UA negative negative   pH, UA 5.5 5.0 - 8.0   POC PROTEIN,UA negative negative, trace   Urobilinogen, UA 0.2 0.2 or 1.0 E.U./dL   Nitrite, UA Negative Negative   Leukocytes, UA Small (1+) (A) Negative     Assessment & Plan:  .SABRAKarisma [name] was seen today for medical management of chronic issues.  Diagnoses and all orders for this visit:  Constipation, unspecified constipation type -     CBC w/Diff/Platelet -      CMP14+EGFR -     TSH + free T4 -     Sed Rate (ESR) -     Lipase -     Fe+TIBC+Fer -     C-reactive protein  Dysuria -     POCT URINALYSIS DIP (CLINITEK) -     Urine Culture -     CBC w/Diff/Platelet -     CMP14+EGFR -     TSH + free T4 -     Sed Rate (ESR) -     Lipase -     Fe+TIBC+Fer -     C-reactive protein  Unintended weight loss -     CBC w/Diff/Platelet -     CMP14+EGFR -     TSH + free T4 -     Sed Rate (ESR) -     Lipase -     Fe+TIBC+Fer -     C-reactive protein  Recurrent UTI -     CBC w/Diff/Platelet -     CMP14+EGFR -     TSH + free T4 -     Sed Rate (ESR) -     Lipase -     Fe+TIBC+Fer -     C-reactive protein  Memory changes -     CBC w/Diff/Platelet -     CMP14+EGFR -     TSH + free T4 -     Sed Rate (ESR) -     Lipase -     Fe+TIBC+Fer -     C-reactive protein  Other orders -     Discontinue: nitrofurantoin , macrocrystal-monohydrate, (MACROBID ) 100 MG capsule; Take 1 capsule (100 mg total) by mouth 2 (two) times daily.   Assessment & Plan Acute cognitive impairment Sudden memory changes and disorientation over three weeks. Differential includes UTI and constipation. Previous neurology evaluation did not indicate cognitive medications, but reconsideration may be needed if no improvement after addressing other factors. MOCA decreased slightly from previous check to 26/30.  - Ordered urine analysis for UTI, small leuks only.  - Checked CBC and other relevant labs. - Consider neurology referral if no improvement after addressing UTI and constipation.  Urinary tract infection Possible UTI indicated by cloudy urine and leukocytes. Cognitive changes may be related to UTI. Constipation may contribute to UTI. - Started medication for UTI, macrobid  for 7 days.  - Await urine culture results.  Constipation Severe constipation with no bowel movement for a week, causing bloating and discomfort. May contribute to cognitive impairment and UTI. - Start  Miralax once daily, increase to twice daily if needed. - Monitor bowel movements and adjust treatment.  Abdominal  pain Intermittent pain possibly related to constipation. Previous MRI and GI evaluation. Pain may be exacerbated by constipation. - Monitor pain and response to constipation treatment. - Consider gastroenterology follow-up if symptoms persist.  Unintentional weight loss Significant weight loss with decreased appetite and abdominal pain. Previous evaluations negative. Constipation and potential UTI may contribute. - Evaluate for underlying causes if symptoms persist.    Vermell Bologna, PA-C  "

## 2023-12-19 NOTE — Patient Instructions (Addendum)
 Start miralax 1 capful up to 2 times a day with 4oz of juice for constipation.  Start macrobid  and stop methenamine  while on antibiotic.

## 2023-12-20 ENCOUNTER — Telehealth: Payer: Self-pay | Admitting: Physician Assistant

## 2023-12-20 ENCOUNTER — Other Ambulatory Visit (HOSPITAL_COMMUNITY): Payer: Self-pay

## 2023-12-20 LAB — CBC WITH DIFFERENTIAL/PLATELET
Basophils Absolute: 0.1 x10E3/uL (ref 0.0–0.2)
Basos: 1 %
EOS (ABSOLUTE): 0.1 x10E3/uL (ref 0.0–0.4)
Eos: 1 %
Hematocrit: 37.3 % (ref 34.0–46.6)
Hemoglobin: 12.4 g/dL (ref 11.1–15.9)
Immature Grans (Abs): 0 x10E3/uL (ref 0.0–0.1)
Immature Granulocytes: 0 %
Lymphocytes Absolute: 2.8 x10E3/uL (ref 0.7–3.1)
Lymphs: 37 %
MCH: 33 pg (ref 26.6–33.0)
MCHC: 33.2 g/dL (ref 31.5–35.7)
MCV: 99 fL — ABNORMAL HIGH (ref 79–97)
Monocytes Absolute: 0.5 x10E3/uL (ref 0.1–0.9)
Monocytes: 7 %
Neutrophils Absolute: 4.1 x10E3/uL (ref 1.4–7.0)
Neutrophils: 54 %
Platelets: 168 x10E3/uL (ref 150–450)
RBC: 3.76 x10E6/uL — ABNORMAL LOW (ref 3.77–5.28)
RDW: 12.2 % (ref 11.7–15.4)
WBC: 7.5 x10E3/uL (ref 3.4–10.8)

## 2023-12-20 LAB — CMP14+EGFR
ALT: 16 IU/L (ref 0–32)
AST: 17 IU/L (ref 0–40)
Albumin: 3.8 g/dL — ABNORMAL LOW (ref 3.9–4.9)
Alkaline Phosphatase: 70 IU/L (ref 49–135)
BUN/Creatinine Ratio: 17 (ref 12–28)
BUN: 16 mg/dL (ref 8–27)
Bilirubin Total: 1 mg/dL (ref 0.0–1.2)
CO2: 27 mmol/L (ref 20–29)
Calcium: 9.9 mg/dL (ref 8.7–10.3)
Chloride: 102 mmol/L (ref 96–106)
Creatinine, Ser: 0.96 mg/dL (ref 0.57–1.00)
Globulin, Total: 2.4 g/dL (ref 1.5–4.5)
Glucose: 111 mg/dL — ABNORMAL HIGH (ref 70–99)
Potassium: 3.5 mmol/L (ref 3.5–5.2)
Sodium: 143 mmol/L (ref 134–144)
Total Protein: 6.2 g/dL (ref 6.0–8.5)
eGFR: 65 mL/min/1.73 (ref >59)

## 2023-12-20 LAB — TSH+FREE T4
Free T4: 1.39 ng/dL (ref 0.82–1.77)
TSH: 1.65 u[IU]/mL (ref 0.450–4.500)

## 2023-12-20 LAB — IRON,TIBC AND FERRITIN PANEL
Ferritin: 375 ng/mL — ABNORMAL HIGH (ref 15–150)
Iron Saturation: 15 % (ref 15–55)
Iron: 32 ug/dL (ref 27–139)
Total Iron Binding Capacity: 209 ug/dL — ABNORMAL LOW (ref 250–450)
UIBC: 177 ug/dL (ref 118–369)

## 2023-12-20 LAB — LIPASE: Lipase: 24 U/L (ref 14–72)

## 2023-12-20 LAB — C-REACTIVE PROTEIN: CRP: 20 mg/L — ABNORMAL HIGH (ref 0–10)

## 2023-12-20 LAB — SEDIMENTATION RATE: Sed Rate: 15 mm/h (ref 0–40)

## 2023-12-20 MED ORDER — NITROFURANTOIN MONOHYD MACRO 100 MG PO CAPS: 100.0000 mg | ORAL_CAPSULE | Freq: Two times a day (BID) | ORAL | 0 refills | Status: AC

## 2023-12-20 NOTE — Addendum Note (Signed)
 Addended by: ANTONIETTE VERMELL CROME on: 12/20/2023 03:43 PM   Modules accepted: Orders

## 2023-12-20 NOTE — Telephone Encounter (Signed)
 Copied from CRM [id number]. Topic: Clinical - Prescription Issue >> Dec 20, 2023 11:05 AM Kendralyn S wrote: Reason for CRM: Macrobid  that was prescribed yesterday has been lost, needs replacement prescription called in

## 2023-12-21 ENCOUNTER — Other Ambulatory Visit (HOSPITAL_COMMUNITY): Payer: Self-pay

## 2023-12-21 ENCOUNTER — Other Ambulatory Visit: Payer: Self-pay

## 2023-12-21 ENCOUNTER — Telehealth: Payer: Self-pay

## 2023-12-21 ENCOUNTER — Ambulatory Visit: Payer: Self-pay | Admitting: Physician Assistant

## 2023-12-21 ENCOUNTER — Encounter: Payer: Self-pay | Admitting: Physician Assistant

## 2023-12-21 DIAGNOSIS — M810 Age-related osteoporosis without current pathological fracture: Secondary | ICD-10-CM

## 2023-12-21 MED ORDER — DENOSUMAB-BBDZ 60 MG/ML ~~LOC~~ SOSY
60.0000 mg | PREFILLED_SYRINGE | Freq: Once | SUBCUTANEOUS | 0 refills | Status: AC
Start: 2023-12-21 — End: 2023-12-25
  Filled 2023-12-21: qty 1, 1d supply, fill #0
  Filled 2023-12-24: qty 1, 180d supply, fill #0

## 2023-12-21 NOTE — Telephone Encounter (Signed)
 Attempted call to patient. Left a detailed voicemail message that Jubbonti is now preferred and has $0 copay. Requesting a return call to let us  know that she would like us  to proceed with sending the prescription for Jubbonti to the pharmacy for her.  Awaiting a return call form patient .

## 2023-12-21 NOTE — Telephone Encounter (Signed)
 Spoke with patient. She is agreeable to the Jubbonti-  order placed to receive from [facility].

## 2023-12-21 NOTE — Telephone Encounter (Signed)
 Spoke with patient - she had been by pharmacy and was told that they did not have the prescription for her.   I called the pharmacy - the pharmacist states that they did have the prescription and will get t his ready for the patient for pick up. The cost is $15.89.  Patient informed as above.

## 2023-12-21 NOTE — Telephone Encounter (Signed)
 Lemond Sauers the spouse of the patient called in returning a call to [place]. I spoke with Luke and she had me transfer him to her for further assistance.

## 2023-12-21 NOTE — Telephone Encounter (Signed)
 Patient's insurance no longer covers Prolia . Plan prefers Jubbonti. Test claim goes through without a PA for $0. Please send a new prescription for Jubbonti to [facility]. Thanks!

## 2023-12-21 NOTE — Telephone Encounter (Signed)
 Left detailed voice mail message on listed home # ( allowed on DPR )

## 2023-12-21 NOTE — Progress Notes (Signed)
 Jo,   No concerning WBC elevation.  Hemoglobin stable.  CRP for inflammation is elevated as well as ferritin.  Thyroid  and lipase normal.   No acute findings.   How is mental status, improving?

## 2023-12-21 NOTE — Addendum Note (Signed)
 Addended by: Keirstyn Aydt P on: 12/21/2023 12:46 PM   Modules accepted: Orders

## 2023-12-22 LAB — URINE CULTURE

## 2023-12-24 ENCOUNTER — Other Ambulatory Visit: Payer: Self-pay

## 2023-12-24 ENCOUNTER — Other Ambulatory Visit: Payer: Self-pay | Admitting: Pharmacy Technician

## 2023-12-24 NOTE — Progress Notes (Signed)
 Klebsiella pneumoniae detected and sensitive to macrobid . Symptoms should be improving.

## 2023-12-24 NOTE — Progress Notes (Signed)
 Specialty Pharmacy Refill Coordination Note  Amanda [name] is a 67 y.o. female assessed today regarding refills of clinic administered specialty medication(s) Denosumab -bernett BARTON)   Clinic requested Courier to Provider Office   Delivery date: 12/25/23   Verified address: [facility]   Medication will be filled on: 12/24/23

## 2023-12-25 ENCOUNTER — Other Ambulatory Visit (HOSPITAL_COMMUNITY): Payer: Self-pay

## 2023-12-26 NOTE — Telephone Encounter (Signed)
 Patient chart shows scheduled for Jubbonti  administration on 12/31/2023 - scheduled by Parthenia Nap.

## 2023-12-27 ENCOUNTER — Ambulatory Visit: Admitting: Physician Assistant

## 2023-12-27 ENCOUNTER — Encounter: Payer: Self-pay | Admitting: Physician Assistant

## 2023-12-27 ENCOUNTER — Other Ambulatory Visit (HOSPITAL_COMMUNITY): Payer: Self-pay

## 2023-12-27 VITALS — BP 90/50 | HR 81 | Ht 66.0 in | Wt 146.1 lb

## 2023-12-27 DIAGNOSIS — Z9884 Bariatric surgery status: Secondary | ICD-10-CM

## 2023-12-27 DIAGNOSIS — R634 Abnormal weight loss: Secondary | ICD-10-CM

## 2023-12-27 DIAGNOSIS — K8681 Exocrine pancreatic insufficiency: Secondary | ICD-10-CM

## 2023-12-27 DIAGNOSIS — D490 Neoplasm of unspecified behavior of digestive system: Secondary | ICD-10-CM | POA: Diagnosis not present

## 2023-12-27 MED ORDER — PANCRELIPASE (LIP-PROT-AMYL) 36000-114000 UNITS PO CPEP
ORAL_CAPSULE | ORAL | 5 refills | Status: AC
  Filled 2023-12-27: qty 300, 34d supply, fill #0

## 2023-12-27 NOTE — Progress Notes (Signed)
 Ellouise Console, PA-C [address] [place], [place]  [address] Phone: [phone]   Primary Care Physician: Antoniette Vermell [place], [place]  Primary Gastroenterologist:  Ellouise Console, PA-C / Norleen Kiang, MD   Chief Complaint: Follow-up diarrhea and weight loss.       HPI:   Discussed the use of AI scribe software for clinical note transcription with the patient, who gave verbal consent to proceed.  I last saw patient 11/08/2023 for evaluation of diarrhea and weight loss.  Incidental 1.1 cm cystic lesion in the pancreas tail thought to be IPMN - benign.  History of Roux-en-Y gastric bypass in 2014. - GI pathogen panel negative - Fecal pancreatic elastase was very low (18).  Started on Creon 36,000 lipase units, 2 with each meal along with each snack.  12/19/2023 labs by PCP: Normal iron panel and hemoglobin 12.4.  Anemia resolved.  Treated for Klebsiella UTI with Macrobid .  Normal CMP, TSH, free T4.  Normal lipase.  Elevated CRP 20.  11/12/2023 MRI abdomen with and without contrast (to follow-up pancreas lesion): 1. Multiple pancreatic cystic lesions, favored to represent combination of multiple pancreatic side branch IPMN and dilated side branches. No suspicious features. Follow-up examination with MRI abdomen with MRCP protocol in 1 year is recommended. 2. Otherwise essentially unremarkable exam, as described above.  History of Present Illness Joleah Kosak is a 68 year old female with pancreatic insufficiency who returns for follow-up of diarrhea and weight loss. old female with pancreatic insufficiency who returns for follow-up of diarrhea and weight loss.  She notes improvement in diarrhea but describes changes in stool consistency as 'larger clumps'. She is currently taking Creon, two with each meal and one with each snack which has helped her diarrhea.  She reports a decreased appetite and has been experiencing weight loss, with her weight dropping from 153 pounds on October 2nd to 146 pounds today. She mentions a past weight of 208 pounds when the symptoms began.  She discussed her history of being overweight and mentioned that she would not mind losing a little more weight.  She is not eating 3 meals a day.  She typically eats a lean cuisine meal twice per day.  Not eating much.  She is taking a bariatric multivitamin, potassium, and calcium  supplements. She reports good energy levels and adequate hydration.  GI history:  She was hospitalized April 2025 for anastomotic ulcer from Roux-en-Y gastric bypass s/p emergent laparoscopic Arlyss patch repair of perforated marginal ulcer by Dr. Tanda 05/28/2023.  History of gastric bypass in 2012.  Has been treated with pantoprazole  40 mg daily.    09/26/2023 repeat EGD (to verify healing of ulcer):   - Z- line regular. - Roux- en- Y gastrojejunostomy with gastrojejunal anastomosis characterized by healthy appearing mucosa. Residual sutures noted, no ulcers appreciated. - Normal gastric pouch - Normal examined small bowel limb.  Biopsies negative for H. pylori.  Continue Protonix  indefinitely.  Avoid all NSAIDs.   05/2023 abdominal pelvic CT with contrast: 1. Postoperative change of Roux-en-Y gastric bypass. Free fluid andtiny locule of intraperitoneal gas about the gastro jejunalanastomosis in the left upper quadrant. Findings are concerning for anastomotic dehiscence. 2. Enteritis about the jejunum distal to the gastro jejunal anastomosis. 3. No pulmonary embolism or acute abnormality in the chest. 4. Hepatic steatosis. 5. 1.1 cm cystic lesion in the pancreatic tail. Most likely a benign IPMN or pseudocyst. Follow-up pancreatic protocol CT or MRI in 2 years is recommended. 6.  Aortic Atherosclerosis   09/2017 last colonoscopy by Dr. Kiang: One small  4 mm benign colon mucosal polyp removed from transverse colon.  Left-sided diverticulosis.  Biopsies negative for microscopic colitis.  Excellent prep.  10-year repeat (due 09/2027).   2008 colonoscopy: Normal.   PMH: Hypertension, diabetes type 2, obesity, sleep  apnea, factor V Leiden deficiency, remote cholecystectomy and appendectomy.  History of fatty liver and Roux-en-Y gastric bypass (2014).    Current Outpatient Medications  Medication Sig Dispense Refill   acetaminophen  (TYLENOL ) 500 MG tablet Take 500 mg by mouth every 6 (six) hours as needed for moderate pain (pain score 4-6).     allopurinol  (ZYLOPRIM ) 100 MG tablet Take 1 tablet (100 mg total) by mouth daily. Needs appt 90 tablet 3   AMBULATORY NON FORMULARY MEDICATION Barislend 1 tablet by mouth once a day     atorvastatin  (LIPITOR) 40 MG tablet Take 1 tablet (40 mg total) by mouth daily. 90 tablet 3   Biotin 10000 MCG TABS Take 1 tablet by mouth daily.     calcium  citrate (CALCITRATE - DOSED IN MG ELEMENTAL CALCIUM ) 950 (200 Ca) MG tablet Take 200 mg of elemental calcium  by mouth daily.     Continuous Glucose Sensor (FREESTYLE LIBRE 14 DAY SENSOR) MISC Apply to upper deltoid every 14 days. Use reader to determine blood sugars 2 each 11   dapagliflozin  propanediol (FARXIGA ) 10 MG TABS tablet Take 1 tablet (10 mg total) by mouth daily. 90 tablet 1   estradiol  (ESTRACE  VAGINAL) 0.1 MG/GM vaginal cream Apply pea-sized amount over urethral opening daily. 42.5 g 1   fexofenadine (ALLEGRA) 180 MG tablet Take 180 mg by mouth daily as needed for allergies.     fluticasone (FLONASE) 50 MCG/ACT nasal spray Place 1 spray into both nostrils daily as needed for allergies.     losartan  (COZAAR ) 25 MG tablet Take 1 tablet (25 mg total) by mouth daily. 90 tablet 1   metFORMIN  (GLUCOPHAGE ) 500 MG tablet Take one tablet daily with a meal. 90 tablet 1   methenamine  (HIPREX ) 1 g tablet Take 1 tablet (1 g total) by mouth 2 (two) times daily with a meal. Start after the sulfa  tablet has been completed 60 tablet 11   methocarbamol  (ROBAXIN ) 500 MG tablet Take 1 tablet (500 mg total) by mouth 4 (four) times daily. 120 tablet 1   mirabegron  ER (MYRBETRIQ ) 50 MG TB24 tablet Take 1 tablet (50 mg total) by mouth daily.  90 tablet 1   Multiple Vitamin (MULTI-VITAMIN) tablet Take 1 tablet by mouth in the morning and at bedtime.     nitrofurantoin , macrocrystal-monohydrate, (MACROBID ) 100 MG capsule Take 1 capsule (100 mg total) by mouth 2 (two) times daily. 14 capsule 0   pantoprazole  (PROTONIX ) 40 MG tablet Take 1 tablet (40 mg total) by mouth 2 (two) times daily. 180 tablet 3   Potassium Chloride  ER 20 MEQ TBCR Take 1 tablet (20 mEq total) by mouth daily for 10 days. 10 tablet 0   spironolactone  (ALDACTONE ) 25 MG tablet Take 1 tablet (25 mg total) by mouth daily. 30 tablet 0   lipase/protease/amylase (CREON) 36000 UNITS CPEP capsule Take 2 capsules (72,000 Units total) by mouth 3 (three) times daily with meals AND 1 capsule (36,000 Units total) with snacks. Max 8 capsules daily. 300 capsule 5   Current Facility-Administered Medications  Medication Dose Route Frequency Provider Last Rate Last Admin   denosumab  (PROLIA ) injection 60 mg  60 mg Subcutaneous Once Breeback, Jade L, PA-C        Allergies as of 12/27/2023 -  Review Complete 12/27/2023  Allergen Reaction Noted   Fenofibrate Other (See Comments) 05/23/2010   Fosamax  [alendronate ] Other (See Comments) 06/09/2023   Ozempic  (0.25 or 0.5 mg-dose) [semaglutide (0.25 or 0.5mg -dos)] Other (See Comments) 02/17/2022   Sulfonamide derivatives Dermatitis and Other (See Comments) 11/21/2010    Past Medical History:  Diagnosis Date   Allergy    seasonal   Arthritis    all over   Blood dyscrasia    Carrys trait for Leiden Factor five never had any issues . Yhe only reason she was tested was because her mother had it   Diabetes mellitus without complication (HCC)    Pre surgery - Gastric sx 2014 Roun-Y no diabetes since  no meds   Fatty liver 05/2006   by US    Gout    Hip pain    Hypertension    Knee pain    Obesity    Pneumonia    Recurrent UTI    Sleep apnea    had roux en y lost 140lbs no longer needs cpap    Past Surgical History:  Procedure  Laterality Date   APPENDECTOMY  1977   CESAREAN SECTION  1989   one   CHOLECYSTECTOMY  1977   DILATION AND CURETTAGE OF UTERUS  1987 & 1988   LAPAROSCOPY N/A 05/27/2023   Procedure: LAPAROSCOPY DIAGNOSTIC, LAPAROSCOPIC GRAHAM PATCH REPAIR, LAPARSCOPIC PRIMARY INCISIONAL HERNIA REPAIR;  Surgeon: Tanda Locus, MD;  Location: [facility];  Service: General;  Laterality: N/A;   REDUCTION MAMMAPLASTY Bilateral 1985   ROUX-EN-Y GASTRIC BYPASS  2014   TOTAL KNEE ARTHROPLASTY Right 07/21/2019   Procedure: TOTAL KNEE ARTHROPLASTY;  Surgeon: Melodi Lerner, MD;  Location: [facility];  Service: Orthopedics;  Laterality: Right;     Review of Systems:    All systems reviewed and negative except where noted in HPI.    Physical Exam:  BP (!) 90/50   Pulse 81   Ht 5' 6 (1.676 m)   Wt 146 lb 2 oz (66.3 kg)   BMI 23.59 kg/m  No LMP recorded. Patient is postmenopausal.  General: Well-nourished, well-developed in no acute distress.  Lungs: Clear to auscultation bilaterally. Non-labored. Heart: Regular rate and rhythm, no murmurs rubs or gallops.  Abdomen: Bowel sounds are normal; Abdomen is Soft; No hepatosplenomegaly, masses or hernias;  No Abdominal Tenderness; No guarding or rebound tenderness. Neuro: Alert and oriented x 3.  Grossly intact.  Psych: Alert and cooperative, normal mood and affect.   Imaging Studies: No results found.  Labs: CBC    Component Value Date/Time   WBC 7.5 12/19/2023 1003   WBC 8.2 11/08/2023 1149   RBC 3.76 (L) 12/19/2023 1003   RBC 3.80 (L) 11/08/2023 1149   HGB 12.4 12/19/2023 1003   HCT 37.3 12/19/2023 1003   PLT 168 12/19/2023 1003   MCV 99 (H) 12/19/2023 1003   MCH 33.0 12/19/2023 1003   MCH 33.5 05/31/2023 0500   MCHC 33.2 12/19/2023 1003   MCHC 33.3 11/08/2023 1149   RDW 12.2 12/19/2023 1003   LYMPHSABS 2.8 12/19/2023 1003   MONOABS 0.6 11/08/2023 1149   EOSABS 0.1 12/19/2023 1003   BASOSABS 0.1 12/19/2023 1003    CMP     Component Value  Date/Time   NA 143 12/19/2023 1003   K 3.5 12/19/2023 1003   CL 102 12/19/2023 1003   CO2 27 12/19/2023 1003   GLUCOSE 111 (H) 12/19/2023 1003   GLUCOSE 103 (H) 11/08/2023 1149   BUN 16 12/19/2023  1003   CREATININE 0.96 12/19/2023 1003   CREATININE 0.93 02/13/2022 1032   CALCIUM  9.9 12/19/2023 1003   PROT 6.2 12/19/2023 1003   ALBUMIN 3.8 (L) 12/19/2023 1003   AST 17 12/19/2023 1003   ALT 16 12/19/2023 1003   ALKPHOS 70 12/19/2023 1003   BILITOT 1.0 12/19/2023 1003   GFRNONAA >60 05/31/2023 0500   GFRNONAA 61 02/12/2020 1031   GFRAA 71 02/12/2020 1031       Assessment and Plan:   KORA GROOM is a 68 y.o. y/o female returns for follow-up of multiple GI issues: Assessment & Plan 1.  Pancreatic exocrine insufficiency Confirmed by pancreatic elastase test.  Diarrhea has improved with Creon. No pancreatic cancer on MRI. - Continue Creon 36,000 lipase units with 2 capsules per meal and one per snack. - Continue multivitamins  2.  Benign pancreatic cysts: Likely IPMNs.  MRI shows benign cysts, no malignancy. - Repeat MRI in 1 year to monitor cysts.  3.  Weight loss: She has struggled with morbid obesity her entire life.  She has desired weight loss and underwent Roux-en-Y gastric bypass in 2014.  Not currently eating much.  Will continue to monitor. Weight decreased from 153 lbs to 146 lbs. BMI 23, normal. No further weight loss advised. - Encouraged three meals and two protein shakes per day. - Instructed to monitor weight closely. - Consider repeating colonoscopy if weight loss continues despite adequate intake. - I recommended patient schedule repeat colonoscopy, however she declined to schedule colonoscopy today.  She denies rectal bleeding.   Ellouise Console, PA-C  Follow up in 3 months with TG.

## 2023-12-27 NOTE — Progress Notes (Signed)
 Noted

## 2023-12-27 NOTE — Patient Instructions (Signed)
 _______________________________________________________  If your blood pressure at your visit was 140/90 or greater, please contact your primary care physician to follow up on this.  _______________________________________________________  If you are age 68 or older, your body mass index should be between 23-30. Your Body mass index is 23.59 kg/m. If this is out of the aforementioned range listed, please consider follow up with your Primary Care Provider.  If you are age 68 or younger, your body mass index should be between 19-25. Your Body mass index is 23.59 kg/m. If this is out of the aformentioned range listed, please consider follow up with your Primary Care Provider.   ________________________________________________________  The [facility] providers would like to encourage you to use MYCHART to communicate with providers for non-urgent requests or questions.  Due to long hold times on the telephone, sending your provider a message by [facility] may be a faster and more efficient way to get a response.  Please allow 48 business hours for a response.  Please remember that this is for non-urgent requests.  _______________________________________________________  [facility] is using a team-based approach to care.  Your team is made up of your doctor and two to three APPS. Our APPS (Nurse Practitioners and Physician Assistants) work with your physician to ensure care continuity for you. They are fully qualified to address your health concerns and develop a treatment plan. They communicate directly with your gastroenterologist to care for you. Seeing the Advanced Practice Practitioners on your physician's team can help you by facilitating care more promptly, often allowing for earlier appointments, access to diagnostic testing, procedures, and other specialty referrals.   We have sent the following medications to your pharmacy for you to pick up at your convenience:  Creon 

## 2023-12-28 MED ORDER — DENOSUMAB-BBDZ 60 MG/ML ~~LOC~~ SOSY
60.0000 mg | PREFILLED_SYRINGE | Freq: Once | SUBCUTANEOUS | Status: AC
  Administered 2023-12-31: 60 mg via SUBCUTANEOUS

## 2023-12-28 NOTE — Addendum Note (Signed)
 Addended by: Huzaifa Viney P on: 12/28/2023 12:51 PM   Modules accepted: Orders

## 2023-12-31 ENCOUNTER — Other Ambulatory Visit (HOSPITAL_COMMUNITY): Payer: Self-pay

## 2023-12-31 ENCOUNTER — Ambulatory Visit (INDEPENDENT_AMBULATORY_CARE_PROVIDER_SITE_OTHER)

## 2023-12-31 VITALS — BP 139/77 | HR 71 | Ht 66.0 in

## 2023-12-31 DIAGNOSIS — M810 Age-related osteoporosis without current pathological fracture: Secondary | ICD-10-CM | POA: Diagnosis not present

## 2023-12-31 MED ORDER — DENOSUMAB-BBDZ 60 MG/ML ~~LOC~~ SOSY: 60.0000 mg | PREFILLED_SYRINGE | Freq: Once | SUBCUTANEOUS | Status: AC

## 2023-12-31 NOTE — Patient Instructions (Signed)
 Return in 6 months and one day for next Jubbonti  injection as nurse visit.

## 2023-12-31 NOTE — Progress Notes (Signed)
   Established Patient Office Visit  Subjective   Patient ID: Amanda [name], female    DOB: 1955/03/18  Age: 68 y.o. MRN: [id number]  Chief Complaint  Patient presents with   Osteoporosis    Jubbonti  injection ( switch from Prolia  due to insurance coverage preference)  nurse visit    HPI  Osteoporosis - Jubbonti  injection ( switched from Prolia  due to insurance preference for coverage)  patient states she has been taking a calcium  supplement but not  a vitamin D .  She will pick up a Vit D3  as 25 mcg (per Dr. Alvan)  supplement and start this today.  Last lab work from 12/19/2023 shows normal kidney function and calcium  level.   ROS    Objective:     BP 139/77   Pulse 71   Ht 5' 6 (1.676 m)   SpO2 99%   BMI 23.59 kg/m    Physical Exam   No results found for any visits on 12/31/23.    The ASCVD Risk score (Arnett DK, et al., 2019) failed to calculate for the following reasons:   The valid total cholesterol range is 130 to 320 mg/dL    Assessment & Plan:  Administered Jubbonti - SQ Left arm. Patient tolerated injection well without complications. Patient will return in 6 months and one day for next Jubbonti  administration as nurse visit.  She will start OTC Vit D3 as 25mcg  and continue calcium  supplement. Problem List Items Addressed This Visit       Musculoskeletal and Integument   Osteoporosis - Primary   Relevant Medications   denosumab -bbdz (JUBBONTI ) injection 60 mg (Start on 06/30/2024 12:00 AM)    Return in about 26 weeks (around 06/30/2024) for next Jubbonti   injection as nurse visit. SABRA Suzen SHAUNNA Alpheus, LPN

## 2024-01-07 ENCOUNTER — Other Ambulatory Visit (HOSPITAL_COMMUNITY): Payer: Self-pay

## 2024-01-08 NOTE — Telephone Encounter (Signed)
 The discussion was if cognition did not improve would consider follow up with neurology. Do you not want to go back to where you were previously evaluated? You should be able to just make appt but I can send another referral if you like.

## 2024-01-08 NOTE — Telephone Encounter (Signed)
 Copied from CRM [id number]. Topic: Clinical - Medical Advice >> Jan 08, 2024 10:03 AM Dedra B wrote: Reason for CRM: Pt said she something is not right in her brain. She is unsure if it's adult ADHD or something else. She discussed the issue with her PCP at her last OV and was under the impression that she was setting up an appt with a neurologist.

## 2024-01-14 ENCOUNTER — Other Ambulatory Visit: Payer: Self-pay

## 2024-01-14 ENCOUNTER — Other Ambulatory Visit (HOSPITAL_COMMUNITY): Payer: Self-pay

## 2024-01-14 ENCOUNTER — Other Ambulatory Visit: Payer: Self-pay | Admitting: Physician Assistant

## 2024-01-14 DIAGNOSIS — M1A4720 Other secondary chronic gout, left ankle and foot, without tophus (tophi): Secondary | ICD-10-CM

## 2024-01-14 DIAGNOSIS — N3281 Overactive bladder: Secondary | ICD-10-CM

## 2024-01-14 DIAGNOSIS — E1165 Type 2 diabetes mellitus with hyperglycemia: Secondary | ICD-10-CM

## 2024-01-14 DIAGNOSIS — N3942 Incontinence without sensory awareness: Secondary | ICD-10-CM

## 2024-01-14 MED ORDER — ALLOPURINOL 100 MG PO TABS
100.0000 mg | ORAL_TABLET | Freq: Every day | ORAL | 1 refills | Status: AC
  Filled 2024-01-14: qty 90, 90d supply, fill #0

## 2024-01-14 MED ORDER — MIRABEGRON ER 50 MG PO TB24
50.0000 mg | ORAL_TABLET | Freq: Every day | ORAL | 1 refills | Status: AC
  Filled 2024-01-14: qty 90, 90d supply, fill #0

## 2024-01-14 MED ORDER — FREESTYLE LIBRE 14 DAY SENSOR MISC
1.0000 | 5 refills | Status: AC
  Filled 2024-01-14: qty 2, 28d supply, fill #0
  Filled 2024-02-04: qty 2, 28d supply, fill #1
  Filled 2024-02-20 – 2024-03-03 (×3): qty 2, 28d supply, fill #2

## 2024-01-15 ENCOUNTER — Other Ambulatory Visit: Payer: Self-pay

## 2024-01-15 ENCOUNTER — Other Ambulatory Visit (HOSPITAL_COMMUNITY): Payer: Self-pay

## 2024-01-16 ENCOUNTER — Other Ambulatory Visit: Payer: Self-pay | Admitting: Physician Assistant

## 2024-01-16 DIAGNOSIS — N6313 Unspecified lump in the right breast, lower outer quadrant: Secondary | ICD-10-CM | POA: Insufficient documentation

## 2024-01-16 NOTE — Progress Notes (Signed)
 Pt calls in with tender right breast lump in upper outer quadrant for the last week. It does not seem to be getting bigger. No redness or warmth. She is worried because she has not had mammogram in a while. No hx of BC. She would like referral for imaging.

## 2024-01-19 ENCOUNTER — Other Ambulatory Visit: Payer: Self-pay | Admitting: Physician Assistant

## 2024-01-19 DIAGNOSIS — I10 Essential (primary) hypertension: Secondary | ICD-10-CM

## 2024-01-24 ENCOUNTER — Other Ambulatory Visit: Payer: Self-pay | Admitting: Physician Assistant

## 2024-01-24 DIAGNOSIS — Z1231 Encounter for screening mammogram for malignant neoplasm of breast: Secondary | ICD-10-CM

## 2024-01-29 ENCOUNTER — Other Ambulatory Visit: Payer: Self-pay | Admitting: Physician Assistant

## 2024-01-29 DIAGNOSIS — I10 Essential (primary) hypertension: Secondary | ICD-10-CM

## 2024-01-29 NOTE — Telephone Encounter (Signed)
 Copied from CRM #[id number]. Topic: Clinical - Medication Refill >> Jan 29, 2024  4:17 PM Jasmin G wrote: Medication: spironolactone  (ALDACTONE ) 25 MG tablet  Has the patient contacted their pharmacy? Yes (Agent: If no, request that the patient contact the pharmacy for the refill. If patient does not wish to contact the pharmacy document the reason why and proceed with request.) (Agent: If yes, when and what did the pharmacy advise?)  This is the patient's preferred pharmacy:  [facility] [place] [address] [place] [address] Phone: [phone] Fax: [phone]  Is this the correct pharmacy for this prescription? Yes If no, delete pharmacy and type the correct one.   Has the prescription been filled recently? Yes  Is the patient out of the medication? No  Has the patient been seen for an appointment in the last year OR does the patient have an upcoming appointment? Yes  Can we respond through MyChart? No  Agent: Please be advised that Rx refills may take up to 3 business days. We ask that you follow-up with your pharmacy.

## 2024-01-31 ENCOUNTER — Encounter: Payer: Self-pay | Admitting: Physician Assistant

## 2024-02-04 ENCOUNTER — Other Ambulatory Visit (HOSPITAL_COMMUNITY): Payer: Self-pay

## 2024-02-05 ENCOUNTER — Other Ambulatory Visit: Payer: Self-pay | Admitting: Physician Assistant

## 2024-02-05 DIAGNOSIS — F02A3 Dementia in other diseases classified elsewhere, mild, with mood disturbance: Secondary | ICD-10-CM

## 2024-02-06 ENCOUNTER — Telehealth: Payer: Self-pay | Admitting: Neurology

## 2024-02-06 NOTE — Telephone Encounter (Signed)
 Pt called in this morning and she is upset because she stated that she is confused. Pt stated that she tries to drive places and she forget where she is going. Please call. Thanks

## 2024-02-06 NOTE — Telephone Encounter (Signed)
 Called patient and left a message for a call back.   Need more information on what has been going on.

## 2024-02-06 NOTE — Telephone Encounter (Signed)
 Called patient and informed her of Dr. Venus recommendations. Patient verbalized understanding and will contact her PCP. Patient will contact us  back if she needs to be seen by neurology. Patient verbalized understanding of all explained and had no further questions or concerns.

## 2024-02-06 NOTE — Telephone Encounter (Signed)
 Patient returned call and stated she has been so anxious and scared with what has been going on. Patient stated that she has been having moments that she cannot remember anything. She stated she couldn't remember where she was when she was driving her grandchildren to daycare which she frequently does. She states she has been getting lost a lot, even in [place] which she has lived for 20 years.   Patient has stopped driving long distances because of this and her husband has been driving her around.Nothing different has been going on the past month, no need medications, no falls, or no injury.  I informed patient that she was seen by Dr. Evonnie 01/11/23 for tremor and gait instability so this would be a new issue. I did inform her I will send this note to Dr. Evonnie for further recommendations. I did advise patient to seek emergency care if there are any worsening symptoms. Patient verbalized understanding and had no further questions or concerns.

## 2024-02-06 NOTE — Telephone Encounter (Signed)
 Patient returned call and informed me that she has some concerns with her memory. Then line disconnected.   Called patient back and left a message for a call back.

## 2024-02-15 ENCOUNTER — Ambulatory Visit
Admission: RE | Admit: 2024-02-15 | Discharge: 2024-02-15 | Disposition: A | Source: Ambulatory Visit | Attending: Physician Assistant | Admitting: Physician Assistant

## 2024-02-15 DIAGNOSIS — Z1231 Encounter for screening mammogram for malignant neoplasm of breast: Secondary | ICD-10-CM

## 2024-02-20 ENCOUNTER — Other Ambulatory Visit (HOSPITAL_COMMUNITY): Payer: Self-pay

## 2024-02-20 ENCOUNTER — Ambulatory Visit: Payer: Self-pay | Admitting: Physician Assistant

## 2024-02-20 NOTE — Progress Notes (Signed)
 Normal mammogram. Follow up in 1 year.

## 2024-03-01 ENCOUNTER — Other Ambulatory Visit (HOSPITAL_COMMUNITY): Payer: Self-pay

## 2024-03-03 ENCOUNTER — Other Ambulatory Visit (HOSPITAL_COMMUNITY): Payer: Self-pay

## 2024-04-04 ENCOUNTER — Ambulatory Visit: Admitting: Physician Assistant

## 2024-05-30 ENCOUNTER — Ambulatory Visit: Payer: Self-pay | Admitting: Neurology

## 2024-10-08 ENCOUNTER — Ambulatory Visit: Admitting: Urology
